# Patient Record
Sex: Male | Born: 1940 | Race: White | Hispanic: No | Marital: Married | Smoking: Former smoker
Health system: Southern US, Community
[De-identification: ages and names within clinical notes are randomized; demographics above are authoritative.]

## PROBLEM LIST (undated history)

## (undated) DIAGNOSIS — M25519 Pain in unspecified shoulder: Secondary | ICD-10-CM

## (undated) DIAGNOSIS — M549 Dorsalgia, unspecified: Secondary | ICD-10-CM

## (undated) DIAGNOSIS — M1990 Unspecified osteoarthritis, unspecified site: Secondary | ICD-10-CM

## (undated) DIAGNOSIS — K222 Esophageal obstruction: Secondary | ICD-10-CM

## (undated) DIAGNOSIS — C61 Malignant neoplasm of prostate: Secondary | ICD-10-CM

## (undated) DIAGNOSIS — M519 Unspecified thoracic, thoracolumbar and lumbosacral intervertebral disc disorder: Secondary | ICD-10-CM

## (undated) DIAGNOSIS — I10 Essential (primary) hypertension: Secondary | ICD-10-CM

## (undated) DIAGNOSIS — J42 Unspecified chronic bronchitis: Secondary | ICD-10-CM

## (undated) DIAGNOSIS — J189 Pneumonia, unspecified organism: Secondary | ICD-10-CM

## (undated) DIAGNOSIS — I739 Peripheral vascular disease, unspecified: Secondary | ICD-10-CM

## (undated) DIAGNOSIS — K922 Gastrointestinal hemorrhage, unspecified: Secondary | ICD-10-CM

## (undated) DIAGNOSIS — I442 Atrioventricular block, complete: Secondary | ICD-10-CM

## (undated) DIAGNOSIS — F32A Depression, unspecified: Secondary | ICD-10-CM

## (undated) DIAGNOSIS — M25551 Pain in right hip: Secondary | ICD-10-CM

## (undated) DIAGNOSIS — E785 Hyperlipidemia, unspecified: Secondary | ICD-10-CM

## (undated) DIAGNOSIS — R5383 Other fatigue: Secondary | ICD-10-CM

## (undated) DIAGNOSIS — I2510 Atherosclerotic heart disease of native coronary artery without angina pectoris: Secondary | ICD-10-CM

## (undated) DIAGNOSIS — Z87442 Personal history of urinary calculi: Secondary | ICD-10-CM

## (undated) DIAGNOSIS — F419 Anxiety disorder, unspecified: Secondary | ICD-10-CM

## (undated) DIAGNOSIS — F319 Bipolar disorder, unspecified: Secondary | ICD-10-CM

## (undated) DIAGNOSIS — K219 Gastro-esophageal reflux disease without esophagitis: Secondary | ICD-10-CM

## (undated) DIAGNOSIS — N529 Male erectile dysfunction, unspecified: Secondary | ICD-10-CM

## (undated) DIAGNOSIS — J8410 Pulmonary fibrosis, unspecified: Secondary | ICD-10-CM

## (undated) DIAGNOSIS — F329 Major depressive disorder, single episode, unspecified: Secondary | ICD-10-CM

## (undated) HISTORY — PX: CYSTOSCOPY: SUR368

## (undated) HISTORY — DX: Essential (primary) hypertension: I10

## (undated) HISTORY — DX: Gastrointestinal hemorrhage, unspecified: K92.2

## (undated) HISTORY — DX: Anxiety disorder, unspecified: F41.9

## (undated) HISTORY — DX: Male erectile dysfunction, unspecified: N52.9

## (undated) HISTORY — DX: Atrioventricular block, complete: I44.2

## (undated) HISTORY — DX: Dorsalgia, unspecified: M54.9

## (undated) HISTORY — DX: Unspecified thoracic, thoracolumbar and lumbosacral intervertebral disc disorder: M51.9

## (undated) HISTORY — PX: CORONARY ANGIOPLASTY: SHX604

## (undated) HISTORY — DX: Other fatigue: R53.83

## (undated) HISTORY — DX: Malignant neoplasm of prostate: C61

## (undated) HISTORY — DX: Gastro-esophageal reflux disease without esophagitis: K21.9

## (undated) HISTORY — DX: Hyperlipidemia, unspecified: E78.5

## (undated) HISTORY — DX: Unspecified osteoarthritis, unspecified site: M19.90

## (undated) HISTORY — DX: Pain in right hip: M25.551

## (undated) HISTORY — DX: Unspecified chronic bronchitis: J42

## (undated) HISTORY — DX: Depression, unspecified: F32.A

## (undated) HISTORY — DX: Atherosclerotic heart disease of native coronary artery without angina pectoris: I25.10

## (undated) HISTORY — PX: CORONARY ARTERY BYPASS GRAFT: SHX141

## (undated) HISTORY — DX: Esophageal obstruction: K22.2

## (undated) HISTORY — DX: Major depressive disorder, single episode, unspecified: F32.9

## (undated) HISTORY — DX: Pain in unspecified shoulder: M25.519

---

## 1991-05-26 DIAGNOSIS — J189 Pneumonia, unspecified organism: Secondary | ICD-10-CM

## 1991-05-26 HISTORY — DX: Pneumonia, unspecified organism: J18.9

## 1999-09-09 ENCOUNTER — Encounter: Payer: Self-pay | Admitting: Emergency Medicine

## 1999-09-09 ENCOUNTER — Inpatient Hospital Stay (HOSPITAL_COMMUNITY): Admission: EM | Admit: 1999-09-09 | Discharge: 1999-09-10 | Payer: Self-pay | Admitting: Emergency Medicine

## 2000-08-13 ENCOUNTER — Emergency Department (HOSPITAL_COMMUNITY): Admission: EM | Admit: 2000-08-13 | Discharge: 2000-08-13 | Payer: Self-pay | Admitting: Internal Medicine

## 2001-10-26 ENCOUNTER — Emergency Department (HOSPITAL_COMMUNITY): Admission: EM | Admit: 2001-10-26 | Discharge: 2001-10-26 | Payer: Self-pay | Admitting: Emergency Medicine

## 2001-10-26 ENCOUNTER — Encounter: Payer: Self-pay | Admitting: Emergency Medicine

## 2001-10-26 ENCOUNTER — Encounter (INDEPENDENT_AMBULATORY_CARE_PROVIDER_SITE_OTHER): Payer: Self-pay | Admitting: *Deleted

## 2004-01-15 ENCOUNTER — Encounter: Admission: RE | Admit: 2004-01-15 | Discharge: 2004-01-15 | Payer: Self-pay | Admitting: Internal Medicine

## 2005-01-05 ENCOUNTER — Ambulatory Visit: Payer: Self-pay | Admitting: Internal Medicine

## 2005-04-14 ENCOUNTER — Ambulatory Visit: Payer: Self-pay | Admitting: Internal Medicine

## 2005-08-18 ENCOUNTER — Ambulatory Visit: Payer: Self-pay | Admitting: Internal Medicine

## 2006-01-04 ENCOUNTER — Ambulatory Visit: Payer: Self-pay | Admitting: Internal Medicine

## 2006-01-04 LAB — CONVERTED CEMR LAB: PSA: 2.45 ng/mL

## 2006-01-12 ENCOUNTER — Ambulatory Visit: Payer: Self-pay | Admitting: Cardiology

## 2006-01-12 ENCOUNTER — Ambulatory Visit: Payer: Self-pay | Admitting: Internal Medicine

## 2006-01-12 ENCOUNTER — Encounter (INDEPENDENT_AMBULATORY_CARE_PROVIDER_SITE_OTHER): Payer: Self-pay | Admitting: *Deleted

## 2006-05-05 ENCOUNTER — Ambulatory Visit: Payer: Self-pay | Admitting: Internal Medicine

## 2006-05-11 ENCOUNTER — Ambulatory Visit: Payer: Self-pay | Admitting: Internal Medicine

## 2006-05-19 ENCOUNTER — Ambulatory Visit: Payer: Self-pay | Admitting: Internal Medicine

## 2007-02-28 ENCOUNTER — Encounter: Payer: Self-pay | Admitting: Internal Medicine

## 2007-02-28 DIAGNOSIS — Z87442 Personal history of urinary calculi: Secondary | ICD-10-CM

## 2007-02-28 DIAGNOSIS — F411 Generalized anxiety disorder: Secondary | ICD-10-CM | POA: Insufficient documentation

## 2007-02-28 DIAGNOSIS — I10 Essential (primary) hypertension: Secondary | ICD-10-CM

## 2007-02-28 DIAGNOSIS — J309 Allergic rhinitis, unspecified: Secondary | ICD-10-CM | POA: Insufficient documentation

## 2007-02-28 DIAGNOSIS — K219 Gastro-esophageal reflux disease without esophagitis: Secondary | ICD-10-CM | POA: Insufficient documentation

## 2007-02-28 DIAGNOSIS — M1990 Unspecified osteoarthritis, unspecified site: Secondary | ICD-10-CM | POA: Insufficient documentation

## 2007-02-28 DIAGNOSIS — F528 Other sexual dysfunction not due to a substance or known physiological condition: Secondary | ICD-10-CM

## 2007-02-28 DIAGNOSIS — E785 Hyperlipidemia, unspecified: Secondary | ICD-10-CM

## 2007-02-28 DIAGNOSIS — F329 Major depressive disorder, single episode, unspecified: Secondary | ICD-10-CM

## 2007-05-04 ENCOUNTER — Ambulatory Visit: Payer: Self-pay | Admitting: Internal Medicine

## 2007-05-04 ENCOUNTER — Encounter (INDEPENDENT_AMBULATORY_CARE_PROVIDER_SITE_OTHER): Payer: Self-pay | Admitting: *Deleted

## 2007-05-04 DIAGNOSIS — J4489 Other specified chronic obstructive pulmonary disease: Secondary | ICD-10-CM | POA: Insufficient documentation

## 2007-05-04 DIAGNOSIS — J449 Chronic obstructive pulmonary disease, unspecified: Secondary | ICD-10-CM

## 2007-05-04 DIAGNOSIS — M5137 Other intervertebral disc degeneration, lumbosacral region: Secondary | ICD-10-CM

## 2007-05-04 DIAGNOSIS — R5383 Other fatigue: Secondary | ICD-10-CM

## 2007-05-04 DIAGNOSIS — R5381 Other malaise: Secondary | ICD-10-CM

## 2007-05-05 ENCOUNTER — Encounter: Payer: Self-pay | Admitting: Internal Medicine

## 2007-05-05 DIAGNOSIS — R972 Elevated prostate specific antigen [PSA]: Secondary | ICD-10-CM | POA: Insufficient documentation

## 2007-05-09 ENCOUNTER — Encounter (INDEPENDENT_AMBULATORY_CARE_PROVIDER_SITE_OTHER): Payer: Self-pay | Admitting: *Deleted

## 2007-05-30 ENCOUNTER — Encounter: Payer: Self-pay | Admitting: Internal Medicine

## 2007-06-03 ENCOUNTER — Ambulatory Visit: Payer: Self-pay | Admitting: Internal Medicine

## 2007-06-08 ENCOUNTER — Encounter: Payer: Self-pay | Admitting: Internal Medicine

## 2007-06-08 ENCOUNTER — Ambulatory Visit: Payer: Self-pay

## 2007-06-20 ENCOUNTER — Ambulatory Visit: Payer: Self-pay | Admitting: Internal Medicine

## 2007-06-20 LAB — CONVERTED CEMR LAB
BUN: 8 mg/dL (ref 6–23)
Basophils Absolute: 0.1 10*3/uL (ref 0.0–0.1)
Basophils Relative: 1 % (ref 0.0–1.0)
CO2: 27 meq/L (ref 19–32)
Creatinine, Ser: 1.1 mg/dL (ref 0.4–1.5)
GFR calc Af Amer: 86 mL/min
HCT: 46.4 % (ref 39.0–52.0)
Hemoglobin: 15.6 g/dL (ref 13.0–17.0)
Lymphocytes Relative: 27 % (ref 12.0–46.0)
MCHC: 33.6 g/dL (ref 30.0–36.0)
Monocytes Absolute: 0.7 10*3/uL (ref 0.2–0.7)
Monocytes Relative: 12.2 % — ABNORMAL HIGH (ref 3.0–11.0)
Neutro Abs: 3.2 10*3/uL (ref 1.4–7.7)
Neutrophils Relative %: 56.1 % (ref 43.0–77.0)
Potassium: 4.2 meq/L (ref 3.5–5.1)
RDW: 12.4 % (ref 11.5–14.6)
Sodium: 141 meq/L (ref 135–145)

## 2007-06-22 ENCOUNTER — Inpatient Hospital Stay (HOSPITAL_BASED_OUTPATIENT_CLINIC_OR_DEPARTMENT_OTHER): Admission: RE | Admit: 2007-06-22 | Discharge: 2007-06-22 | Payer: Self-pay | Admitting: Cardiology

## 2007-06-22 ENCOUNTER — Ambulatory Visit: Payer: Self-pay | Admitting: Cardiology

## 2007-06-27 ENCOUNTER — Ambulatory Visit: Payer: Self-pay | Admitting: Internal Medicine

## 2007-08-15 ENCOUNTER — Ambulatory Visit: Payer: Self-pay | Admitting: Internal Medicine

## 2007-08-15 DIAGNOSIS — R079 Chest pain, unspecified: Secondary | ICD-10-CM | POA: Insufficient documentation

## 2007-08-18 ENCOUNTER — Encounter: Payer: Self-pay | Admitting: Internal Medicine

## 2007-08-26 ENCOUNTER — Ambulatory Visit: Payer: Self-pay | Admitting: Internal Medicine

## 2007-09-08 ENCOUNTER — Ambulatory Visit: Payer: Self-pay | Admitting: Internal Medicine

## 2007-09-08 LAB — CONVERTED CEMR LAB
ALT: 23 units/L (ref 0–53)
AST: 23 units/L (ref 0–37)
Cholesterol: 97 mg/dL (ref 0–200)
LDL Cholesterol: 50 mg/dL (ref 0–99)
VLDL: 18 mg/dL (ref 0–40)

## 2007-11-03 ENCOUNTER — Ambulatory Visit: Payer: Self-pay | Admitting: Internal Medicine

## 2007-11-03 LAB — CONVERTED CEMR LAB: PSA: 2.35 ng/mL (ref 0.10–4.00)

## 2008-01-31 ENCOUNTER — Ambulatory Visit: Payer: Self-pay | Admitting: Internal Medicine

## 2008-01-31 DIAGNOSIS — M25559 Pain in unspecified hip: Secondary | ICD-10-CM | POA: Insufficient documentation

## 2008-01-31 LAB — CONVERTED CEMR LAB
ALT: 22 units/L (ref 0–53)
AST: 22 units/L (ref 0–37)
Total CHOL/HDL Ratio: 3.4

## 2008-06-14 ENCOUNTER — Telehealth: Payer: Self-pay | Admitting: Internal Medicine

## 2008-06-18 ENCOUNTER — Ambulatory Visit: Payer: Self-pay | Admitting: Internal Medicine

## 2008-06-18 LAB — CONVERTED CEMR LAB
Alkaline Phosphatase: 70 units/L (ref 39–117)
Bilirubin, Direct: 0.1 mg/dL (ref 0.0–0.3)
Total Bilirubin: 1 mg/dL (ref 0.3–1.2)

## 2008-08-01 ENCOUNTER — Ambulatory Visit: Payer: Self-pay | Admitting: Internal Medicine

## 2008-08-01 DIAGNOSIS — M25519 Pain in unspecified shoulder: Secondary | ICD-10-CM | POA: Insufficient documentation

## 2008-08-01 DIAGNOSIS — IMO0002 Reserved for concepts with insufficient information to code with codable children: Secondary | ICD-10-CM | POA: Insufficient documentation

## 2008-08-01 DIAGNOSIS — M549 Dorsalgia, unspecified: Secondary | ICD-10-CM | POA: Insufficient documentation

## 2008-08-06 ENCOUNTER — Telehealth (INDEPENDENT_AMBULATORY_CARE_PROVIDER_SITE_OTHER): Payer: Self-pay | Admitting: *Deleted

## 2008-08-07 ENCOUNTER — Telehealth: Payer: Self-pay | Admitting: Internal Medicine

## 2008-08-08 ENCOUNTER — Encounter: Payer: Self-pay | Admitting: Internal Medicine

## 2008-10-05 ENCOUNTER — Ambulatory Visit: Payer: Self-pay | Admitting: Internal Medicine

## 2008-10-17 ENCOUNTER — Emergency Department (HOSPITAL_BASED_OUTPATIENT_CLINIC_OR_DEPARTMENT_OTHER): Admission: EM | Admit: 2008-10-17 | Discharge: 2008-10-17 | Payer: Self-pay | Admitting: Emergency Medicine

## 2008-10-17 ENCOUNTER — Telehealth: Payer: Self-pay | Admitting: Internal Medicine

## 2009-01-25 ENCOUNTER — Inpatient Hospital Stay (HOSPITAL_COMMUNITY): Admission: EM | Admit: 2009-01-25 | Discharge: 2009-01-27 | Payer: Self-pay | Admitting: Emergency Medicine

## 2009-01-25 ENCOUNTER — Ambulatory Visit: Payer: Self-pay | Admitting: Cardiovascular Disease

## 2009-01-26 ENCOUNTER — Encounter: Payer: Self-pay | Admitting: Cardiovascular Disease

## 2009-01-29 ENCOUNTER — Telehealth (INDEPENDENT_AMBULATORY_CARE_PROVIDER_SITE_OTHER): Payer: Self-pay | Admitting: *Deleted

## 2009-01-29 ENCOUNTER — Telehealth: Payer: Self-pay | Admitting: Internal Medicine

## 2009-01-30 ENCOUNTER — Ambulatory Visit: Payer: Self-pay | Admitting: Physician Assistant

## 2009-01-30 ENCOUNTER — Ambulatory Visit: Payer: Self-pay | Admitting: Internal Medicine

## 2009-01-30 DIAGNOSIS — K920 Hematemesis: Secondary | ICD-10-CM

## 2009-01-30 DIAGNOSIS — I2510 Atherosclerotic heart disease of native coronary artery without angina pectoris: Secondary | ICD-10-CM | POA: Insufficient documentation

## 2009-01-30 DIAGNOSIS — K922 Gastrointestinal hemorrhage, unspecified: Secondary | ICD-10-CM

## 2009-01-30 DIAGNOSIS — R112 Nausea with vomiting, unspecified: Secondary | ICD-10-CM

## 2009-01-30 LAB — CONVERTED CEMR LAB
Basophils Relative: 1 % (ref 0.0–3.0)
Eosinophils Relative: 3.2 % (ref 0.0–5.0)
HCT: 45.3 % (ref 39.0–52.0)
Hemoglobin: 15.5 g/dL (ref 13.0–17.0)
MCHC: 34.3 g/dL (ref 30.0–36.0)
MCV: 97 fL (ref 78.0–100.0)
Monocytes Absolute: 0.6 10*3/uL (ref 0.1–1.0)
Neutro Abs: 4.1 10*3/uL (ref 1.4–7.7)
Neutrophils Relative %: 68 % (ref 43.0–77.0)
RBC: 4.67 M/uL (ref 4.22–5.81)
WBC: 6.1 10*3/uL (ref 4.5–10.5)

## 2009-01-31 ENCOUNTER — Ambulatory Visit: Payer: Self-pay | Admitting: Internal Medicine

## 2009-02-07 DIAGNOSIS — Z9861 Coronary angioplasty status: Secondary | ICD-10-CM | POA: Insufficient documentation

## 2009-02-07 DIAGNOSIS — Z951 Presence of aortocoronary bypass graft: Secondary | ICD-10-CM

## 2009-02-08 ENCOUNTER — Ambulatory Visit: Payer: Self-pay | Admitting: Internal Medicine

## 2009-02-11 ENCOUNTER — Encounter: Payer: Self-pay | Admitting: Internal Medicine

## 2009-02-26 ENCOUNTER — Telehealth: Payer: Self-pay | Admitting: Internal Medicine

## 2009-03-04 ENCOUNTER — Ambulatory Visit: Payer: Self-pay | Admitting: Cardiovascular Disease

## 2009-03-04 ENCOUNTER — Encounter (INDEPENDENT_AMBULATORY_CARE_PROVIDER_SITE_OTHER): Payer: Self-pay

## 2009-03-04 DIAGNOSIS — I70219 Atherosclerosis of native arteries of extremities with intermittent claudication, unspecified extremity: Secondary | ICD-10-CM | POA: Insufficient documentation

## 2009-03-06 ENCOUNTER — Ambulatory Visit: Payer: Self-pay | Admitting: Cardiovascular Disease

## 2009-03-06 ENCOUNTER — Ambulatory Visit (HOSPITAL_COMMUNITY): Admission: RE | Admit: 2009-03-06 | Discharge: 2009-03-06 | Payer: Self-pay | Admitting: Cardiovascular Disease

## 2009-04-02 ENCOUNTER — Ambulatory Visit: Payer: Self-pay | Admitting: Internal Medicine

## 2009-04-02 DIAGNOSIS — K260 Acute duodenal ulcer with hemorrhage: Secondary | ICD-10-CM | POA: Insufficient documentation

## 2009-04-02 DIAGNOSIS — K210 Gastro-esophageal reflux disease with esophagitis: Secondary | ICD-10-CM

## 2009-04-15 ENCOUNTER — Ambulatory Visit: Payer: Self-pay | Admitting: Internal Medicine

## 2009-04-16 ENCOUNTER — Telehealth: Payer: Self-pay | Admitting: Internal Medicine

## 2009-05-03 ENCOUNTER — Ambulatory Visit: Payer: Self-pay | Admitting: Internal Medicine

## 2009-05-06 ENCOUNTER — Encounter: Payer: Self-pay | Admitting: Internal Medicine

## 2009-05-06 ENCOUNTER — Telehealth: Payer: Self-pay | Admitting: Internal Medicine

## 2009-05-06 LAB — CONVERTED CEMR LAB
LDL Cholesterol: 44 mg/dL (ref 0–99)
Total CHOL/HDL Ratio: 4

## 2009-06-14 ENCOUNTER — Encounter: Payer: Self-pay | Admitting: Internal Medicine

## 2009-06-18 ENCOUNTER — Encounter (INDEPENDENT_AMBULATORY_CARE_PROVIDER_SITE_OTHER): Payer: Self-pay | Admitting: *Deleted

## 2009-06-28 ENCOUNTER — Telehealth: Payer: Self-pay | Admitting: Internal Medicine

## 2009-07-08 ENCOUNTER — Telehealth (INDEPENDENT_AMBULATORY_CARE_PROVIDER_SITE_OTHER): Payer: Self-pay | Admitting: *Deleted

## 2009-09-20 ENCOUNTER — Ambulatory Visit: Payer: Self-pay | Admitting: Internal Medicine

## 2009-09-30 ENCOUNTER — Encounter: Payer: Self-pay | Admitting: Internal Medicine

## 2009-12-27 ENCOUNTER — Ambulatory Visit: Payer: Self-pay | Admitting: Internal Medicine

## 2009-12-30 LAB — CONVERTED CEMR LAB
Cholesterol: 94 mg/dL (ref 0–200)
HDL: 26 mg/dL — ABNORMAL LOW (ref >39.00)
VLDL: 18.6 mg/dL (ref 0.0–40.0)

## 2010-03-10 ENCOUNTER — Encounter: Payer: Self-pay | Admitting: Internal Medicine

## 2010-03-11 ENCOUNTER — Ambulatory Visit: Payer: Self-pay | Admitting: Internal Medicine

## 2010-03-11 LAB — CONVERTED CEMR LAB
Albumin: 3.8 g/dL (ref 3.5–5.2)
Alkaline Phosphatase: 79 units/L (ref 39–117)
Basophils Absolute: 0.1 10*3/uL (ref 0.0–0.1)
Bilirubin, Direct: 0.1 mg/dL (ref 0.0–0.3)
CO2: 25 meq/L (ref 19–32)
Calcium: 9.1 mg/dL (ref 8.4–10.5)
Creatinine, Ser: 1.4 mg/dL (ref 0.4–1.5)
Eosinophils Absolute: 0.2 10*3/uL (ref 0.0–0.7)
Glucose, Bld: 82 mg/dL (ref 70–99)
HDL: 22.9 mg/dL — ABNORMAL LOW (ref >39.00)
Lymphocytes Relative: 22.4 % (ref 12.0–46.0)
MCHC: 34.1 g/dL (ref 30.0–36.0)
Neutro Abs: 5 10*3/uL (ref 1.4–7.7)
Neutrophils Relative %: 61.1 % (ref 43.0–77.0)
PSA: 1.47 ng/mL (ref 0.10–4.00)
RDW: 13 % (ref 11.5–14.6)
Specific Gravity, Urine: >=1.03 (ref 1.000–1.030)
Triglycerides: 183 mg/dL — ABNORMAL HIGH (ref 0.0–149.0)
Urine Glucose: NEGATIVE mg/dL
Urobilinogen, UA: 1 (ref 0.0–1.0)
VLDL: 36.6 mg/dL (ref 0.0–40.0)

## 2010-03-14 ENCOUNTER — Encounter: Payer: Self-pay | Admitting: Internal Medicine

## 2010-03-14 ENCOUNTER — Telehealth: Payer: Self-pay | Admitting: Internal Medicine

## 2010-03-17 ENCOUNTER — Telehealth: Payer: Self-pay | Admitting: Internal Medicine

## 2010-04-01 ENCOUNTER — Encounter (INDEPENDENT_AMBULATORY_CARE_PROVIDER_SITE_OTHER): Payer: Self-pay | Admitting: *Deleted

## 2010-06-22 LAB — CONVERTED CEMR LAB
ALT: 20 units/L (ref 0–53)
AST: 22 units/L (ref 0–37)
Albumin: 3.9 g/dL (ref 3.5–5.2)
BUN: 10 mg/dL (ref 6–23)
BUN: 14 mg/dL (ref 6–23)
Basophils Absolute: 0 10*3/uL (ref 0.0–0.1)
Bilirubin, Direct: 0.2 mg/dL (ref 0.0–0.3)
CO2: 25 meq/L (ref 19–32)
CO2: 25 meq/L (ref 19–32)
Calcium: 9.8 mg/dL (ref 8.4–10.5)
Calcium: 9.9 mg/dL (ref 8.4–10.5)
Chloride: 103 meq/L (ref 96–112)
Chloride: 108 meq/L (ref 96–112)
Cholesterol: 103 mg/dL (ref 0–200)
Creatinine, Ser: 1.1 mg/dL (ref 0.4–1.5)
Eosinophils Absolute: 0.2 10*3/uL (ref 0.0–0.6)
Eosinophils Absolute: 0.2 10*3/uL (ref 0.0–0.7)
Eosinophils Relative: 2.5 % (ref 0.0–5.0)
GFR calc non Af Amer: 71 mL/min
Glucose, Bld: 102 mg/dL — ABNORMAL HIGH (ref 70–99)
Glucose, Bld: 91 mg/dL (ref 70–99)
Glucose, Bld: 94 mg/dL (ref 70–99)
HCT: 44.8 % (ref 39.0–52.0)
HCT: 45.6 % (ref 39.0–52.0)
Hemoglobin: 15.3 g/dL (ref 13.0–17.0)
LDL Cholesterol: 101 mg/dL — ABNORMAL HIGH (ref 0–99)
Lymphocytes Relative: 23.9 % (ref 12.0–46.0)
Lymphs Abs: 1.6 10*3/uL (ref 0.7–4.0)
MCHC: 34.1 g/dL (ref 30.0–36.0)
MCV: 95 fL (ref 78.0–100.0)
Neutro Abs: 4.6 10*3/uL (ref 1.4–7.7)
Neutrophils Relative %: 65.8 % (ref 43.0–77.0)
Platelets: 220 10*3/uL (ref 150.0–400.0)
Platelets: 271 10*3/uL (ref 150–400)
Potassium: 4.1 meq/L (ref 3.5–5.1)
Prothrombin Time: 10.9 s (ref 9.1–11.7)
RBC: 4.81 M/uL (ref 4.22–5.81)
RDW: 11.9 % (ref 11.5–14.6)
RDW: 12.7 % (ref 11.5–14.6)
Sodium: 138 meq/L (ref 135–145)
Total Bilirubin: 0.8 mg/dL (ref 0.3–1.2)
Total CHOL/HDL Ratio: 5.8
Triglycerides: 114 mg/dL (ref 0.0–149.0)
Triglycerides: 120 mg/dL (ref 0–149)
VLDL: 24 mg/dL (ref 0–40)
WBC: 7.5 10*3/uL (ref 4.5–10.5)

## 2010-06-26 NOTE — Progress Notes (Signed)
Summary: REFILL  Phone Note Refill Request Message from:  Patient on June 28, 2009 1:16 PM  Refills Requested: Medication #1:  AMLODIPINE BESYLATE 5 MG TABS take one by mouth once daily  Medication #2:  LISINOPRIL 10 MG  TABS 1 by mouth qd  Medication #3:  SIMVASTATIN 40 MG TABS Take one tablet by mouth daily at bedtime SEND TO [facility] [phone] FAX [phone] NEED 90 DAY SUPPLY  Initial call taken by: Delsa Sale,  June 28, 2009 1:19 PM  Follow-up for Phone Call        Rx faxed to pharmacy Follow-up by: Lynden Ang,  June 28, 2009 1:43 PM    Prescriptions: SIMVASTATIN 40 MG TABS (SIMVASTATIN) Take one tablet by mouth daily at bedtime  #90 x 3   Entered by:   Lynden Ang   Authorized by:   Annye Rusk, MD, [facility]   Signed by:   Lynden Ang on 06/28/2009   Method used:   Faxed to ...       [facility] (mail-order)       [place], [place]  [id number]       Ph: [phone]       Fax: [phone]   RxID:   [id number] AMLODIPINE BESYLATE 5 MG TABS (AMLODIPINE BESYLATE) take one by mouth once daily  #90 x 3   Entered by:   Lynden Ang   Authorized by:   Annye Rusk, MD, [facility]   Signed by:   Lynden Ang on 06/28/2009   Method used:   Faxed to ...       [facility] (mail-order)       [place], [place]  [id number]       Ph: [phone]       Fax: [phone]   RxID:   [id number] LISINOPRIL 10 MG  TABS (LISINOPRIL) 1 by mouth qd  #90 x 3   Entered by:   Lynden Ang   Authorized by:   Annye Rusk, MD, [facility]   Signed by:   Lynden Ang on 06/28/2009   Method used:   Faxed to ...       [facility] (mail-order)       [address] [place], [place]  [id number]       Ph: [phone]       Fax: [phone]   RxID:   [id number]

## 2010-06-26 NOTE — Letter (Signed)
Summary: Pre Visit Letter Revised  [facility]  [place], [place] [address]   Phone: [phone]  Fax: [phone]        04/01/2010 MRN: [id number] HERCULES HASLER [address] [place],   [address]             Procedure Date:  05-23-10   Welcome to the Gastroenterology Division at [facility].    You are scheduled to see a nurse for your pre-procedure visit on 05-09-10 at 10:30a.m. on the 3rd floor at [facility], [place] [employer].  We ask that you try to arrive at our office 15 minutes prior to your appointment time to allow for check-in.  Please take a minute to review the attached form.  If you answer "Yes" to one or more of the questions on the first page, we ask that you call the person listed at your earliest opportunity.  If you answer "No" to all of the questions, please complete the rest of the form and bring it to your appointment.    Your nurse visit will consist of discussing your medical and surgical history, your immediate family medical history, and your medications.   If you are unable to list all of your medications on the form, please bring the medication bottles to your appointment and we will list them.  We will need to be aware of both prescribed and over the counter drugs.  We will need to know exact dosage information as well.    Please be prepared to read and sign documents such as consent forms, a financial agreement, and acknowledgement forms.  If necessary, and with your consent, a friend or relative is welcome to sit-in on the nurse visit with you.  Please bring your insurance card so that we may make a copy of it.  If your insurance requires a referral to see a specialist, please bring your referral form from your primary care physician.  No co-pay is required for this nurse visit.     If you cannot keep your appointment, please call [phone] to cancel or reschedule prior to your appointment date.  This allows  [place] the opportunity to schedule an appointment for another patient in need of care.    Thank you for choosing [facility] for your medical needs.  We appreciate the opportunity to care for you.  Please visit [place] at our website  to learn more about our practice.  Sincerely, The Gastroenterology Division

## 2010-06-26 NOTE — Progress Notes (Signed)
Summary: Records request from [place] for records received from [place] [facility] [place]. Request forwarded to [employer]. Valda Favia  July 08, 2009 2:48 PM

## 2010-06-26 NOTE — Progress Notes (Signed)
Summary: AUTHORIZATION  Phone Note From Other Clinic   Summary of Call: Pt was approved for Post vac med supply comp request. Auth # for CPT [id number] is [id number] Initial call taken by: Charlsie Quest, [place],  March 17, 2010 5:31 PM

## 2010-06-26 NOTE — Progress Notes (Signed)
Summary: Omeprazole  Phone Note Call from Patient Call back at Home Phone [phone]   Caller: wife, [name] Call For: Dr. Henrene Pastor Reason for Call: Refill Medication Summary of Call: Omeprazole... 90 day supply... [facility], fax- [phone] Initial call taken by: Lucien Mons,  June 28, 2009 1:27 PM  Follow-up for Phone Call        Left message on patients machine to call back.  Follow-up by: Bernita Buffy CMA Deborra Medina),  July 05, 2009 9:09 AM  Additional Follow-up for Phone Call Additional follow up Details #1::        Pt. called back and he now has new insurance plan which is [employer] and needs rx. faxed to the new place that was put on the message for a 90 day supply with 3 refills. Additional Follow-up by: Abel Presto RN,  July 05, 2009 9:35 AM    Additional Follow-up for Phone Call Additional follow up Details #2::    rx sent Follow-up by: Bernita Buffy CMA ([place]),  July 05, 2009 2:28 PM  New/Updated Medications: OMEPRAZOLE 40 MG CPDR (OMEPRAZOLE) take one by mouth once daily Prescriptions: OMEPRAZOLE 40 MG CPDR (OMEPRAZOLE) take one by mouth once daily  #90 x 3   Entered by:   Bernita Buffy CMA ([place])   Authorized by:   Irene Shipper MD   Signed by:   Bernita Buffy CMA ([place]) on 07/05/2009   Method used:   Faxed to ...       [facility] (mail-order)       [address] [place], [place]  [id number]       Ph: [phone]       Fax: [phone]   RxID:   [id number]

## 2010-06-26 NOTE — Progress Notes (Signed)
Summary: RX request  Phone Note Call from Patient Call back at Home Phone [phone]   Caller: Spouse Summary of Call: pt's spouse called requesting multiple refills fromMD. I advised spouse that pt was overdue to CPX, and after reviewwing med list there were medications that were removed or changed. pt agreed to schedule CPX with possible med fill prior. spouse transferred Initial call taken by: Crissie Sickles, [place],  June 28, 2009 2:49 PM

## 2010-06-26 NOTE — Letter (Signed)
Summary: Appointment - Reminder [place], [place]  [address] [place]   [place], [place] [address]   Phone: [phone]  Fax: [phone]     June 18, 2009 MRN: [id number]   [address] [place], [place]  [address]   Dear Mr. Barcus,  Our records indicate that it is time to schedule a follow-up appointment with Dr. Harrington Challenger. It is very important that we reach you to schedule this appointment. We look forward to participating in your health care needs. Please contact us at the number listed above at your earliest convenience to schedule your appointment.  If you are unable to make an appointment at this time, give [place] a call so we can update our records.   Sincerely,    Darnell Level [facility] Scheduling Team

## 2010-06-26 NOTE — Letter (Signed)
Summary: Generic Letter  [facility]   [place], [place] [address]   Phone: [phone]  Fax: [phone]    03/14/2010  [place],   [address]  Dear Mr. Fauver,       This letter for purposes of medical necessity, in that  you should have the Post-T-Vac device approved, due to the   fact of ongoing permanent organic erectile dysfunction, and  inability to correct with oral medications.      Sincerely,   Cathlean Cower MD  Appended Document: Generic Letter Faxed - see phone note

## 2010-06-26 NOTE — Assessment & Plan Note (Signed)
Summary: 6 month rov/sl   Referring Provider:  Dorris Carnes, MD Primary Provider:  Cathlean Cower, MD  CC:  check up.  History of Present Illness:  [name] is a 70 year old gentleman. He has a history of coronary artery disease.  I last saw the patient back in September of last year.  Last cardiac catheterization (11/2008) showed 100% left main RCA was diffusely diseased 50-60% proximal 50% mid. Vessel is occluded after the PDA, SVG RCA was occluded ostially, SVG to ramus was ectatic with 30% blockage in the midsection. SVG to diagonal was ectatic 40% lesion. Diagonal was patent. Upper branch had a 99% stenosis, but was a small vessel. There were left to right collaterals. Left subclavian tortuous. No flow-limiting plaque, LIMA to LAD was patent. LAD was small. Abdominal aortogram showed patent renal arteries. 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 Since seen, he denies chest pains, no palpitations.  No shortness of breath.  Current Medications (verified): 1)  Aspirin 81 Mg  Tabs (Aspirin) .Marland Kitchen.. 1 Tab Once Daily 2)  Lisinopril 10 Mg  Tabs (Lisinopril) .Marland Kitchen.. 1 By Mouth Qd 3)  Alprazolam 0.25 Mg  Tabs (Alprazolam) .Marland Kitchen.. 1 By Mouth Once Daily As Needed 4)  Fluticasone Propionate 50 Mcg/act  Susp (Fluticasone Propionate) .... 2 Spray/side Qd 5)  Simvastatin 40 Mg Tabs (Simvastatin) .... Take One Tablet By Mouth Daily At Bedtime 6)  Amlodipine Besylate 5 Mg Tabs (Amlodipine Besylate) .... Take One By Mouth Once Daily 7)  Fish Oil 1000 Mg Caps (Omega-3 Fatty Acids) .... Take One By Mouth Once Daily 8)  Vicodin 5-500 Mg Tabs (Hydrocodone-Acetaminophen) .... As Needed 9)  Celexa 20 Mg Tabs (Citalopram Hydrobromide) .Marland Kitchen.. 1 Tab Once Daily 10)  Omeprazole 40 Mg Cpdr (Omeprazole) .... Take One By Mouth Once Daily 11)  Niaspan 1000 Mg Cr-Tabs (Niacin (Antihyperlipidemic)) .... Take One Half A Tablet  Every Day For 2 Weeks Then 1 Tablet Every Day ( Take 1 47maspirin 30 Minutes Prior To Niaspan )  Allergies: No Known Drug Allergies  Past History:  Past medical, surgical, family and social histories (including risk factors) reviewed, and no changes noted (except as noted below).  Past Medical History: Reviewed history from 04/15/2009 and no changes required. 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 father died with MI No FH of Colon Cancer:  Social History: Reviewed history from 01/30/2009 and no changes required. Former Smoker Alcohol use-no Married Illicit Drug Use - no Patient does not get regular exercise.  Daily Caffeine Use 3 cups a day Occupation: retired married  Review of Systems  All systems reviewed.  Negative to the above  problem.  Vital Signs:  Patient profile:   70 year old male Height:      73 inches Weight:      231 pounds BMI:     30.59 Pulse rate:   60 / minute Resp:     14 per minute BP sitting:   142 / 87  (left arm)  Vitals Entered By: Burnett Kanaris (September 20, 2009 11:36 AM)  Physical Exam  Additional Exam:  Patient is in NAD HEENT:  Normocephalic, atraumatic. EOMI, PERRLA.  Neck: JVP is normal. No thyromegaly. No bruits.  Lungs: clear to auscultation. No rales no wheezes.  Heart: Regular rate and rhythm. Normal S1, S2. No S3.   No significant murmurs. PMI not displaced.  Abdomen:  Supple, nontender. Normal bowel sounds. No masses. No hepatomegaly.  Extremities:   Good distal pulses throughout. No lower extremity edema.  Musculoskeletal :moving all extremities.  Neuro:   alert and oriented x3.    Impression & Recommendations:  Problem # 1:  CORONARY ARTERY DISEASE (ICD-414.00) Doing well.  No chest pains.  Keep on same regimen.  Problem # 2:  HYPERLIPIDEMIA (CLT-198.4) Keep on current regimen.  F/u with labs. Orders: TLB-Lipid Panel (80061-LIPID) TLB-AST (SGOT) (84450-SGOT)  Problem # 3:  HYPERTENSION (ICD-401.9) Keep on same regimen.  Other Orders: EKG w/ Interpretation (93000) TLB-BMP (Basic Metabolic Panel-BMET) (24299-QSYHNPM)  Patient Instructions: 1)  Your physician recommends that you return for lab work in:lab work today...we will call you with results 2)  Your physician wants you to follow-up in: Dec 2011  You will receive a reminder letter in the mail two months in advance. If you don't receive a letter, please call our office to schedule the follow-up appointment. Prescriptions: ALPRAZOLAM 0.25 MG  TABS (ALPRAZOLAM) 1 by mouth once daily as needed  #20 x 0   Entered by:   Francetta Found, RN, BSN   Authorized by:   Annye Rusk, MD, [facility]   Signed by:   Francetta Found, RN, BSN on 09/20/2009   Method used:   Print then Give to Patient   RxID:    [id number]   Appended Document: 6 month rov/sl EKG:  NSR.  60 bpm.   First degree AV block  PR. 318 msec.  Occasional PAC, PVC.

## 2010-06-26 NOTE — Medication Information (Signed)
Summary: Vacuum Erection Device/Pos-T-Vac   Vacuum Erection Device/Pos-T-Vac   Imported By: Phillis Knack 03/15/2010 10:58:11  _____________________________________________________________________  External Attachment:    Type:   Image     Comment:   External Document

## 2010-06-26 NOTE — Assessment & Plan Note (Signed)
Summary: follow up to refill rx-lb   Vital Signs:  Patient profile:   70 year old male Height:      72 inches Weight:      239.13 pounds BMI:     32.55 O2 Sat:      95 % on Room air Temp:     97.8 degrees F oral Pulse rate:   62 / minute BP sitting:   112 / 68  (left arm) Cuff size:   large  Vitals Entered By: Shirlean Mylar Ewing CMA Deborra Medina) (March 11, 2010 2:48 PM)  O2 Flow:  Room air  CC: followup, refill/RE   Primary Care Provider:  Cathlean Cower, MD  CC:  followup and refill/RE.  History of Present Illness: overall doing well, Pt denies CP, worsening sob, doe, wheezing, orthopnea, pnd, worsening LE edema, palps, dizziness or syncope  Pt denies new neuro symptoms such as headache, facial or extremity weakness   Pt denies polydipsia, polyuria, or low sugar symptoms.  Overall good compliance with meds, trying to follow low chol, DM diet, wt stable, little excercise however .  Needs med refills. Anxeity stable , without worsening depressive symptoms, suidical ideation, panic.  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) 10)  Percutaneous Transluminal Coronary Angioplasty, Hx of  (ICD-V45.82) 11)  Severe Bilateral Iliac Disease  () 12)  Presyncopal  () 13)  Saphenous Vein Graft Angiography X3  () 14)  Gi Bleed  (ICD-578.9) 15)  Nausea and Vomiting  (ICD-787.01) 16)  Hematemesis  (ICD-578.0) 17)  Abrasion, Knee, Left  (ICD-916.0) 18)  Pain in Joint, Shoulder Region  (ICD-719.41) 19)  Chest Pain  (ICD-786.50) 20)  Back Pain  (ICD-724.5) 21)  Hip Pain, Right   (ICD-719.45) 22)  Chest Pain, Left  (ICD-786.50) 23)  Psa, Increased  (ICD-790.93) 24)  Disc Disease, Lumbar  (ICD-722.52) 25)  Special Screening Malignant Neoplasm of Prostate  (ICD-V76.44) 26)  Fatigue  (ICD-780.79) 27)  Preventive Health Care  (ICD-V70.0) 28)  COPD  (ICD-496) 29)  Renal Calculus, Hx of  (ICD-V13.01) 30)  Osteoarthritis  (ICD-715.90) 31)  Depression  (ICD-311) 32)  Anxiety  (ICD-300.00) 33)  Erectile Dysfunction  (ICD-302.72) 34)  Gerd  (ICD-530.81) 35)  Allergic Rhinitis  (ICD-477.9)  Medications Prior to Update: 1)  Aspirin 81 Mg  Tabs (Aspirin) .Marland Kitchen.. 1 Tab Once Daily 2)  Lisinopril 10 Mg  Tabs (Lisinopril) .Marland Kitchen.. 1 By Mouth Qd 3)  Alprazolam 0.25 Mg  Tabs (Alprazolam) .Marland Kitchen.. 1 By Mouth Once Daily As Needed 4)  Fluticasone Propionate 50 Mcg/act  Susp (Fluticasone Propionate) .... 2 Spray/side Qd 5)  Simvastatin 40 Mg Tabs (Simvastatin) .... Take One Tablet By Mouth Daily At Bedtime 6)  Amlodipine Besylate 5 Mg Tabs (Amlodipine Besylate) .... Take One By Mouth Once Daily 7)  Fish Oil 1000 Mg Caps (Omega-3 Fatty Acids) .... Take One By Mouth Once Daily 8)  Vicodin 5-500 Mg Tabs (Hydrocodone-Acetaminophen) .... As Needed 9)  Celexa 20 Mg Tabs (Citalopram Hydrobromide) .Marland Kitchen.. 1 Tab Once Daily 10)  Omeprazole 40 Mg Cpdr (Omeprazole) .... Take One By Mouth Once Daily 11)  Niaspan 1000 Mg Cr-Tabs (Niacin (Antihyperlipidemic)) .... Take One and One Half Tabs Every Day ( Take 1 40m Aspirin 30 Minutes Prior To Niaspan )  Current Medications (verified): 1)  Aspirin 81 Mg  Tabs (Aspirin) ..Marland Kitchen. 1 Tab Once Daily 2)  Lisinopril 10 Mg  Tabs (Lisinopril) ..Marland Kitchen. 1 By Mouth Qd 3)  Alprazolam 0.25 Mg  Tabs (Alprazolam) ..Marland Kitchen. 1 By Mouth Once Daily As Needed 4)  Fluticasone Propionate 50 Mcg/act  Susp (Fluticasone Propionate) .... 2 Spray/side Qd 5)  Simvastatin 40 Mg Tabs (Simvastatin) .... Take One Tablet By Mouth Daily At Bedtime 6)  Amlodipine Besylate 5 Mg Tabs (Amlodipine Besylate) ....  Take One By Mouth Once Daily 7)  Fish Oil 1000 Mg Caps (Omega-3 Fatty Acids) .... Take One By Mouth Once Daily 8)  Celexa 20 Mg Tabs (Citalopram Hydrobromide) ..Marland Kitchen. 1 Tab Once Daily 9)  Omeprazole 40 Mg Cpdr (Omeprazole) .... Take One By Mouth Once Daily  Allergies (verified): No Known Drug Allergies  Past History:  Past Surgical History: Last updated: 02/07/2009 Current Problems:  PERCUTANEOUS TRANSLUMINAL CORONARY ANGIOPLASTY, HX OF (ICD-V45.82) 06/03/2007 & 01/25/2009 CORONARY ARTERY BYPASS GRAFT, HX OF (ICD-V45.81) 1994  Family History: Last updated: 0October 05, 2010father died with MI No FH of Colon Cancer:  Social History: Last updated: 010/05/10Former Smoker Alcohol use-no Married Illicit Drug Use - no Patient does not get regular exercise.  Daily Caffeine Use 3 cups a day Occupation: retired married  Risk Factors: Exercise: no (010-09-2008  Risk Factors: Smoking Status: quit (05/04/2007)  Past Medical History: Current Problems:  PVOT * PRESYNCOPAL GI BLEED (ICD-578.9) CORONARY ARTERY DISEASE (ICD-414.00) NAUSEA AND VOMITING (ICD-787.01) HEMATEMESIS (ICD-578.0) ABRASION, KNEE, LEFT (ICD-916.0) PAIN IN JOINT, SHOULDER REGION (ICD-719.41) CHEST PAIN (ICD-786.50) BACK PAIN (ICD-724.5) HIP PAIN, RIGHT (ICD-719.45) CHEST PAIN, LEFT (ICD-786.50) PSA, INCREASED (ICD-790.93) DISC DISEASE, LUMBAR (ICD-722.52) SPECIAL SCREENING MALIGNANT NEOPLASM OF PROSTATE (ICD-V76.44) FATIGUE (ICD-780.79) PREVENTIVE HEALTH CARE (ICD-V70.0) COPD (ICD-496) RENAL CALCULUS, HX OF (ICD-V13.01) OSTEOARTHRITIS (ICD-715.90) HYPERTENSION (ICD-401.9) DEPRESSION (ICD-311) ANXIETY (ICD-300.00) ERECTILE DYSFUNCTION (ICD-302.72) HYPERLIPIDEMIA (ICD-272.4) GERD CORONARY ARTERY DISEASE (ICD-414.00) ALLERGIC RHINITIS (ICD-477.9) ULCERATIVE ESOPHAGITIS PEPTIC STRICTURE Peptic Ulcer Disease  Review of Systems  The patient denies anorexia, fever, vision loss, decreased hearing,  hoarseness, chest pain, syncope, dyspnea on exertion, peripheral edema, prolonged cough, headaches, hemoptysis, abdominal pain, melena, hematochezia, severe indigestion/heartburn, hematuria, muscle weakness, suspicious skin lesions, transient blindness, difficulty walking, depression, unusual weight change, abnormal bleeding, enlarged lymph nodes, and angioedema.         all otherwise negative per pt -    Physical Exam  General:  Well developed, well nourished, in no acute distress. Head:  normocephalic and atraumatic Eyes:  PERRLA, no icterus. Ears:  R ear normal and L ear normal.   Nose:  No deformity, discharge,  or lesions. Mouth:  No deformity or lesions,  Neck:  JVP is normal  No bruits. Lungs:  normal respiratory effort and normal breath sounds.   Heart:  RRR.  S1, S2.  No S3.  No murmurs. Abdomen:  soft, non-tender, and normal bowel sounds.   Msk:  no joint tenderness and no joint swelling.   Extremities:  no edema, no erythema  Neurologic:  cranial nerves II-XII intact and strength normal in all extremities.   Skin:  color normal and no rashes.   Psych:  not depressed appearing and slightly anxious.     Impression & Recommendations:  Problem # 1:  Preventive Health Care (ICD-V70.0) Overall doing well, age appropriate education and counseling updated,  referral for preventive services and immunizations addressed, dietary counseling and smoking status adressed , most recent labs reviewed with pt, ecg declined, to order current labs Orders: Gastroenterology Referral (GI) TLB-BMP (Basic Metabolic Panel-BMET) (33354-TGYBWLS) TLB-CBC Platelet - w/Differential (85025-CBCD) TLB-Hepatic/Liver Function Pnl (80076-HEPATIC) TLB-Lipid Panel (80061-LIPID) TLB-PSA (Prostate Specific Antigen) (84153-PSA) TLB-TSH (Thyroid Stimulating Hormone) (84443-TSH) TLB-Udip ONLY (81003-UDIP)  Problem # 2:  ANXIETY (ICD-300.00)  His updated medication list for this problem includes:    Alprazolam  0.25 Mg Tabs (Alprazolam) .Marland Kitchen... 1 by mouth once daily as needed    Celexa 20 Mg Tabs (Citalopram hydrobromide) .Marland Kitchen... 1 tab once daily stable overall by hx and exam, ok to continue meds/tx as is   Complete Medication List: 1)  Aspirin 81 Mg Tabs (Aspirin) .Marland Kitchen.. 1 tab once daily 2)  Lisinopril 10 Mg Tabs (Lisinopril) .Marland Kitchen.. 1 by mouth qd 3)  Alprazolam 0.25 Mg Tabs (Alprazolam) .Marland Kitchen.. 1 by mouth once daily as needed 4)  Fluticasone Propionate 50 Mcg/act Susp (Fluticasone propionate) .... 2 spray/side qd 5)  Simvastatin 40 Mg Tabs (Simvastatin) .... Take one tablet by mouth daily at bedtime 6)  Amlodipine Besylate 5 Mg Tabs (Amlodipine besylate) .... Take one by mouth once daily 7)  Fish Oil 1000 Mg Caps (Omega-3 fatty acids) .... Take one by mouth once daily 8)  Celexa 20 Mg Tabs (Citalopram hydrobromide) .Marland Kitchen.. 1 tab once daily 9)  Omeprazole 40 Mg Cpdr (Omeprazole) .... Take one by mouth once daily  Other Orders: Admin 1st Vaccine [phone]) Flu Vaccine 24yr + ([phone] Tdap => 771yrIM (9(11572Admin of Any Addtl Vaccine (9(62035 Patient Instructions: 1)  you had the flu shot today and the tetanus shot today 2)  Please go to the Lab in the basement for your blood and/or urine tests today 3)  Please call the number on the [place] results of your testing  4)  Continue all previous medications as before this visit  5)  You will be contacted about the referral(s) to: colonoscopy after jan 1 with Dr PeHenrene Pastor)  Please schedule a follow-up appointment in 1 year or sooner if needed Prescriptions: ALPRAZOLAM 0.25 MG  TABS (ALPRAZOLAM) 1 by mouth once daily as needed  #90 x 0   Entered and Authorized by:   JaBiagio BorgD   Signed by:   JaBiagio BorgD on 03/11/2010   Method used:   Print then Give to Patient   RxID:   [id number]  Orders Added: 1)  Admin 1st Vaccine [90471] 2)  Flu Vaccine 3y71yr [90[32122]  Gastroenterology Referral [GI] 4)  Tdap => 74yr69yr [907[48250] Admin of Any  Addtl Vaccine [90472] 6)  TLB-BMP (Basic Metabolic Panel-BMET) [800[[id number]] TLB-CBC Platelet - w/Differential [85025-CBCD] 8)  TLB-Hepatic/Liver Function Pnl [80076-HEPATIC] 9)  TLB-Lipid Panel [80061-LIPID] 10)  TLB-PSA (Prostate Specific Antigen) [84153-PSA] 11)  TLB-TSH (Thyroid Stimulating Hormone) [84443-TSH] 12)  TLB-Udip ONLY [81003-UDIP] 13)  Est. Patient 65& > [993[45038]mmunizations Administered:  Tetanus Vaccine:    Vaccine Type: Tdap    Site: right deltoid    Mfr: GlaxoSmithKline    Dose: 0.5 ml    Route: IM    Given by: RobiShirlean Mylarng CMA (AAMAAspinwall Exp. Date: 03/13/2012    Lot #: [id number] given: 04/11/08 version given March 11, 2010.   Immunizations Administered:  Tetanus Vaccine:    Vaccine Type: Tdap    Site: right  deltoid    Mfr: GlaxoSmithKline    Dose: 0.5 ml    Route: IM    Given by: Shirlean Mylar Ewing CMA ([place])    Exp. Date: 03/13/2012    Lot #: [id number]    VIS given: 04/11/08 version given March 11, 2010. Flu Vaccine Consent Questions     Do you have a history of severe allergic reactions to this vaccine? no    Any prior history of allergic reactions to egg and/or gelatin? no    Do you have a sensitivity to the preservative Thimersol? no    Do you have a past history of Guillan-Barre Syndrome? no    Do you currently have an acute febrile illness? no    Have you ever had a severe reaction to latex? no    Vaccine information given and explained to patient? yes    Are you currently pregnant? no    Lot Number:[id number]   Exp Date:11/22/2010   Site Given  Left Deltoid IMbflu1

## 2010-06-26 NOTE — Miscellaneous (Signed)
  Clinical Lists Changes  Medications: Changed medication from NIASPAN 1000 MG CR-TABS (NIACIN (ANTIHYPERLIPIDEMIC)) Take one half a tablet every day for 2 weeks then 1 tablet every day ( Take 1 75mAspirin 30 minutes prior to Niaspan ) to NIASPAN 1000 MG CR-TABS (NIACIN (ANTIHYPERLIPIDEMIC)) Take one and one half tabs every day ( Take 1 863mAspirin 30 minutes prior to Niaspan ) - Signed Rx of NIASPAN 1000 MG CR-TABS (NIACIN (ANTIHYPERLIPIDEMIC)) Take one and one half tabs every day ( Take 1 8132mspirin 30 minutes prior to Niaspan );  #135 x 3;  Signed;  Entered by: JacFrancetta FoundN, BSN;  Authorized by: PauAnnye RuskD, FACGood Shepherd Rehabilitation HospitalMethod used: Faxed to [facility] , [place]  , Ph: [phone]: [phone] Prescriptions: NIASPAN 1000 MG CR-TABS (NIACIN (ANTIHYPERLIPIDEMIC)) Take one and one half tabs every day ( Take 1 22m77mpirin 30 minutes prior to Niaspan )  #135 x 3   Entered by:   JacqFrancetta Found, BSN   Authorized by:   PaulAnnye Rusk, FACCDominican Hospital-Santa Cruz/Soqueligned by:   JacqFrancetta Found, BSN on 09/30/2009   Method used:   Faxed to ...       [facility] (mail-order)             , [place]  [place]     Ph: [phone]   Fax: [phone]:   [phone]

## 2010-06-26 NOTE — Progress Notes (Signed)
Summary: DME AUTHORIZATION  Phone Note From Other Clinic   Caller: [employer] referral dept - [phone] Summary of Call: Dr Jenny Reichmann completed POS-T-Vac forms for male vacuum erection device. Per DME company, pt has an HMO and it requires authorization for this DME device. I called for authorization and it went to clinical review, a nurse will call office back for further info. OR we can fax info supporting the need for DME device to [phone] REF# [phone].  Can you give info supporting use of this device so I can submit to insurance?  Initial call taken by: Charlsie Quest, [place],  March 14, 2010 2:35 PM  Follow-up for Phone Call        OK to send list of problems I guess if that is enough documentation Follow-up by: Biagio Borg MD,  March 14, 2010 3:08 PM  Additional Follow-up for Phone Call Additional follow up Details #1::        No, I have submitted the diagnoses already, we would need a statement from you as to why he needs this device. OR why the other therapies like medication are not an option.  Additional Follow-up by: Charlsie Quest, [place],  March 14, 2010 4:39 PM    Additional Follow-up for Phone Call Additional follow up Details #2::    I did a letter - ok to print and send Follow-up by: Biagio Borg MD,  March 14, 2010 5:03 PM  Additional Follow-up for Phone Call Additional follow up Details #3:: Details for Additional Follow-up Action Taken: Faxed  Additional Follow-up by: Charlsie Quest, CMA,  March 14, 2010 5:21 PM

## 2010-06-26 NOTE — Medication Information (Signed)
Summary: Pantoprazole/[facility]  P[facility]oprazole/[facility]   Imported By: Phillis Knack 06/19/2009 14:52:11  _____________________________________________________________________  External Attachment:    Type:   Image     Comment:   External Document

## 2010-06-30 ENCOUNTER — Telehealth: Payer: Self-pay | Admitting: Internal Medicine

## 2010-07-08 ENCOUNTER — Telehealth: Payer: Self-pay | Admitting: Internal Medicine

## 2010-07-09 ENCOUNTER — Telehealth: Payer: Self-pay | Admitting: Internal Medicine

## 2010-07-10 NOTE — Progress Notes (Signed)
Summary: RFs - MAIL ORDER  Phone Note Call from Patient   Summary of Call: Patient is [facility] Simvastatin, Citalopram, omeprazole, amlodipine & lisinopril.  Initial call taken by: Charlsie Quest, [place],  June 30, 2010 2:25 PM  Follow-up for Phone Call        routine refills to robin Follow-up by: Biagio Borg MD,  June 30, 2010 4:42 PM    Prescriptions: OMEPRAZOLE 40 MG CPDR (OMEPRAZOLE) take one by mouth once daily  #90 x 2   Entered by:   Shirlean Mylar Ewing CMA ([place])   Authorized by:   Biagio Borg MD   Signed by:   Sharon Seller CMA ([place]) on 07/01/2010   Method used:   Faxed to ...       [facility] (mail-order)       [place]  [address]       Ph: [phone]       Fax: [phone]   RxID:   [id number] CELEXA 20 MG TABS (CITALOPRAM HYDROBROMIDE) 1 tab once daily  #90 x 2   Entered by:   Shirlean Mylar Ewing CMA ([place])   Authorized by:   Biagio Borg MD   Signed by:   Sharon Seller CMA ([place]) on 07/01/2010   Method used:   Faxed to ...       [facility] (mail-order)       [place]  [address]       Ph: [phone]       Fax: [phone]   RxID:   [id number] AMLODIPINE BESYLATE 5 MG TABS (AMLODIPINE BESYLATE) take one by mouth once daily  #90 x 2   Entered by:   Sharon Seller CMA ([place])   Authorized by:   Biagio Borg MD   Signed by:   Sharon Seller CMA ([place]) on 07/01/2010   Method used:   Faxed to ...       [facility] (mail-[place]  [address]       Ph: [phone]       Fax: [phone]   RxID:   [id number] SIMVASTATIN 40 MG TABS (SIMVASTATIN) Take one tablet by mouth daily at bedtime  #90 x 2   Entered by:   Sharon Seller CMA ([place])   Authorized by:   Biagio Borg MD   Signed by:   Sharon Seller CMA ([place]) on 07/01/2010   Method used:   Faxed to ...       [facility] (mail-order)       [place]  [address]       Ph: [phone]       Fax: [phone]   RxID:   [id number] LISINOPRIL 10 MG  TABS (LISINOPRIL) 1 by mouth qd  #90 x 2   Entered by:   Sharon Seller CMA ([place])   Authorized by:   Biagio Borg MD   Signed by:   Sharon Seller CMA ([place]) on 07/01/2010   Method used:   Faxed to ...       [facility] (mail-order)       [place]  [address]       Ph: [phone]       Fax: [phone]   RxID:   [id number]

## 2010-07-16 NOTE — Progress Notes (Signed)
Summary: RFS  Phone Note Call from Patient Call back at Home Phone [phone]   Summary of Call: Patient is requesting a call regarding prescription refills.  Initial call taken by: Charlsie Quest, [place],  July 09, 2010 12:32 PM  Follow-up for Phone Call        called pt left msg. to call back Follow-up by: Robin Ewing CMA Deborra Medina),  July 09, 2010 12:56 PM  Additional Follow-up for Phone Call Additional follow up Details #1::        pt left a message on triage to return his call please Additional Follow-up by: Denice [place],  July 09, 2010 1:19 PM    Additional Follow-up for Phone Call Additional follow up Details #2::    called pt back he states call couple days ago for refills on meds. Does not use [facility]. Need [facility]s to go to [facility]. Let pt know will resend meds to [facility] Follow-up by: Tomma Lightning RMA,  July 09, 2010 2:22 PM  Prescriptions: SIMVASTATIN 20 MG TABS (SIMVASTATIN) 1 by mouth once daily  #90 x 2   Entered by:   Tomma Lightning RMA   Authorized by:   Biagio Borg MD   Signed by:   Tomma Lightning RMA on 07/09/2010   Method used:   Faxed to ...       [facility] (mail-order)             , [place]         Ph: [phone]       Fax: [phone]   RxID:   [id number] OMEPRAZOLE 40 MG CPDR (OMEPRAZOLE) take one by mouth once daily  #90 x 2   Entered by:   Tomma Lightning RMA   Authorized by:   Biagio Borg MD   Signed by:   Tomma Lightning RMA on 07/09/2010   Method used:   Faxed to ...       [facility] (mail-order)             , [place]         Ph: [phone]       Fax: [phone]   RxID:   [id number] CELEXA 20 MG TABS (CITALOPRAM HYDROBROMIDE) 1 tab once daily  #90 x 2   Entered by:   Tomma Lightning RMA   Authorized by:   Biagio Borg MD   Signed by:   Tomma Lightning RMA on 07/09/2010   Method used:   Faxed to ...       [facility] (mail-order)             , [place]         Ph: [phone]       Fax: [phone]   RxID:   [id number] AMLODIPINE BESYLATE 5 MG TABS  (AMLODIPINE BESYLATE) take one by mouth once daily  #90 x 2   Entered by:   Tomma Lightning RMA   Authorized by:   Biagio Borg MD   Signed by:   Tomma Lightning RMA on 07/09/2010   Method used:   Faxed to ...       [facility] (mail-order)             , [place]         Ph: [phone]       Fax: [phone]   RxID:   [id number] SIMVASTATIN 40 MG TABS (SIMVASTATIN) Take one tablet by mouth daily at bedtime  #90 x 2   Entered by:  Tomma Lightning RMA   Authorized by:   Biagio Borg MD   Signed by:   Tomma Lightning RMA on 07/09/2010   Method used:   Faxed to ...       [facility] (mail-order)             , [place]         Ph: [phone]       Fax: [phone]   RxID:   [id number] LISINOPRIL 10 MG  TABS (LISINOPRIL) 1 by mouth qd  #90 x 2   Entered by:   Tomma Lightning RMA   Authorized by:   Biagio Borg MD   Signed by:   Tomma Lightning RMA on 07/09/2010   Method used:   Faxed to ...       [facility] (mail-order)             , [place]         Ph: [phone]       Fax: [phone]   RxID:   [id number]

## 2010-07-16 NOTE — Progress Notes (Signed)
Summary: Medication clarification  Phone Note From Pharmacy   Caller: [facility] Summary of Call: Pharmacy requesting clarification. Patient is on Celexa and Omeprazole, which interact, do you want pt. to continue on both medications?Also pt. is on simvastatin 40 and Amlodipine. Due to FDA warnings for dosage not to exceed simvastatin 41m when taking amlodipiine, advise please. Initial call taken by: Robin Ewing CMA (Deborra Medina,  July 08, 2010 3:52 PM  Follow-up for Phone Call        ok for all except decrease the simvastatin to 20 mg - to robin to handle Follow-up by: JBiagio BorgMD,  July 08, 2010 3:54 PM  Additional Follow-up for Phone Call Additional follow up Details #1::        [facility] in simvastatin [facility] informed of above information Additional Follow-up by: Robin Ewing CMA (AAMA),  July 08, 2010 4:19 PM    New/Updated Medications: SIMVASTATIN 20 MG TABS (SIMVASTATIN) 1 by mouth once daily Prescriptions: SIMVASTATIN 20 MG TABS (SIMVASTATIN) 1 by mouth once daily  #90 x 2   Entered by:   RoSharon SellerMA (AAMiddle [place]  Authorized by:   JaBiagio BorgD   Signed by:   RoSharon SellerMA (AAVersailleson 07/08/2010   Method used:   Faxed to ...       [facility] ([facility])       [address] [place]  [address]     Ph: [phone]     Fax: [phone] RxID:   [id number]

## 2010-08-01 ENCOUNTER — Encounter: Payer: Self-pay | Admitting: Internal Medicine

## 2010-08-21 NOTE — Letter (Signed)
Summary: [facility]   Imported By: Rise Patience 08/11/2010 15:32:05  _____________________________________________________________________  External Attachment:    Type:   Image     Comment:   External Document

## 2010-08-24 HISTORY — PX: NASAL SEPTUM SURGERY: SHX37

## 2010-08-29 LAB — PROTIME-INR: Prothrombin Time: 13.5 seconds (ref 11.6–15.2)

## 2010-08-29 LAB — LIPID PANEL
HDL: 27 mg/dL — ABNORMAL LOW (ref >39)
LDL Cholesterol: 95 mg/dL (ref 0–99)
Triglycerides: 134 mg/dL (ref <150)

## 2010-08-29 LAB — BASIC METABOLIC PANEL
BUN: 13 mg/dL (ref 6–23)
Calcium: 9.2 mg/dL (ref 8.4–10.5)
Chloride: 103 mEq/L (ref 96–112)
GFR calc Af Amer: >60 mL/min (ref >60)
GFR calc non Af Amer: >60 mL/min (ref >60)
GFR calc non Af Amer: >60 mL/min (ref >60)
Glucose, Bld: 143 mg/dL — ABNORMAL HIGH (ref 70–99)
Potassium: 3.5 mEq/L (ref 3.5–5.1)
Potassium: 3.9 mEq/L (ref 3.5–5.1)
Sodium: 135 mEq/L (ref 135–145)
Sodium: 138 mEq/L (ref 135–145)

## 2010-08-29 LAB — DIFFERENTIAL
Basophils Absolute: 0.1 10*3/uL (ref 0.0–0.1)
Basophils Relative: 1 % (ref 0–1)
Lymphocytes Relative: 25 % (ref 12–46)
Neutro Abs: 4 10*3/uL (ref 1.7–7.7)
Neutrophils Relative %: 61 % (ref 43–77)

## 2010-08-29 LAB — TSH: TSH: 1.34 u[IU]/mL (ref 0.350–4.500)

## 2010-08-29 LAB — CBC
HCT: 44.4 % (ref 39.0–52.0)
HCT: 45.4 % (ref 39.0–52.0)
HCT: 46.8 % (ref 39.0–52.0)
Hemoglobin: 15.3 g/dL (ref 13.0–17.0)
Hemoglobin: 15.6 g/dL (ref 13.0–17.0)
Platelets: 207 10*3/uL (ref 150–400)
RBC: 4.6 MIL/uL (ref 4.22–5.81)
RBC: 4.66 MIL/uL (ref 4.22–5.81)
RDW: 14.5 % (ref 11.5–15.5)
RDW: 14.5 % (ref 11.5–15.5)
WBC: 10.6 10*3/uL — ABNORMAL HIGH (ref 4.0–10.5)
WBC: 11.7 10*3/uL — ABNORMAL HIGH (ref 4.0–10.5)
WBC: 6.6 10*3/uL (ref 4.0–10.5)

## 2010-08-29 LAB — COMPREHENSIVE METABOLIC PANEL
ALT: 14 U/L (ref 0–53)
Alkaline Phosphatase: 65 U/L (ref 39–117)
BUN: 14 mg/dL (ref 6–23)
Chloride: 105 mEq/L (ref 96–112)
Glucose, Bld: 103 mg/dL — ABNORMAL HIGH (ref 70–99)
Potassium: 5.1 mEq/L (ref 3.5–5.1)
Total Bilirubin: 1.5 mg/dL — ABNORMAL HIGH (ref 0.3–1.2)

## 2010-08-29 LAB — GASTRIC OCCULT BLOOD (1-CARD TO LAB): Occult Blood, Gastric: POSITIVE — AB

## 2010-08-29 LAB — APTT: aPTT: 27 seconds (ref 24–37)

## 2010-08-29 LAB — CK TOTAL AND CKMB (NOT AT ARMC): Relative Index: 1.5 (ref 0.0–2.5)

## 2010-08-29 LAB — CARDIAC PANEL(CRET KIN+CKTOT+MB+TROPI)
CK, MB: 2.9 ng/mL (ref 0.3–4.0)
CK, MB: 3.6 ng/mL (ref 0.3–4.0)
Total CK: 70 U/L (ref 7–232)
Troponin I: 0.03 ng/mL (ref 0.00–0.06)

## 2010-09-04 ENCOUNTER — Telehealth: Payer: Self-pay | Admitting: Internal Medicine

## 2010-09-04 NOTE — Telephone Encounter (Signed)
12 lead faxed to Fran/Dr.Britt's Office @ [phone] 09/04/10/KM

## 2010-09-24 ENCOUNTER — Encounter: Payer: Self-pay | Admitting: Internal Medicine

## 2010-09-26 ENCOUNTER — Encounter: Payer: Self-pay | Admitting: Internal Medicine

## 2010-09-26 ENCOUNTER — Ambulatory Visit (INDEPENDENT_AMBULATORY_CARE_PROVIDER_SITE_OTHER): Payer: Medicare Other | Admitting: Internal Medicine

## 2010-09-26 VITALS — BP 127/81 | HR 61 | Ht 72.0 in | Wt 234.0 lb

## 2010-09-26 DIAGNOSIS — E785 Hyperlipidemia, unspecified: Secondary | ICD-10-CM

## 2010-09-26 DIAGNOSIS — I2510 Atherosclerotic heart disease of native coronary artery without angina pectoris: Secondary | ICD-10-CM

## 2010-09-26 DIAGNOSIS — I10 Essential (primary) hypertension: Secondary | ICD-10-CM

## 2010-09-26 NOTE — Progress Notes (Signed)
[name] is a 70 year old gentleman. He has a history of coronary artery disease.  I last saw the patient back in September of last year.  Last cardiac catheterization (11/2008) showed 100% left main RCA was diffusely diseased 50-60% proximal 50% mid. Vessel is occluded after the PDA, SVG RCA was occluded ostially, SVG to ramus was ectatic with 30% blockage in the midsection. SVG to diagonal was ectatic 40% lesion. Diagonal was patent. Upper branch had a 99% stenosis, but was a small vessel. There were left to right collaterals. Left subclavian tortuous. No flow-limiting plaque, LIMA to LAD was patent. LAD was small. Abdominal aortogram showed patent renal arteries. 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 I saw him last in clinic in Aprill 2011 Since seen he has done well.  No chest pain.  Had nose surgery.  Breathing better. 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 LAst lipids LDL was 49, HDL was 26. (8/11)  Allergies no known allergies  Current Outpatient Prescriptions  Medication Sig Dispense Refill  . ALPRAZolam (XANAX) 0.25 MG tablet Take 0.25 mg by mouth daily as needed.        Marland Kitchen amLODipine (NORVASC) 5 MG tablet Take 5 mg by mouth daily.        Marland Kitchen aspirin 81 MG tablet Take 81 mg by mouth daily.        . citalopram (CELEXA) 20 MG tablet Take 20 mg by mouth daily.        . fish oil-omega-3 fatty acids 1000 MG capsule Take 1 capsule by mouth daily.        Marland Kitchen lisinopril (PRINIVIL,ZESTRIL) 10 MG tablet Take 10 mg by mouth daily.        Marland Kitchen omeprazole (PRILOSEC) 40 MG capsule Take 40 mg by mouth daily.        . simvastatin (ZOCOR) 40 MG tablet Take 40 mg by mouth at bedtime.        Marland Kitchen DISCONTD: fluticasone (FLONASE) 50 MCG/ACT nasal spray 2 sprays by Nasal route daily.          Past Medical History  Diagnosis Date  . GI bleed   . CAD (coronary artery disease)   . Nausea and vomiting   .  Hematemesis   . Abrasion of knee, left   . Shoulder joint pain   . Chest pain     left  . Back pain   . Hip pain, right   . PSA (psoriatic arthritis)     increased  . Lumbar disc disease   . Malignant neoplasm of prostate   . Fatigue   . COPD (chronic obstructive pulmonary disease)   . Renal calculus   . Osteoarthritis   . HTN (hypertension)   . Depression   . Anxiety   . ED (erectile dysfunction)   . Hyperlipidemia   . GERD (gastroesophageal reflux disease)   . Allergic rhinitis   . Peptic stricture of esophagus     Past Surgical History  Procedure Date  . Coronary angioplasty 06/03/2007, 01/25/2009  . Coronary artery bypass graft     Family History  Problem Relation Age of Onset  . Heart attack Father   . Colon cancer Neg Hx     History   Social History  . Marital Status: Married    Spouse Name: N/A    Number of Children: N/A  . Years of Education: N/A  Occupational History  . Retired    Social History Main Topics  . Smoking status: Former Research scientist (life sciences)  . Smokeless tobacco: Not on file  . Alcohol Use: No  . Drug Use: No  . Sexually Active: Not on file   Other Topics Concern  . Not on file   Social History Narrative  . No narrative on file    Review of Systems:  All systems reviewed.  They are negative to the above problem except as previously stated.  Vital Signs: BP 127/81  Pulse 61  Ht 6' (1.829 m)  Wt 234 lb (106.142 kg)  BMI 31.74 kg/m2  Physical Exam  HEENT:  Normocephalic, atraumatic. EOMI, PERRLA.  Neck: JVP is normal. No thyromegaly. No bruits.  Lungs: clear to auscultation. No rales no wheezes.  Heart: Regular rate and rhythm. Normal S1, S2. No S3.   No significant murmurs. PMI not displaced.  Abdomen:  Supple, nontender. Normal bowel sounds. No masses. No hepatomegaly.  Extremities:   Good distal pulses throughout. No lower extremity edema.  Musculoskeletal :moving all extremities.  Neuro:   alert and oriented x3.  CN II-XII grossly  intact.  Sinus bradycardia.  60 bpm.  First degree AV block PR interval 326 msec.  T wave inversion V1-V6.  Assessment and Plan:

## 2010-09-26 NOTE — Patient Instructions (Signed)
Your physician wants you to follow-up in: January 2013 with Dr.Ross You will receive a reminder letter in the mail two months in advance. If you don't receive a letter, please call our office to schedule the follow-up appointment.

## 2010-09-27 DIAGNOSIS — E785 Hyperlipidemia, unspecified: Secondary | ICD-10-CM | POA: Insufficient documentation

## 2010-09-27 NOTE — Assessment & Plan Note (Signed)
Continue meds.

## 2010-09-27 NOTE — Assessment & Plan Note (Signed)
No symptoms of angina.  Follow.

## 2010-10-07 NOTE — Assessment & Plan Note (Signed)
[place]                            CARDIOLOGY OFFICE NOTE   Wayne Torres, Wayne Torres                       MRN:          [id number]  DATE:06/03/2007                            DOB:          11-06-1940    IDENTIFICATION:  Wayne Torres is a 70 year old gentleman with a history of  coronary artery disease.  He is followed by Cathlean Cower in [facility]. He is re-referred for continued cardiac care. Note, I actually  saw the patient in the past it looks like in 2001.  He is status post  bypass grafting in 1995.  Myoview scan in 2001 showed some basal  inferior ischemia EF of 54%.   Since being seen, he says his breathing is okay.  He denies chest pain.  He takes things slow because of right hip problems.  This is his  limiting factor.   CURRENT MEDICATIONS:  1. Include aspirin 325 daily.  2. Lisinopril 10.  3. Lexapro 20.  4. Simvastatin 40.  5. Fish oil.   ALLERGIES:  None.   PAST MEDICAL HISTORY:  1. Coronary artery disease, status post coronary artery bypass graft      in 1995. Last  Myoview scan as stated above in 2001.  2. Dyslipidemia.  3. Degenerative joint disease.   SOCIAL HISTORY:  The patient does not smoke.  He is married.   FAMILY HISTORY:  Father had MI at age 71, died. Positive for CVA in  grandfather.   REVIEW OF SYSTEMS:  All other systems reviewed; negative to the above  problem, except as noted.   PHYSICAL EXAM:  On exam the patient is in no acute distress.  Blood  pressure is 110/80, pulse is 52 and regular, weight 219.  HEENT:  Normocephalic, atraumatic.  EOMI.  PERRL.  Mucous membranes are moist.  NECK:  JVP is normal.  No thyromegaly.  No bruits.  No masses.  LUNGS:  Clear. Without rales or wheezes.  CARDIAC: Regular rate and rhythm, S1-S2.  No S3-S4 or significant  murmurs.  ABDOMEN:  Supple.  No masses.  Normal bowel sounds.  No hepatomegaly.  EXTREMITIES:  2+ distal pulses.  No lower extremity edema.  NEUROMUSCULAR EXAM:  moving all extremities.  Strength was not tested.   A 12-lead EKG:  Sinus bradycardia, 52 beats per minute, first-degree AV  block with a PR interval of 240 milliseconds.  T-wave inversion noted in  V2-V6, 2, 3 and F.  Left anterior fascicular block.  Note, he had the T-  wave changes in previous EKGs.   IMPRESSION:  1. Coronary artery disease. He is now 13 years post bypass. He should      have a Myoview scan given that he is fairly inactive with his      degenerative joint disease.  I will follow up on this and schedule      at his convenience.  2. Dyslipidemia.  Will need to follow up with lipid panel from Dr.      Jenny Reichmann and be back in touch with the patient.  Continue on current  regimen for right now. Note, he had been on Niaspan in the past and      said that did not help much.   I will set to see the patient back in 9 months. Be in touch with him  regarding his Myoview scan with his disease process.  Will also set up  for carotid ultrasound as well.  There were no audible bruits, but does  have significant sure significant disease elsewhere.     [name], MD, [facility]  Electronically Signed    PVR/MedQ  DD: 06/04/2007  DT: 06/04/2007  Job #: [id number]

## 2010-10-07 NOTE — Cardiovascular Report (Signed)
NAME:  Wayne Torres, Wayne Torres NO.:  [id number]   MEDICAL RECORD NO.:  [id number]          PATIENT TYPE:  OIB   LOCATION:  1962                         FACILITY:  [facility]   PHYSICIAN:  Loretha Brasil. Lia Foyer, MD, FACCDATE OF BIRTH:  09-Nov-1940   DATE OF PROCEDURE:  DATE OF DISCHARGE:                            CARDIAC CATHETERIZATION   INDICATIONS:  Wayne Torres is a 70 year old gentleman who previously  underwent revascularization surgery by Dr. Gwendolyn Lima, approximately  13 years ago.  The patient has had a recent Myoview which demonstrated  some inferobasal ischemia.  Dr. Harrington Challenger recommended medical therapy, but  the patient really wanted to proceed with a coronary arteriography.  He  was set up for such.  Risks, benefits, alternatives were discussed with  the patient in detail, and he consented to proceed.   PROCEDURE:  1. Left heart catheterization.  2. Selective coronary arteriography.  3. Selective left ventriculography.  4. Saphenous vein graft angiography.  5. Selective coronary arteriography.  6. Selective left internal mammary angiography.   DESCRIPTION OF PROCEDURE:  The patient was brought to the  catheterization laboratory, prepped and draped in the usual fashion.  Through an anterior puncture, the right femoral artery was entered, and  a 4-French sheath was placed.  Following this, views of the left and  right coronaries were obtained.  Minimal views were required.  Following  this, we used a left bypass catheter to engage the intermediate graft.  The  no-tow was used for the diagonal graft.  The right bypass was used  for the right bypass, and an internal mammary for the internal mammary  vessel.  Central aortic and left ventricular pressures were measured  with a pigtail.  Ventriculography was then performed in the RAO  projection.  There were no complications, and he was taken to the  holding area in satisfactory clinical condition.  I reviewed the  pictures with the patient on the table, and then subsequently with his  wife in the viewing room.   HEMODYNAMIC DATA:  1. Central aortic pressure 132/78, mean 100.  2. Left atrial pressure 148/4.  3. There was no gradient or pullback across aortic valve.   ANGIOGRAPHIC DATA:  1. Ventriculography was done in the RAO projection.  Overall systolic      function was preserved with an ejection fraction, in excess of      about 55%.  There was some mild inferobasal hypokinesis.  2. The left main has 99% narrowing, and then the LAD is totally      occluded.  Proximal circumflex is also occluded.  3. The right coronary artery demonstrates 70% proximal narrowing.  The      vessel was ectatic, and there was diffuse 70% narrowing leading      into the bend.  It provides a first posterior descending branch.  I      suspect there are two co-D PDAs, but the old films are unavailable      for review.  The posterolateral branch is totally occluded, after      the takeoff of the first PDA.  4. The saphenous vein graft to the PDA and PLA is a large patulous      graft, with an extensive ectasia.  It is diffusely irregular.  The      graft itself is greater than about 6 to 8 mm in size.  There is not      a specific area of focal narrowing.  Runoff as might be expected is      relatively slow.  5. The saphenous vein graft to the intermediate vessel has about 50%      to 60% area of proximal stenosis.  It does not appear to be      critical.  The vessel past this has a fair amount of ectasia.  It      leads into an intermediate vessel that is without critical      narrowing.  6. The saphenous vein graft to the diagonal is intact, also.  There is      probably 30% segmental plaquing proximally, then a very ectatic      graft.  7. The internal mammary to the LAD is widely patent.  The distal LAD      itself is somewhat small in caliber and appears to be probably      diffusely diseased.  There is about  40% narrowing, just after the      insertion.   CONCLUSION:  1. Preserved overall LV function with a small inferior basal wall      motion abnormality.  2. Continued patency of the left internal mammary, to a smaller      caliber LAD.  3. Continued patency of the saphenous vein graft to the diagonal.  4. Continued patency of saphenous vein graft to the intermediate, with      about 50% to 60% area of proximal narrowing.  5. Continued patency of the native right coronary artery to a first      PDA with diffuse disease is noted, and continued patency of a large      ectatic saphenous vein graft to the distal PDA, PLA system as noted      above.   RECOMMENDATIONS:  The patient remains asymptomatic, and therefore  continued medical therapy would be recommended.  All efforts and  prevention would be appropriate.  I have reviewed these films with the  patient and his family.      Loretha Brasil. Lia Foyer, MD, [facility]  Electronically Signed     TDS/MEDQ  D:  06/22/2007  T:  06/22/2007  Job:  [id number]   cc:   Loretha Brasil. Lia Foyer, MD, [facility]  [name], MD, [facility]  CV Laboratory  Patient Medical Record

## 2010-10-07 NOTE — Assessment & Plan Note (Signed)
[place]                            CARDIOLOGY OFFICE NOTE   Wayne, Torres                       MRN:          [id number]  DATE:06/27/2007                            DOB:          Jul 05, 1940    IDENTIFICATION:  [name] is a 70 year old gentleman with coronary  artery disease.  I just saw him in January, went ahead and scheduled a  Myoview scan, and this was done on January 14.  It showed very mild  distal inferior ischemia.  My recommendation was for medical therapy.  The patient had reflected and decided he wanted to have a heart  catheterization.  This was done then on January 28 by Dr. Addison Lank.   Overall LV function was preserved with small inferior basilar motion  abnormality.  LIMA was patent.  SVG was patent to diagonal.  SVG to  intermedius had about a 50-60% narrowing in its proximal portion.  SVG  to RCA to PDA was large with extensive ectasia.  In the distal vessel,  the PDA had diffuse disease.  Plan was for medical therapy.   Since discharge the patient has done okay from a cardiac standpoint.  Again he still has no chest pain.   CURRENT MEDICATIONS:  1. Aspirin 325 mg.  2. Lisinopril 10 mg.  3. Fish oil.  4. Simcor 20/1000 mg.  5. Lexapro 10 mg.   PHYSICAL EXAM:  The patient is in no distress.  Blood pressure is  124/65, pulse is 65 and regular, weight 219.  LUNGS:  Clear.  CARDIAC:  Regular rate and rhythm, S1 and S2, no S3, no murmurs.  ABDOMEN:  Benign.  EXTREMITIES:  No edema.  The right groin without bruit.   IMPRESSION:  1. Coronary artery disease appears to be stable.  Would continue on      current medications.  He can back down to 81 mg of aspirin.  2. Dyslipidemia.  Will need to have follow-up lipids in a couple of      months on Simcor.   Otherwise, I will set follow-up for the fall.  From a cardiac  standpoint, I think she would be okay to proceed with surgery if he  decides to have his hip worked  on, low-risk.   ADDENDUM:  Twelve-lead EKG:  Sinus rhythm, first-degree AV block, with  PR interval 258 msec.  T-wave inversion in the anterior lateral leads  unchanged.     [name], MD, [facility]  Electronically Signed    PVR/MedQ  DD: 06/27/2007  DT: 06/28/2007  Job #: [id number]

## 2010-10-10 NOTE — Discharge Summary (Signed)
[place]  Patient:    Wayne Torres, Wayne Torres                       MRN: [id number] Adm. Date:  [id number] Disc. Date: [id number] Attending:  Biagio Borg Dictator:   Helayne Seminole, P.A. CC:         Biagio Borg, M.D. [facility]                           Discharge Summary  DISCHARGE DIAGNOSES: 1. Hypertension. 2. Anxiety.  BRIEF HISTORY OF PRESENT ILLNESS:  Mr. Stalvey is a 70 year old white male who awoke around 2 a.m. complaining "hot and nervous". He stated his head was profusely sweating. He had no dyspnea. No chest pain. No heart palpitations. This was relieved with pacing and "calming himself down". Duration was approximately two hours. He has similar episodes that occur every two to three months. These occur at night. Duration ranges from 15 minutes to several hours. No precipitating factors. Typically relieved with pacing. He denies any external stressors.  ADMISSION LABORATORY DATA:  CK was 86, MB was 0.6. Troponin I was less than 0.03.  Twelve-lead EKG revealed a more prominent T wave inversion. Otherwise normal sinus rhythm.  HOSPITAL COURSE: #1 - HYPERTENSION:  The patient had significantly elevated blood pressures with systolic over 435. The patient has no previous history of hypertension. He is not currently on any antihypertensives. The patients blood pressure did correct with Xanax. Antihypertensives at discharge will be determined by Rejeana Brock. Vanessa Collingsworth, M.D.  #2 - HISTORY OF CORONARY ARTERY DISEASE, STATUS POST CABG IN 1995:  We did have cardiology see the patient. As noted, the patient ruled out for MI. Cardiology recommended a Cardiolite as an outpatient. This has been scheduled.  #3 - ANXIETY:  The patient describes an episode of anxiety, possibly panic disorder. The patient has had some response with Xanax. We are also checking a 24-urine for metanephrines and catecholamines and a thyroid profile is also pending. At this time,  no further workup is indicated. The patient will be discharged home to follow up with Biagio Borg, M.D. in the next week.  DISCHARGE MEDICATIONS: 1. Antihypertensive to be determined at discharge. 2. Xanax 0.25 mg every eight hours. We will give him 20 until he can see Biagio Borg, M.D. DD:  09/10/99 TD:  09/10/99 Job: [id number]

## 2010-11-18 ENCOUNTER — Encounter: Payer: Self-pay | Admitting: Internal Medicine

## 2010-11-18 ENCOUNTER — Ambulatory Visit (INDEPENDENT_AMBULATORY_CARE_PROVIDER_SITE_OTHER)
Admission: RE | Admit: 2010-11-18 | Discharge: 2010-11-18 | Disposition: A | Discharge status: Home / Self Care | Payer: Medicare Other | Source: Ambulatory Visit | Attending: Internal Medicine | Admitting: Internal Medicine

## 2010-11-18 ENCOUNTER — Other Ambulatory Visit: Payer: Self-pay

## 2010-11-18 ENCOUNTER — Ambulatory Visit (INDEPENDENT_AMBULATORY_CARE_PROVIDER_SITE_OTHER): Payer: Medicare Other | Admitting: Internal Medicine

## 2010-11-18 VITALS — BP 100/68 | HR 63 | Temp 97.5°F | Ht 72.0 in | Wt 234.5 lb

## 2010-11-18 DIAGNOSIS — M67439 Ganglion, unspecified wrist: Secondary | ICD-10-CM

## 2010-11-18 DIAGNOSIS — M25532 Pain in left wrist: Secondary | ICD-10-CM | POA: Insufficient documentation

## 2010-11-18 DIAGNOSIS — J329 Chronic sinusitis, unspecified: Secondary | ICD-10-CM | POA: Insufficient documentation

## 2010-11-18 DIAGNOSIS — M6740 Ganglion, unspecified site: Secondary | ICD-10-CM

## 2010-11-18 DIAGNOSIS — I10 Essential (primary) hypertension: Secondary | ICD-10-CM

## 2010-11-18 DIAGNOSIS — Z0000 Encounter for general adult medical examination without abnormal findings: Secondary | ICD-10-CM | POA: Insufficient documentation

## 2010-11-18 DIAGNOSIS — M25539 Pain in unspecified wrist: Secondary | ICD-10-CM

## 2010-11-18 DIAGNOSIS — J309 Allergic rhinitis, unspecified: Secondary | ICD-10-CM

## 2010-11-18 DIAGNOSIS — J019 Acute sinusitis, unspecified: Secondary | ICD-10-CM

## 2010-11-18 MED ORDER — AZITHROMYCIN 250 MG PO TABS: ORAL_TABLET | ORAL | Status: AC

## 2010-11-18 MED ORDER — ALPRAZOLAM 0.25 MG PO TABS: 0.2500 mg | ORAL_TABLET | Freq: Every day | ORAL | Status: DC | PRN

## 2010-11-18 MED ORDER — FEXOFENADINE HCL 180 MG PO TABS: 180.0000 mg | ORAL_TABLET | Freq: Every day | ORAL | Status: DC

## 2010-11-18 NOTE — Patient Instructions (Signed)
Take all new medications as prescribed Continue all other medications as before Please return in 4 mo with Lab testing done 3-5 days before

## 2010-11-19 NOTE — Telephone Encounter (Signed)
Rx faxed to pharmacy

## 2010-11-26 ENCOUNTER — Encounter: Payer: Self-pay | Admitting: Internal Medicine

## 2010-11-26 NOTE — Assessment & Plan Note (Signed)
Mild to mod, for antibx course,  to f/u any worsening symptoms or concerns 

## 2010-11-26 NOTE — Assessment & Plan Note (Signed)
Mild to mod, for allegra trial prn,  to f/u any worsening symptoms or concerns

## 2010-11-26 NOTE — Assessment & Plan Note (Signed)
?  DJD - for left wrist film,  to f/u any worsening symptoms or concerns

## 2010-11-26 NOTE — Assessment & Plan Note (Signed)
stable overall by hx and exam, most recent data reviewed with pt, and pt to continue medical treatment as before  BP Readings from Last 3 Encounters:  11/18/10 100/68  09/26/10 127/81  03/11/10 112/68

## 2010-11-26 NOTE — Progress Notes (Signed)
Subjective:    Patient ID: Wayne Torres, male    DOB: September 05, 1940, 70 y.o.   MRN: [id number]  HPI  Here with 3 days acute onset fever, facial pain, pressure, general weakness and malaise, and greenish d/c, with slight ST, but little to no cough and Pt denies chest pain, increased sob or doe, wheezing, orthopnea, PND, increased LE swelling, palpitations, dizziness or syncope.  Does have several wks ongoing nasal allergy symptoms with clear congestion, itch and sneeze, without fever, pain, ST, cough or wheezing.  Pt denies new neurological symptoms such as new headache, or facial or extremity weakness or numbness   Pt denies polydipsia, polyuria, Does also have left wrist with intermittent mild diffuse swelling and pain for several wks, but more recently with 2 wks onset local swelling to the wrist as well, tends to use the hands quite a bit for work. Past Medical History  Diagnosis Date  . GI bleed   . CAD (coronary artery disease)   . Nausea and vomiting   . Hematemesis   . Abrasion of knee, left   . Shoulder joint pain   . Chest pain     left  . Back pain   . Hip pain, right   . PSA (psoriatic arthritis)     increased  . Lumbar disc disease   . Malignant neoplasm of prostate   . Fatigue   . COPD (chronic obstructive pulmonary disease)   . Renal calculus   . Osteoarthritis   . HTN (hypertension)   . Depression   . Anxiety   . ED (erectile dysfunction)   . Hyperlipidemia   . GERD (gastroesophageal reflux disease)   . Allergic rhinitis   . Peptic stricture of esophagus    Past Surgical History  Procedure Date  . Coronary angioplasty 06/03/2007, 01/25/2009  . Coronary artery bypass graft     reports that he has quit smoking. He does not have any smokeless tobacco history on file. He reports that he does not drink alcohol or use illicit drugs. family history includes Heart attack in his father and Heart disease in his father.  There is no history of Colon cancer and Cancer. No  Known Allergies Current Outpatient Prescriptions on File Prior to Visit  Medication Sig Dispense Refill  . amLODipine (NORVASC) 5 MG tablet Take 5 mg by mouth daily.        Marland Kitchen aspirin 81 MG tablet Take 81 mg by mouth daily.        . fish oil-omega-3 fatty acids 1000 MG capsule Take 1 capsule by mouth daily.        Marland Kitchen lisinopril (PRINIVIL,ZESTRIL) 10 MG tablet Take 10 mg by mouth daily.        Marland Kitchen omeprazole (PRILOSEC) 40 MG capsule Take 40 mg by mouth daily.        . simvastatin (ZOCOR) 40 MG tablet Take 40 mg by mouth at bedtime.        . citalopram (CELEXA) 20 MG tablet Take 20 mg by mouth daily.         Review of Systems Review of Systems  Constitutional: Negative for diaphoresis and unexpected weight change.  HENT: Negative for drooling and tinnitus.   Eyes: Negative for photophobia and visual disturbance.  Respiratory: Negative for choking and stridor.   Gastrointestinal: Negative for vomiting and blood in stool.  Genitourinary: Negative for hematuria and decreased urine volume.  Musculoskeletal: Negative for gait problem.     Objective:   Physical  Exam BP 100/68  Pulse 63  Temp(Src) 97.5 F (36.4 C) (Oral)  Ht 6' (1.829 m)  Wt 234 lb 8 oz (106.369 kg)  BMI 31.80 kg/m2  SpO2 95% Physical Exam  VS noted Constitutional: Pt appears well-developed and well-nourished.  HENT: Head: Normocephalic.  Right Ear: External ear normal.  Left Ear: External ear normal.  Bilat tm's mild erythema.  Sinus tender bilat.  Pharynx mild erythema Eyes: Conjunctivae and EOM are normal. Pupils are equal, round, and reactive to light.  Neck: Normal range of motion. Neck supple.  Cardiovascular: Normal rate and regular rhythm.   Pulmonary/Chest: Effort normal and breath sounds normal.  Abd:  Soft, NT, non-distended, + BS Neurological: Pt is alert. No cranial nerve deficit.  Skin: Skin is warm. No erythema.  Psychiatric: Pt behavior is normal. Thought content normal.  Left wrist with mild decreased  ROM, NT, no effusion, but does have approx 1 cm ant ganglion cyst, tender, mobile       Assessment & Plan:

## 2010-11-26 NOTE — Assessment & Plan Note (Addendum)
Small with pain - declines hand surgury referral, to f/u any worsening symptoms or concerns

## 2011-01-21 ENCOUNTER — Other Ambulatory Visit: Payer: Self-pay

## 2011-01-21 MED ORDER — AMLODIPINE BESYLATE 5 MG PO TABS: 5.0000 mg | ORAL_TABLET | Freq: Every day | ORAL | Status: DC

## 2011-01-21 MED ORDER — OMEPRAZOLE 40 MG PO CPDR: 40.0000 mg | DELAYED_RELEASE_CAPSULE | Freq: Every day | ORAL | Status: DC

## 2011-01-21 MED ORDER — CITALOPRAM HYDROBROMIDE 20 MG PO TABS: 20.0000 mg | ORAL_TABLET | Freq: Every day | ORAL | Status: DC

## 2011-02-24 ENCOUNTER — Telehealth: Payer: Self-pay | Admitting: Internal Medicine

## 2011-02-24 NOTE — Telephone Encounter (Signed)
Pt calling wanting to schedule stress test. Please return pt call to advise/discuss.

## 2011-02-24 NOTE — Telephone Encounter (Signed)
i called pt to discuss stress test--he states dr Harrington Challenger wanted him to have one but, i do not see anything mentioned in last o.v. Except to f/u in one year--please advise--thanks nt

## 2011-03-03 NOTE — Telephone Encounter (Signed)
Make sure patient has f/u (he may have been put in for 6 to 8 mon) Would review how he is doing before just setting up for stress test.

## 2011-03-03 NOTE — Telephone Encounter (Signed)
Patient aware that Dr. Harrington Challenger wants to see how he is doing then MD will decide whether of not he needs to have a stress test. The F/U visit is in January 2013, patient aware.

## 2011-03-20 ENCOUNTER — Encounter: Payer: Self-pay | Admitting: Internal Medicine

## 2011-03-20 ENCOUNTER — Other Ambulatory Visit (INDEPENDENT_AMBULATORY_CARE_PROVIDER_SITE_OTHER): Payer: Medicare Other

## 2011-03-20 ENCOUNTER — Ambulatory Visit (INDEPENDENT_AMBULATORY_CARE_PROVIDER_SITE_OTHER): Payer: Medicare Other | Admitting: Internal Medicine

## 2011-03-20 VITALS — BP 100/70 | HR 68 | Temp 98.4°F | Ht 72.0 in | Wt 238.5 lb

## 2011-03-20 DIAGNOSIS — Z0000 Encounter for general adult medical examination without abnormal findings: Secondary | ICD-10-CM

## 2011-03-20 DIAGNOSIS — R972 Elevated prostate specific antigen [PSA]: Secondary | ICD-10-CM

## 2011-03-20 DIAGNOSIS — E785 Hyperlipidemia, unspecified: Secondary | ICD-10-CM

## 2011-03-20 DIAGNOSIS — Z23 Encounter for immunization: Secondary | ICD-10-CM

## 2011-03-20 DIAGNOSIS — R5383 Other fatigue: Secondary | ICD-10-CM

## 2011-03-20 DIAGNOSIS — R5381 Other malaise: Secondary | ICD-10-CM

## 2011-03-20 DIAGNOSIS — I10 Essential (primary) hypertension: Secondary | ICD-10-CM

## 2011-03-20 LAB — CBC WITH DIFFERENTIAL/PLATELET
Basophils Absolute: 0 10*3/uL (ref 0.0–0.1)
Eosinophils Absolute: 0.3 10*3/uL (ref 0.0–0.7)
HCT: 46.5 % (ref 39.0–52.0)
Hemoglobin: 15.8 g/dL (ref 13.0–17.0)
Lymphs Abs: 2 10*3/uL (ref 0.7–4.0)
MCHC: 33.9 g/dL (ref 30.0–36.0)
Monocytes Absolute: 1 10*3/uL (ref 0.1–1.0)
Monocytes Relative: 11.8 % (ref 3.0–12.0)
Neutro Abs: 5.6 10*3/uL (ref 1.4–7.7)
Platelets: 265 10*3/uL (ref 150.0–400.0)
RDW: 13.4 % (ref 11.5–14.6)

## 2011-03-20 LAB — URINALYSIS, ROUTINE W REFLEX MICROSCOPIC
Nitrite: NEGATIVE
Specific Gravity, Urine: 1.025 (ref 1.000–1.030)
Total Protein, Urine: NEGATIVE
Urine Glucose: NEGATIVE

## 2011-03-20 LAB — HEPATIC FUNCTION PANEL
AST: 30 U/L (ref 0–37)
Albumin: 3.7 g/dL (ref 3.5–5.2)
Alkaline Phosphatase: 79 U/L (ref 39–117)
Bilirubin, Direct: 0.2 mg/dL (ref 0.0–0.3)

## 2011-03-20 LAB — LIPID PANEL
Total CHOL/HDL Ratio: 5
VLDL: 25 mg/dL (ref 0.0–40.0)

## 2011-03-20 LAB — TSH: TSH: 1.83 u[IU]/mL (ref 0.35–5.50)

## 2011-03-20 LAB — BASIC METABOLIC PANEL
CO2: 24 mEq/L (ref 19–32)
GFR: 83.55 mL/min (ref >60.00)
Glucose, Bld: 95 mg/dL (ref 70–99)
Potassium: 4.3 mEq/L (ref 3.5–5.1)
Sodium: 139 mEq/L (ref 135–145)

## 2011-03-20 MED ORDER — ALPRAZOLAM 0.25 MG PO TABS: 0.2500 mg | ORAL_TABLET | Freq: Every day | ORAL | Status: DC | PRN

## 2011-03-20 MED ORDER — CITALOPRAM HYDROBROMIDE 20 MG PO TABS: 20.0000 mg | ORAL_TABLET | Freq: Every day | ORAL | Status: DC

## 2011-03-20 MED ORDER — SIMVASTATIN 40 MG PO TABS: 40.0000 mg | ORAL_TABLET | Freq: Every day | ORAL | Status: DC

## 2011-03-20 MED ORDER — LISINOPRIL 10 MG PO TABS: 10.0000 mg | ORAL_TABLET | Freq: Every day | ORAL | Status: DC

## 2011-03-20 MED ORDER — OMEPRAZOLE 40 MG PO CPDR: 40.0000 mg | DELAYED_RELEASE_CAPSULE | Freq: Every day | ORAL | Status: DC

## 2011-03-20 MED ORDER — AMLODIPINE BESYLATE 5 MG PO TABS: 5.0000 mg | ORAL_TABLET | Freq: Every day | ORAL | Status: DC

## 2011-03-20 NOTE — Patient Instructions (Signed)
Continue all other medications as before You are given the xanax at the 90 days You will be contacted regarding the referral for: colonoscopy for late January 2013 Please go to LAB in the Basement for the blood and/or urine tests to be done today Please call the phone number [phone] (the [facility]) for results of testing in 2-3 days;  When calling, simply dial the number, and when prompted enter the MRN number above (the Medical Record Number) and the # key, then the message should start.

## 2011-03-21 ENCOUNTER — Encounter: Payer: Self-pay | Admitting: Internal Medicine

## 2011-03-21 NOTE — Progress Notes (Signed)
Subjective:    Patient ID: Wayne Torres, male    DOB: 1940/11/09, 70 y.o.   MRN: [id number]  HPI   Here to f/u; overall doing ok,  Pt denies chest pain, increased sob or doe, wheezing, orthopnea, PND, increased LE swelling, palpitations, dizziness or syncope.  Pt denies new neurological symptoms such as new headache, or facial or extremity weakness or numbness   Pt denies polydipsia, polyuria, or low sugar symptoms such as weakness or confusion improved with po intake.  Pt states overall good compliance with meds, trying to follow lower cholesterol diet, wt overall stable but little exercise however.  Does have sense of ongoing fatigue, but denies signficant hypersomnolence.   Pt denies fever, wt loss, night sweats, loss of appetite, or other constitutional symptoms Past Medical History  Diagnosis Date  . GI bleed   . CAD (coronary artery disease)   . Nausea and vomiting   . Hematemesis   . Abrasion of knee, left   . Shoulder joint pain   . Chest pain     left  . Back pain   . Hip pain, right   . PSA (psoriatic arthritis)     increased  . Lumbar disc disease   . Malignant neoplasm of prostate   . Fatigue   . COPD (chronic obstructive pulmonary disease)   . Renal calculus   . Osteoarthritis   . HTN (hypertension)   . Depression   . Anxiety   . ED (erectile dysfunction)   . Hyperlipidemia   . GERD (gastroesophageal reflux disease)   . Allergic rhinitis   . Peptic stricture of esophagus    Past Surgical History  Procedure Date  . Coronary angioplasty 06/03/2007, 01/25/2009  . Coronary artery bypass graft   . Nasal septum surgery april 2012    dr britt, [facility] [place]    reports that he has quit smoking. He does not have any smokeless tobacco history on file. He reports that he does not drink alcohol or use illicit drugs. family history includes Heart attack in his father and Heart disease in his father.  There is no history of Colon cancer and Cancer. No Known  Allergies Current Outpatient Prescriptions on File Prior to Visit  Medication Sig Dispense Refill  . aspirin 81 MG tablet Take 81 mg by mouth daily.        . fish oil-omega-3 fatty acids 1000 MG capsule Take 1 capsule by mouth daily.         Review of Systems Review of Systems  Constitutional: Negative for diaphoresis and unexpected weight change.  HENT: Negative for drooling and tinnitus.   Eyes: Negative for photophobia and visual disturbance.  Respiratory: Negative for choking and stridor.   Gastrointestinal: Negative for vomiting and blood in stool.  Genitourinary: Negative for hematuria and decreased urine volume.  Musculoskeletal: Negative for gait problem.  Skin: Negative for color change and wound.  Neurological: Negative for tremors and numbness.  Psychiatric/Behavioral: Negative for decreased concentration. The patient is not hyperactive.       Objective:   Physical Exam BP 100/70  Pulse 68  Temp(Src) 98.4 F (36.9 C) (Oral)  Ht 6' (1.829 m)  Wt 238 lb 8 oz (108.183 kg)  BMI 32.35 kg/m2  SpO2 94% Physical Exam  VS noted Constitutional: Pt appears well-developed and well-nourished.  HENT: Head: Normocephalic.  Right Ear: External ear normal.  Left Ear: External ear normal.  Eyes: Conjunctivae and EOM are normal. Pupils are equal,  round, and reactive to light.  Neck: Normal range of motion. Neck supple.  Cardiovascular: Normal rate and regular rhythm.   Pulmonary/Chest: Effort normal and breath sounds normal.  Abd:  Soft, NT, non-distended, + BS Neurological: Pt is alert. No cranial nerve deficit.  Skin: Skin is warm. No erythema.  Psychiatric: Pt behavior is normal. Thought content normal.     Assessment & Plan:

## 2011-03-21 NOTE — Assessment & Plan Note (Signed)
stable overall by hx and exam, most recent data reviewed with pt, and pt to continue medical treatment as before  Lab Results  Component Value Date   PSA 1.82 03/20/2011   PSA 1.47 03/11/2010   PSA 2.35 11/03/2007

## 2011-03-21 NOTE — Assessment & Plan Note (Signed)
stable overall by hx and exam, most recent data reviewed with pt, and pt to continue medical treatment as before  BP Readings from Last 3 Encounters:  03/20/11 100/70  11/18/10 100/68  09/26/10 127/81

## 2011-03-21 NOTE — Assessment & Plan Note (Signed)
Etiology unclear, Exam otherwise benign, to check labs as documented, follow with expectant management  

## 2011-03-21 NOTE — Assessment & Plan Note (Signed)
stable overall by hx and exam, most recent data reviewed with pt, and pt to continue medical treatment as before  Lab Results  Component Value Date   LDLCALC 67 03/20/2011

## 2011-04-07 ENCOUNTER — Other Ambulatory Visit: Payer: Self-pay

## 2011-04-07 MED ORDER — FLUTICASONE PROPIONATE 50 MCG/ACT NA SUSP: 2.0000 | Freq: Every day | NASAL | Status: DC

## 2011-06-15 ENCOUNTER — Ambulatory Visit (INDEPENDENT_AMBULATORY_CARE_PROVIDER_SITE_OTHER): Payer: Medicare Other | Admitting: Internal Medicine

## 2011-06-15 ENCOUNTER — Encounter: Payer: Self-pay | Admitting: Internal Medicine

## 2011-06-15 VITALS — BP 115/67 | HR 44 | Wt 236.0 lb

## 2011-06-15 DIAGNOSIS — R001 Bradycardia, unspecified: Secondary | ICD-10-CM

## 2011-06-15 DIAGNOSIS — I2510 Atherosclerotic heart disease of native coronary artery without angina pectoris: Secondary | ICD-10-CM

## 2011-06-15 DIAGNOSIS — I498 Other specified cardiac arrhythmias: Secondary | ICD-10-CM

## 2011-06-15 DIAGNOSIS — E785 Hyperlipidemia, unspecified: Secondary | ICD-10-CM

## 2011-06-15 DIAGNOSIS — I10 Essential (primary) hypertension: Secondary | ICD-10-CM

## 2011-06-15 NOTE — Progress Notes (Signed)
HPI IMr. Wayne Torres is a 71 year old gentleman. He has a history of coronary artery disease. I last saw the patient back in May of last year. Last cardiac catheterization (11/2008) showed 100% left main RCA was diffusely diseased 50-60% proximal 50% mid. Vessel is occluded after the PDA, SVG RCA was occluded ostially, SVG to ramus was ectatic with 30% blockage in the midsection. SVG to diagonal was ectatic 40% lesion. Diagonal was patent. Upper branch had a 99% stenosis, but was a small vessel. There were left to right collaterals. Left subclavian tortuous. No flow-limiting plaque, LIMA to LAD was patent. LAD was small. Abdominal aortogram showed patent renal arteries. 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 Since seen he denies signif CP.  No dizziness.  Breathing is OK  Note LDL in October was very well controlled.   No Known Allergies  Current Outpatient Prescriptions  Medication Sig Dispense Refill  . ALPRAZolam (XANAX) 0.25 MG tablet Take 1 tablet (0.25 mg total) by mouth daily as needed.  90 tablet  1  . amLODipine (NORVASC) 5 MG tablet Take 1 tablet (5 mg total) by mouth daily.  90 tablet  3  . aspirin 81 MG tablet Take 81 mg by mouth daily.        . citalopram (CELEXA) 20 MG tablet Take 1 tablet (20 mg total) by mouth daily.  90 tablet  3  . fish oil-omega-3 fatty acids 1000 MG capsule Take 1 capsule by mouth daily.        . fluticasone (FLONASE) 50 MCG/ACT nasal spray Place 2 sprays into the nose daily.  48 g  1  . lisinopril (PRINIVIL,ZESTRIL) 10 MG tablet Take 1 tablet (10 mg total) by mouth daily.  90 tablet  3  . omeprazole (PRILOSEC) 40 MG capsule Take 1 capsule (40 mg total) by mouth daily.  90 capsule  3  . simvastatin (ZOCOR) 40 MG tablet Take 1 tablet (40 mg total) by mouth at bedtime.  90 tablet  3    Past Medical History  Diagnosis Date  . GI bleed   . CAD (coronary  artery disease)   . Nausea and vomiting   . Hematemesis   . Abrasion of knee, left   . Shoulder joint pain   . Chest pain     left  . Back pain   . Hip pain, right   . PSA (psoriatic arthritis)     increased  . Lumbar disc disease   . Recurrent prostate adenocarcinoma   . Fatigue   . COPD (chronic obstructive pulmonary disease)   . Renal calculus   . Osteoarthritis   . HTN (hypertension)   . Depression   . Anxiety   . ED (erectile dysfunction)   . Hyperlipidemia   . GERD (gastroesophageal reflux disease)   . Allergic rhinitis   . Peptic stricture of esophagus     Past Surgical History  Procedure Date  . Coronary angioplasty 06/03/2007, 01/25/2009  . Coronary artery bypass graft   . Nasal septum surgery april 2012    dr britt, [facility] [place]    Family History  Problem Relation Age of Onset  . Heart attack Father   . Heart disease Father   . Colon cancer Neg Hx   . Cancer Neg Hx     Colon    History   Social History  . Marital Status: Married  Spouse Name: N/A    Number of Children: N/A  . Years of Education: N/A   Occupational History  . Retired    Social History Main Topics  . Smoking status: Former Research scientist (life sciences)  . Smokeless tobacco: Not on file   Comment: Pt does not get regular exercise  . Alcohol Use: No  . Drug Use: No  . Sexually Active: Not on file   Other Topics Concern  . Not on file   Social History Narrative  . No narrative on file    Review of Systems:  All systems reviewed.  They are negative to the above problem except as previously stated.  Vital Signs: BP 115/67  Pulse 44  Wt 236 lb (107.049 kg)  Physical Exam Patient is in NAD  HEENT:  Normocephalic, atraumatic. EOMI, PERRLA.  Neck: JVP is normal. No thyromegaly. No bruits.  Lungs: clear to auscultation. No rales no wheezes.  Heart: Regular rate and rhythm. Normal S1, S2. No S3.   No significant murmurs. PMI not displaced.  Abdomen:  Supple, nontender. Normal bowel sounds.  No masses. No hepatomegaly.  Extremities:  No lower extremity edema.  Musculoskeletal :moving all extremities.  Neuro:   alert and oriented x3.  CN II-XII grossly intact.  EKG:  Sinus bradycardia.  44 bpm.  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  T wave inversion in V1 to V6.  Inverior MI.     Assessment and Plan:

## 2011-06-15 NOTE — Patient Instructions (Signed)
Your physician has recommended that you wear a holter monitor. Holter monitors are medical devices that record the heart's electrical activity. Doctors most often use these monitors to diagnose arrhythmias. Arrhythmias are problems with the speed or rhythm of the heartbeat. The monitor is a small, portable device. You can wear one while you do your normal daily activities. This is usually used to diagnose what is causing palpitations/syncope (passing out).  Your physician wants you to follow-up in:10 months You will receive a reminder letter in the mail two months in advance. If you don't receive a letter, please call our office to schedule the follow-up appointment.

## 2011-06-16 DIAGNOSIS — R001 Bradycardia, unspecified: Secondary | ICD-10-CM | POA: Insufficient documentation

## 2011-06-16 NOTE — Assessment & Plan Note (Signed)
Will set up for holter monitor to eval 24 hour avg.

## 2011-06-16 NOTE — Assessment & Plan Note (Signed)
LDL on last check was good.  HDL was low.  Keep on same regimen.

## 2011-06-16 NOTE — Assessment & Plan Note (Signed)
No symptoms to sugg angina.

## 2011-06-16 NOTE — Assessment & Plan Note (Signed)
Well controlled 

## 2011-06-24 NOTE — Progress Notes (Signed)
Addended by: Doug Sou D on: 06/24/2011 11:27 AM   Modules accepted: Orders

## 2011-06-30 ENCOUNTER — Encounter (INDEPENDENT_AMBULATORY_CARE_PROVIDER_SITE_OTHER): Payer: Medicare Other

## 2011-06-30 DIAGNOSIS — I498 Other specified cardiac arrhythmias: Secondary | ICD-10-CM

## 2011-07-09 ENCOUNTER — Encounter: Payer: Self-pay | Admitting: Gastroenterology

## 2011-07-13 ENCOUNTER — Telehealth: Payer: Self-pay | Admitting: *Deleted

## 2011-07-13 NOTE — Telephone Encounter (Signed)
Called patient's wife with holter monitor results. ( He was resting after dental surgery). Monitor showed sinus rhythm rate from 31 to 74 with an average heart rate of 43 with the longest pause of 2.9 seconds. Patient had denied any symptoms. Advised her to have him call us with any symptoms of dizziness or fatigue. She will pass the message to him.

## 2011-08-03 ENCOUNTER — Other Ambulatory Visit: Payer: Self-pay | Admitting: *Deleted

## 2011-08-03 MED ORDER — OMEPRAZOLE 40 MG PO CPDR: 40.0000 mg | DELAYED_RELEASE_CAPSULE | Freq: Every day | ORAL | Status: DC

## 2011-08-03 MED ORDER — LISINOPRIL 10 MG PO TABS: 10.0000 mg | ORAL_TABLET | Freq: Every day | ORAL | Status: DC

## 2011-08-03 MED ORDER — CITALOPRAM HYDROBROMIDE 20 MG PO TABS: 20.0000 mg | ORAL_TABLET | Freq: Every day | ORAL | Status: DC

## 2011-08-03 MED ORDER — SIMVASTATIN 40 MG PO TABS: 40.0000 mg | ORAL_TABLET | Freq: Every day | ORAL | Status: DC

## 2011-08-03 MED ORDER — ALPRAZOLAM 0.25 MG PO TABS: 0.2500 mg | ORAL_TABLET | Freq: Every day | ORAL | Status: DC | PRN

## 2011-08-03 MED ORDER — AMLODIPINE BESYLATE 5 MG PO TABS: 5.0000 mg | ORAL_TABLET | Freq: Every day | ORAL | Status: DC

## 2011-08-03 NOTE — Telephone Encounter (Signed)
Done hardcopy to robin

## 2011-08-03 NOTE — Telephone Encounter (Signed)
Patient's insurance changed and needs new Rx's sent to [facility] in [place].    He says his Citalopram 20 mg is no longer working for him.  It is not controlling his anxiety attacks.  He is asking if the dosage could be increased or med changed?

## 2011-08-04 NOTE — Telephone Encounter (Signed)
Faxed hardcopy to pharmacy. 

## 2011-08-10 ENCOUNTER — Telehealth: Payer: Self-pay | Admitting: Internal Medicine

## 2011-08-10 NOTE — Telephone Encounter (Signed)
Please return call to patient at [phone]   Patient c/o of being winded over the last couple weeks.  Please return call to patient  At [phone]

## 2011-08-10 NOTE — Telephone Encounter (Signed)
Called patient again and advised that he needs a GXT test per Dr.Ross. Patient states that he can't walk on a treadmill because of a " bad back and hip problems". Advised that we probably need to change his medications. Advised him to come to office to be seen on 3/27 at 845 am by Dr.Ross for an exam. He agreed with this plan.

## 2011-08-10 NOTE — Telephone Encounter (Signed)
LM for C/B.

## 2011-08-10 NOTE — Telephone Encounter (Signed)
Set up for regular GXT to see HR response to exercise.

## 2011-08-10 NOTE — Telephone Encounter (Signed)
Called patient back. He states that he becomes SOB when using heavy equipment for the past 2 months. No complaints of chest pain. Advised will discuss with Dr.Ross and cal him back.

## 2011-08-10 NOTE — Telephone Encounter (Signed)
Pt rtn call

## 2011-08-14 ENCOUNTER — Ambulatory Visit (INDEPENDENT_AMBULATORY_CARE_PROVIDER_SITE_OTHER): Payer: Medicare Other | Admitting: Internal Medicine

## 2011-08-14 ENCOUNTER — Encounter: Payer: Self-pay | Admitting: Internal Medicine

## 2011-08-14 VITALS — BP 122/68 | HR 50 | Ht 72.0 in | Wt 229.0 lb

## 2011-08-14 DIAGNOSIS — R001 Bradycardia, unspecified: Secondary | ICD-10-CM

## 2011-08-14 DIAGNOSIS — I10 Essential (primary) hypertension: Secondary | ICD-10-CM

## 2011-08-14 DIAGNOSIS — I498 Other specified cardiac arrhythmias: Secondary | ICD-10-CM

## 2011-08-14 DIAGNOSIS — E785 Hyperlipidemia, unspecified: Secondary | ICD-10-CM

## 2011-08-14 DIAGNOSIS — I2510 Atherosclerotic heart disease of native coronary artery without angina pectoris: Secondary | ICD-10-CM

## 2011-08-14 NOTE — Progress Notes (Signed)
HPIIMr. Wayne Torres is a 71 year old gentleman. He has a history of coronary artery disease. I last saw the patient back in May of last year. Last cardiac catheterization (11/2008) showed 100% left main RCA was diffusely diseased 50-60% proximal 50% mid. Vessel is occluded after the PDA, SVG RCA was occluded ostially, SVG to ramus was ectatic with 30% blockage in the midsection. SVG to diagonal was ectatic 40% lesion. Diagonal was patent. Upper branch had a 99% stenosis, but was a small vessel. There were left to right collaterals. Left subclavian tortuous. No flow-limiting plaque, LIMA to LAD was patent. LAD was small. Abdominal aortogram showed patent renal arteries. 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  I saw the patient in January.  At that time he was doing well. EKG showed bradycardia.  I went ahead and had him wear a 24 hour holter monitor.  This showed HR from 31 to 74 bpm.  Average HR was 43.  Longest pause was 2.9 seconds.Rare PVC.  37 couplets.No Known Allergies He called in and said he was having some SOB with activity.  On talking to him today he only gets SOB and dizzy when he is bending then getting up.  When he is walking or doing things on a level surface he does not have symptoms. Remains active.  No change in his ability to do things. Current Outpatient Prescriptions  Medication Sig Dispense Refill  . ALPRAZolam (XANAX) 0.25 MG tablet Take 1 tablet (0.25 mg total) by mouth daily as needed.  90 tablet  1  . amLODipine (NORVASC) 5 MG tablet Take 1 tablet (5 mg total) by mouth daily.  90 tablet  3  . aspirin 81 MG tablet Take 81 mg by mouth daily.        . citalopram (CELEXA) 20 MG tablet Take 1 tablet (20 mg total) by mouth daily.  90 tablet  3  . fish oil-omega-3 fatty acids 1000 MG capsule Take 1 capsule by mouth daily.        . fluticasone (FLONASE) 50 MCG/ACT nasal spray Place  2 sprays into the nose daily.  48 g  1  . lisinopril (PRINIVIL,ZESTRIL) 10 MG tablet Take 1 tablet (10 mg total) by mouth daily.  90 tablet  3  . omeprazole (PRILOSEC) 40 MG capsule Take 1 capsule (40 mg total) by mouth daily.  90 capsule  3  . simvastatin (ZOCOR) 40 MG tablet Take 1 tablet (40 mg total) by mouth at bedtime.  90 tablet  3    Past Medical History  Diagnosis Date  . GI bleed   . CAD (coronary artery disease)   . Nausea and vomiting   . Hematemesis   . Abrasion of knee, left   . Shoulder joint pain   . Chest pain     left  . Back pain   . Hip pain, right   . PSA (psoriatic arthritis)     increased  . Lumbar disc disease   . Malignant neoplasm of prostate   . Fatigue   . COPD (chronic obstructive pulmonary disease)   . Renal calculus   . Osteoarthritis   . HTN (hypertension)   . Depression   . Anxiety   . ED (erectile dysfunction)   . Hyperlipidemia   . GERD (gastroesophageal reflux disease)   . Allergic rhinitis   . Peptic stricture of esophagus  Past Surgical History  Procedure Date  . Coronary angioplasty 06/03/2007, 01/25/2009  . Coronary artery bypass graft   . Nasal septum surgery april 2012    dr britt, [facility] [place]    Family History  Problem Relation Age of Onset  . Heart attack Father   . Heart disease Father   . Colon cancer Neg Hx   . Cancer Neg Hx     Colon    History   Social History  . Marital Status: Married    Spouse Name: N/A    Number of Children: N/A  . Years of Education: N/A   Occupational History  . Retired    Social History Main Topics  . Smoking status: Former Research scientist (life sciences)  . Smokeless tobacco: Not on file   Comment: Pt does not get regular exercise  . Alcohol Use: No  . Drug Use: No  . Sexually Active: Not on file   Other Topics Concern  . Not on file   Social History Narrative  . No narrative on file    Review of Systems:  All systems reviewed.  They are negative to the above problem except as previously  stated.  Vital Signs: BP 122/68  Pulse 50  Ht 6' (1.829 m)  Wt 229 lb (103.874 kg)  BMI 31.06 kg/m2  Physical Exam Patient is in NAD  HEENT:  Normocephalic, atraumatic. EOMI, PERRLA.  Neck: JVP is normal. No thyromegaly. No bruits.  Lungs: clear to auscultation. No rales no wheezes.  Heart: Regular rate and rhythm. Normal S1, S2. No S3.   No significant murmurs. PMI not displaced.  Abdomen:  Supple, nontender. Normal bowel sounds. No masses. No hepatomegaly.  Extremities:   Good distal pulses throughout. No lower extremity edema.  Musculoskeletal :moving all extremities.  Neuro:   alert and oriented x3.  CN II-XII grossly intact.   Assessment and Plan:

## 2011-08-16 NOTE — Assessment & Plan Note (Signed)
Keep on current regimen.

## 2011-08-16 NOTE — Assessment & Plan Note (Signed)
Follow.

## 2011-08-16 NOTE — Assessment & Plan Note (Signed)
I am not convinced the patient is having any signif symptoms.  ONly feels SOB or dizzy with bending/stooping. I would not change anything  WIll continue to follow.

## 2011-08-16 NOTE — Assessment & Plan Note (Signed)
REmains asymptomatic.

## 2011-08-19 ENCOUNTER — Encounter: Payer: Medicare Other | Admitting: Physician Assistant

## 2011-08-24 HISTORY — PX: PACEMAKER PLACEMENT: SHX43

## 2011-09-11 DIAGNOSIS — I10 Essential (primary) hypertension: Secondary | ICD-10-CM | POA: Insufficient documentation

## 2011-09-11 DIAGNOSIS — I739 Peripheral vascular disease, unspecified: Secondary | ICD-10-CM | POA: Insufficient documentation

## 2011-09-11 DIAGNOSIS — Z951 Presence of aortocoronary bypass graft: Secondary | ICD-10-CM | POA: Insufficient documentation

## 2011-09-12 ENCOUNTER — Inpatient Hospital Stay (HOSPITAL_COMMUNITY)
Admission: EM | Admit: 2011-09-12 | Discharge: 2011-09-15 | DRG: 244 | Disposition: A | Discharge status: Home / Self Care | Payer: Medicare Other | Source: Other Acute Inpatient Hospital | Attending: Cardiology | Admitting: Cardiology

## 2011-09-12 ENCOUNTER — Inpatient Hospital Stay (HOSPITAL_COMMUNITY): Payer: Medicare Other

## 2011-09-12 ENCOUNTER — Encounter (HOSPITAL_COMMUNITY): Payer: Self-pay

## 2011-09-12 DIAGNOSIS — J449 Chronic obstructive pulmonary disease, unspecified: Secondary | ICD-10-CM | POA: Diagnosis present

## 2011-09-12 DIAGNOSIS — F341 Dysthymic disorder: Secondary | ICD-10-CM | POA: Diagnosis present

## 2011-09-12 DIAGNOSIS — I442 Atrioventricular block, complete: Secondary | ICD-10-CM

## 2011-09-12 DIAGNOSIS — E669 Obesity, unspecified: Secondary | ICD-10-CM | POA: Diagnosis present

## 2011-09-12 DIAGNOSIS — M1990 Unspecified osteoarthritis, unspecified site: Secondary | ICD-10-CM | POA: Diagnosis present

## 2011-09-12 DIAGNOSIS — I2510 Atherosclerotic heart disease of native coronary artery without angina pectoris: Secondary | ICD-10-CM | POA: Diagnosis present

## 2011-09-12 DIAGNOSIS — E785 Hyperlipidemia, unspecified: Secondary | ICD-10-CM | POA: Diagnosis present

## 2011-09-12 DIAGNOSIS — Z951 Presence of aortocoronary bypass graft: Secondary | ICD-10-CM

## 2011-09-12 DIAGNOSIS — R001 Bradycardia, unspecified: Secondary | ICD-10-CM

## 2011-09-12 DIAGNOSIS — J4489 Other specified chronic obstructive pulmonary disease: Secondary | ICD-10-CM | POA: Diagnosis present

## 2011-09-12 DIAGNOSIS — K219 Gastro-esophageal reflux disease without esophagitis: Secondary | ICD-10-CM | POA: Diagnosis present

## 2011-09-12 DIAGNOSIS — F411 Generalized anxiety disorder: Secondary | ICD-10-CM | POA: Diagnosis present

## 2011-09-12 DIAGNOSIS — C61 Malignant neoplasm of prostate: Secondary | ICD-10-CM | POA: Diagnosis present

## 2011-09-12 DIAGNOSIS — Z9861 Coronary angioplasty status: Secondary | ICD-10-CM

## 2011-09-12 LAB — CBC
MCV: 89.1 fL (ref 78.0–100.0)
Platelets: 242 10*3/uL (ref 150–400)
RBC: 4.68 MIL/uL (ref 4.22–5.81)
RDW: 13.6 % (ref 11.5–15.5)
WBC: 8.2 10*3/uL (ref 4.0–10.5)

## 2011-09-12 LAB — COMPREHENSIVE METABOLIC PANEL
ALT: 8 U/L (ref 0–53)
AST: 12 U/L (ref 0–37)
Albumin: 3.7 g/dL (ref 3.5–5.2)
Alkaline Phosphatase: 77 U/L (ref 39–117)
CO2: 26 mEq/L (ref 19–32)
Chloride: 105 mEq/L (ref 96–112)
Creatinine, Ser: 0.97 mg/dL (ref 0.50–1.35)
GFR calc non Af Amer: 82 mL/min — ABNORMAL LOW (ref >90)
Potassium: 4.3 mEq/L (ref 3.5–5.1)
Total Bilirubin: 0.6 mg/dL (ref 0.3–1.2)

## 2011-09-12 LAB — PROTIME-INR: INR: 1.11 (ref 0.00–1.49)

## 2011-09-12 LAB — MRSA PCR SCREENING: MRSA by PCR: NEGATIVE

## 2011-09-12 LAB — TSH: TSH: 1.464 u[IU]/mL (ref 0.350–4.500)

## 2011-09-12 MED ORDER — ALPRAZOLAM 0.25 MG PO TABS
0.2500 mg | ORAL_TABLET | Freq: Three times a day (TID) | ORAL | Status: DC | PRN
  Administered 2011-09-12 – 2011-09-14 (×4): 0.25 mg via ORAL
  Filled 2011-09-12 (×3): qty 1

## 2011-09-12 MED ORDER — ALPRAZOLAM 0.25 MG PO TABS
0.2500 mg | ORAL_TABLET | Freq: Every day | ORAL | Status: DC | PRN
  Administered 2011-09-12: 0.25 mg via ORAL
  Filled 2011-09-12 (×2): qty 1

## 2011-09-12 MED ORDER — SIMVASTATIN 40 MG PO TABS
40.0000 mg | ORAL_TABLET | Freq: Every day | ORAL | Status: DC
  Administered 2011-09-12 – 2011-09-14 (×3): 40 mg via ORAL
  Filled 2011-09-12 (×5): qty 1

## 2011-09-12 MED ORDER — BIOTENE DRY MOUTH MT LIQD
15.0000 mL | Freq: Two times a day (BID) | OROMUCOSAL | Status: DC
  Administered 2011-09-12 – 2011-09-15 (×7): 15 mL via OROMUCOSAL

## 2011-09-12 MED ORDER — ASPIRIN EC 81 MG PO TBEC: 81.0000 mg | DELAYED_RELEASE_TABLET | Freq: Every day | ORAL | Status: DC

## 2011-09-12 MED ORDER — ACETAMINOPHEN 650 MG RE SUPP: 650.0000 mg | Freq: Four times a day (QID) | RECTAL | Status: DC | PRN

## 2011-09-12 MED ORDER — AMOXICILLIN-POT CLAVULANATE 875-125 MG PO TABS
1.0000 | ORAL_TABLET | Freq: Two times a day (BID) | ORAL | Status: DC
  Administered 2011-09-12 – 2011-09-15 (×7): 1 via ORAL
  Filled 2011-09-12 (×10): qty 1

## 2011-09-12 MED ORDER — SODIUM CHLORIDE 0.9 % IJ SOLN
3.0000 mL | Freq: Two times a day (BID) | INTRAMUSCULAR | Status: DC
  Administered 2011-09-12 – 2011-09-13 (×4): 3 mL via INTRAVENOUS

## 2011-09-12 MED ORDER — SODIUM CHLORIDE 0.9 % IJ SOLN
3.0000 mL | INTRAMUSCULAR | Status: DC | PRN
  Administered 2011-09-15: 3 mL via INTRAVENOUS

## 2011-09-12 MED ORDER — HEPARIN SODIUM (PORCINE) 5000 UNIT/ML IJ SOLN
5000.0000 [IU] | Freq: Three times a day (TID) | INTRAMUSCULAR | Status: DC
  Administered 2011-09-12 – 2011-09-13 (×5): 5000 [IU] via SUBCUTANEOUS
  Filled 2011-09-12 (×9): qty 1

## 2011-09-12 MED ORDER — ACETAMINOPHEN 325 MG PO TABS
650.0000 mg | ORAL_TABLET | Freq: Four times a day (QID) | ORAL | Status: DC | PRN
  Administered 2011-09-12: 650 mg via ORAL
  Filled 2011-09-12: qty 2

## 2011-09-12 MED ORDER — SODIUM CHLORIDE 0.9 % IV SOLN: 250.0000 mL | INTRAVENOUS | Status: DC | PRN

## 2011-09-12 MED ORDER — SODIUM CHLORIDE 0.9 % IJ SOLN
3.0000 mL | Freq: Two times a day (BID) | INTRAMUSCULAR | Status: DC
  Administered 2011-09-12 – 2011-09-14 (×2): 3 mL via INTRAVENOUS

## 2011-09-12 MED ORDER — PANTOPRAZOLE SODIUM 40 MG PO TBEC
80.0000 mg | DELAYED_RELEASE_TABLET | Freq: Every day | ORAL | Status: DC
  Administered 2011-09-12 – 2011-09-15 (×4): 80 mg via ORAL
  Filled 2011-09-12: qty 2
  Filled 2011-09-12: qty 1
  Filled 2011-09-12: qty 2
  Filled 2011-09-12: qty 1
  Filled 2011-09-12: qty 2

## 2011-09-12 MED ORDER — FLUTICASONE PROPIONATE HFA 44 MCG/ACT IN AERO
2.0000 | INHALATION_SPRAY | Freq: Two times a day (BID) | RESPIRATORY_TRACT | Status: DC
  Administered 2011-09-12 – 2011-09-15 (×7): 2 via RESPIRATORY_TRACT
  Filled 2011-09-12: qty 10.6

## 2011-09-12 MED ORDER — ASPIRIN EC 325 MG PO TBEC
325.0000 mg | DELAYED_RELEASE_TABLET | Freq: Every day | ORAL | Status: DC
  Administered 2011-09-12 – 2011-09-15 (×4): 325 mg via ORAL
  Filled 2011-09-12 (×5): qty 1

## 2011-09-12 MED ORDER — OMEGA-3-ACID ETHYL ESTERS 1 G PO CAPS
1.0000 g | ORAL_CAPSULE | Freq: Two times a day (BID) | ORAL | Status: DC
  Administered 2011-09-12 – 2011-09-15 (×7): 1 g via ORAL
  Filled 2011-09-12 (×8): qty 1

## 2011-09-12 NOTE — Progress Notes (Signed)
Patient seen and examined. Admitted with symptomatic bradycardia in setting of CHB. Now with rates in 40-50s. BP stable. Cath 2010 with patent CABG grafts. No recent angina.   Will get echo. Keep in CCU. Avoid AV nodal blockers. Pacer Monday.

## 2011-09-12 NOTE — H&P (Signed)
Wayne Torres is an 71 y.o. male with a history of CAD s/p remote CABG and hx of bradycardia.  Chief Complaint: Shortness of breath and fatigue HPI: Patient was at rest and not engaging in any particularly strenuous activity yesterday when he experienced a sudden onset of SOB which was relieved by NTG. He had another bout soon afterwards which was the reason he went to see his PCP. There he was found in complete heart block and subsequently transferred here. His EKG there shows high grade heart block with either inferior Q waves or LAFB. He had an old IVCD of 168m which is the same as his escape rhythm. Telemonitoring here revealed a sinus rate in the 80s with complete heart block, an escape rhythm in the high 30s and evidence of intermittent sinus beat capture. Notably his BP has been normal to high and he has not had any significant pauses. The feeling he reported today has been going on for about 3 days. Of note, he is a patient of Dr. PDorris Carnesand has been followed for sinus bradycardia and marked first degree AV block of about 280 to 360 ms. 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 At the time, he denied any symptoms so a pacemaker was not recommended. At this time, he denies syncope, admits to rare lightheadedness but no presyncope. He claims he used to walk on his treadmill for 128ms at 2.56m82mbut stopped that about 2 months ago due to Hip/joint pain. He denies orthopnea and leg edema but admits to PND. It appears that his he is having progressive AV nodal disease with some symptoms. I believe he is underreporting his symptoms. I informed him that he needs a pacemaker and it is possible that with implantation, he will realize how limited he has been with regards to exercise tolerance. Besides the above, his cardiac hx is significant for CABG X 4 about 20 yrs ago. His last cath in October  2010 revealed 3 patent grafts (mostly ectatic with 20-30% stenosis) and one occluded graft to PDA. There were left to right collaterals. No intervention was done. This was complicated by right iliac artery dissection.  Past Medical History  Diagnosis Date  . GI bleed   . CAD (coronary artery disease)   . Nausea and vomiting   . Hematemesis   . Abrasion of knee, left   . Shoulder joint pain   . Chest pain     left  . Back pain   . Hip pain, right   . PSA (psoriatic arthritis)     increased  . Lumbar disc disease   . Malignant neoplasm of prostate   . Fatigue   . COPD (chronic obstructive pulmonary disease)   . Renal calculus   . Osteoarthritis   . HTN (hypertension)   . Depression   . Anxiety   . ED (erectile dysfunction)   . Hyperlipidemia   . GERD (gastroesophageal reflux disease)   . Allergic rhinitis   . Peptic stricture of esophagus     Past Surgical History  Procedure Date  . Coronary angioplasty 06/03/2007, 01/25/2009  . Coronary artery bypass graft   . Nasal septum surgery april 2012    dr britt, [facility] [place]    Family History  Problem Relation Age of Onset  . Heart attack Father   . Heart disease Father   .  Colon cancer Neg Hx   . Cancer Neg Hx     Colon   Social History:  reports that he has quit smoking. He does not have any smokeless tobacco history on file. He reports that he does not drink alcohol or use illicit drugs.  Allergies: No Known Allergies  No current facility-administered medications on file as of 09/12/2011.   Medications Prior to Admission  Medication Sig Dispense Refill  . ALPRAZolam (XANAX) 0.25 MG tablet Take 1 tablet (0.25 mg total) by mouth daily as needed.  90 tablet  1  . amLODipine (NORVASC) 5 MG tablet Take 1 tablet (5 mg total) by mouth daily.  90 tablet  3  . aspirin 81 MG tablet Take 81 mg by mouth daily.        . citalopram (CELEXA) 20 MG tablet Take 1 tablet (20 mg total) by mouth daily.  90 tablet  3  . fish  oil-omega-3 fatty acids 1000 MG capsule Take 1 capsule by mouth daily.        . fluticasone (FLONASE) 50 MCG/ACT nasal spray Place 2 sprays into the nose daily.  48 g  1  . lisinopril (PRINIVIL,ZESTRIL) 10 MG tablet Take 1 tablet (10 mg total) by mouth daily.  90 tablet  3  . omeprazole (PRILOSEC) 40 MG capsule Take 1 capsule (40 mg total) by mouth daily.  90 capsule  3  . simvastatin (ZOCOR) 40 MG tablet Take 1 tablet (40 mg total) by mouth at bedtime.  90 tablet  3    No results found for this or any previous visit (from the past 48 hour(s)). No results found.  Review of Systems  HENT: Negative.   Eyes: Negative.   Respiratory: Positive for shortness of breath.   Cardiovascular: Positive for PND.  Gastrointestinal: Negative.   Genitourinary: Negative.   Musculoskeletal: Positive for joint pain.  Skin: Negative.   Neurological: Positive for dizziness and weakness.       Rare  Endo/Heme/Allergies: Negative.   Psychiatric/Behavioral: Negative.     Blood pressure 145/58, pulse 49, temperature 97.8 F (36.6 C), temperature source Oral, resp. rate 18, height 6' (1.829 m), weight 230 lb 13.2 oz (104.7 kg), SpO2 94.00%. Physical Exam  Constitutional: He is oriented to person, place, and time. He appears well-developed. No distress.  HENT:  Head: Normocephalic.  Mouth/Throat: Oropharynx is clear and moist.  Eyes: Conjunctivae are normal. No scleral icterus.  Neck: No JVD present. No tracheal deviation present. No thyromegaly present.  Cardiovascular: S2 normal.  An irregular rhythm present. Bradycardia present.  PMI is not displaced.  Exam reveals no gallop, no S3, no S4, no distant heart sounds and no friction rub.   Murmur heard.  Systolic murmur is present with a grade of 1/6  Pulses:      Posterior tibial pulses are 1+ on the right side, and 1+ on the left side.       Varying pulse volume.  Respiratory: No stridor. Not tachypneic and not bradypneic. No respiratory distress. He  has decreased breath sounds in the right lower field. He has rhonchi in the left upper field. He has rales in the right lower field.    GI: Soft. Normal appearance. He exhibits no mass. There is no tenderness. There is no rigidity and no guarding.  Genitourinary:       Deferred  Musculoskeletal: He exhibits no edema and no tenderness.  Neurological: He is alert and oriented to person, place, and time.  Skin:  Skin is warm and dry. He is not diaphoretic. No erythema.  Psychiatric: He has a normal mood and affect.       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  Patient needs a permanent pacemaker. He is agreeable to same. Earliest he can receive this is Monday. In the meantime he is hemodynamically stable not requiring a transvenous pacemaker. We have placed pacer pads on him for emergency pacing. We will avoid nodal blocking meds. We will tolerate slightly elevated BP so as to limit medications that affect hemodynamics in general. He will remain in the CCU until Monday.     Alric Ran 09/12/2011, 4:53 AM

## 2011-09-13 DIAGNOSIS — I2510 Atherosclerotic heart disease of native coronary artery without angina pectoris: Secondary | ICD-10-CM

## 2011-09-13 DIAGNOSIS — I498 Other specified cardiac arrhythmias: Secondary | ICD-10-CM

## 2011-09-13 NOTE — Progress Notes (Signed)
  Echocardiogram 2D Echocardiogram has been performed.  Mellony Danziger, Orlena Sheldon 09/13/2011, 12:23 PM

## 2011-09-13 NOTE — Progress Notes (Signed)
Subjective:   Feels fine. A bit anxious at times. Remains with CHB with rates 30s-50s. BP stable. No syncope/CP/SOB.  Intake/Output Summary (Last 24 hours) at 09/13/11 0802 Last data filed at 09/13/11 0600  Gross per 24 hour  Intake   1203 ml  Output   2725 ml  Net  -1522 ml    Current meds:    . amoxicillin-clavulanate  1 tablet Oral BID  . antiseptic oral rinse  15 mL Mouth Rinse BID  . aspirin EC  325 mg Oral Daily  . fluticasone  2 puff Inhalation BID  . heparin  5,000 Units Subcutaneous Q8H  . omega-3 acid ethyl esters  1 g Oral BID  . pantoprazole  80 mg Oral Q1200  . simvastatin  40 mg Oral q1800  . sodium chloride  3 mL Intravenous Q12H  . sodium chloride  3 mL Intravenous Q12H   Infusions:     Objective:  Blood pressure 124/65, pulse 35, temperature 97.9 F (36.6 C), temperature source Oral, resp. rate 16, height 6' (1.829 m), weight 99.9 kg (220 lb 3.8 oz), SpO2 94.00%. Weight change: -4.8 kg (-10 lb 9.3 oz)  Physical Exam: General:  Well appearing. No resp difficulty HEENT: normal Neck: supple. JVP flat. Carotids 2+ bilat; no bruits. No lymphadenopathy or thryomegaly appreciated. Cor: PMI nondisplaced. Brady Regular No rubs, gallops or murmurs. Lungs: clear Abdomen: obese soft, nontender, nondistended. Extremities: no cyanosis, clubbing, rash, edema Neuro: alert & orientedx3, cranial nerves grossly intact. moves all 4 extremities w/o difficulty. Affect pleasant  Telemetry: SR with CHB rates 30s-50s (lowest 33)  Lab Results: Basic Metabolic Panel:  Lab 26/41/58 0920  NA 139  K 4.3  CL 105  CO2 26  GLUCOSE 104*  BUN 9  CREATININE 0.97  CALCIUM 9.2  MG 1.9  PHOS --   Liver Function Tests:  Lab 09/12/11 0920  AST 12  ALT 8  ALKPHOS 77  BILITOT 0.6  PROT 7.0  ALBUMIN 3.7   No results found for this basename: LIPASE:5,AMYLASE:5 in the last 168 hours No results found for this basename: AMMONIA:5 in the last 168 hours CBC:  Lab  09/12/11 0920  WBC 8.2  NEUTROABS --  HGB 14.4  HCT 41.7  MCV 89.1  PLT 242   Cardiac Enzymes: No results found for this basename: CKTOTAL:5,CKMB:5,CKMBINDEX:5,TROPONINI:5 in the last 168 hours BNP: No components found with this basename: POCBNP:5 CBG: No results found for this basename: GLUCAP:5 in the last 168 hours Microbiology: No results found for this basename: cult   No results found for this basename: CULT:2,SDES:2 in the last 168 hours  Imaging: Portable Chest 1 View  09/12/2011  *RADIOLOGY REPORT*  Clinical Data: 71 year old male with heart block.  PORTABLE CHEST - 1 VIEW  Comparison: 01/25/2009 and earlier.  Findings: AP portable upright view 0805 hours.  Chronic cardiomegaly.  Stable mediastinal contours.  Sequelae of CABG. Stable increased interstitial markings.  No pneumothorax, pleural effusion, consolidation or definite edema.  IMPRESSION: Chronic cardiomegaly and interstitial lung changes. No acute cardiopulmonary abnormality.  Original Report Authenticated By: Randall An, M.D.     ASSESSMENT:  1. Symptomatic bradycardia 2. CHB 3. CAD s/p remote CABG 20y ago     --cath 2010 - ok   PLAN/DISCUSSION:  He is stable. We have made NPO for pacer tomorrow and placed on the board. I described the procedure to him to the best of my ability. Echo pending. Labs ok.    LOS: 1 day  Glori Bickers, MD 09/13/2011, 8:02 AM

## 2011-09-14 ENCOUNTER — Encounter (HOSPITAL_COMMUNITY)
Admission: EM | Disposition: A | Discharge status: Home / Self Care | Payer: Self-pay | Source: Other Acute Inpatient Hospital | Attending: Cardiology

## 2011-09-14 DIAGNOSIS — I442 Atrioventricular block, complete: Secondary | ICD-10-CM

## 2011-09-14 HISTORY — PX: PERMANENT PACEMAKER INSERTION: SHX5480

## 2011-09-14 LAB — CARDIAC PANEL(CRET KIN+CKTOT+MB+TROPI)
CK, MB: 2.2 ng/mL (ref 0.3–4.0)
Relative Index: 2 (ref 0.0–2.5)
Total CK: 111 U/L (ref 7–232)
Troponin I: <0.3 ng/mL

## 2011-09-14 SURGERY — PERMANENT PACEMAKER INSERTION
Anesthesia: LOCAL

## 2011-09-14 MED ORDER — LIDOCAINE HCL (PF) 1 % IJ SOLN
INTRAMUSCULAR | Status: AC
  Filled 2011-09-14: qty 60

## 2011-09-14 MED ORDER — SODIUM CHLORIDE 0.9 % IR SOLN: 80.0000 mg | Status: DC

## 2011-09-14 MED ORDER — AMLODIPINE BESYLATE 5 MG PO TABS
5.0000 mg | ORAL_TABLET | Freq: Every day | ORAL | Status: DC
  Filled 2011-09-14: qty 1

## 2011-09-14 MED ORDER — CHLORHEXIDINE GLUCONATE 4 % EX LIQD: 60.0000 mL | Freq: Once | CUTANEOUS | Status: DC

## 2011-09-14 MED ORDER — SODIUM CHLORIDE 0.9 % IV SOLN: INTRAVENOUS | Status: DC

## 2011-09-14 MED ORDER — CITALOPRAM HYDROBROMIDE 20 MG PO TABS
20.0000 mg | ORAL_TABLET | Freq: Every day | ORAL | Status: DC
  Administered 2011-09-14 – 2011-09-15 (×2): 20 mg via ORAL
  Filled 2011-09-14 (×2): qty 1

## 2011-09-14 MED ORDER — SODIUM CHLORIDE 0.9 % IV SOLN
INTRAVENOUS | Status: DC
  Administered 2011-09-14: 11:00:00 via INTRAVENOUS

## 2011-09-14 MED ORDER — PANTOPRAZOLE SODIUM 40 MG PO TBEC: 40.0000 mg | DELAYED_RELEASE_TABLET | Freq: Every day | ORAL | Status: DC

## 2011-09-14 MED ORDER — FENTANYL CITRATE 0.05 MG/ML IJ SOLN
INTRAMUSCULAR | Status: AC
  Filled 2011-09-14: qty 2

## 2011-09-14 MED ORDER — CHLORHEXIDINE GLUCONATE 4 % EX LIQD: 60.0000 mL | Freq: Once | CUTANEOUS | Status: AC

## 2011-09-14 MED ORDER — LISINOPRIL 10 MG PO TABS
10.0000 mg | ORAL_TABLET | Freq: Every day | ORAL | Status: DC
  Administered 2011-09-14 – 2011-09-15 (×2): 10 mg via ORAL
  Filled 2011-09-14 (×2): qty 1

## 2011-09-14 MED ORDER — AMOXICILLIN-POT CLAVULANATE 875-125 MG PO TABS: 1.0000 | ORAL_TABLET | Freq: Two times a day (BID) | ORAL | Status: DC

## 2011-09-14 MED ORDER — SIMVASTATIN 40 MG PO TABS: 40.0000 mg | ORAL_TABLET | Freq: Every day | ORAL | Status: DC

## 2011-09-14 MED ORDER — CEFAZOLIN SODIUM-DEXTROSE 2-3 GM-% IV SOLR
2.0000 g | INTRAVENOUS | Status: DC
  Filled 2011-09-14: qty 50

## 2011-09-14 MED ORDER — MIDAZOLAM HCL 5 MG/5ML IJ SOLN
INTRAMUSCULAR | Status: AC
  Filled 2011-09-14: qty 5

## 2011-09-14 MED ORDER — DEXTROSE 5 % IV SOLN: 3.0000 g | INTRAVENOUS | Status: DC

## 2011-09-14 MED ORDER — ASPIRIN 81 MG PO CHEW
CHEWABLE_TABLET | ORAL | Status: AC
  Filled 2011-09-14: qty 1

## 2011-09-14 MED ORDER — ONDANSETRON HCL 4 MG/2ML IJ SOLN: 4.0000 mg | Freq: Four times a day (QID) | INTRAMUSCULAR | Status: DC | PRN

## 2011-09-14 MED ORDER — SODIUM CHLORIDE 0.9 % IR SOLN
80.0000 mg | Status: DC
  Filled 2011-09-14: qty 2

## 2011-09-14 MED ORDER — ASPIRIN EC 325 MG PO TBEC: 325.0000 mg | DELAYED_RELEASE_TABLET | Freq: Every day | ORAL | Status: DC

## 2011-09-14 MED ORDER — ACETAMINOPHEN 325 MG PO TABS: 325.0000 mg | ORAL_TABLET | ORAL | Status: DC | PRN

## 2011-09-14 MED ORDER — SODIUM CHLORIDE 0.45 % IV SOLN: INTRAVENOUS | Status: DC

## 2011-09-14 MED ORDER — CHLORHEXIDINE GLUCONATE 4 % EX LIQD
60.0000 mL | Freq: Once | CUTANEOUS | Status: AC
  Administered 2011-09-14: 4 via TOPICAL

## 2011-09-14 MED ORDER — CHLORHEXIDINE GLUCONATE 4 % EX LIQD
Freq: Once | CUTANEOUS | Status: AC
  Filled 2011-09-14: qty 60

## 2011-09-14 MED ORDER — SODIUM CHLORIDE 0.45 % IV SOLN
INTRAVENOUS | Status: DC
  Administered 2011-09-14: 11:00:00 via INTRAVENOUS

## 2011-09-14 MED ORDER — ASPIRIN 81 MG PO TABS: 81.0000 mg | ORAL_TABLET | Freq: Every day | ORAL | Status: DC

## 2011-09-14 MED ORDER — CEFAZOLIN SODIUM 1-5 GM-% IV SOLN
1.0000 g | Freq: Four times a day (QID) | INTRAVENOUS | Status: AC
  Administered 2011-09-14 – 2011-09-15 (×3): 1 g via INTRAVENOUS
  Filled 2011-09-14 (×4): qty 50

## 2011-09-14 NOTE — Progress Notes (Signed)
UR Completed. Simmons, Rashaan Wyles F [phone]  

## 2011-09-14 NOTE — CV Procedure (Signed)
Preop DX:: high grade heart block  Post op DX:: same  Procedure  dual pacemaker implantation  After routine prep and drape, lidocaine was infiltrated in the prepectoral subclavicular region on the left side an incision was made and carried down to later the prepectoral fascia using electrocautery and sharp dissection a pocket was formed similarly. Hemostasis was obtained.  After this, we turned our attention to gaining accessm to the extrathoracic,left subclavian vein. This was accomplished without difficulty and without the aspiration of air or puncture of the artery. 2 separate venipunctures were accomplished; guidewires were placed and retained and sequentially 7 French sheath through which were  passed an [facility] 2088 ventricular lead serial number [id number]  and an [facility] 2088 atrial lead serial number [id number] .  The ventricular lead was manipulated to the right ventricular apex with a bipolar R wave was 8.3, the pacing impedance was1337, the threshold was 1 @ 0.5 msec  Current at threshold was   1.2 and the current of injury was brisk.  The right atrial lead was manipulated to the right atrial appendage with a bipolar P-wave  3.5, the pacing impedance was 477, the threshold0.8@ 0.5 msec   Current at threshold was 1.8  and the current of injury was brisk.  The ventricular lead was marked with a tie prior to the insertion of the atrial lead. The leads were affixed to the prepectoral fascia and attached to a  [employer] pulse generator serial number [id number]  P-synchronous/ AV  Pacing was observed.  Hemostasis was obtained. The pocket was copiously irrigated with antibiotic containing saline solution. The leads and the pulse generator were placed in the pocket and affixed to the prepectoral fascia. The wound is then closed in 3 layers in normal fashion. The wound is washed dried and a benzoin Steri-Strip this was applied the account sponge counts and instrument counts were correct at the  end of the procedure .Marland Kitchen The patient tolerated the procedure without apparent complication.  Maryagnes Amos.D.

## 2011-09-14 NOTE — Progress Notes (Addendum)
Electrophysiogy Consult  Note Patient Name: Wayne Torres Date of Encounter: 09/14/2011, 7:38 AM     Subjective  No overnight events. Patient feels well this morning w/o c/o sob or cp.   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 His last cath in October 2009revealed 3 patent grafts (mostly ectatic with 20-30% stenosis) and one occluded graft to PDA. There were left to right collaterals. No intervention was done. EF was normal;  ECG has shown persistent TW inversion laterally with QT prolongation This was complicated by right iliac artery dissection.  Denies significant CP or DOE    Objective   Telemetry: High grade heart block,  30-50s   Medications: . amLODipine  5 mg Oral Daily  . amoxicillin-clavulanate  1 tablet Oral BID  . antiseptic oral rinse  15 mL Mouth Rinse BID  . aspirin EC  325 mg Oral Daily  . chlorhexidine   Topical Once  . citalopram  20 mg Oral Daily  . fluticasone  2 puff Inhalation BID  . heparin  5,000 Units Subcutaneous Q8H  . lisinopril  10 mg Oral Daily  . omega-3 acid ethyl esters  1 g Oral BID  . pantoprazole  80 mg Oral Q1200  . simvastatin  40 mg Oral q1800  . sodium chloride  3 mL Intravenous Q12H  . sodium chloride  3 mL Intravenous Q12H  Review of Systems  HENT: Negative.  Eyes: Negative.  Respiratory: Positive for shortness of breath.  Cardiovascular: Positive for PND.  Gastrointestinal: Negative.  Genitourinary: Negative.  Musculoskeletal: Positive for joint pain.  Skin: Negative.  Neurological: Positive for dizziness and weakness.  Rare  Endo/Heme/Allergies: Negative.  Psychiatric/Behavioral: Negative.   sochx and Fam hix as in hpi     Physical Exam: Temp:  [97.8 F  (36.6 C)-98.1 F (36.7 C)] 97.8 F (36.6 C) (04/22 0500) Pulse Rate:  [35-63] 35  (04/22 0700) Resp:  [16-20] 20  (04/22 0500) BP: (89-150)/(39-85) 89/39 mmHg (04/22 0700) SpO2:  [93 %-97 %] 96 % (04/22 0700) Weight:  [221 lb 9 oz (100.5 kg)] 221 lb 9 oz (100.5 kg) (04/22 0300)  General: Pleasant white male, in no acute distress. Head: Normocephalic, atraumatic, sclera non-icteric, nares are without discharge.  Neck: Supple. Negative for carotid bruits or JVD. Lungs: Clear bilaterally to auscultation without wheezes, rales, or rhonchi. Breathing is unlabored. Heart: Bradycardic, regular rhythm S1 S2 without murmurs, rubs, or gallops.  Abdomen: Soft, non-tender, non-distended with normoactive bowel sounds. No rebound/guarding. No obvious abdominal masses. Msk:  Strength and tone appear normal for age. Extremities: No edema. No clubbing or cyanosis. Distal pedal pulses are intact and equal bilaterally. Neuro: Alert and oriented X 3. Moves all extremities spontaneously. Psych:  Responds to questions appropriately with a normal affect.   Tele high grade heart block with consectuitvely non conducted pwave but also behaviour consistent with MBZ1   Intake/Output Summary (Last 24 hours) at 09/14/11 0738 Last data filed at 09/14/11 0300  Gross per 24 hour  Intake    883 ml  Output    700 ml  Net    183 ml    Labs:  Basename 09/12/11 0920  NA 139  K 4.3  CL 105  CO2 26  GLUCOSE 104*  BUN 9  CREATININE 0.97  CALCIUM 9.2  MG 1.9   Basename 09/12/11 0920  AST 12  ALT 8  ALKPHOS 77  BILITOT 0.6  PROT 7.0  ALBUMIN 3.7   Basename 09/12/11 0920  WBC 8.2  HGB 14.4  HCT 41.7  MCV 89.1  PLT 242     09/12/2011 09:20  Prothrombin Time 14.5  INR 1.11   Basename 09/12/11 0920  TSH 1.464   Radiology/Studies:   09/12/2011 - CXR Findings: AP portable upright view 0805 hours.  Chronic cardiomegaly.  Stable mediastinal contours.  Sequelae of CABG. Stable increased interstitial  markings.  No pneumothorax, pleural effusion, consolidation or definite edema.  IMPRESSION: Chronic cardiomegaly and interstitial lung changes. No acute cardiopulmonary abnormality.     Assessment and Plan  71 y.o. male w/ PMHx significant for CAD s/p CABG, HTN, HLD, and bradycardia who presented to [facility] on 09/12[facility]11 with complaints of sob and fatigue and found to be in high grade heart block.  1. Symptomatic Bradycardia: Presented with c/o sob and fatigue with EKG showing high grade heart block. Rates initially in the 52s. Rates have been in the 30-50s over the last 24hrs. Patient hemodynamically stable without syncope or c/o cp or sob. Plans for PPM today, but will need to know LV systolic function, echo pending. Will stop amlodipine and subq heparin. 2. High grade heart block: As above. 3. CAD: s/p remote 4V CABG 32yr ago. Cath 2009 revealed 3 patent grafts (mostly ectatic with 20-30% stenosis) and one occluded graft to PDA with left to right collaterals, NL LV function, EF >55%. No anginal symptoms or EKG changes from prior.  Cont ASA, statin.   Signed, HOPE, JESSICA PA-C  Have reveiwed above with patient  We await his EF and will then determine as to whether ICD or PM is appropriate  The benefits and risks were reviewed including but not limited to death,  perforation, infection, lead dislodgement and device malfunction.  The patient understands agrees and is willing to proceed.

## 2011-09-15 ENCOUNTER — Encounter (HOSPITAL_COMMUNITY): Payer: Self-pay | Admitting: Dietician

## 2011-09-15 ENCOUNTER — Inpatient Hospital Stay (HOSPITAL_COMMUNITY): Payer: Medicare Other

## 2011-09-15 NOTE — Progress Notes (Signed)
Patient remains on oxygen via 2 liter nasal cannula and has had multiple episodes of oxygen saturations decreasing 88-89% nonsustaining while asleep during the night. Immediately after each episode of desaturation, oxygen saturations increased back into the 90's.

## 2011-09-15 NOTE — Progress Notes (Signed)
Pt quit 10 years ago and has remained tobacco free.

## 2011-09-15 NOTE — Discharge Instructions (Signed)
   Supplemental Discharge Instructions for  Pacemaker/Defibrillator Patients  Activity No heavy lifting or vigorous activity with your left/right arm for 6 to 8 weeks.  Do not raise your left/right arm above your head for one week.  Gradually raise your affected arm as drawn below.                       4/25                   4/26                      4/27                        4/28   NO DRIVING for  1 week    ; you may begin driving on     1/91     . WOUND CARE   Keep the wound area clean and dry.  Do not get this area wet for one week. No showers for one week; you may shower on      4/30        .   The tape/steri-strips on your wound will fall off; do not pull them off.  No bandage is needed on the site.  DO  NOT apply any creams, oils, or ointments to the wound area.   If you notice any drainage or discharge from the wound, any swelling or bruising at the site, or you develop a fever > 101? F after you are discharged home, call the office at once.  Special Instructions   You are still able to use cellular telephones; use the ear opposite the side where you have your pacemaker/defibrillator.  Avoid carrying your cellular phone near your device.   When traveling through airports, show security personnel your identification card to avoid being screened in the metal detectors.  Ask the security personnel to use the hand wand.   Avoid arc welding equipment, MRI testing (magnetic resonance imaging), TENS units (transcutaneous nerve stimulators).  Call the office for questions about other devices.   Avoid electrical appliances that are in poor condition or are not properly grounded.   Microwave ovens are safe to be near or to operate.

## 2011-09-15 NOTE — Progress Notes (Signed)
   ELECTROPHYSIOLOGY ROUNDING NOTE    Patient Name: Wayne Torres Date of Encounter: 09-15-2011    Hurley feels well.  No chest pain or shortness of breath.  Minimal incisional soreness.  Status post dual chamber PPM yesterday.   TELEMETRY: Reviewed telemetry pt in AV pacing Filed Vitals:   09/15/11 0235 09/15/11 0400 09/15/11 0415 09/15/11 0500  BP:  110/73 131/80 134/78  Pulse: 58 61 72 61  Temp:   97.8 F (36.6 C)   TempSrc:   Oral   Resp:   18   Height:      Weight:   222 lb 0.1 oz (100.7 kg) 222 lb 0.1 oz (100.7 kg)  SpO2: 88% 94% 95% 96%    Well developed and nourished in no acute distress HENT normal Neck supple with JVP-flat Clear Regular rate and rhythm, no murmurs or gallops Abd-soft with active BS No Clubbing cyanosis edema Skin-warm and dry A & Oriented  Grossly normal sensory and motor function   Intake/Output Summary (Last 24 hours) at 09/15/11 0801 Last data filed at 09/15/11 4270  Gross per 24 hour  Intake   1070 ml  Output   1675 ml  Net   -605 ml    LABS: Basic Metabolic Panel:  Basename 09/12/11 0920  NA 139  K 4.3  CL 105  CO2 26  GLUCOSE 104*  BUN 9  CREATININE 0.97  CALCIUM 9.2  MG 1.9  PHOS --   Liver Function Tests:  Basename 09/12/11 0920  AST 12  ALT 8  ALKPHOS 77  BILITOT 0.6  PROT 7.0  ALBUMIN 3.7   CBC:  Basename 09/12/11 0920  WBC 8.2  NEUTROABS --  HGB 14.4  HCT 41.7  MCV 89.1  PLT 242   Cardiac Enzymes:  Basename 09/14/11 0949  CKTOTAL 111  CKMB 2.2  CKMBINDEX --  TROPONINI <0.30   Thyroid Function Tests:  Basename 09/12/11 0920  TSH 1.464  T4TOTAL --  T3FREE --  THYROIDAB --   Radiology/Studies:  Final result pending, leads in stable position.  PHYSICAL EXAM Left chest without hematoma or ecchymosis.  DEVICE INTERROGATION: Device interrogated by industry.  Lead values including impedence, sensing, threshold within normal values.  Patient is PACER DEPENDENT  Principal  Problem:  *Complete heart block   Wound care, arm mobility, restrictions reviewed with patient.   Discharge plans home later today begin coreg

## 2011-09-15 NOTE — Discharge Summary (Signed)
ELECTROPHYSIOLOGY PROCEDURE DISCHARGE SUMMARY    Patient ID: Wayne Torres,  MRN: [id number], DOB/AGE: Mar 05, 1941 71 y.o.  Admit date: 09/12/2011 Discharge date: 09/15/2011  Primary Care Physician: Wayne Cower, MD Primary Cardiologist: Wayne Carnes, MD Electrophysiologist: Wayne Axe, MD  Primary Discharge Diagnosis:  Complete heart block status post pacemaker implantation this admission.   Secondary Discharge Diagnosis:  1.  GI bleed 2.  CAD- s/p CABG about 20 years ago.  Last cath in October 2009 revealed 3 patent grafts and one occluded graft to the PDA 3.  Malignant neoplasm of prostate 4.  COPD 5.  Hypertension 6.  Depression and anxiety 7.  Hyperlipidemia  Procedures This Admission: 1.  Insertion of a permanent pacemaker on 09-14-2011 by Dr Caryl Comes.  The patient received a STJ model number 2210 pacemaker with model number 2088 right atrial lead and model number 2088 right ventricular lead.  There were no early apparent complications. 2.  CXR on 09-15-2011 demonstrated no pneumothorax status post device implantation.  3.  Echo on 09-13-2011 demonstrated an EF of 45-50% with a LA of 48 and a moderately dilated RV  Brief HPI: [name] is a 71 year old male patient of Dr Harrington Challenger' who has been followed in the past for sinus bradycardia and marked first degree AV block.  This was felt to not be symptomatic.  On the day of admission, he was at rest and had a sudden onset of shortness of breath which was relieved by NTG.  He had another episode soon afterward and went to see his PCP.  There, he was found to be in complete heart block and was referred to [facility] for further evaluation.   Hospital Course:  The patient was admitted and monitored on telemetry. An echo was obtained with results as above.  He was consulted on by EP and felt to be appropriate for pacemaker placement.  Risks, benefits, and alternatives were discussed with the patient who wished to proceed.  This was carried out by  Dr Caryl Comes with details as outlined above.  He was monitored on telemetry which demonstrated AV pacing.  He was found to be pacemaker dependent after device implantation.  CXR was obtained which demonstrated no pneumothorax.  Of note, patient's O2 sat was 88% when ambulating this admission.  Pt has follow up with Dr Harrington Challenger and Dr Jenny Reichmann scheduled. Dr Caryl Comes examined the patient and considered him stable for discharge to home   Discharge Vitals: Blood pressure 128/72, pulse 70, temperature 97.9 F (36.6 C), temperature source Oral, resp. rate 14, height 6' (1.829 m), weight 222 lb 0.1 oz (100.7 kg), SpO2 96.00%.    Labs:   Lab Results  Component Value Date   WBC 8.2 09/12/2011   HGB 14.4 09/12/2011   HCT 41.7 09/12/2011   MCV 89.1 09/12/2011   PLT 242 09/12/2011    Lab 09/12/11 0920  NA 139  K 4.3  CL 105  CO2 26  BUN 9  CREATININE 0.97  CALCIUM 9.2  PROT 7.0  BILITOT 0.6  ALKPHOS 77  ALT 8  AST 12  GLUCOSE 104*   Lab Results  Component Value Date   CKTOTAL 111 09/14/2011   CKMB 2.2 09/14/2011   TROPONINI <0.30 09/14/2011    Discharge Medications:  Medication List  As of 09/15/2011  2:02 PM   STOP taking these medications         aspirin 81 MG tablet         TAKE these  medications         ALPRAZolam 0.25 MG tablet   Commonly known as: XANAX   Take 0.25 mg by mouth daily as needed. anxiety      amLODipine 5 MG tablet   Commonly known as: NORVASC   Take 1 tablet (5 mg total) by mouth daily.      amoxicillin-clavulanate 875-125 MG per tablet   Commonly known as: AUGMENTIN   Take 1 tablet by mouth 2 (two) times daily. Started 09/11/11 for 10 days      aspirin EC 325 MG tablet   Take 325 mg by mouth daily.      citalopram 20 MG tablet   Commonly known as: CELEXA   Take 1 tablet (20 mg total) by mouth daily.      Fish Oil 1200 MG Caps   Take 1 capsule by mouth daily.      fluticasone 50 MCG/ACT nasal spray   Commonly known as: FLONASE   Place 2 sprays into the nose  daily.      lisinopril 10 MG tablet   Commonly known as: PRINIVIL,ZESTRIL   Take 1 tablet (10 mg total) by mouth daily.      omeprazole 40 MG capsule   Commonly known as: PRILOSEC   Take 1 capsule (40 mg total) by mouth daily.      simvastatin 40 MG tablet   Commonly known as: ZOCOR   Take 1 tablet (40 mg total) by mouth at bedtime.         ASK your doctor about these medications         fish oil-omega-3 fatty acids 1000 MG capsule   Take 1 capsule by mouth daily.            Disposition:  Discharge Orders    Future Appointments: Provider: Department: Dept Phone: Center:   09/24/2011 11:30 AM [facility] [place] [phone] [facility]   10/23/2011 2:45 PM [name], MD [place] [phone] [facility]   01/05/2012 [place], [place] [phone] [facility]     Follow-up Information    Follow up with [place] on 09/24/2011. (At 11:30 AM)    Contact information:   [place] [phone]       Follow up with Wayne Axe, MD on 01/05/2012. (At 9:15 AM)    Contact information:   [address], [place] [phone]       Follow up with Wayne Carnes, MD on 10/23/2011. (At 2:45 PM)    Contact information:   [address] [place] [phone]          Duration of Discharge Encounter: Greater than 30 minutes including physician time.  Signed, Chanetta Marshall, RN, BSN 09/15/2011, 2:02 PM

## 2011-09-16 ENCOUNTER — Telehealth: Payer: Self-pay | Admitting: Internal Medicine

## 2011-09-16 NOTE — Telephone Encounter (Signed)
New msg Pt's wife called and said he got out of hospital yesterday. She wants to talk to someone about meds he was to get for breathing machine? Please call

## 2011-09-16 NOTE — Telephone Encounter (Signed)
Per Dr. Caryl Comes, if the patient was not on inhalers prior to hospitalization he does not need them now.

## 2011-09-16 NOTE — Telephone Encounter (Signed)
I spoke with the patient's wife. She states that the patient was sent home with equipment for inhalers to be used. They were told to pick up the meds at the nurses station yesterday before discharge and they forgot these. They called back to the nurses station and this had been thrown back. They are needing prescriptions for these medications if the patient is to continue. I explained to him that I do not see any inhalers listed on the medication list, so he may not need these outpatient. I will review with Dr. Caryl Comes and call her back. She is agreeable.

## 2011-09-24 ENCOUNTER — Ambulatory Visit (INDEPENDENT_AMBULATORY_CARE_PROVIDER_SITE_OTHER): Payer: Medicare Other | Admitting: *Deleted

## 2011-09-24 ENCOUNTER — Encounter: Payer: Self-pay | Admitting: Internal Medicine

## 2011-09-24 DIAGNOSIS — I442 Atrioventricular block, complete: Secondary | ICD-10-CM

## 2011-09-24 NOTE — Progress Notes (Signed)
Wound check pacer in clinic

## 2011-09-25 LAB — PACEMAKER DEVICE OBSERVATION
AL IMPEDENCE PM: 475 Ohm
AL THRESHOLD: 0.75 V
ATRIAL PACING PM: 35
BATTERY VOLTAGE: 2.9629 V
RV LEAD IMPEDENCE PM: 637.5 Ohm

## 2011-10-21 ENCOUNTER — Encounter: Payer: Self-pay | Admitting: Family Medicine

## 2011-10-21 ENCOUNTER — Ambulatory Visit (INDEPENDENT_AMBULATORY_CARE_PROVIDER_SITE_OTHER): Payer: Medicare Other | Admitting: Family Medicine

## 2011-10-21 VITALS — BP 108/71 | HR 62 | Temp 97.0°F | Ht 72.0 in | Wt 229.0 lb

## 2011-10-21 DIAGNOSIS — J309 Allergic rhinitis, unspecified: Secondary | ICD-10-CM

## 2011-10-21 MED ORDER — CEFDINIR 300 MG PO CAPS: ORAL_CAPSULE | ORAL | Status: DC

## 2011-10-21 MED ORDER — METHYLPREDNISOLONE ACETATE 40 MG/ML IJ SUSP
40.0000 mg | Freq: Once | INTRAMUSCULAR | Status: AC
  Administered 2011-10-21: 40 mg via INTRAMUSCULAR

## 2011-10-21 NOTE — Assessment & Plan Note (Signed)
With bilat SOM, L>R, with possible infectious component.  Depo-medrol 86m IM x 1 in office today.  Omnicef 3025mbid x 14d. He'll bring up the issue of daily allergy med such as nasal streroid or nonsedating antihistamine with his PMD, Dr. JoJenny Reichmannalthough pt says these have not been helpful in the past. Try saline nasal rinse or spray 2-3 times per day (over the counter). Try mucinex DM or something similar (robitussin DM)--over the counter-- as needed for cold symptoms.

## 2011-10-21 NOTE — Patient Instructions (Signed)
Try saline nasal rinse or spray 2-3 times per day (over the counter). Try mucinex DM or something similar (robitussin DM)--over the counter-- as needed for cold symptoms.

## 2011-10-21 NOTE — Progress Notes (Signed)
OFFICE NOTE  10/21/2011  CC:  Chief Complaint  Patient presents with  . Cough    x 2 weeks, productive, feels like head is in a drum     HPI: Patient is a 71 y.o. Caucasian male who is a patient of Dr. Cathlean Cower with [facility] in [place] who is here for URI sx's.  B/c of confusion with scheduling, I am seeing him today once for an acute illness.  He made it clear that he'll continue primary care with Dr. Jenny Reichmann. Pt presents complaining of respiratory symptoms for 2+ wks.  Primary symptoms are: head congestion, nasal congestion/PND, some occasional cough of white phlegm.  No chest tightness or fever or SOB.  "Head feels like I'm talking in a drum".  Lately the symptoms seem to be worsening. Pertinent negatives: No fevers, no wheezing, and no SOB.  No pain in face or teeth.  No significant HA.  ST mild at most.   Symptoms made worse by nothing.  Symptoms improved by nothing.  A course of augmentin about a month ago helped the sx's improve but not completely resolved. Smoker? Not now (quit 10 yrs ago) Recent sick contact? None known Muscle or joint aches? none  Additional ROS: no n/v/d or abdominal pain.  No rash.  No neck stiffness.   No fatigue or appetite loss.  Pertinent PMH:  Past Medical History  Diagnosis Date  . GI bleed   . Nausea and vomiting   . Hematemesis   . Abrasion of knee, left   . Shoulder joint pain   . Chest pain     left  . Back pain   . Hip pain, right   . PSA (psoriatic arthritis)     increased  . Lumbar disc disease   . Malignant neoplasm of prostate   . Fatigue   . COPD (chronic obstructive pulmonary disease)   . Renal calculus   . Osteoarthritis   . HTN (hypertension)   . Depression   . Anxiety   . ED (erectile dysfunction)   . Hyperlipidemia   . GERD (gastroesophageal reflux disease)   . Allergic rhinitis   . Peptic stricture of esophagus   . CAD (coronary artery disease)   . Arrhythmia     1st degree AV block, Wenckebach,nocturnal  pause of 2.9 seconds  Complete heart block: pacer inserted 08/2011  Past Surgical History  Procedure Date  . Coronary angioplasty 06/03/2007, 01/25/2009  . Nasal septum surgery april 2012    dr britt, [facility] [place]  . 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  Married.  MEDS:  Outpatient Prescriptions Prior to Visit  Medication Sig Dispense Refill  . ALPRAZolam (XANAX) 0.25 MG tablet Take 0.25 mg by mouth daily as needed. anxiety      . amLODipine (NORVASC) 5 MG tablet Take 1 tablet (5 mg total) by mouth daily.  90 tablet  3  . aspirin EC 325 MG tablet Take 325 mg by mouth daily.      . citalopram (CELEXA) 20 MG tablet Take 1 tablet (20 mg total) by mouth daily.  90 tablet  3  . lisinopril (PRINIVIL,ZESTRIL) 10 MG tablet Take 1 tablet (10 mg total) by mouth daily.  90 tablet  3  . Omega-3 Fatty Acids (FISH OIL) 1200 MG CAPS Take  1 capsule by mouth daily.      Marland Kitchen omeprazole (PRILOSEC) 40 MG capsule Take 1 capsule (40 mg total) by mouth daily.  90 capsule  3  . simvastatin (ZOCOR) 40 MG tablet Take 1 tablet (40 mg total) by mouth at bedtime.  90 tablet  3  . amoxicillin-clavulanate (AUGMENTIN) 875-125 MG per tablet Take 1 tablet by mouth 2 (two) times daily. Started 09/11/11 for 10 days        PE: Blood pressure 108/71, pulse 62, temperature 97 F (36.1 C), temperature source Temporal, height 6' (1.829 m), weight 229 lb (103.874 kg), SpO2 92.00%. VS: noted--normal. Gen: alert, NAD, NONTOXIC APPEARING. HEENT: eyes without injection, drainage, or swelling.  Ears: EACs clear, TMs with normal light reflex and landmarks.  Nose: Clear rhinorrhea, with some dried, crusty exudate adherent to mildly injected mucosa.  No purulent d/c.  No paranasal sinus TTP.  No facial swelling.  Throat and mouth without focal lesion.  No pharyngial swelling, erythema, or exudate.   Neck:  supple, no LAD.   LUNGS: CTA bilat, nonlabored resps.   CV: RRR, no m/r/g. EXT: no c/c/e SKIN: no rash  IMPRESSION AND PLAN:  Allergic rhinosinusitis With bilat SOM, L>R, with possible infectious component.  Depo-medrol 23m IM x 1 in office today.  Omnicef 3033mbid x 14d. He'll bring up the issue of daily allergy med such as nasal streroid or nonsedating antihistamine with his PMD, Dr. JoJenny Reichmannalthough pt says these have not been helpful in the past. Try saline nasal rinse or spray 2-3 times per day (over the counter). Try mucinex DM or something similar (robitussin DM)--over the counter-- as needed for cold symptoms.       FOLLOW UP: prn

## 2011-10-23 ENCOUNTER — Encounter: Payer: Self-pay | Admitting: Internal Medicine

## 2011-10-23 ENCOUNTER — Ambulatory Visit (INDEPENDENT_AMBULATORY_CARE_PROVIDER_SITE_OTHER): Payer: Medicare Other | Admitting: Internal Medicine

## 2011-10-23 VITALS — BP 101/70 | HR 60 | Ht 72.0 in | Wt 227.0 lb

## 2011-10-23 DIAGNOSIS — I2510 Atherosclerotic heart disease of native coronary artery without angina pectoris: Secondary | ICD-10-CM

## 2011-10-23 NOTE — Progress Notes (Signed)
HPI Wayne Torres is a 71 year old gentleman. He has a history of coronary artery disease. I last saw the patient back in May of last year. Last cardiac catheterization (11/2008) showed 100% left main RCA was diffusely diseased 50-60% proximal 50% mid. Vessel is occluded after the PDA, SVG RCA was occluded ostially, SVG to ramus was ectatic with 30% blockage in the midsection. SVG to diagonal was ectatic 40% lesion. Diagonal was patent. Upper branch had a 99% stenosis, but was a small vessel. There were left to right collaterals. Left subclavian tortuous. No flow-limiting plaque, LIMA to LAD was patent. LAD was small. Abdominal aortogram showed patent renal arteries. 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  I saw the patient in March In April he developed sudden SOB.  He was found to be in CHB and was referred for PPM Since he has had this placed he has felt better.  Energy is better. Denies SOB  No dizziness.  NO CP.  No Known Allergies  Current Outpatient Prescriptions  Medication Sig Dispense Refill  . ALPRAZolam (XANAX) 0.25 MG tablet Take 0.25 mg by mouth daily as needed. anxiety      . amLODipine (NORVASC) 5 MG tablet Take 1 tablet (5 mg total) by mouth daily.  90 tablet  3  . aspirin EC 325 MG tablet Take 325 mg by mouth daily.      . cefdinir (OMNICEF) 300 MG capsule 1 cap po bid x 14d  28 capsule  0  . citalopram (CELEXA) 20 MG tablet Take 1 tablet (20 mg total) by mouth daily.  90 tablet  3  . lisinopril (PRINIVIL,ZESTRIL) 10 MG tablet Take 1 tablet (10 mg total) by mouth daily.  90 tablet  3  . Omega-3 Fatty Acids (FISH OIL) 1200 MG CAPS Take 1 capsule by mouth daily.      Marland Kitchen omeprazole (PRILOSEC) 40 MG capsule Take 1 capsule (40 mg total) by mouth daily.  90 capsule  3  . simvastatin (ZOCOR) 40 MG tablet Take 1 tablet (40 mg total) by mouth at bedtime.  90 tablet  3    Past  Medical History  Diagnosis Date  . GI bleed   . Nausea and vomiting   . Hematemesis   . Abrasion of knee, left   . Shoulder joint pain   . Chest pain     left  . Back pain   . Hip pain, right   . PSA (psoriatic arthritis)     increased  . Lumbar disc disease   . Malignant neoplasm of prostate   . Fatigue   . COPD (chronic obstructive pulmonary disease)   . Renal calculus   . Osteoarthritis   . HTN (hypertension)   . Depression   . Anxiety   . ED (erectile dysfunction)   . Hyperlipidemia   . GERD (gastroesophageal reflux disease)   . Allergic rhinitis   . Peptic stricture of esophagus   . CAD (coronary artery disease)   . Arrhythmia     1st degree AV block, Wenckebach,nocturnal pause of 2.9 seconds  . Complete heart block 09/12/11    Pacer inserted by Dr. Caryl Comes    Past Surgical History  Procedure Date  . Coronary angioplasty 06/03/2007, 01/25/2009  . Nasal septum surgery april 2012    dr britt, [facility] - [place]  . Coronary artery bypass graft 20 yrs ago  LIMA to LAD, SVG to PDA, SVG to Ramus, SVG to D    Family History  Problem Relation Age of Onset  . Heart attack Father   . Heart disease Father   . Colon cancer Neg Hx   . Cancer Neg Hx     Colon    History   Social History  . Marital Status: Married    Spouse Name: N/A    Number of Children: N/A  . Years of Education: N/A   Occupational History  . Retired    Social History Main Topics  . Smoking status: Former Smoker    Quit date: 09/11/2001  . Smokeless tobacco: Never Used   Comment: Pt does not get regular exercise  . Alcohol Use: No  . Drug Use: No  . Sexually Active: Not on file   Other Topics Concern  . 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    Review of Systems:  All systems reviewed.  They are negative to the above problem except as previously stated.  Vital  Signs: BP 101/70  Pulse 60  Ht 6' (1.829 m)  Wt 227 lb (102.967 kg)  BMI 30.79 kg/m2  Physical Exam [place] is in NAD HEENT:  Normocephalic, atraumatic. EOMI, PERRLA.  Neck: JVP is normal. No thyromegaly. No bruits.  Lungs: clear to auscultation. No rales no wheezes.  Heart: Regular rate and rhythm. Normal S1, S2. No S3.   No significant murmurs. PMI not displaced. Chest:  Pacer site is clean and dry.  Abdomen:  Supple, nontender. Normal bowel sounds. No masses. No hepatomegaly.  Extremities:   Good distal pulses throughout. No lower extremity edema.  Musculoskeletal :moving all extremities.  Neuro:   alert and oriented x3.  CN II-XII grossly intact.   Assessment and Plan:  1.  Complete HB.  S/P PPM  Pacing majority of time.   Sympotms improved 2.  CAD.  Asymptomatic 3.  Dyslpidemia:  Continue statin. 4.  CHF>  Mild LV dysfunction.  I am not eager to  Switch him to a b blocker.  I think this could lead to 100% pacing and his hemodynamics may not be that good.  I would keep on same regimen.  Follow.

## 2011-12-28 ENCOUNTER — Encounter: Payer: Self-pay | Admitting: *Deleted

## 2011-12-28 DIAGNOSIS — Z950 Presence of cardiac pacemaker: Secondary | ICD-10-CM | POA: Insufficient documentation

## 2012-01-05 ENCOUNTER — Encounter: Payer: Self-pay | Admitting: Internal Medicine

## 2012-01-05 ENCOUNTER — Ambulatory Visit (INDEPENDENT_AMBULATORY_CARE_PROVIDER_SITE_OTHER): Payer: Medicare Other | Admitting: Internal Medicine

## 2012-01-05 VITALS — BP 105/64 | HR 62 | Ht 72.0 in | Wt 230.0 lb

## 2012-01-05 DIAGNOSIS — R001 Bradycardia, unspecified: Secondary | ICD-10-CM

## 2012-01-05 DIAGNOSIS — I10 Essential (primary) hypertension: Secondary | ICD-10-CM

## 2012-01-05 DIAGNOSIS — I498 Other specified cardiac arrhythmias: Secondary | ICD-10-CM

## 2012-01-05 DIAGNOSIS — Z950 Presence of cardiac pacemaker: Secondary | ICD-10-CM

## 2012-01-05 DIAGNOSIS — I2510 Atherosclerotic heart disease of native coronary artery without angina pectoris: Secondary | ICD-10-CM

## 2012-01-05 DIAGNOSIS — I442 Atrioventricular block, complete: Secondary | ICD-10-CM

## 2012-01-05 LAB — PACEMAKER DEVICE OBSERVATION
AL IMPEDENCE PM: 475 Ohm
AL THRESHOLD: 0.75 V
ATRIAL PACING PM: 38
BAMS-0001: 150 {beats}/min
BAMS-0003: 70 {beats}/min
RV LEAD AMPLITUDE: 12 mv
RV LEAD THRESHOLD: 0.5 V

## 2012-01-05 NOTE — Assessment & Plan Note (Signed)
stable

## 2012-01-05 NOTE — Progress Notes (Signed)
HPI  Wayne Torres is a 71 y.o. male  Seen in followup for PM inmplanted April 2013 for high grade heart block  He also has CAD with prior CABG-20 yrs ago and normal LV function and no major new disease T SVG>>RCA with L>R collaterals cath  2009  The patient denies chest pain, shortness of breath, nocturnal dyspnea, orthopnea or peripheral edema.  There have been no palpitations, lightheadedness or syncope.  Pocket well healed Past Medical History  Diagnosis Date  . GI bleed   . Nausea and vomiting   . Abrasion of knee, left   . Shoulder joint pain   . Coronary artery disease s/p CABG 1990s     cath 2009 Nl LV fn, T SVG>>RCA  . Back pain   . Hip pain, right   . PSA (psoriatic arthritis)     increased  . Lumbar disc disease   . Malignant neoplasm of prostate   . Fatigue   . COPD (chronic obstructive pulmonary disease)   . Renal calculus   . Osteoarthritis   . HTN (hypertension)   . Depression   . Anxiety   . ED (erectile dysfunction)   . Hyperlipidemia   . GERD (gastroesophageal reflux disease)   . Allergic rhinitis   . Peptic stricture of esophagus   . Pacemaker  [employer]     DOI 08/2011  . Complete heart block 09/12/11    Pacer inserted by Dr. Caryl Comes    Past Surgical History  Procedure Date  . Coronary angioplasty 06/03/2007, 01/25/2009  . Nasal septum surgery april 2012    dr britt, [facility] - [place]  . Coronary artery bypass graft 20 yrs ago    LIMA to LAD, SVG to PDA, SVG to Ramus, SVG to D    Current Outpatient Prescriptions  Medication Sig Dispense Refill  . ALPRAZolam (XANAX) 0.25 MG tablet Take 0.25 mg by mouth daily as needed. anxiety      . amLODipine (NORVASC) 5 MG tablet Take 1 tablet (5 mg total) by mouth daily.  90 tablet  3  . aspirin EC 325 MG tablet Take 325 mg by mouth daily.      . cefdinir (OMNICEF) 300 MG capsule 1 cap po bid x 14d  28 capsule  0  . citalopram (CELEXA) 20 MG tablet Take 1 tablet (20 mg total) by mouth daily.  90 tablet  3  .  lisinopril (PRINIVIL,ZESTRIL) 10 MG tablet Take 1 tablet (10 mg total) by mouth daily.  90 tablet  3  . Omega-3 Fatty Acids (FISH OIL) 1200 MG CAPS Take 1 capsule by mouth daily.      Marland Kitchen omeprazole (PRILOSEC) 40 MG capsule Take 1 capsule (40 mg total) by mouth daily.  90 capsule  3  . simvastatin (ZOCOR) 40 MG tablet Take 1 tablet (40 mg total) by mouth at bedtime.  90 tablet  3    No Known Allergies  Review of Systems negative except from HPI and PMH  Physical Exam BP 105/64  Pulse 62  Ht 6' (1.829 m)  Wt 230 lb (104.327 kg)  BMI 31.19 kg/m2 Well developed and well nourished in no acute distress HENT normal E scleral and icterus clear Neck Supple JVP flat; carotids brisk and full Clear to ausculation  Device pocket well healed; without hematoma or erythema Regular rate and rhythm, no murmurs gallops or rub Soft with active bowel sounds No clubbing cyanosis none Edema Alert and oriented, grossly normal motor and  sensory function Skin Warm and Dry    Assessment and  Plan

## 2012-01-05 NOTE — Patient Instructions (Signed)
Your physician wants you to follow-up in: April 2014 with Dr. Caryl Comes. You will receive a reminder letter in the mail two months in advance. If you don't receive a letter, please call our office to schedule the follow-up appointment.  Your physician recommends that you continue on your current medications as directed. Please refer to the Current Medication list given to you today.

## 2012-01-05 NOTE — Assessment & Plan Note (Signed)
The patient's device was interrogated and the information was fully reviewed.  The device was reprogrammed to  Maximize longevity

## 2012-01-05 NOTE — Assessment & Plan Note (Signed)
Stable  Continue current meds  rec decreasing asa >>81

## 2012-01-05 NOTE — Assessment & Plan Note (Signed)
S/p pacer   stable

## 2012-02-05 ENCOUNTER — Telehealth: Payer: Self-pay | Admitting: Family Medicine

## 2012-02-05 ENCOUNTER — Ambulatory Visit (INDEPENDENT_AMBULATORY_CARE_PROVIDER_SITE_OTHER): Payer: Medicare Other | Admitting: Family Medicine

## 2012-02-05 ENCOUNTER — Encounter: Payer: Self-pay | Admitting: Family Medicine

## 2012-02-05 VITALS — BP 103/70 | HR 69 | Temp 97.8°F | Ht 72.0 in | Wt 233.0 lb

## 2012-02-05 DIAGNOSIS — J209 Acute bronchitis, unspecified: Secondary | ICD-10-CM

## 2012-02-05 DIAGNOSIS — I10 Essential (primary) hypertension: Secondary | ICD-10-CM

## 2012-02-05 MED ORDER — GUAIFENESIN ER 600 MG PO TB12: 600.0000 mg | ORAL_TABLET | Freq: Two times a day (BID) | ORAL | Status: DC

## 2012-02-05 MED ORDER — ALIGN PO CAPS: 1.0000 | ORAL_CAPSULE | Freq: Every day | ORAL | Status: DC

## 2012-02-05 MED ORDER — AZITHROMYCIN 250 MG PO TABS: ORAL_TABLET | ORAL | Status: AC

## 2012-02-05 MED ORDER — HYDROCOD POLST-CHLORPHEN POLST 10-8 MG/5ML PO LQCR: 5.0000 mL | Freq: Two times a day (BID) | ORAL | Status: DC | PRN

## 2012-02-05 NOTE — Patient Instructions (Addendum)
Bronchitis Bronchitis is the body's way of reacting to injury and/or infection (inflammation) of the bronchi. Bronchi are the air tubes that extend from the windpipe into the lungs. If the inflammation becomes severe, it may cause shortness of breath. CAUSES  Inflammation may be caused by:  A virus.   Germs (bacteria).   Dust.   Allergens.   Pollutants and many other irritants.  The cells lining the bronchial tree are covered with tiny hairs (cilia). These constantly beat upward, away from the lungs, toward the mouth. This keeps the lungs free of pollutants. When these cells become too irritated and are unable to do their job, mucus begins to develop. This causes the characteristic cough of bronchitis. The cough clears the lungs when the cilia are unable to do their job. Without either of these protective mechanisms, the mucus would settle in the lungs. Then you would develop pneumonia. Smoking is a common cause of bronchitis and can contribute to pneumonia. Stopping this habit is the single most important thing you can do to help yourself. TREATMENT   Your caregiver may prescribe an antibiotic if the cough is caused by bacteria. Also, medicines that open up your airways make it easier to breathe. Your caregiver may also recommend or prescribe an expectorant. It will loosen the mucus to be coughed up. Only take over-the-counter or prescription medicines for pain, discomfort, or fever as directed by your caregiver.   Removing whatever causes the problem (smoking, for example) is critical to preventing the problem from getting worse.   Cough suppressants may be prescribed for relief of cough symptoms.   Inhaled medicines may be prescribed to help with symptoms now and to help prevent problems from returning.   For those with recurrent (chronic) bronchitis, there may be a need for steroid medicines.  SEEK IMMEDIATE MEDICAL CARE IF:   During treatment, you develop more pus-like mucus  (purulent sputum).   You have a fever.   Your baby is older than 3 months with a rectal temperature of 102 F (38.9 C) or higher.   Your baby is 78 months old or younger with a rectal temperature of 100.4 F (38 C) or higher.   You become progressively more ill.   You have increased difficulty breathing, wheezing, or shortness of breath.  It is necessary to seek immediate medical care if you are elderly or sick from any other disease. MAKE SURE YOU:   Understand these instructions.   Will watch your condition.   Will get help right away if you are not doing well or get worse.  Document Released: 05/11/2005 Document Revised: 04/30/2011 Document Reviewed: 03/20/2008 [facility] Patient Information 2012 [place].

## 2012-02-05 NOTE — Telephone Encounter (Signed)
I spoke with AJ at [facility] and he states the Zpak interferes with the Celexa. Per Dr Charlett Blake hold the Celexa for 7 days, then restart.   AJ states he will notify the patient

## 2012-02-07 ENCOUNTER — Encounter: Payer: Self-pay | Admitting: Family Medicine

## 2012-02-07 DIAGNOSIS — J42 Unspecified chronic bronchitis: Secondary | ICD-10-CM

## 2012-02-07 DIAGNOSIS — J189 Pneumonia, unspecified organism: Secondary | ICD-10-CM | POA: Insufficient documentation

## 2012-02-07 HISTORY — DX: Unspecified chronic bronchitis: J42

## 2012-02-07 NOTE — Progress Notes (Signed)
Patient ID: Wayne Torres, male   DOB: [birth year], 71 y.o.   MRN: [id number] Wayne Torres [id number] Aug 25, 1940 02/07/2012      Progress Note-Follow Up  Subjective  Chief Complaint  Chief Complaint  Patient presents with  . Cough    w/ phlegm (milky) AND CONGESTION X 3 days    HPI  Patient is a 24 Caucasian male in today for evaluation of worsening cough. Is productive of some whitish phlegm at times. Has had some low-grade fevers, chills, malaise and myalgias. Denies any sore throat, ear pain, headache, facial pressure, chest pain, shortness of breath or GI complaints at today's visit. Has not tried any medications over-the-counter to manage his symptoms assistant ligament was at home with similar symptoms and they appear to be responding to azithromycin  Past Medical History  Diagnosis Date  . GI bleed   . Nausea and vomiting   . Abrasion of knee, left   . Shoulder joint pain   . Coronary artery disease s/p CABG 1990s     cath 2009 Nl LV fn, T SVG>>RCA  . Back pain   . Hip pain, right   . PSA (psoriatic arthritis)     increased  . Lumbar disc disease   . Malignant neoplasm of prostate   . Fatigue   . COPD (chronic obstructive pulmonary disease)   . Renal calculus   . Osteoarthritis   . HTN (hypertension)   . Depression   . Anxiety   . ED (erectile dysfunction)   . Hyperlipidemia   . GERD (gastroesophageal reflux disease)   . Allergic rhinitis   . Peptic stricture of esophagus   . Pacemaker  [employer]     DOI 08/2011  . Complete heart block 09/12/11    Pacer inserted by Dr. Caryl Comes  . Acute bronchitis 02/07/2012    Past Surgical History  Procedure Date  . Coronary angioplasty 06/03/2007, 01/25/2009  . Nasal septum surgery april 2012    dr britt, [facility] - [place]  . Coronary artery bypass graft 20 yrs ago    LIMA to LAD, SVG to PDA, SVG to Ramus, SVG to D    Family History  Problem Relation Age of Onset  . Heart attack Father   . Heart disease Father   . Colon  cancer Neg Hx   . Cancer Neg Hx     Colon    History   Social History  . Marital Status: Married    Spouse Name: N/A    Number of Children: N/A  . Years of Education: N/A   Occupational History  . Retired    Social History Main Topics  . Smoking status: Former Smoker    Quit date: 09/11/2001  . Smokeless tobacco: Never Used   Comment: Pt does not get regular exercise  . Alcohol Use: No  . Drug Use: No  . Sexually Active: Not on file   Other Topics Concern  . 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    Current Outpatient Prescriptions on File Prior to Visit  Medication Sig Dispense Refill  . ALPRAZolam (XANAX) 0.25 MG tablet Take 0.25 mg by mouth daily as needed. anxiety      . amLODipine (NORVASC) 5 MG tablet Take 1 tablet (5 mg total) by mouth daily.  90 tablet  3  . aspirin EC 325 MG tablet Take  325 mg by mouth daily.      . citalopram (CELEXA) 20 MG tablet Take 1 tablet (20 mg total) by mouth daily.  90 tablet  3  . lisinopril (PRINIVIL,ZESTRIL) 10 MG tablet Take 1 tablet (10 mg total) by mouth daily.  90 tablet  3  . Omega-3 Fatty Acids (FISH OIL) 1200 MG CAPS Take 1 capsule by mouth daily.      Marland Kitchen omeprazole (PRILOSEC) 40 MG capsule Take 1 capsule (40 mg total) by mouth daily.  90 capsule  3  . simvastatin (ZOCOR) 40 MG tablet Take 1 tablet (40 mg total) by mouth at bedtime.  90 tablet  3    No Known Allergies  Review of Systems  Review of Systems  Constitutional: Negative for fever, chills and malaise/fatigue.  HENT: Positive for congestion and sore throat.   Eyes: Negative for pain and discharge.  Respiratory: Positive for cough, sputum production and shortness of breath.   Cardiovascular: Negative for chest pain, palpitations and leg swelling.  Gastrointestinal: Negative for nausea, abdominal pain and diarrhea.  Genitourinary: Negative for  dysuria.  Musculoskeletal: Negative for falls.  Skin: Negative for rash.  Neurological: Negative for loss of consciousness, weakness and headaches.  Endo/Heme/Allergies: Negative for polydipsia.  Psychiatric/Behavioral: Negative for depression and suicidal ideas. The patient is not nervous/anxious and does not have insomnia.     Objective  BP 103/70  Pulse 69  Temp 97.8 F (36.6 C) (Temporal)  Ht 6' (1.829 m)  Wt 233 lb (105.688 kg)  BMI 31.60 kg/m2  SpO2 90%  Physical Exam  Physical Exam  Constitutional: He is oriented to person, place, and time and well-developed, well-nourished, and in no distress. No distress.  HENT:  Head: Normocephalic and atraumatic.  Eyes: Conjunctivae normal are normal.  Neck: Neck supple. No thyromegaly present.  Cardiovascular: Normal rate, regular rhythm and normal heart sounds.   No murmur heard. Pulmonary/Chest: Effort normal. No respiratory distress.       Decreased breath sounds b/l bases  Abdominal: He exhibits no distension and no mass. There is no tenderness.  Musculoskeletal: He exhibits no edema.  Neurological: He is alert and oriented to person, place, and time.  Skin: Skin is warm.  Psychiatric: Memory, affect and judgment normal.    Lab Results  Component Value Date   TSH 1.464 09/12/2011   Lab Results  Component Value Date   WBC 8.2 09/12/2011   HGB 14.4 09/12/2011   HCT 41.7 09/12/2011   MCV 89.1 09/12/2011   PLT 242 09/12/2011   Lab Results  Component Value Date   CREATININE 0.97 09/12/2011   BUN 9 09/12/2011   NA 139 09/12/2011   K 4.3 09/12/2011   CL 105 09/12/2011   CO2 26 09/12/2011   Lab Results  Component Value Date   ALT 8 09/12/2011   AST 12 09/12/2011   ALKPHOS 77 09/12/2011   BILITOT 0.6 09/12/2011   Lab Results  Component Value Date   CHOL 116 03/20/2011   Lab Results  Component Value Date   HDL 24.30* 03/20/2011   Lab Results  Component Value Date   LDLCALC 67 03/20/2011   Lab Results  Component  Value Date   TRIG 125.0 03/20/2011   Lab Results  Component Value Date   CHOLHDL 5 03/20/2011     Assessment & Plan  Acute bronchitis Started on Azithromycin, Mucinex, increase hydration and rest. Tussionex prn. Hold Citalopram for 1 week while on Azithromycin  HYPERTENSION Adequately controlled, no  changes.

## 2012-02-07 NOTE — Assessment & Plan Note (Signed)
Adequately controlled, no changes.

## 2012-02-07 NOTE — Assessment & Plan Note (Addendum)
Started on Azithromycin, Mucinex, increase hydration and rest. Tussionex prn. Hold Citalopram for 1 week while on Azithromycin

## 2012-02-08 ENCOUNTER — Ambulatory Visit
Admission: RE | Admit: 2012-02-08 | Discharge: 2012-02-08 | Disposition: A | Discharge status: Home / Self Care | Payer: Medicare Other | Source: Ambulatory Visit | Attending: Family Medicine | Admitting: Family Medicine

## 2012-02-08 ENCOUNTER — Ambulatory Visit (INDEPENDENT_AMBULATORY_CARE_PROVIDER_SITE_OTHER): Payer: Medicare Other | Admitting: Family Medicine

## 2012-02-08 ENCOUNTER — Encounter: Payer: Self-pay | Admitting: Family Medicine

## 2012-02-08 ENCOUNTER — Other Ambulatory Visit: Payer: Self-pay | Admitting: Family Medicine

## 2012-02-08 ENCOUNTER — Ambulatory Visit (INDEPENDENT_AMBULATORY_CARE_PROVIDER_SITE_OTHER)
Admission: RE | Admit: 2012-02-08 | Discharge: 2012-02-08 | Disposition: A | Discharge status: Home / Self Care | Payer: Medicare Other | Source: Ambulatory Visit | Attending: Family Medicine | Admitting: Family Medicine

## 2012-02-08 VITALS — BP 101/64 | HR 87 | Temp 98.7°F | Ht 72.0 in | Wt 229.0 lb

## 2012-02-08 DIAGNOSIS — I10 Essential (primary) hypertension: Secondary | ICD-10-CM

## 2012-02-08 DIAGNOSIS — J40 Bronchitis, not specified as acute or chronic: Secondary | ICD-10-CM

## 2012-02-08 DIAGNOSIS — J189 Pneumonia, unspecified organism: Secondary | ICD-10-CM

## 2012-02-08 MED ORDER — BUDESONIDE-FORMOTEROL FUMARATE 80-4.5 MCG/ACT IN AERO: 2.0000 | INHALATION_SPRAY | Freq: Two times a day (BID) | RESPIRATORY_TRACT | Status: DC

## 2012-02-08 MED ORDER — HYDROCODONE-HOMATROPINE 5-1.5 MG/5ML PO SYRP: 5.0000 mL | ORAL_SOLUTION | Freq: Three times a day (TID) | ORAL | Status: DC | PRN

## 2012-02-08 NOTE — Patient Instructions (Addendum)

## 2012-02-09 ENCOUNTER — Telehealth: Payer: Self-pay | Admitting: Family Medicine

## 2012-02-09 ENCOUNTER — Encounter: Payer: Self-pay | Admitting: Family Medicine

## 2012-02-09 ENCOUNTER — Ambulatory Visit (INDEPENDENT_AMBULATORY_CARE_PROVIDER_SITE_OTHER): Payer: Medicare Other | Admitting: Family Medicine

## 2012-02-09 VITALS — BP 107/63 | HR 73 | Temp 97.8°F | Ht 72.0 in | Wt 230.1 lb

## 2012-02-09 DIAGNOSIS — J449 Chronic obstructive pulmonary disease, unspecified: Secondary | ICD-10-CM

## 2012-02-09 DIAGNOSIS — I10 Essential (primary) hypertension: Secondary | ICD-10-CM

## 2012-02-09 DIAGNOSIS — J189 Pneumonia, unspecified organism: Secondary | ICD-10-CM

## 2012-02-09 LAB — RENAL FUNCTION PANEL
Albumin: 3.3 g/dL — ABNORMAL LOW (ref 3.5–5.2)
BUN: 18 mg/dL (ref 6–23)
CO2: 21 mEq/L (ref 19–32)
Calcium: 8.3 mg/dL — ABNORMAL LOW (ref 8.4–10.5)
Creatinine, Ser: 1.2 mg/dL (ref 0.4–1.5)

## 2012-02-09 LAB — CBC
HCT: 42.5 % (ref 39.0–52.0)
Hemoglobin: 13.7 g/dL (ref 13.0–17.0)
RBC: 4.46 Mil/uL (ref 4.22–5.81)
WBC: 9.4 10*3/uL (ref 4.5–10.5)

## 2012-02-09 MED ORDER — METHYLPREDNISOLONE ACETATE 40 MG/ML IJ SUSP
40.0000 mg | Freq: Once | INTRAMUSCULAR | Status: AC
  Administered 2012-02-09: 40 mg via INTRAMUSCULAR

## 2012-02-09 MED ORDER — METHYLPREDNISOLONE 4 MG PO KIT: PACK | ORAL | Status: DC

## 2012-02-09 MED ORDER — ALBUTEROL SULFATE HFA 108 (90 BASE) MCG/ACT IN AERS: 2.0000 | INHALATION_SPRAY | Freq: Four times a day (QID) | RESPIRATORY_TRACT | Status: DC | PRN

## 2012-02-09 MED ORDER — BUDESONIDE-FORMOTEROL FUMARATE 160-4.5 MCG/ACT IN AERO: 2.0000 | INHALATION_SPRAY | Freq: Two times a day (BID) | RESPIRATORY_TRACT | Status: DC

## 2012-02-09 MED ORDER — AMOXICILLIN-POT CLAVULANATE 875-125 MG PO TABS: 1.0000 | ORAL_TABLET | Freq: Two times a day (BID) | ORAL | Status: DC

## 2012-02-09 NOTE — Assessment & Plan Note (Signed)
CXR today shows b/l lower lobe opacities, given Hydromet to help qhs for cough, finish Azithromycin, Mucinex bid report worsening symptoms

## 2012-02-09 NOTE — Assessment & Plan Note (Signed)
Well controlled no changes today

## 2012-02-09 NOTE — Patient Instructions (Addendum)

## 2012-02-09 NOTE — Telephone Encounter (Signed)
Caller: [name]/Patient; Patient Name: Wayne Torres; PCP: [facility]); Best Callback Phone Number: [phone]. States that he had a CXR done in office yesterday and wants to know if results are back. States that he has been in the office almost everyday since 02/05/12. Was in office 02/08/12.  Taking Hydromet given by Dr. Randel Pigg. helping some. Is still running fever and having sweats. Coughing up greenish mucous. Was placed on Z-Pack 02/05/12. Oxygen Saturation is 91 percent on room air. Was diagnosed with Bronchitis on Friday, 02/05/12. Still taking Mucinex and Symbicort inhaler. Temp is 99. All emergent symptoms per Cough protocol ruled out. See MD within 4 hours disposition. Appointment scheduled for today at 1300 with Dr. Randel Pigg.

## 2012-02-09 NOTE — Telephone Encounter (Signed)
Patient's wife called in & said that if patient needs a referral to a pulmonary specialist she is requesting Dr Gwenette Greet.

## 2012-02-09 NOTE — Progress Notes (Signed)
Patient ID: Wayne Torres, male   DOB: 01/17/1941, 71 y.o.   MRN: [id number] Wayne Torres [id number] [birth year] 02/09/2012      Progress Note-Follow Up  Subjective  Chief Complaint  Chief Complaint  Patient presents with  . Cough    HPI  Patient is a 71 year old Caucasian male who is in today with persistent cough. He was seen yesterday and chest x-ray did show bilateral lower lobe opacities consistent with either fibrosis versus infectious feet. He has multiple family members sick at home including his wife and his grandson. He was a smoker but quit 10 years ago after one half pack per day history. Review of his chart reveals a CAT scan of his chest and 2005 did show already some fibrosis and lymphadenopathy. We will proceed today with further imaging. We gave him Hydromet yesterday and he said he did not touch the cough. Overnight he had fevers cough but denies chest pain or shortness of breath. Has persistent head congestion but no GI upset is noted  Past Medical History  Diagnosis Date  . GI bleed   . Nausea and vomiting   . Abrasion of knee, left   . Shoulder joint pain   . Coronary artery disease s/p CABG 1990s     cath 2009 Nl LV fn, T SVG>>RCA  . Back pain   . Hip pain, right   . PSA (psoriatic arthritis)     increased  . Lumbar disc disease   . Malignant neoplasm of prostate   . Fatigue   . COPD (chronic obstructive pulmonary disease)   . Renal calculus   . Osteoarthritis   . HTN (hypertension)   . Depression   . Anxiety   . ED (erectile dysfunction)   . Hyperlipidemia   . GERD (gastroesophageal reflux disease)   . Allergic rhinitis   . Peptic stricture of esophagus   . Pacemaker  [employer]     DOI 08/2011  . Complete heart block 09/12/11    Pacer inserted by Dr. Caryl Comes  . Acute bronchitis 02/07/2012    Past Surgical History  Procedure Date  . Coronary angioplasty 06/03/2007, 01/25/2009  . Nasal septum surgery april 2012    dr britt, [facility] - [place]  .  Coronary artery bypass graft 20 yrs ago    LIMA to LAD, SVG to PDA, SVG to Ramus, SVG to D    Family History  Problem Relation Age of Onset  . Heart attack Father   . Heart disease Father   . Colon cancer Neg Hx   . Cancer Neg Hx     Colon    History   Social History  . Marital Status: Married    Spouse Name: N/A    Number of Children: N/A  . Years of Education: N/A   Occupational History  . Retired    Social History Main Topics  . Smoking status: Former Smoker    Quit date: 09/11/2001  . Smokeless tobacco: Never Used   Comment: Pt does not get regular exercise  . Alcohol Use: No  . Drug Use: No  . Sexually Active: Not on file   Other Topics Concern  . 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    Current Outpatient Prescriptions on File Prior to Visit  Medication Sig Dispense Refill  . ALPRAZolam (XANAX) 0.25 MG tablet Take 0.25  mg by mouth daily as needed. anxiety      . amLODipine (NORVASC) 5 MG tablet Take 1 tablet (5 mg total) by mouth daily.  90 tablet  3  . aspirin EC 325 MG tablet Take 325 mg by mouth daily.      Marland Kitchen azithromycin (ZITHROMAX) 250 MG tablet 2 tabs po once and 1 tab po daily x 4 days  6 tablet  0  . bifidobacterium infantis (ALIGN) capsule Take 1 capsule by mouth daily.      . budesonide-formoterol (SYMBICORT) 80-4.5 MCG/ACT inhaler Inhale 2 puffs into the lungs 2 (two) times daily.  1 Inhaler  0  . chlorpheniramine-HYDROcodone (TUSSIONEX PENNKINETIC ER) 10-8 MG/5ML LQCR Take 5 mLs by mouth every 12 (twelve) hours as needed.  140 mL  1  . citalopram (CELEXA) 20 MG tablet Take 1 tablet (20 mg total) by mouth daily.  90 tablet  3  . guaiFENesin (MUCINEX) 600 MG 12 hr tablet Take 1 tablet (600 mg total) by mouth 2 (two) times daily. X 10 days  20 tablet  0  . HYDROcodone-homatropine (HYCODAN) 5-1.5 MG/5ML syrup Take 5 mLs by mouth every 8  (eight) hours as needed for cough (causes sedation ).  240 mL  1  . lisinopril (PRINIVIL,ZESTRIL) 10 MG tablet Take 1 tablet (10 mg total) by mouth daily.  90 tablet  3  . Omega-3 Fatty Acids (FISH OIL) 1200 MG CAPS Take 1 capsule by mouth daily.      Marland Kitchen omeprazole (PRILOSEC) 40 MG capsule Take 1 capsule (40 mg total) by mouth daily.  90 capsule  3  . simvastatin (ZOCOR) 40 MG tablet Take 1 tablet (40 mg total) by mouth at bedtime.  90 tablet  3  . albuterol (PROVENTIL HFA;VENTOLIN HFA) 108 (90 BASE) MCG/ACT inhaler Inhale 2 puffs into the lungs every 6 (six) hours as needed for wheezing.  1 Inhaler  1   No current facility-administered medications on file prior to visit.    No Known Allergies  Review of Systems  Review of Systems  Constitutional: Positive for malaise/fatigue. Negative for fever.  HENT: Positive for congestion.   Eyes: Negative for discharge.  Respiratory: Positive for cough and sputum production. Negative for shortness of breath and wheezing.   Cardiovascular: Negative for chest pain, palpitations and leg swelling.  Gastrointestinal: Negative for nausea, abdominal pain and diarrhea.  Genitourinary: Negative for dysuria.  Musculoskeletal: Negative for falls.  Skin: Negative for rash.  Neurological: Negative for loss of consciousness and headaches.  Endo/Heme/Allergies: Negative for polydipsia.  Psychiatric/Behavioral: Negative for depression and suicidal ideas. The patient is not nervous/anxious and does not have insomnia.     Objective  BP 107/63  Pulse 73  Temp 97.8 F (36.6 C) (Temporal)  Ht 6' (1.829 m)  Wt 230 lb 1.9 oz (104.382 kg)  BMI 31.21 kg/m2  SpO2 93%  Physical Exam  Physical Exam  Constitutional: He is oriented to person, place, and time and well-developed, well-nourished, and in no distress. No distress.  HENT:  Head: Normocephalic and atraumatic.  Right Ear: External ear normal.  Left Ear: External ear normal.  Nose: Nose normal.    Mouth/Throat: Oropharynx is clear and moist.  Eyes: Conjunctivae normal are normal.  Neck: Neck supple. No thyromegaly present.  Cardiovascular: Normal rate, regular rhythm and normal heart sounds.  Exam reveals no gallop.   No murmur heard. Pulmonary/Chest: Effort normal and breath sounds normal. No respiratory distress.  Abdominal: He exhibits no distension and  no mass. There is no tenderness.  Musculoskeletal: He exhibits no edema.  Neurological: He is alert and oriented to person, place, and time.  Skin: Skin is warm and dry. No rash noted. No erythema.  Psychiatric: Memory, affect and judgment normal.    Lab Results  Component Value Date   TSH 1.464 09/12/2011   Lab Results  Component Value Date   WBC 8.2 09/12/2011   HGB 14.4 09/12/2011   HCT 41.7 09/12/2011   MCV 89.1 09/12/2011   PLT 242 09/12/2011   Lab Results  Component Value Date   CREATININE 0.97 09/12/2011   BUN 9 09/12/2011   NA 139 09/12/2011   K 4.3 09/12/2011   CL 105 09/12/2011   CO2 26 09/12/2011   Lab Results  Component Value Date   ALT 8 09/12/2011   AST 12 09/12/2011   ALKPHOS 77 09/12/2011   BILITOT 0.6 09/12/2011   Lab Results  Component Value Date   CHOL 116 03/20/2011   Lab Results  Component Value Date   HDL 24.30* 03/20/2011   Lab Results  Component Value Date   LDLCALC 67 03/20/2011   Lab Results  Component Value Date   TRIG 125.0 03/20/2011   Lab Results  Component Value Date   CHOLHDL 5 03/20/2011     Assessment & Plan  HYPERTENSION Well controlled no changes today  Pneumonia Patient coughed all night. Chest x-ray showed bilateral opacities consistent with fibrosis versus infectious disease. Reviewed CT of chest from 2005. Did show some fibrosis, enlarged lymph nodes. Will repeat CT scan, given Depo Medrol 40 mg IM in office, started on Medrol dosepak and Augmentin XR 875 bid and given Symbicort 160/4.5 1 puff po bid sample, and given Albuterol prn to use for cough, sob, wheeze.  Renal and CBC done now

## 2012-02-09 NOTE — Telephone Encounter (Signed)
Noted.  Dr. Charlett Blake aware also.

## 2012-02-09 NOTE — Assessment & Plan Note (Addendum)
Patient coughed all night. Chest x-ray showed bilateral opacities consistent with fibrosis versus infectious disease. Reviewed CT of chest from 2005. Did show some fibrosis, enlarged lymph nodes. Will repeat CT scan, given Depo Medrol 40 mg IM in office, started on Medrol dosepak and Augmentin XR 875 bid and given Symbicort 160/4.5 1 puff po bid sample, and given Albuterol prn to use for cough, sob, wheeze. Renal and CBC done now

## 2012-02-09 NOTE — Assessment & Plan Note (Signed)
Well controlled

## 2012-02-09 NOTE — Progress Notes (Signed)
Patient ID: Wayne Torres, male   DOB: 1940/09/09, 71 y.o.   MRN: [id number] BRANNDON TUITE [id number] 05/28/1940 02/09/2012      Progress Note-Follow Up  Subjective  Chief Complaint  Chief Complaint  Patient presents with  . Pneumonia    HPI  This is a 71 year old male who is in today complaining of persistent cough. He says he slept very poorly left ventricular cough. Cough is productive of green phlegm. He took a dose of Tylenol about 3 AM and was able to get a small amount of sleep after that. Was struggling with fevers and chills before that. Started with diarrhea yesterday had 3 loose stool vomiting blood yesterday and today. Mild decrease in appetite is also noted. Denies chest pain or palpitations but does have significant episodes of coughing as well as some head congestion. Sore throat is better and he is not complaining of any significant headache at this time  Past Medical History  Diagnosis Date  . GI bleed   . Nausea and vomiting   . Abrasion of knee, left   . Shoulder joint pain   . Coronary artery disease s/p CABG 1990s     cath 2009 Nl LV fn, T SVG>>RCA  . Back pain   . Hip pain, right   . PSA (psoriatic arthritis)     increased  . Lumbar disc disease   . Malignant neoplasm of prostate   . Fatigue   . COPD (chronic obstructive pulmonary disease)   . Renal calculus   . Osteoarthritis   . HTN (hypertension)   . Depression   . Anxiety   . ED (erectile dysfunction)   . Hyperlipidemia   . GERD (gastroesophageal reflux disease)   . Allergic rhinitis   . Peptic stricture of esophagus   . Pacemaker  [employer]     DOI 08/2011  . Complete heart block 09/12/11    Pacer inserted by Dr. Caryl Comes  . Acute bronchitis 02/07/2012    Past Surgical History  Procedure Date  . Coronary angioplasty 06/03/2007, 01/25/2009  . Nasal septum surgery april 2012    dr britt, [facility] - [place]  . Coronary artery bypass graft 20 yrs ago    LIMA to LAD, SVG to PDA, SVG to Ramus, SVG to D     Family History  Problem Relation Age of Onset  . Heart attack Father   . Heart disease Father   . Colon cancer Neg Hx   . Cancer Neg Hx     Colon    History   Social History  . Marital Status: Married    Spouse Name: N/A    Number of Children: N/A  . Years of Education: N/A   Occupational History  . Retired    Social History Main Topics  . Smoking status: Former Smoker    Quit date: 09/11/2001  . Smokeless tobacco: Never Used   Comment: Pt does not get regular exercise  . Alcohol Use: No  . Drug Use: No  . Sexually Active: Not on file   Other Topics Concern  . 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    Current Outpatient Prescriptions on File Prior to Visit  Medication Sig Dispense Refill  . ALPRAZolam (XANAX) 0.25 MG tablet Take 0.25 mg by mouth daily as needed. anxiety      . amLODipine (NORVASC) 5  MG tablet Take 1 tablet (5 mg total) by mouth daily.  90 tablet  3  . aspirin EC 325 MG tablet Take 325 mg by mouth daily.      Marland Kitchen azithromycin (ZITHROMAX) 250 MG tablet 2 tabs po once and 1 tab po daily x 4 days  6 tablet  0  . bifidobacterium infantis (ALIGN) capsule Take 1 capsule by mouth daily.      Marland Kitchen guaiFENesin (MUCINEX) 600 MG 12 hr tablet Take 1 tablet (600 mg total) by mouth 2 (two) times daily. X 10 days  20 tablet  0  . lisinopril (PRINIVIL,ZESTRIL) 10 MG tablet Take 1 tablet (10 mg total) by mouth daily.  90 tablet  3  . Omega-3 Fatty Acids (FISH OIL) 1200 MG CAPS Take 1 capsule by mouth daily.      Marland Kitchen omeprazole (PRILOSEC) 40 MG capsule Take 1 capsule (40 mg total) by mouth daily.  90 capsule  3  . simvastatin (ZOCOR) 40 MG tablet Take 1 tablet (40 mg total) by mouth at bedtime.  90 tablet  3  . albuterol (PROVENTIL HFA;VENTOLIN HFA) 108 (90 BASE) MCG/ACT inhaler Inhale 2 puffs into the lungs every 6 (six) hours as needed for wheezing.  1  Inhaler  1  . budesonide-formoterol (SYMBICORT) 160-4.5 MCG/ACT inhaler Inhale 2 puffs into the lungs 2 (two) times daily.  1 Inhaler  0  . chlorpheniramine-HYDROcodone (TUSSIONEX PENNKINETIC ER) 10-8 MG/5ML LQCR Take 5 mLs by mouth every 12 (twelve) hours as needed.  140 mL  1  . citalopram (CELEXA) 20 MG tablet Take 1 tablet (20 mg total) by mouth daily.  90 tablet  3   No current facility-administered medications on file prior to visit.    No Known Allergies  Review of Systems  Review of Systems  Constitutional: Positive for fever, chills and malaise/fatigue.  HENT: Positive for congestion and sore throat.   Eyes: Negative for discharge.  Respiratory: Positive for cough, sputum production and shortness of breath.   Cardiovascular: Negative for chest pain, palpitations and leg swelling.  Gastrointestinal: Negative for nausea, abdominal pain and diarrhea.  Genitourinary: Negative for dysuria.  Musculoskeletal: Negative for falls.  Skin: Negative for rash.  Neurological: Positive for headaches. Negative for loss of consciousness.  Endo/Heme/Allergies: Negative for polydipsia.  Psychiatric/Behavioral: Negative for depression and suicidal ideas. The patient is not nervous/anxious and does not have insomnia.     Objective  BP 101/64  Pulse 87  Temp 98.7 F (37.1 C) (Temporal)  Ht 6' (1.829 m)  Wt 229 lb (103.874 kg)  BMI 31.06 kg/m2  SpO2 91%  Physical Exam  Physical Exam  Constitutional: He is oriented to person, place, and time and well-developed, well-nourished, and in no distress. No distress.  HENT:  Head: Normocephalic and atraumatic.  Eyes: Conjunctivae normal are normal.  Neck: Neck supple. No thyromegaly present.  Cardiovascular: Normal rate, regular rhythm and normal heart sounds.   No murmur heard. Pulmonary/Chest: Effort normal. No respiratory distress.       Decreased bs b/l bases, slight rhonchi RLL  Abdominal: He exhibits no distension and no mass. There  is no tenderness.  Musculoskeletal: He exhibits no edema.  Neurological: He is alert and oriented to person, place, and time.  Skin: Skin is warm.  Psychiatric: Memory, affect and judgment normal.    Lab Results  Component Value Date   TSH 1.464 09/12/2011   Lab Results  Component Value Date   WBC 8.2 09/12/2011   HGB  14.4 09/12/2011   HCT 41.7 09/12/2011   MCV 89.1 09/12/2011   PLT 242 09/12/2011   Lab Results  Component Value Date   CREATININE 0.97 09/12/2011   BUN 9 09/12/2011   NA 139 09/12/2011   K 4.3 09/12/2011   CL 105 09/12/2011   CO2 26 09/12/2011   Lab Results  Component Value Date   ALT 8 09/12/2011   AST 12 09/12/2011   ALKPHOS 77 09/12/2011   BILITOT 0.6 09/12/2011   Lab Results  Component Value Date   CHOL 116 03/20/2011   Lab Results  Component Value Date   HDL 24.30* 03/20/2011   Lab Results  Component Value Date   LDLCALC 67 03/20/2011   Lab Results  Component Value Date   TRIG 125.0 03/20/2011   Lab Results  Component Value Date   CHOLHDL 5 03/20/2011     Assessment & Plan  Pneumonia CXR today shows b/l lower lobe opacities, given Hydromet to help qhs for cough, finish Azithromycin, Mucinex bid report worsening symptoms  HYPERTENSION Well controlled

## 2012-02-10 ENCOUNTER — Ambulatory Visit (HOSPITAL_BASED_OUTPATIENT_CLINIC_OR_DEPARTMENT_OTHER)
Admission: RE | Admit: 2012-02-10 | Discharge: 2012-02-10 | Disposition: A | Discharge status: Home / Self Care | Payer: Medicare Other | Source: Ambulatory Visit | Attending: Family Medicine | Admitting: Family Medicine

## 2012-02-10 DIAGNOSIS — R05 Cough: Secondary | ICD-10-CM | POA: Insufficient documentation

## 2012-02-10 DIAGNOSIS — J189 Pneumonia, unspecified organism: Secondary | ICD-10-CM

## 2012-02-10 DIAGNOSIS — R059 Cough, unspecified: Secondary | ICD-10-CM | POA: Insufficient documentation

## 2012-02-10 DIAGNOSIS — J8410 Pulmonary fibrosis, unspecified: Secondary | ICD-10-CM | POA: Insufficient documentation

## 2012-02-10 MED ORDER — IOHEXOL 300 MG/ML  SOLN
80.0000 mL | Freq: Once | INTRAMUSCULAR | Status: AC | PRN
  Administered 2012-02-10: 80 mL via INTRAVENOUS

## 2012-02-11 ENCOUNTER — Telehealth: Payer: Self-pay

## 2012-02-11 NOTE — Progress Notes (Signed)
Quick Note:  Patient Informed and voiced understanding ______ 

## 2012-02-11 NOTE — Progress Notes (Signed)
Quick Note:  Patient Informed and voiced understanding. Pt states he is going to think about going to a pulmonologist and will call back if he decides to. ______

## 2012-02-11 NOTE — Telephone Encounter (Signed)
Message copied by Varney Daily on Thu Feb 11, 2012 12:59 PM ------      Message from: [place], [place]: Thu Feb 11, 2012 12:12 PM       Please check with him, does he want to see Dr Gwenette Greet or is he feeling better, have them sign the ABN. I would like to have a vitamin d level checked but I do agree that he should start Vitamin D 1000IU daily OTC no matter what      ----- Message -----         From: Clayton Bibles McIvor         Sent: 02/11/2012  11:25 AM           To: Mosie Lukes, MD            I called the patient and spoke with his wife. They do not want to sign the ABN form at this time with all the medical situations they have going on and would like to try OTC supplements like vit d and calcium.      ----- Message -----         From: Mosie Lukes, MD         Sent: 02/09/2012   4:58 PM           To: Clayton Bibles McIvor            He had a low calcium is it possible to check a vitamin d level for hypocalcemia or do we need a different tube

## 2012-02-12 NOTE — Telephone Encounter (Signed)
Pt is going to think about it and call us back if he would like to proceed.

## 2012-02-22 ENCOUNTER — Telehealth: Payer: Self-pay | Admitting: Family Medicine

## 2012-02-22 NOTE — Telephone Encounter (Signed)
Notified pt.  He is agreeable.

## 2012-02-22 NOTE — Telephone Encounter (Signed)
Pt had pneumonia vaccine 08/26/07 when he was 62.  Does he need booster?

## 2012-02-22 NOTE — Telephone Encounter (Signed)
No, he does not need a booster.-thx

## 2012-02-23 ENCOUNTER — Ambulatory Visit: Payer: Medicare Other

## 2012-02-23 ENCOUNTER — Telehealth: Payer: Self-pay | Admitting: Family Medicine

## 2012-02-23 NOTE — Telephone Encounter (Signed)
Ok with me 

## 2012-02-23 NOTE — Telephone Encounter (Signed)
Patient has switched from Dr Jenny Reichmann to Dr Anitra Lauth in [facility] but he would not like to switch back and re-establish with Dr Jenny Reichmann, please advise  Thanks

## 2012-02-24 NOTE — Telephone Encounter (Signed)
PT AWARE / APPT WITH DR Jenny Reichmann NOV 15

## 2012-02-25 ENCOUNTER — Ambulatory Visit: Payer: Medicare Other

## 2012-03-09 ENCOUNTER — Ambulatory Visit: Payer: Medicare Other | Admitting: Family Medicine

## 2012-03-25 ENCOUNTER — Encounter: Payer: Self-pay | Admitting: Internal Medicine

## 2012-03-25 ENCOUNTER — Ambulatory Visit (INDEPENDENT_AMBULATORY_CARE_PROVIDER_SITE_OTHER): Payer: Medicare Other | Admitting: Internal Medicine

## 2012-03-25 VITALS — BP 118/64 | HR 68 | Ht 72.0 in | Wt 230.0 lb

## 2012-03-25 DIAGNOSIS — R0602 Shortness of breath: Secondary | ICD-10-CM

## 2012-03-25 DIAGNOSIS — I25810 Atherosclerosis of coronary artery bypass graft(s) without angina pectoris: Secondary | ICD-10-CM

## 2012-03-25 NOTE — Progress Notes (Signed)
HPI [name] is a 71 year old gentleman. He has a history of coronary artery disease. I last saw the patient back in May of last year. Last cardiac catheterization (11/2008) showed 100% left main RCA was diffusely diseased 50-60% proximal 50% mid. Vessel is occluded after the PDA, SVG RCA was occluded ostially, SVG to ramus was ectatic with 30% blockage in the midsection. SVG to diagonal was ectatic 40% lesion. Diagonal was patent. Upper branch had a 99% stenosis, but was a small vessel. There were left to right collaterals. Left subclavian tortuous. No flow-limiting plaque, LIMA to LAD was patent. LAD was small. Abdominal aortogram showed patent renal arteries. 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  He is s/p PPM for [facility].   I saw the patient in May Since he has had this placed he has felt better. Energy is better. Denies SOB No dizziness. NO CP.   Was sick a few wks ago  Bronchitis.  Treated with Abx  Prior to this he had been feeling good.  No Cp.  Breathing had been OK No Known Allergies  Current Outpatient Prescriptions  Medication Sig Dispense Refill  . ALPRAZolam (XANAX) 0.25 MG tablet Take 0.25 mg by mouth daily as needed. anxiety      . amLODipine (NORVASC) 5 MG tablet Take 1 tablet (5 mg total) by mouth daily.  90 tablet  3  . aspirin EC 325 MG tablet Take 325 mg by mouth daily.      . Calcium Carbonate-Vit D-Min (CALCIUM 1200 PO) Take by mouth. 2 daily      . Cholecalciferol (VITAMIN D-3 PO) Take by mouth. 1 tab ndaily      . citalopram (CELEXA) 20 MG tablet Take 1 tablet (20 mg total) by mouth daily.  90 tablet  3  . Glucosamine-Chondroitin (OSTEO BI-FLEX REGULAR STRENGTH PO) Take by mouth. daily      . lisinopril (PRINIVIL,ZESTRIL) 10 MG tablet Take 1 tablet (10 mg total) by mouth daily.  90 tablet  3  . Multiple Vitamin (MULTIVITAMIN) tablet Take 1 tablet by mouth daily.       . Multiple Vitamins-Minerals (ICAPS) CAPS Take by mouth. daily      . NON FORMULARY Apple cider vinegar      . Omega-3 Fatty Acids (FISH OIL) 1200 MG CAPS Take 1 capsule by mouth daily.      Marland Kitchen omeprazole (PRILOSEC) 40 MG capsule Take 1 capsule (40 mg total) by mouth daily.  90 capsule  3  . simvastatin (ZOCOR) 40 MG tablet Take 1 tablet (40 mg total) by mouth at bedtime.  90 tablet  3    Past Medical History  Diagnosis Date  . GI bleed   . Nausea and vomiting   . Abrasion of knee, left   . Shoulder joint pain   . Coronary artery disease s/p CABG 1990s     cath 2009 Nl LV fn, T SVG>>RCA  . Back pain   . Hip pain, right   . PSA (psoriatic arthritis)     increased  . Lumbar disc disease   . Malignant neoplasm of prostate   . Fatigue   . COPD (chronic obstructive pulmonary disease)   . Renal calculus   . Osteoarthritis   . HTN (hypertension)   . Depression   . Anxiety   . ED (erectile dysfunction)   . Hyperlipidemia   . GERD (gastroesophageal  reflux disease)   . Allergic rhinitis   . Peptic stricture of esophagus   . Pacemaker  [employer] [facility]     DOI 08/2011  . Complete heart block 09/12/11    Pacer inserted by Dr. Caryl Comes  . Acute bronchitis 02/07/2012    Past Surgical History  Procedure Date  . Coronary angioplasty 06/03/2007, 01/25/2009  . Nasal septum surgery april 2012    dr britt, [facility] - [place]  . Coronary artery bypass graft 20 yrs ago    LIMA to LAD, SVG to PDA, SVG to Ramus, SVG to D    Family History  Problem Relation Age of Onset  . Heart attack Father   . Heart disease Father   . Colon cancer Neg Hx   . Cancer Neg Hx     Colon    History   Social History  . Marital Status: Married    Spouse Name: N/A    Number of Children: N/A  . Years of Education: N/A   Occupational History  . Retired    Social History Main Topics  . Smoking status: Former Smoker    Quit date: 09/11/2001  . Smokeless tobacco: Never Used   Comment: Pt does not get regular  exercise  . Alcohol Use: No  . Drug Use: No  . Sexually Active: Not on file   Other Topics Concern  . 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), now living in [place].+Hx of smoking, quit about 2003.    Review of Systems:  All systems reviewed.  They are negative to the above problem except as previously stated.  Vital Signs: BP 118/64  Pulse 68  Ht 6' (1.829 m)  Wt 230 lb (104.327 kg)  BMI 31.19 kg/m2  Physical Exam Patient is in NAD. HEENT:  Normocephalic, atraumatic. EOMI, PERRLA.  Neck: JVP is normal.  No bruits.  Lungs: clear to auscultation. No rales no wheezes.  Heart: Regular rate and rhythm. Normal S1, S2. No S3.   No significant murmurs. PMI not displaced.  Abdomen:  Supple, nontender. Normal bowel sounds. No masses. No hepatomegaly.  Extremities:   Good distal pulses throughout. No lower extremity edema.  Musculoskeletal :moving all extremities.  Neuro:   alert and oriented x3.  CN II-XII grossly intact.  EKG  SR  68 bpm.  Paced   Assessment and Plan:  1.  CAD s/p CABG  No symptoms to sugg active angina  2.  HTN  Controlled  3.  HL  Keep on same regimen.  Tolerating  4.  PVOD  Asymptomatic.

## 2012-03-25 NOTE — Patient Instructions (Signed)
You have been referred to Pulmonary Dept.

## 2012-03-31 ENCOUNTER — Encounter: Payer: Self-pay | Admitting: Critical Care Medicine

## 2012-03-31 ENCOUNTER — Ambulatory Visit (INDEPENDENT_AMBULATORY_CARE_PROVIDER_SITE_OTHER): Payer: Medicare Other | Admitting: Critical Care Medicine

## 2012-03-31 VITALS — BP 110/76 | HR 86 | Temp 97.5°F | Ht 72.0 in | Wt 229.0 lb

## 2012-03-31 DIAGNOSIS — J84112 Idiopathic pulmonary fibrosis: Secondary | ICD-10-CM | POA: Insufficient documentation

## 2012-03-31 DIAGNOSIS — J8410 Pulmonary fibrosis, unspecified: Secondary | ICD-10-CM

## 2012-03-31 DIAGNOSIS — J449 Chronic obstructive pulmonary disease, unspecified: Secondary | ICD-10-CM

## 2012-03-31 LAB — SEDIMENTATION RATE: Sed Rate: 1 mm/hr (ref 0–16)

## 2012-03-31 MED ORDER — ALBUTEROL SULFATE HFA 108 (90 BASE) MCG/ACT IN AERS: 2.0000 | INHALATION_SPRAY | Freq: Four times a day (QID) | RESPIRATORY_TRACT | Status: DC | PRN

## 2012-03-31 MED ORDER — TIOTROPIUM BROMIDE MONOHYDRATE 18 MCG IN CAPS: 18.0000 ug | ORAL_CAPSULE | Freq: Every day | RESPIRATORY_TRACT | Status: DC

## 2012-03-31 NOTE — Assessment & Plan Note (Signed)
Copd ?stage. Exertional dyspnea noted. 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 Return 1 month

## 2012-03-31 NOTE — Patient Instructions (Addendum)
Start Spiriva daily Use proair as needed Obtain pulmonary function studies and 6 minute walk Labs today Return 1 month

## 2012-03-31 NOTE — Progress Notes (Signed)
Subjective:    Patient ID: Wayne Torres, male    DOB: 1940-11-01, 71 y.o.   MRN: [id number]  HPI Comments: Pt notes progressive dyspnea for past few months and now with progressive hypoxemia with exertion.  Shortness of Breath This is a chronic problem. The current episode started more than 1 year ago. The problem occurs daily (dyspnea now only with exertion: walking). The problem has been gradually worsening. The average episode lasts 5 seconds (episodes of dyspnea recover quickly with rest). Associated symptoms include rhinorrhea. Pertinent negatives include no abdominal pain, chest pain, claudication, coryza, ear pain, fever, headaches, hemoptysis, leg pain, leg swelling, neck pain, orthopnea, PND, rash, sore throat, sputum production, swollen glands, syncope, vomiting or wheezing. The symptoms are aggravated by any activity and exercise. Risk factors include smoking. He has tried beta agonist inhalers for the symptoms. The treatment provided moderate relief. His past medical history is significant for CAD and pneumonia. There is no history of allergies, asthma, bronchiolitis, COPD, DVT, a heart failure or a recent surgery.    71 y.o.WM  Pt ill first of October. Dx bronchitis. Rx ABX, inhalers.  Inhaler helped. Pt had cough at that time Mucus was present, green in color.  No blood.  No chest pain.   Past Medical History  Diagnosis Date  . GI bleed   . Nausea and vomiting   . Abrasion of knee, left   . Shoulder joint pain   . Coronary artery disease s/p CABG 1990s     cath 2009 Nl LV fn, T SVG>>RCA  . Back pain   . Hip pain, right   . PSA (psoriatic arthritis)     increased  . Lumbar disc disease   . Malignant neoplasm of prostate   . Fatigue   . COPD (chronic obstructive pulmonary disease)   . Renal calculus   . Osteoarthritis   . HTN (hypertension)   . Depression   . Anxiety   . ED (erectile dysfunction)   . Hyperlipidemia   . GERD (gastroesophageal reflux disease)   .  Allergic rhinitis   . Peptic stricture of esophagus   . Pacemaker  [facility]     DOI 08/2011  . Complete heart block 09/12/11    Pacer inserted by Dr. Caryl Comes  . Acute bronchitis 02/07/2012     Family History  Problem Relation Age of Onset  . Heart attack Father   . Heart disease Father   . Colon cancer Neg Hx   . Cancer Neg Hx     Colon     History   Social History  . Marital Status: Married    Spouse Name: N/A    Number of Children: 2  . Years of Education: N/A   Occupational History  . Retired    Social History Main Topics  . Smoking status: Former Smoker -- 1.5 packs/day for 42 years    Types: Cigarettes    Quit date: 09/11/2001  . Smokeless tobacco: Never Used     Comment: Pt does not get regular exercise  . Alcohol Use: No  . Drug Use: No  . Sexually Active: Not on file   Other Topics Concern  . 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     No Known Allergies   Outpatient Prescriptions Prior to Visit  Medication Sig Dispense Refill  . ALPRAZolam (XANAX) 0.25 MG  tablet Take 0.25 mg by mouth daily as needed. anxiety      . amLODipine (NORVASC) 5 MG tablet Take 1 tablet (5 mg total) by mouth daily.  90 tablet  3  . aspirin EC 325 MG tablet Take 325 mg by mouth daily.      . Calcium Carbonate-Vit D-Min (CALCIUM 1200 PO) Take by mouth. 2 daily      . Cholecalciferol (VITAMIN D-3 PO) Take by mouth. 1 tab ndaily      . citalopram (CELEXA) 20 MG tablet Take 1 tablet (20 mg total) by mouth daily.  90 tablet  3  . Glucosamine-Chondroitin (OSTEO BI-FLEX REGULAR STRENGTH PO) Take by mouth. daily      . lisinopril (PRINIVIL,ZESTRIL) 10 MG tablet Take 1 tablet (10 mg total) by mouth daily.  90 tablet  3  . Multiple Vitamin (MULTIVITAMIN) tablet Take 1 tablet by mouth daily.      . Multiple Vitamins-Minerals (ICAPS) CAPS Take by mouth. daily      . NON  FORMULARY Apple cider vinegar      . Omega-3 Fatty Acids (FISH OIL) 1200 MG CAPS Take 1 capsule by mouth daily.      Marland Kitchen omeprazole (PRILOSEC) 40 MG capsule Take 1 capsule (40 mg total) by mouth daily.  90 capsule  3  . simvastatin (ZOCOR) 40 MG tablet Take 1 tablet (40 mg total) by mouth at bedtime.  90 tablet  3   Last reviewed on 03/31/2012 10:52 AM by Elsie Stain, MD   Review of Systems  Constitutional: Negative for fever, chills, diaphoresis, activity change, appetite change, fatigue and unexpected weight change.  HENT: Positive for congestion, rhinorrhea, postnasal drip and sinus pressure. Negative for hearing loss, ear pain, nosebleeds, sore throat, facial swelling, sneezing, mouth sores, trouble swallowing, neck pain, neck stiffness, dental problem, voice change, tinnitus and ear discharge.   Eyes: Negative for photophobia, discharge, itching and visual disturbance.  Respiratory: Negative for apnea, cough, hemoptysis, sputum production, choking, chest tightness, shortness of breath, wheezing and stridor.   Cardiovascular: Negative for chest pain, palpitations, orthopnea, claudication, leg swelling, syncope and PND.  Gastrointestinal: Negative for nausea, vomiting, abdominal pain, constipation, blood in stool and abdominal distention.  Genitourinary: Negative for dysuria, urgency, frequency, hematuria, flank pain, decreased urine volume and difficulty urinating.  Musculoskeletal: Positive for back pain. Negative for myalgias, joint swelling, arthralgias and gait problem.  Skin: Negative for color change, pallor and rash.  Neurological: Negative for dizziness, tremors, seizures, syncope, speech difficulty, weakness, light-headedness, numbness and headaches.  Hematological: Negative for adenopathy. Does not bruise/bleed easily.  Psychiatric/Behavioral: Negative for confusion, sleep disturbance and agitation. The patient is not nervous/anxious.        Objective:   Physical  Exam  Filed Vitals:   03/31/12 1031  BP: 110/76  Pulse: 86  Temp: 97.5 F (36.4 C)  TempSrc: Oral  Height: 6' (1.829 m)  Weight: 229 lb (103.874 kg)  SpO2: 94%    Gen: Pleasant, well-nourished, in no distress,  normal affect  ENT: No lesions,  mouth clear,  oropharynx clear, no postnasal drip  Neck: No JVD, no TMG, no carotid bruits  Lungs: No use of accessory muscles, no dullness to percussion, distant BS, dry rales bibasilar  Cardiovascular: RRR, heart sounds normal, no murmur or gallops, no peripheral edema  Abdomen: soft and NT, no HSM,  BS normal  Musculoskeletal: No deformities, no cyanosis or clubbing  Neuro: alert, non focal  Skin: Warm, no lesions or rashes  CT Chest 01/2012: IMPRESSION:  1. Chronic pulmonary interstitial fibrosis, which shows some  interval progression compared to previous study in 2005.  2. No acute or superimposed lung disease.       Assessment & Plan:   COPD Copd ?stage. Exertional dyspnea noted. 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 Return 1 month    Updated Medication List Outpatient Encounter Prescriptions as of 03/31/2012  Medication Sig Dispense Refill  . ALPRAZolam (XANAX) 0.25 MG tablet Take 0.25 mg by mouth daily as needed. anxiety      . amLODipine (NORVASC) 5 MG tablet Take 1 tablet (5 mg total) by mouth daily.  90 tablet  3  . aspirin EC 325 MG tablet Take 325 mg by mouth daily.      . Calcium Carbonate-Vit D-Min (CALCIUM 1200 PO) Take by mouth. 2 daily      . Cholecalciferol (VITAMIN D-3 PO) Take by mouth. 1 tab ndaily      . citalopram (CELEXA) 20 MG tablet Take 1 tablet (20 mg total) by mouth daily.  90 tablet  3  . Glucosamine-Chondroitin (OSTEO BI-FLEX REGULAR STRENGTH PO) Take by mouth. daily      . lisinopril (PRINIVIL,ZESTRIL) 10 MG tablet  Take 1 tablet (10 mg total) by mouth daily.  90 tablet  3  . Multiple Vitamin (MULTIVITAMIN) tablet Take 1 tablet by mouth daily.      . Multiple Vitamins-Minerals (ICAPS) CAPS Take by mouth. daily      . NON FORMULARY Apple cider vinegar      . Omega-3 Fatty Acids (FISH OIL) 1200 MG CAPS Take 1 capsule by mouth daily.      Marland Kitchen omeprazole (PRILOSEC) 40 MG capsule Take 1 capsule (40 mg total) by mouth daily.  90 capsule  3  . simvastatin (ZOCOR) 40 MG tablet Take 1 tablet (40 mg total) by mouth at bedtime.  90 tablet  3  . albuterol (PROVENTIL HFA;VENTOLIN HFA) 108 (90 BASE) MCG/ACT inhaler Inhale 2 puffs into the lungs every 6 (six) hours as needed for wheezing or shortness of breath.  1 Inhaler  0  . tiotropium (SPIRIVA HANDIHALER) 18 MCG inhalation capsule Place 1 capsule (18 mcg total) into inhaler and inhale daily.  30 capsule  0  . [DISCONTINUED] albuterol (PROVENTIL HFA;VENTOLIN HFA) 108 (90 BASE) MCG/ACT inhaler Inhale 2 puffs into the lungs every 6 (six) hours as needed for wheezing or shortness of breath.  1 Inhaler  6  . [DISCONTINUED] tiotropium (SPIRIVA HANDIHALER) 18 MCG inhalation capsule Place 1 capsule (18 mcg total) into inhaler and inhale daily.  30 capsule  6

## 2012-04-01 LAB — ALLERGY FULL PROFILE
Allergen,Goose feathers, e70: <0.1 kU/L
Alternaria Alternata: <0.1 kU/L
Aspergillus fumigatus, IgG: 0.59 kU/L — ABNORMAL HIGH
Bermuda Grass: <0.1 kU/L
Candida Albicans: <0.1 kU/L
Cat Dander: <0.1 kU/L
Common Ragweed: <0.1 kU/L
D. farinae: <0.1 kU/L
Dog Dander: <0.1 kU/L
Elm IgE: <0.1 kU/L
House Dust Hollister: <0.1 kU/L
IgE (Immunoglobulin E), Serum: 122.1 IU/mL (ref 0.0–180.0)
Lamb's Quarters: <0.1 kU/L
Plantain: <0.1 kU/L
Stemphylium Botryosum: <0.1 kU/L

## 2012-04-02 LAB — ALPHA-1-ANTITRYPSIN: A-1 Antitrypsin, Ser: 172 mg/dL (ref 90–200)

## 2012-04-04 NOTE — Progress Notes (Signed)
Quick Note:  [email or web address] ______ 

## 2012-04-08 ENCOUNTER — Ambulatory Visit (INDEPENDENT_AMBULATORY_CARE_PROVIDER_SITE_OTHER): Payer: Medicare Other | Admitting: Internal Medicine

## 2012-04-08 ENCOUNTER — Other Ambulatory Visit (INDEPENDENT_AMBULATORY_CARE_PROVIDER_SITE_OTHER): Payer: Medicare Other

## 2012-04-08 ENCOUNTER — Encounter: Payer: Self-pay | Admitting: Internal Medicine

## 2012-04-08 VITALS — BP 102/62 | HR 66 | Temp 96.7°F | Ht 72.0 in | Wt 234.0 lb

## 2012-04-08 DIAGNOSIS — I10 Essential (primary) hypertension: Secondary | ICD-10-CM

## 2012-04-08 DIAGNOSIS — E785 Hyperlipidemia, unspecified: Secondary | ICD-10-CM

## 2012-04-08 DIAGNOSIS — R972 Elevated prostate specific antigen [PSA]: Secondary | ICD-10-CM

## 2012-04-08 DIAGNOSIS — F411 Generalized anxiety disorder: Secondary | ICD-10-CM

## 2012-04-08 LAB — HEPATIC FUNCTION PANEL
ALT: 26 U/L (ref 0–53)
Bilirubin, Direct: 0.2 mg/dL (ref 0.0–0.3)
Total Bilirubin: 0.8 mg/dL (ref 0.3–1.2)

## 2012-04-08 LAB — URINALYSIS, ROUTINE W REFLEX MICROSCOPIC
Bilirubin Urine: NEGATIVE
Hgb urine dipstick: NEGATIVE
Nitrite: NEGATIVE
Total Protein, Urine: NEGATIVE
Urine Glucose: NEGATIVE
pH: 5.5 (ref 5.0–8.0)

## 2012-04-08 LAB — BASIC METABOLIC PANEL
BUN: 16 mg/dL (ref 6–23)
Calcium: 9.4 mg/dL (ref 8.4–10.5)
Chloride: 106 mEq/L (ref 96–112)
Creatinine, Ser: 1.2 mg/dL (ref 0.4–1.5)
GFR: 66.15 mL/min (ref >60.00)

## 2012-04-08 LAB — CBC WITH DIFFERENTIAL/PLATELET
Basophils Relative: 0.6 % (ref 0.0–3.0)
Eosinophils Absolute: 0.3 10*3/uL (ref 0.0–0.7)
Eosinophils Relative: 3.4 % (ref 0.0–5.0)
HCT: 45.8 % (ref 39.0–52.0)
Hemoglobin: 15.3 g/dL (ref 13.0–17.0)
Lymphs Abs: 1.9 10*3/uL (ref 0.7–4.0)
MCHC: 33.4 g/dL (ref 30.0–36.0)
MCV: 94.4 fl (ref 78.0–100.0)
Monocytes Absolute: 0.9 10*3/uL (ref 0.1–1.0)
Neutro Abs: 5.7 10*3/uL (ref 1.4–7.7)
Neutrophils Relative %: 63.9 % (ref 43.0–77.0)
RBC: 4.85 Mil/uL (ref 4.22–5.81)
WBC: 8.9 10*3/uL (ref 4.5–10.5)

## 2012-04-08 LAB — LIPID PANEL
Cholesterol: 110 mg/dL (ref 0–200)
LDL Cholesterol: 55 mg/dL (ref 0–99)
Total CHOL/HDL Ratio: 4
Triglycerides: 134 mg/dL (ref 0.0–149.0)
VLDL: 26.8 mg/dL (ref 0.0–40.0)

## 2012-04-08 LAB — PSA: PSA: 1.88 ng/mL (ref 0.10–4.00)

## 2012-04-08 MED ORDER — ALPRAZOLAM 0.25 MG PO TABS: 0.2500 mg | ORAL_TABLET | Freq: Every day | ORAL | Status: DC | PRN

## 2012-04-08 NOTE — Assessment & Plan Note (Signed)
stable overall by hx and exam, most recent data reviewed with pt, and pt to continue medical treatment as before BP Readings from Last 3 Encounters:  04/08/12 102/62  03/31/12 110/76  03/25/12 118/64

## 2012-04-08 NOTE — Assessment & Plan Note (Signed)
stable overall by hx and exam, most recent data reviewed with pt, and pt to continue medical treatment as before  Lab Results  Component Value Date   LDLCALC 67 03/20/2011    

## 2012-04-08 NOTE — Assessment & Plan Note (Signed)
Also due for psa - will order

## 2012-04-08 NOTE — Progress Notes (Signed)
Subjective:    Patient ID: Wayne Torres, male    DOB: 07-19-1940, 71 y.o.   MRN: [id number]  HPI  Here to f/u; overall doing ok,  Pt denies chest pain, increased sob or doe, wheezing, orthopnea, PND, increased LE swelling, palpitations, dizziness or syncope.  Pt denies new neurological symptoms such as new headache, or facial or extremity weakness or numbness   Pt denies polydipsia, polyuria, or low sugar symptoms such as weakness or confusion improved with po intake.  Pt states overall good compliance with meds, trying to follow lower cholesterol diet, wt overall stable but little exercise however.  Has chronic left eye blurred vision, sees opthomologist with some improvement after shots for  what sounds like possible macular degeneration. Sees cardiology twice per yr, also regular pacemaker checks.  Seeing pulm recently and improved after episode severe bronchitis recent per pt.  OK for labs today, but declines colonoscopy due to other more pressing co-morbid issues per pt.  Denies worsening depressive symptoms, suicidal ideation, or panic, though has ongoing anxiety, needs refill Past Medical History  Diagnosis Date  . GI bleed   . Nausea and vomiting   . Abrasion of knee, left   . Shoulder joint pain   . Coronary artery disease s/p CABG 1990s     cath 2009 Nl LV fn, T SVG>>RCA  . Back pain   . Hip pain, right   . PSA (psoriatic arthritis)     increased  . Lumbar disc disease   . Malignant neoplasm of prostate   . Fatigue   . COPD (chronic obstructive pulmonary disease)   . Renal calculus   . Osteoarthritis   . HTN (hypertension)   . Depression   . Anxiety   . ED (erectile dysfunction)   . Hyperlipidemia   . GERD (gastroesophageal reflux disease)   . Allergic rhinitis   . Peptic stricture of esophagus   . Pacemaker  [facility]     DOI 08/2011  . Complete heart block 09/12/11    Pacer inserted by Dr. Caryl Comes  . Acute bronchitis 02/07/2012   Past Surgical History  Procedure Date    . Coronary angioplasty 06/03/2007, 01/25/2009  . Nasal septum surgery april 2012    dr britt, [facility] [place]  . Coronary artery bypass graft 20 yrs ago    LIMA to LAD, SVG to PDA, SVG to Ramus, SVG to D  . Pacemaker placement 08/2011    reports that he quit smoking about 10 years ago. His smoking use included Cigarettes. He has a 63 pack-year smoking history. He has never used smokeless tobacco. He reports that he does not drink alcohol or use illicit drugs. family history includes Heart attack in his father and Heart disease in his father.  There is no history of Colon cancer and Cancer. No Known Allergies Current Outpatient Prescriptions on File Prior to Visit  Medication Sig Dispense Refill  . albuterol (PROVENTIL HFA;VENTOLIN HFA) 108 (90 BASE) MCG/ACT inhaler Inhale 2 puffs into the lungs every 6 (six) hours as needed for wheezing or shortness of breath.  1 Inhaler  0  . ALPRAZolam (XANAX) 0.25 MG tablet Take 0.25 mg by mouth daily as needed. anxiety      . amLODipine (NORVASC) 5 MG tablet Take 1 tablet (5 mg total) by mouth daily.  90 tablet  3  . aspirin EC 325 MG tablet Take 325 mg by mouth daily.      . Calcium Carbonate-Vit D-Min (CALCIUM  1200 PO) Take by mouth. 2 daily      . Cholecalciferol (VITAMIN D-3 PO) Take by mouth. 1 tab ndaily      . citalopram (CELEXA) 20 MG tablet Take 1 tablet (20 mg total) by mouth daily.  90 tablet  3  . Glucosamine-Chondroitin (OSTEO BI-FLEX REGULAR STRENGTH PO) Take by mouth. daily      . lisinopril (PRINIVIL,ZESTRIL) 10 MG tablet Take 1 tablet (10 mg total) by mouth daily.  90 tablet  3  . Multiple Vitamin (MULTIVITAMIN) tablet Take 1 tablet by mouth daily.      . Multiple Vitamins-Minerals (ICAPS) CAPS Take by mouth. daily      . NON FORMULARY Apple cider vinegar      . Omega-3 Fatty Acids (FISH OIL) 1200 MG CAPS Take 1 capsule by mouth daily.      Marland Kitchen omeprazole (PRILOSEC) 40 MG capsule Take 1 capsule (40 mg total) by mouth daily.  90 capsule  3  .  simvastatin (ZOCOR) 40 MG tablet Take 1 tablet (40 mg total) by mouth at bedtime.  90 tablet  3  . tiotropium (SPIRIVA HANDIHALER) 18 MCG inhalation capsule Place 1 capsule (18 mcg total) into inhaler and inhale daily.  30 capsule  0   Review of Systems  Constitutional: Negative for diaphoresis and unexpected weight change.  HENT: Negative for tinnitus.   Eyes: Negative for photophobia and visual disturbance.  Respiratory: Negative for choking and stridor.   Gastrointestinal: Negative for vomiting and blood in stool.  Genitourinary: Negative for hematuria and decreased urine volume.  Musculoskeletal: Negative for gait problem.  Skin: Negative for color change and wound.  Neurological: Negative for tremors and numbness.  Psychiatric/Behavioral: Negative for decreased concentration. The patient is not hyperactive.       Objective:   Physical Exam BP 102/62  Pulse 66  Temp 96.7 F (35.9 C) (Oral)  Ht 6' (1.829 m)  Wt 234 lb (106.142 kg)  BMI 31.74 kg/m2  SpO2 93% Physical Exam  VS noted Constitutional: Pt appears well-developed and well-nourished.  HENT: Head: Normocephalic.  Right Ear: External ear normal.  Left Ear: External ear normal.  Eyes: Conjunctivae and EOM are normal. Pupils are equal, round, and reactive to light.  Neck: Normal range of motion. Neck supple.  Cardiovascular: Normal rate and regular rhythm.   Pulmonary/Chest: Effort normal and breath sounds normal.  Abd:  Soft, NT, non-distended, + BS Neurological: Pt is alert. Not confused  Skin: Skin is warm. No erythema.  Psychiatric: Pt behavior is normal. Thought content normal. 1+ nervous    Assessment & Plan:

## 2012-04-08 NOTE — Assessment & Plan Note (Signed)
stable overall by hx and exam, most recent data reviewed with pt, and pt to continue medical treatment as before Lab Results  Component Value Date   WBC 9.4 02/09/2012   HGB 13.7 02/09/2012   HCT 42.5 02/09/2012   PLT 270.0 02/09/2012   GLUCOSE 98 02/09/2012   CHOL 116 03/20/2011   TRIG 125.0 03/20/2011   HDL 24.30* 03/20/2011   LDLCALC 67 03/20/2011   ALT 8 09/12/2011   AST 12 09/12/2011   NA 135 02/09/2012   K 3.7 02/09/2012   CL 106 02/09/2012   CREATININE 1.2 02/09/2012   BUN 18 02/09/2012   CO2 21 02/09/2012   TSH 1.464 09/12/2011   PSA 1.82 03/20/2011   INR 1.11 09/12/2011

## 2012-04-08 NOTE — Patient Instructions (Addendum)
Continue all other medications as before Your xanax was refilled today You are given the spiriva samples today Please have the pharmacy call with any other refills you may need. Please go to LAB in the [facility] for the blood and/or urine tests to be done today You will be contacted by phone if any changes need to be made immediately.  Otherwise, you will receive a letter about your results with an explanation, but please check with MyChart first. Please call if you change your mind about having the colonoscopy Please let us know if you want the shingles shot; here it is about $250;  At [facility] it would be about $180 (and needs a prescription order) Please continue your efforts at being more active, low cholesterol diet, and weight control. Please keep your appointments with your specialists as you have planned Please return in 1 year for your yearly visit, or sooner if needed

## 2012-04-11 LAB — HYPERSENSITIVITY PNUEMONITIS PROFILE

## 2012-04-12 NOTE — Progress Notes (Signed)
Quick Note:  Call the pt and tell him his mold study is positive for mold exposure. We will discuss further at his next OV ______

## 2012-04-13 NOTE — Progress Notes (Signed)
Quick Note:  [email or web address] ______

## 2012-04-14 NOTE — Progress Notes (Signed)
Quick Note:  [email or web address] ______

## 2012-04-15 ENCOUNTER — Telehealth: Payer: Self-pay | Admitting: Critical Care Medicine

## 2012-04-15 NOTE — Progress Notes (Signed)
Quick Note:  [email or web address] ______ 

## 2012-04-18 ENCOUNTER — Ambulatory Visit (INDEPENDENT_AMBULATORY_CARE_PROVIDER_SITE_OTHER): Payer: Medicare Other | Admitting: Critical Care Medicine

## 2012-04-18 ENCOUNTER — Encounter (INDEPENDENT_AMBULATORY_CARE_PROVIDER_SITE_OTHER): Payer: Medicare Other

## 2012-04-18 DIAGNOSIS — R0609 Other forms of dyspnea: Secondary | ICD-10-CM

## 2012-04-18 DIAGNOSIS — R0600 Dyspnea, unspecified: Secondary | ICD-10-CM

## 2012-04-18 DIAGNOSIS — J8410 Pulmonary fibrosis, unspecified: Secondary | ICD-10-CM

## 2012-04-18 DIAGNOSIS — R0989 Other specified symptoms and signs involving the circulatory and respiratory systems: Secondary | ICD-10-CM

## 2012-04-18 DIAGNOSIS — J449 Chronic obstructive pulmonary disease, unspecified: Secondary | ICD-10-CM

## 2012-04-18 LAB — PULMONARY FUNCTION TEST

## 2012-04-19 NOTE — Progress Notes (Signed)
Here for 6MW

## 2012-04-22 NOTE — Telephone Encounter (Signed)
Pt had SMW

## 2012-04-28 ENCOUNTER — Encounter: Payer: Self-pay | Admitting: Critical Care Medicine

## 2012-04-28 ENCOUNTER — Ambulatory Visit (INDEPENDENT_AMBULATORY_CARE_PROVIDER_SITE_OTHER): Payer: Medicare Other | Admitting: Critical Care Medicine

## 2012-04-28 VITALS — BP 122/82 | HR 71 | Temp 98.1°F | Ht 72.0 in | Wt 233.0 lb

## 2012-04-28 DIAGNOSIS — J8410 Pulmonary fibrosis, unspecified: Secondary | ICD-10-CM

## 2012-04-28 MED ORDER — MYCOPHENOLATE MOFETIL 500 MG PO TABS: 500.0000 mg | ORAL_TABLET | Freq: Two times a day (BID) | ORAL | Status: DC

## 2012-04-28 MED ORDER — SULFAMETHOXAZOLE-TMP DS 800-160 MG PO TABS: ORAL_TABLET | ORAL | Status: DC

## 2012-04-28 MED ORDER — PREDNISONE 10 MG PO TABS: ORAL_TABLET | ORAL | Status: DC

## 2012-04-28 NOTE — Assessment & Plan Note (Addendum)
Idiopathic pulmonary fibrosis with negative autoimmune serologic studies. Significant mold exposure on allergic profile noted. Patient denies significant mold exposure and his environment. Pulmonary function studies show moderate restriction and significant reduction in diffusion capacity. No evidence of obstructive lung disease seen on spirometry and no evidence of emphysema seen on CT scan despite prior smoking history Plan Start prednisone taper Start Cellcept (mycophenalate) one twice daily Take Bactrim one tablet three times weekly Stop spiriva Return 1 month with lab work rechecked

## 2012-04-28 NOTE — Patient Instructions (Addendum)
Start prednisone taper Start Cellcept (mycophenalate) one twice daily Take Bactrim one tablet three times weekly Stop spiriva Return 1 month with lab work rechecked

## 2012-04-28 NOTE — Progress Notes (Signed)
Subjective:    Patient ID: Wayne Torres, male    DOB: 12-Jun-1940, 71 y.o.   MRN: [id number]  HPI 04/28/2012 The patient notes no change in dyspnea with Spiriva treatment. The patient has undergone 6 minute walk and pulmonary function testing. The study showed moderate restriction on the total lung capacity and severe diffusion defect on DLCO Serologic studies are negative however allergy profile shows mold hypersensitivity Currently there is no chest pain, cough, or pedal edema. Pt denies any significant sore throat, nasal congestion or excess secretions, fever, chills, sweats, unintended weight loss, pleurtic or exertional chest pain, orthopnea PND, or leg swelling Pt denies any increase in rescue therapy over baseline, denies waking up needing it or having any early am or nocturnal exacerbations of coughing/wheezing/or dyspnea. Pt also denies any obvious fluctuation in symptoms with  weather or environmental change or other alleviating or aggravating factors    Past Medical History  Diagnosis Date  . GI bleed   . Nausea and vomiting   . Abrasion of knee, left   . Shoulder joint pain   . Coronary artery disease s/p CABG 1990s     cath 2009 Nl LV fn, T SVG>>RCA  . Back pain   . Hip pain, right   . PSA (psoriatic arthritis)     increased  . Lumbar disc disease   . Malignant neoplasm of prostate   . Fatigue   . COPD (chronic obstructive pulmonary disease)   . Renal calculus   . Osteoarthritis   . HTN (hypertension)   . Depression   . Anxiety   . ED (erectile dysfunction)   . Hyperlipidemia   . GERD (gastroesophageal reflux disease)   . Allergic rhinitis   . Peptic stricture of esophagus   . Pacemaker  [facility]     DOI 08/2011  . Complete heart block 09/12/11    Pacer inserted by Dr. Caryl Comes  . Acute bronchitis 02/07/2012     Family History  Problem Relation Age of Onset  . Heart attack Father   . Heart disease Father   . Colon cancer Neg Hx   . Cancer Neg Hx     Colon      History   Social History  . Marital Status: Married    Spouse Name: N/A    Number of Children: 2  . Years of Education: N/A   Occupational History  . Retired    Social History Main Topics  . Smoking status: Former Smoker -- 1.5 packs/day for 42 years    Types: Cigarettes    Quit date: 09/11/2001  . Smokeless tobacco: Never Used     Comment: Pt does not get regular exercise  . Alcohol Use: No  . Drug Use: No  . Sexually Active: Not on file   Other Topics Concern  . 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     No Known Allergies   Outpatient Prescriptions Prior to Visit  Medication Sig Dispense Refill  . albuterol (PROVENTIL HFA;VENTOLIN HFA) 108 (90 BASE) MCG/ACT inhaler Inhale 2 puffs into the lungs every 6 (six) hours as needed for wheezing or shortness of breath.  1 Inhaler  0  . ALPRAZolam (XANAX) 0.25 MG tablet Take 1 tablet (0.25 mg total) by mouth daily as needed. anxiety  30 tablet  5  . amLODipine (NORVASC) 5 MG tablet Take 1 tablet (  5 mg total) by mouth daily.  90 tablet  3  . aspirin EC 325 MG tablet Take 325 mg by mouth daily.      . Calcium Carbonate-Vit D-Min (CALCIUM 1200 PO) Take by mouth. 2 daily      . Cholecalciferol (VITAMIN D-3 PO) Take by mouth. 1 tab ndaily      . citalopram (CELEXA) 20 MG tablet Take 1 tablet (20 mg total) by mouth daily.  90 tablet  3  . Glucosamine-Chondroitin (OSTEO BI-FLEX REGULAR STRENGTH PO) Take by mouth. daily      . lisinopril (PRINIVIL,ZESTRIL) 10 MG tablet Take 1 tablet (10 mg total) by mouth daily.  90 tablet  3  . Multiple Vitamin (MULTIVITAMIN) tablet Take 1 tablet by mouth daily.      . NON FORMULARY Apple cider vinegar      . Omega-3 Fatty Acids (FISH OIL) 1200 MG CAPS Take 1 capsule by mouth daily.      Marland Kitchen omeprazole (PRILOSEC) 40 MG capsule Take 1 capsule (40 mg total) by mouth daily.  90 capsule   3  . simvastatin (ZOCOR) 40 MG tablet Take 1 tablet (40 mg total) by mouth at bedtime.  90 tablet  3  . Multiple Vitamins-Minerals (ICAPS) CAPS Take by mouth. daily      . tiotropium (SPIRIVA HANDIHALER) 18 MCG inhalation capsule Place 1 capsule (18 mcg total) into inhaler and inhale daily.  30 capsule  0      Review of Systems 11 point review of systems taken in detail and is negative except as noted in history present illness    Objective:   Physical Exam Filed Vitals:   04/28/12 0956  BP: 122/82  Pulse: 71  Temp: 98.1 F (36.7 C)  Height: 6' (1.829 m)  Weight: 233 lb (105.688 kg)  SpO2: 93%    Gen: Pleasant, well-nourished, in no distress,  normal affect  ENT: No lesions,  mouth clear,  oropharynx clear, no postnasal drip  Neck: No JVD, no TMG, no carotid bruits  Lungs: No use of accessory muscles, no dullness to percussion, dry rales at the bases Cardiovascular: RRR, heart sounds normal, no murmur or gallops, no peripheral edema  Abdomen: soft and NT, no HSM,  BS normal  Musculoskeletal: No deformities, no cyanosis or clubbing  Neuro: alert, non focal  Skin: Warm, no lesions or rashes  No results found.    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 Significant mold exposure on allergic profile noted. Patient denies significant mold exposure and his environment. Pulmonary function studies show moderate restriction and significant reduction in diffusion capacity. No evidence of obstructive lung disease seen on spirometry and no evidence of emphysema seen on CT scan despite prior smoking history Plan Start prednisone taper Start Cellcept (mycophenalate) one twice daily Take Bactrim one tablet three times weekly Stop spiriva Return 1 month with lab work  rechecked     Updated Medication List Outpatient Encounter Prescriptions as of 04/28/2012  Medication Sig Dispense Refill  . albuterol (PROVENTIL HFA;VENTOLIN HFA) 108 (90 BASE) MCG/ACT inhaler Inhale 2 puffs into the lungs every 6 (six) hours as needed for wheezing or shortness of breath.  1 Inhaler  0  . ALPRAZolam (XANAX) 0.25 MG tablet Take 1 tablet (  0.25 mg total) by mouth daily as needed. anxiety  30 tablet  5  . amLODipine (NORVASC) 5 MG tablet Take 1 tablet (5 mg total) by mouth daily.  90 tablet  3  . aspirin EC 325 MG tablet Take 325 mg by mouth daily.      . Calcium Carbonate-Vit D-Min (CALCIUM 1200 PO) Take by mouth. 2 daily      . Cholecalciferol (VITAMIN D-3 PO) Take by mouth. 1 tab ndaily      . citalopram (CELEXA) 20 MG tablet Take 1 tablet (20 mg total) by mouth daily.  90 tablet  3  . Glucosamine-Chondroitin (OSTEO BI-FLEX REGULAR STRENGTH PO) Take by mouth. daily      . lisinopril (PRINIVIL,ZESTRIL) 10 MG tablet Take 1 tablet (10 mg total) by mouth daily.  90 tablet  3  . Multiple Vitamin (MULTIVITAMIN) tablet Take 1 tablet by mouth daily.      . NON FORMULARY Apple cider vinegar      . Omega-3 Fatty Acids (FISH OIL) 1200 MG CAPS Take 1 capsule by mouth daily.      Marland Kitchen omeprazole (PRILOSEC) 40 MG capsule Take 1 capsule (40 mg total) by mouth daily.  90 capsule  3  . simvastatin (ZOCOR) 40 MG tablet Take 1 tablet (40 mg total) by mouth at bedtime.  90 tablet  3  . [DISCONTINUED] Multiple Vitamins-Minerals (ICAPS) CAPS Take by mouth. daily      . [DISCONTINUED] tiotropium (SPIRIVA HANDIHALER) 18 MCG inhalation capsule Place 1 capsule (18 mcg total) into inhaler and inhale daily.  30 capsule  0  . mycophenolate (CELLCEPT) 500 MG tablet Take 1 tablet (500 mg total) by mouth 2 (two) times daily.  60 tablet  6  . predniSONE (DELTASONE) 10 MG tablet Take 4 for 5  days 3 for 5  days 2 for 5  days  Then 1 a day  60 tablet  6  . sulfamethoxazole-trimethoprim (BACTRIM DS) 800-160 MG  per tablet One tablet three times weekly  30 tablet  6

## 2012-04-29 ENCOUNTER — Telehealth: Payer: Self-pay | Admitting: Critical Care Medicine

## 2012-04-29 ENCOUNTER — Telehealth: Payer: Self-pay | Admitting: *Deleted

## 2012-04-29 ENCOUNTER — Encounter: Payer: Self-pay | Admitting: Critical Care Medicine

## 2012-04-29 NOTE — Telephone Encounter (Signed)
Spoke with pharmacist and clarified dosage instructions for Bactrim are three times weekly.  Pharmacist clarified that they have sent form to our office for prior auth for Cellcept.  Pt notified that Bactrim is now ready for pick up and he will be contacted when Cellcept auth is received.

## 2012-04-29 NOTE — Telephone Encounter (Signed)
Received message from [facility] that mycophenolate (cellcept) is requiring prior authorization. Wants to know status of prior auth or is there an alternative?  # for Medicare Part D [phone].

## 2012-05-02 NOTE — Telephone Encounter (Signed)
I do not see a PA for cellcept.  I called [facility], spoke with Debbie.  She will fax this to triage.  Will await fax.

## 2012-05-02 NOTE — Telephone Encounter (Signed)
Crystal, have you received PA form for this pt's cellcept?  Last phone note mentioned we will contact him once authorization for this was received, but I do not see where we initiated it.  Please advise, thanks

## 2012-05-03 ENCOUNTER — Telehealth: Payer: Self-pay | Admitting: Critical Care Medicine

## 2012-05-03 NOTE — Telephone Encounter (Signed)
[place], [phone],  for PA on Cellcept. DX:  [phone]. Cellcept APPROVED through 05/24/2013. The pharmacy and the pt have both been notified.

## 2012-05-03 NOTE — Telephone Encounter (Signed)
Pharmacy notified of the APPROVAL for Cellcept through 05/24/2013.

## 2012-05-09 ENCOUNTER — Telehealth: Payer: Self-pay | Admitting: Critical Care Medicine

## 2012-05-09 NOTE — Telephone Encounter (Signed)
Per pt his wife is not a pt of ours. i advised him she would need to be a pt of ours. If he wanted her tested then he would need to call her pcp. He voiced her understanding and needed nothing further

## 2012-05-29 ENCOUNTER — Observation Stay (HOSPITAL_COMMUNITY)
Admission: EM | Admit: 2012-05-29 | Discharge: 2012-05-30 | Disposition: A | Discharge status: Home / Self Care | Payer: Medicare Other | Attending: Cardiology | Admitting: Cardiology

## 2012-05-29 ENCOUNTER — Emergency Department (HOSPITAL_COMMUNITY): Payer: Medicare Other

## 2012-05-29 ENCOUNTER — Encounter (HOSPITAL_COMMUNITY): Payer: Self-pay | Admitting: *Deleted

## 2012-05-29 DIAGNOSIS — F419 Anxiety disorder, unspecified: Secondary | ICD-10-CM | POA: Diagnosis present

## 2012-05-29 DIAGNOSIS — R0902 Hypoxemia: Secondary | ICD-10-CM

## 2012-05-29 DIAGNOSIS — F411 Generalized anxiety disorder: Secondary | ICD-10-CM

## 2012-05-29 DIAGNOSIS — F3289 Other specified depressive episodes: Secondary | ICD-10-CM | POA: Insufficient documentation

## 2012-05-29 DIAGNOSIS — E785 Hyperlipidemia, unspecified: Secondary | ICD-10-CM | POA: Insufficient documentation

## 2012-05-29 DIAGNOSIS — I5042 Chronic combined systolic (congestive) and diastolic (congestive) heart failure: Secondary | ICD-10-CM | POA: Diagnosis present

## 2012-05-29 DIAGNOSIS — R000 Tachycardia, unspecified: Secondary | ICD-10-CM

## 2012-05-29 DIAGNOSIS — J4489 Other specified chronic obstructive pulmonary disease: Secondary | ICD-10-CM | POA: Insufficient documentation

## 2012-05-29 DIAGNOSIS — Z950 Presence of cardiac pacemaker: Secondary | ICD-10-CM

## 2012-05-29 DIAGNOSIS — J449 Chronic obstructive pulmonary disease, unspecified: Secondary | ICD-10-CM | POA: Insufficient documentation

## 2012-05-29 DIAGNOSIS — M1990 Unspecified osteoarthritis, unspecified site: Secondary | ICD-10-CM | POA: Insufficient documentation

## 2012-05-29 DIAGNOSIS — I2510 Atherosclerotic heart disease of native coronary artery without angina pectoris: Secondary | ICD-10-CM | POA: Insufficient documentation

## 2012-05-29 DIAGNOSIS — I509 Heart failure, unspecified: Secondary | ICD-10-CM

## 2012-05-29 DIAGNOSIS — I70219 Atherosclerosis of native arteries of extremities with intermittent claudication, unspecified extremity: Secondary | ICD-10-CM

## 2012-05-29 DIAGNOSIS — K219 Gastro-esophageal reflux disease without esophagitis: Secondary | ICD-10-CM | POA: Insufficient documentation

## 2012-05-29 DIAGNOSIS — R5381 Other malaise: Secondary | ICD-10-CM | POA: Insufficient documentation

## 2012-05-29 DIAGNOSIS — F329 Major depressive disorder, single episode, unspecified: Secondary | ICD-10-CM | POA: Insufficient documentation

## 2012-05-29 DIAGNOSIS — I5021 Acute systolic (congestive) heart failure: Principal | ICD-10-CM | POA: Insufficient documentation

## 2012-05-29 HISTORY — DX: Peripheral vascular disease, unspecified: I73.9

## 2012-05-29 HISTORY — DX: Pneumonia, unspecified organism: J18.9

## 2012-05-29 HISTORY — DX: Pulmonary fibrosis, unspecified: J84.10

## 2012-05-29 LAB — CREATININE, SERUM
Creatinine, Ser: 1.14 mg/dL (ref 0.50–1.35)
GFR calc Af Amer: 73 mL/min — ABNORMAL LOW (ref >90)
GFR calc non Af Amer: 63 mL/min — ABNORMAL LOW (ref >90)

## 2012-05-29 LAB — COMPREHENSIVE METABOLIC PANEL
CO2: 24 mEq/L (ref 19–32)
Calcium: 9 mg/dL (ref 8.4–10.5)
Chloride: 101 mEq/L (ref 96–112)
Creatinine, Ser: 1.1 mg/dL (ref 0.50–1.35)
GFR calc Af Amer: 77 mL/min — ABNORMAL LOW (ref >90)
GFR calc non Af Amer: 66 mL/min — ABNORMAL LOW (ref >90)
Glucose, Bld: 118 mg/dL — ABNORMAL HIGH (ref 70–99)
Total Bilirubin: 0.7 mg/dL (ref 0.3–1.2)

## 2012-05-29 LAB — CBC WITH DIFFERENTIAL/PLATELET
Eosinophils Relative: 3 % (ref 0–5)
HCT: 44.5 % (ref 39.0–52.0)
Hemoglobin: 14.9 g/dL (ref 13.0–17.0)
Lymphocytes Relative: 8 % — ABNORMAL LOW (ref 12–46)
Lymphs Abs: 0.8 10*3/uL (ref 0.7–4.0)
MCV: 92.9 fL (ref 78.0–100.0)
Monocytes Absolute: 0.8 10*3/uL (ref 0.1–1.0)
Monocytes Relative: 9 % (ref 3–12)
Neutro Abs: 7.3 10*3/uL (ref 1.7–7.7)
RBC: 4.79 MIL/uL (ref 4.22–5.81)
WBC: 9.1 10*3/uL (ref 4.0–10.5)

## 2012-05-29 LAB — TROPONIN I: Troponin I: <0.3 ng/mL (ref <0.30)

## 2012-05-29 LAB — CBC
Hemoglobin: 15.3 g/dL (ref 13.0–17.0)
RBC: 4.9 MIL/uL (ref 4.22–5.81)

## 2012-05-29 LAB — PRO B NATRIURETIC PEPTIDE: Pro B Natriuretic peptide (BNP): 1424 pg/mL — ABNORMAL HIGH (ref 0–125)

## 2012-05-29 MED ORDER — SODIUM CHLORIDE 0.9 % IJ SOLN: 3.0000 mL | INTRAMUSCULAR | Status: DC | PRN

## 2012-05-29 MED ORDER — FUROSEMIDE 10 MG/ML IJ SOLN
40.0000 mg | Freq: Once | INTRAMUSCULAR | Status: AC
  Administered 2012-05-29: 40 mg via INTRAVENOUS
  Filled 2012-05-29: qty 4

## 2012-05-29 MED ORDER — ACETAMINOPHEN 325 MG PO TABS: 650.0000 mg | ORAL_TABLET | ORAL | Status: DC | PRN

## 2012-05-29 MED ORDER — HEPARIN SODIUM (PORCINE) 5000 UNIT/ML IJ SOLN
5000.0000 [IU] | Freq: Three times a day (TID) | INTRAMUSCULAR | Status: DC
  Administered 2012-05-29 – 2012-05-30 (×2): 5000 [IU] via SUBCUTANEOUS
  Filled 2012-05-29 (×5): qty 1

## 2012-05-29 MED ORDER — PREDNISONE 10 MG PO TABS
10.0000 mg | ORAL_TABLET | Freq: Every day | ORAL | Status: DC
  Administered 2012-05-30: 10 mg via ORAL
  Filled 2012-05-29 (×2): qty 1

## 2012-05-29 MED ORDER — POTASSIUM CHLORIDE CRYS ER 20 MEQ PO TBCR
40.0000 meq | EXTENDED_RELEASE_TABLET | Freq: Once | ORAL | Status: AC
  Administered 2012-05-29: 40 meq via ORAL
  Filled 2012-05-29: qty 2

## 2012-05-29 MED ORDER — ONE-DAILY MULTI VITAMINS PO TABS
1.0000 | ORAL_TABLET | Freq: Every day | ORAL | Status: DC
Start: 2012-05-29 — End: 2012-05-29

## 2012-05-29 MED ORDER — ZOLPIDEM TARTRATE 5 MG PO TABS
5.0000 mg | ORAL_TABLET | Freq: Every evening | ORAL | Status: DC | PRN
  Administered 2012-05-29: 5 mg via ORAL
  Filled 2012-05-29: qty 1

## 2012-05-29 MED ORDER — MYCOPHENOLATE MOFETIL 250 MG PO CAPS
500.0000 mg | ORAL_CAPSULE | Freq: Two times a day (BID) | ORAL | Status: DC
  Administered 2012-05-29 – 2012-05-30 (×2): 500 mg via ORAL
  Filled 2012-05-29 (×3): qty 2

## 2012-05-29 MED ORDER — ALPRAZOLAM 0.25 MG PO TABS: 0.2500 mg | ORAL_TABLET | Freq: Every day | ORAL | Status: DC | PRN

## 2012-05-29 MED ORDER — CITALOPRAM HYDROBROMIDE 20 MG PO TABS
20.0000 mg | ORAL_TABLET | Freq: Every day | ORAL | Status: DC
  Administered 2012-05-29 – 2012-05-30 (×2): 20 mg via ORAL
  Filled 2012-05-29 (×2): qty 1

## 2012-05-29 MED ORDER — LISINOPRIL 10 MG PO TABS
10.0000 mg | ORAL_TABLET | Freq: Every day | ORAL | Status: DC
  Administered 2012-05-29: 10 mg via ORAL
  Filled 2012-05-29 (×2): qty 1

## 2012-05-29 MED ORDER — SODIUM CHLORIDE 0.9 % IJ SOLN
3.0000 mL | Freq: Two times a day (BID) | INTRAMUSCULAR | Status: DC
  Administered 2012-05-29 – 2012-05-30 (×2): 3 mL via INTRAVENOUS

## 2012-05-29 MED ORDER — SIMVASTATIN 40 MG PO TABS
40.0000 mg | ORAL_TABLET | Freq: Every day | ORAL | Status: DC
Start: 2012-05-29 — End: 2012-05-30
  Administered 2012-05-29: 40 mg via ORAL
  Filled 2012-05-29 (×2): qty 1

## 2012-05-29 MED ORDER — ONDANSETRON HCL 4 MG/2ML IJ SOLN: 4.0000 mg | Freq: Four times a day (QID) | INTRAMUSCULAR | Status: DC | PRN

## 2012-05-29 MED ORDER — ASPIRIN EC 81 MG PO TBEC
81.0000 mg | DELAYED_RELEASE_TABLET | Freq: Every day | ORAL | Status: DC
  Administered 2012-05-29 – 2012-05-30 (×2): 81 mg via ORAL
  Filled 2012-05-29 (×2): qty 1

## 2012-05-29 MED ORDER — PANTOPRAZOLE SODIUM 40 MG PO TBEC
40.0000 mg | DELAYED_RELEASE_TABLET | Freq: Every day | ORAL | Status: DC
  Administered 2012-05-29 – 2012-05-30 (×2): 40 mg via ORAL
  Filled 2012-05-29 (×2): qty 1

## 2012-05-29 MED ORDER — NITROGLYCERIN 0.4 MG SL SUBL: 0.4000 mg | SUBLINGUAL_TABLET | SUBLINGUAL | Status: DC | PRN

## 2012-05-29 MED ORDER — MYCOPHENOLATE MOFETIL 500 MG PO TABS
500.0000 mg | ORAL_TABLET | Freq: Two times a day (BID) | ORAL | Status: DC
  Filled 2012-05-29: qty 1

## 2012-05-29 MED ORDER — ADULT MULTIVITAMIN W/MINERALS CH
1.0000 | ORAL_TABLET | Freq: Every day | ORAL | Status: DC
  Administered 2012-05-30: 1 via ORAL
  Filled 2012-05-29: qty 1

## 2012-05-29 MED ORDER — AMLODIPINE BESYLATE 5 MG PO TABS
5.0000 mg | ORAL_TABLET | Freq: Every day | ORAL | Status: DC
  Administered 2012-05-29: 5 mg via ORAL
  Filled 2012-05-29 (×2): qty 1

## 2012-05-29 MED ORDER — SODIUM CHLORIDE 0.9 % IV SOLN: 250.0000 mL | INTRAVENOUS | Status: DC | PRN

## 2012-05-29 NOTE — ED Notes (Signed)
Patient wife is at bedside, reports patient is being treated for high levels of mold in his respiratory tract.  Patient is on sulfa prednisone and one other med to treat this.  Patient was recently treating his basement for mold

## 2012-05-29 NOTE — ED Notes (Signed)
Patient interrogation failed,  Will repeat.  Spoke with Berna Spare,  ERMD aware of same.  No s/sx of distress

## 2012-05-29 NOTE — ED Notes (Signed)
Repeat interrogation of pacemaker has been completed.  Awaiting transmission and report.

## 2012-05-29 NOTE — ED Notes (Signed)
Patient has been provided with drink and meal tray has been ordered.

## 2012-05-29 NOTE — ED Notes (Signed)
[facility] has returned call,  A rep to come in and assess the pacemaker due to abnormalities noted on the reading.  Family aware and at bedside.  ERMD also aware

## 2012-05-29 NOTE — ED Notes (Signed)
Wayne Torres cardiology at bedside.  [facility] report provided to pa to review.  Family remains at bedside.  Patient denies any complaints.  He fatigues easily with activity/standing to urinate.

## 2012-05-29 NOTE — ED Notes (Signed)
Patient reported to wake up feeling hot/not right.  He thought it was panic/anxiety.  Patient took xanax and went outside to cool off.  Patient states he still didn't feel good.  Checked bp and noted low bp and elevated heart rate of 100.  Patient called ems.   Patient is paced on the monitor.  Patient rate is set at 70.  Patient denies chest pain.  Patient denies sob.  Patient pulse ox 89 percent.  Patient placed on oxygen by ems.  Patient pulse ox improved to 94percent with 4liters oxygen.  Patient with shallow/rapid resp during transport but denied feeling sob.  Patient with no prior hx of lung disease.  Patient is seen by paula ross.

## 2012-05-29 NOTE — ED Provider Notes (Signed)
History     CSN: [id number]  Arrival date & time 05/29/12  1004   First MD Initiated Contact with Patient 05/29/12 1006      Chief Complaint  Patient presents with  . Palpitations    (Consider location/radiation/quality/duration/timing/severity/associated sxs/prior treatment) HPI Comments: Patient with history of CAD, s/p CABG in 1993, also pacer placed in April of 2013.  Walked the dog this morning.  When he returned he wasn't feeling well, then noticed his heart rate was elevated.  He denies any chest pain but does feel somewhat short of breath.  There are no fevers, chills, or cough.  He was found by ems to have oxygen saturations in the upper 80's and was placed on oxygen, then transported here uneventfully.  Patient is a 72 y.o. male presenting with palpitations. The history is provided by the patient.  Palpitations  This is a new problem. Episode onset: this morning. The problem occurs constantly. The problem has been resolved. Associated symptoms include shortness of breath. Pertinent negatives include no diaphoresis, no fever and no cough. He has tried nothing for the symptoms.    Past Medical History  Diagnosis Date  . GI bleed   . Nausea and vomiting   . Abrasion of knee, left   . Shoulder joint pain   . Coronary artery disease s/p CABG 1990s     cath 2009 Nl LV fn, T SVG>>RCA  . Back pain   . Hip pain, right   . PSA (psoriatic arthritis)     increased  . Lumbar disc disease   . Malignant neoplasm of prostate   . Fatigue   . COPD (chronic obstructive pulmonary disease)   . Renal calculus   . Osteoarthritis   . HTN (hypertension)   . Depression   . Anxiety   . ED (erectile dysfunction)   . Hyperlipidemia   . GERD (gastroesophageal reflux disease)   . Allergic rhinitis   . Peptic stricture of esophagus   . Pacemaker  [facility]     DOI 08/2011  . Complete heart block 09/12/11    Pacer inserted by Dr. Caryl Comes  . Acute bronchitis 02/07/2012    Past Surgical  History  Procedure Date  . Coronary angioplasty 06/03/2007, 01/25/2009  . Nasal septum surgery april 2012    dr britt, [facility] - [place]  . Coronary artery bypass graft 20 yrs ago    LIMA to LAD, SVG to PDA, SVG to Ramus, SVG to D  . Pacemaker placement 08/2011    Family History  Problem Relation Age of Onset  . Heart attack Father   . Heart disease Father   . Colon cancer Neg Hx   . Cancer Neg Hx     Colon    History  Substance Use Topics  . Smoking status: Former Smoker -- 1.5 packs/day for 42 years    Types: Cigarettes    Quit date: 09/11/2001  . Smokeless tobacco: Never Used     Comment: Pt does not get regular exercise  . Alcohol Use: No      Review of Systems  Constitutional: Negative for fever and diaphoresis.  Respiratory: Positive for shortness of breath. Negative for cough.   Cardiovascular: Positive for palpitations.  All other systems reviewed and are negative.    Allergies  Review of patient's allergies indicates no known allergies.  Home Medications   Current Outpatient Rx  Name  Route  Sig  Dispense  Refill  . ALBUTEROL SULFATE HFA 108 (  90 BASE) MCG/ACT IN AERS   Inhalation   Inhale 2 puffs into the lungs every 6 (six) hours as needed for wheezing or shortness of breath.   1 Inhaler   0   . ALPRAZOLAM 0.25 MG PO TABS   Oral   Take 1 tablet (0.25 mg total) by mouth daily as needed. anxiety   30 tablet   5   . AMLODIPINE BESYLATE 5 MG PO TABS   Oral   Take 1 tablet (5 mg total) by mouth daily.   90 tablet   3   . ASPIRIN EC 325 MG PO TBEC   Oral   Take 325 mg by mouth daily.         Marland Kitchen CALCIUM 1200 PO   Oral   Take by mouth. 2 daily         . VITAMIN D-3 PO   Oral   Take by mouth. 1 tab ndaily         . CITALOPRAM HYDROBROMIDE 20 MG PO TABS   Oral   Take 1 tablet (20 mg total) by mouth daily.   90 tablet   3   . OSTEO BI-FLEX REGULAR STRENGTH PO   Oral   Take by mouth. daily         . LISINOPRIL 10 MG PO TABS   Oral    Take 1 tablet (10 mg total) by mouth daily.   90 tablet   3   . ONE-DAILY MULTI VITAMINS PO TABS   Oral   Take 1 tablet by mouth daily.         Marland Kitchen MYCOPHENOLATE MOFETIL 500 MG PO TABS   Oral   Take 1 tablet (500 mg total) by mouth 2 (two) times daily.   60 tablet   6   . NON FORMULARY      Apple cider vinegar         . FISH OIL 1200 MG PO CAPS   Oral   Take 1 capsule by mouth daily.         Marland Kitchen OMEPRAZOLE 40 MG PO CPDR   Oral   Take 1 capsule (40 mg total) by mouth daily.   90 capsule   3   . PREDNISONE 10 MG PO TABS      Take 4 for 5  days 3 for 5  days 2 for 5  days  Then 1 a day   60 tablet   6   . SIMVASTATIN 40 MG PO TABS   Oral   Take 1 tablet (40 mg total) by mouth at bedtime.   90 tablet   3   . SULFAMETHOXAZOLE-TMP DS 800-160 MG PO TABS      One tablet three times weekly   30 tablet   6     BP 113/72  Temp 97.9 F (36.6 C) (Oral)  Resp 22  Ht 6' (1.829 m)  Wt 230 lb (104.327 kg)  BMI 31.19 kg/m2  SpO2 97%  Physical Exam  Nursing note and vitals reviewed. Constitutional: He is oriented to person, place, and time. He appears well-developed and well-nourished. No distress.  HENT:  Head: Normocephalic and atraumatic.  Mouth/Throat: Oropharynx is clear and moist.  Neck: Normal range of motion. Neck supple.  Cardiovascular: Normal rate and regular rhythm.   No murmur heard. Pulmonary/Chest: Effort normal and breath sounds normal. No respiratory distress. He has no wheezes.  Abdominal: Soft. Bowel sounds are normal. He exhibits no distension. There is  no tenderness.  Musculoskeletal: Normal range of motion. He exhibits no edema.  Lymphadenopathy:    He has no cervical adenopathy.  Neurological: He is alert and oriented to person, place, and time. No cranial nerve deficit.  Skin: Skin is warm and dry. He is not diaphoretic.    ED Course  Procedures (including critical care time)  Labs Reviewed - No data to display No results  found.   No diagnosis found.   Date: 05/29/2012  Rate: 96  Rhythm: Ventricular Paced  QRS Axis: indeterminate  Intervals: Indeterminate  ST/T Wave abnormalities: indeterminate  Conduction Disutrbances:none  Narrative Interpretation:   Old EKG Reviewed: unchanged    MDM  The patient presents here with shortness of breath and palpitations.  He has a history of pacemaker due to heart block and also and echo that shows reduced ef.  There is no history of chf.  The workup today reveals elevated bnp with apparent chf on the chest xray.  He also felt a rapid heart rate so we are working to interrogate the pacer.  He was given lasix but remains hypoxic.  I have consulted Cardiology for evaluation.  He will be admitted to their service.          Veryl Speak, MD 05/29/12 1501

## 2012-05-29 NOTE — ED Notes (Signed)
Patient resting.  No complaints.  Report called to [facility] on [facility].  Patient belongings sent home with patient with exception of clothing and glasses.  No jewelry and no valuables

## 2012-05-29 NOTE — H&P (Signed)
Patient ID: Wayne Torres MRN: [id number], DOB/AGE: 1941/05/04   Admit date: 05/29/2012   Primary Physician: Cathlean Cower, MD Primary Cardiologist: Lizbeth Bark, MD/S. Caryl Comes, MD  Pt. Profile:  72 y/o male with h/o CAD who presented to ED this AM after episode of anxiety, malaise, wkns, and found to have mild CHF on cxr.  Problem List  Past Medical History  Diagnosis Date  . GI bleed   . Nausea and vomiting   . Abrasion of knee, left   . Shoulder joint pain   . Coronary artery disease s/p CABG 1990s     a. approx 1994 s/p CABG x 4 (LIMA->LAD, VG->RCA, VG->RI, VG->Diag);  b. 11/2008 Cath: LM 100, LAD small, RCA 50-60p, 100d, VG->RCA 100 ost, VG->RI 71m VG->Diag 40 - upper branch of diag small, 99%, LIMA-LAD patent.;  c. 08/2011 Echo: EF 45-50%, mod dil LA,/RA/RV with mild-mod reduced RV fxn.  . Back pain   . Hip pain, right   . PSA (psoriatic arthritis)     increased  . Lumbar disc disease   . Malignant neoplasm of prostate   . Fatigue   . Pulmonary fibrosis, postinflammatory     a. started on prednisone and cellcept 04/28/2012  . Renal calculus   . Osteoarthritis   . HTN (hypertension)   . Depression   . Anxiety   . ED (erectile dysfunction)   . Hyperlipidemia   . GERD (gastroesophageal reflux disease)   . Allergic rhinitis   . Peptic stricture of esophagus   . Complete heart block     a. s/p SJM [facility] dc ppm, ser # [id number] . Acute bronchitis 02/07/2012  . PVD (peripheral vascular disease)     a. 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  bilat ext iliac dzs.    Past Surgical History  Procedure Date  . Coronary angioplasty 06/03/2007, 01/25/2009  . Nasal septum surgery april 2012    dr britt, [facility] [place]  . Coronary artery bypass graft 20 yrs ago    LIMA to LAD, SVG to PDA, SVG to Ramus, SVG to D  . Pacemaker placement 08/2011    Allergies  No Known Allergies  HPI  72y/o male with the above complex problem list.  He has been  followed closely by Dr. 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  For this, he has been on cellcept, prednisone, and 3x weekly bactrim.  He has had no recent chest pain and dyspnea has been improving.  This AM, pt was sitting @ home and he developed what he thought was another panic attack.  He has had these in the past, though not recently.  During this episode, he felt very anxious along with "pins and needles" along his upper scalp/crown.  This was also associated with malaise, wkns, lightheadedness, and mild diaphoresis.  He checked his BP, which was nl, and his HR read out as 117, which is much higher than he is used to seeing.  He did not have any chest pain, sob, or tachy-palpitations during this episode.  He took a xanax and symptoms started to ease off.  His wife called EMS and he was taken to the ED.  Here, he is a sensed, v paced on tele.  Pro bnp is mildly elevated and cxr suggests mild edema.  He has not had any dyspnea.  His PPM was interrogated and showed elevated atrial rates this AM, presumably during this  event, climbing into the 160's, with ventricular rates only in the low 100's.  Review of device strips shows a regular atrial rhythm during tachycardia.  We have been asked to eval.  He is currently w/o complaints.  Home Medications  Prior to Admission medications   Medication Sig Start Date End Date Taking? Authorizing Provider  ALPRAZolam (XANAX) 0.25 MG tablet Take 1 tablet (0.25 mg total) by mouth daily as needed. anxiety 04/08/12  Yes Biagio Borg, MD  amLODipine (NORVASC) 5 MG tablet Take 1 tablet (5 mg total) by mouth daily. 08/03/11  Yes Biagio Borg, MD  aspirin EC 81 MG tablet Take 81 mg by mouth daily.   Yes Historical Provider, MD  Calcium Carbonate-Vit D-Min (CALCIUM 1200 PO) Take by mouth. 2 daily   Yes Historical Provider, MD  Cholecalciferol (VITAMIN D-3 PO) Take by mouth. 1 tab ndaily   Yes Historical Provider, MD  citalopram (CELEXA) 20  MG tablet Take 1 tablet (20 mg total) by mouth daily. 08/03/11  Yes Biagio Borg, MD  Glucosamine-Chondroitin (OSTEO BI-FLEX REGULAR STRENGTH PO) Take by mouth. daily   Yes Historical Provider, MD  lisinopril (PRINIVIL,ZESTRIL) 10 MG tablet Take 1 tablet (10 mg total) by mouth daily. 08/03/11  Yes Biagio Borg, MD  Multiple Vitamin (MULTIVITAMIN) tablet Take 1 tablet by mouth daily.   Yes Historical Provider, MD  mycophenolate (CELLCEPT) 500 MG tablet Take 1 tablet (500 mg total) by mouth 2 (two) times daily. 04/28/12  Yes Elsie Stain, MD  nitroGLYCERIN (NITROSTAT) 0.4 MG SL tablet Place 0.4 mg under the tongue every 5 (five) minutes as needed. For chest pain   Yes Historical Provider, MD  Omega-3 Fatty Acids (FISH OIL) 1200 MG CAPS Take 1 capsule by mouth daily.   Yes Historical Provider, MD  omeprazole (PRILOSEC) 40 MG capsule Take 1 capsule (40 mg total) by mouth daily. 08/03/11  Yes Biagio Borg, MD  predniSONE (DELTASONE) 10 MG tablet Take 4 for 5  days 3 for 5  days 2 for 5  days  Then 1 a day 04/28/12  Yes Elsie Stain, MD  simvastatin (ZOCOR) 40 MG tablet Take 1 tablet (40 mg total) by mouth at bedtime. 08/03/11  Yes Biagio Borg, MD  sulfamethoxazole-trimethoprim (BACTRIM DS) 800-160 MG per tablet One tablet three times weekly 04/28/12  Yes Elsie Stain, MD   Family History  Family History  Problem Relation Age of Onset  . Heart attack Father   . Heart disease Father   . Colon cancer Neg Hx   . Cancer Neg Hx     Colon   Social History  History   Social History  . Marital Status: Married    Spouse Name: N/A    Number of Children: 2  . Years of Education: N/A   Occupational History  . Retired    Social History Main Topics  . Smoking status: Former Smoker -- 1.5 packs/day for 42 years    Types: Cigarettes    Quit date: 09/11/2001  . Smokeless tobacco: Never Used     Comment: Pt does not get regular exercise  . Alcohol Use: No  . Drug Use: No  . Sexually Active:  Not on file   Other Topics Concern  . 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    Review of Systems General:  Anxiety  this AM with associated malaise and diaphoresis.  No chills, fever, night sweats or weight changes.  Cardiovascular:  No chest pain, dyspnea on exertion, edema, orthopnea, palpitations, paroxysmal nocturnal dyspnea. Dermatological: No rash, lesions/masses Respiratory: No cough, dyspnea Urologic: No hematuria, dysuria Abdominal:   No nausea, vomiting, diarrhea, bright red blood per rectum, melena, or hematemesis Neurologic:  No visual changes, +++wkns and lightheadedness, changes in mental status. All other systems reviewed and are otherwise negative except as noted above.  Physical Exam  Blood pressure 112/75, pulse 92, temperature 97.9 F (36.6 C), temperature source Oral, resp. rate 20, height 6' (1.829 m), weight 230 lb (104.327 kg), SpO2 93.00%.  General: Pleasant, NAD Psych: Normal affect. Neuro: Alert and oriented X 3. Moves all extremities spontaneously. HEENT: Normal  Neck: Supple without bruits or JVD. Lungs:  Resp regular and unlabored, diminished breath sounds but CTA. Heart: RRR no s3, s4, or murmurs. Abdomen: Soft, non-tender, non-distended, BS + x 4.  Extremities: No clubbing, cyanosis or edema. DP/PT/Radials 2+ and equal bilaterally.  Labs   Basename 05/29/12 1031  CKTOTAL --  CKMB --  TROPONINI <0.30   Lab Results  Component Value Date   WBC 9.1 05/29/2012   HGB 14.9 05/29/2012   HCT 44.5 05/29/2012   MCV 92.9 05/29/2012   PLT 181 05/29/2012     Lab 05/29/12 1027  NA 137  K 3.5  CL 101  CO2 24  BUN 16  CREATININE 1.10  CALCIUM 9.0  PROT 6.5  BILITOT 0.7  ALKPHOS 71  ALT 26  AST 18  GLUCOSE 118*   Lab Results  Component Value Date   CHOL 110 04/08/2012   HDL 28.20* 04/08/2012   LDLCALC 55 04/08/2012   TRIG 134.0  04/08/2012    Radiology/Studies  Dg Chest Port 1 View  05/29/2012  *RADIOLOGY REPORT*  Clinical Data: Generalized weakness.  Palpitations.  Fever.  Prior CABG.  Indwelling pacemaker.  PORTABLE CHEST - 1 VIEW 05/29/2012 1047 hours:  Comparison: Portable chest x-ray 02/08/2012 [facility] and 09/15/2011 [facility].  Findings: Prior sternotomy for CABG.  Cardiac silhouette mildly enlarged but stable.  Pulmonary venous hypertension and mild diffuse interstitial pulmonary edema.  This is superimposed upon chronic scar/interstitial fibrosis in the lung bases.  No visible pleural effusions.  Left subclavian dual lead transvenous pacemaker unchanged.  IMPRESSION: Mild CHF suspected, superimposed upon chronic interstitial fibrosis in the lung bases.   Original Report Authenticated By: Evangeline Dakin, M.D.    ECG  A sense, v pace, 92.  ASSESSMENT AND PLAN  1.  Acute CHF - possibly mixed syst/diastolic:  Pt with mild edema on cxr and elevated pBNP.  Last EF was 45-50% in 08/2011.  On exam, lungs are clear and his neck veins appear flat.  We will give him one dose of IV lasix here in the ED and observe overnight.  Repeat 2D echo to eval LV fxn.  Cont acei therapy.  2.  Tachycardia:  Device interrogation showed elevated atrial rates during episode this AM with prior elevations in atrial rates in Dec and Nov as well.  Review of strips shows that atrial rhythm was regular thus question sinus tach, atrial tach, or atrial flutter with poor ventricular conduction r/t h/o high grade heart block, thus explaining slower ventricular rates.  Will observe on tele.  Check TSH.  Supp K and check Mg.  3.  Post-inflammatory pulmonary fibrosis:  Cont home meds.  He has f/u with Dr. Joya Gaskins on 1/16.  4.  ? Anxiety attack:  Cont home meds.  5.  HTN:  Stable, cont acei.  6.  CAD:  No chest pain during episode.  Initial troponin is nl.  Will cycle.  Cont home meds.  7.  HL:  Cont statin.  LDL 55  03/2012.  Signed, Murray Hodgkins, NP 05/29/2012, 2:48 PM  History and all data above reviewed.  Patient examined.  I agree with the findings as above. He presents with atypical symptoms consistent with previous anxiety attacks. He did have tachycardia with an atrial rate of 150 on interrogation. He was apparently hypotensive with mild decreased O2 sats compared with previous.  He denies any chest pain. In the ER he was noted to have mild CHF on CXR with mild proBNP without baseline for comparison.   The patient exam reveals COR:RRR  ,  Lungs: Clear  ,  Abd: Positive bowel sounds, no rebound no guarding, Ext No edema  .  All available labs, radiology testing, previous records reviewed. Agree with documented assessment and plan. Observe overnight. Give IV Lasix tonight.  However,I doubt that he will need further in patient treatment.  He is not on Lasix at home.  However, I think that his mild volume overload and LVEF reduction can be treated with salt restriction to begin with.    Storm Evee Liska  4:40 PM  05/29/2012

## 2012-05-29 NOTE — ED Notes (Signed)
Patient is resting.  No s/sx of acute distress.  No s/sx of sob.  Patient remains on monitor.  Interrogation of pacemaker completed and awaiting on report from [facility]

## 2012-05-30 DIAGNOSIS — F419 Anxiety disorder, unspecified: Secondary | ICD-10-CM | POA: Diagnosis present

## 2012-05-30 DIAGNOSIS — I2510 Atherosclerotic heart disease of native coronary artery without angina pectoris: Secondary | ICD-10-CM | POA: Diagnosis present

## 2012-05-30 DIAGNOSIS — I5042 Chronic combined systolic (congestive) and diastolic (congestive) heart failure: Secondary | ICD-10-CM | POA: Diagnosis present

## 2012-05-30 DIAGNOSIS — I379 Nonrheumatic pulmonary valve disorder, unspecified: Secondary | ICD-10-CM

## 2012-05-30 DIAGNOSIS — I70219 Atherosclerosis of native arteries of extremities with intermittent claudication, unspecified extremity: Secondary | ICD-10-CM

## 2012-05-30 DIAGNOSIS — Z950 Presence of cardiac pacemaker: Secondary | ICD-10-CM

## 2012-05-30 LAB — BASIC METABOLIC PANEL
GFR calc Af Amer: 49 mL/min — ABNORMAL LOW (ref >90)
GFR calc non Af Amer: 42 mL/min — ABNORMAL LOW (ref >90)
Potassium: 4.5 mEq/L (ref 3.5–5.1)
Sodium: 134 mEq/L — ABNORMAL LOW (ref 135–145)

## 2012-05-30 LAB — TROPONIN I: Troponin I: <0.3 ng/mL (ref <0.30)

## 2012-05-30 MED ORDER — LIVING BETTER WITH HEART FAILURE BOOK
Freq: Once | Status: AC
  Administered 2012-05-30: 08:00:00
  Filled 2012-05-30: qty 1

## 2012-05-30 MED ORDER — LISINOPRIL 10 MG PO TABS: 10.0000 mg | ORAL_TABLET | Freq: Every day | ORAL | Status: DC

## 2012-05-30 NOTE — Progress Notes (Signed)
Discharge instructions and CHF education reviewed and given to patient and wife.  Patient in no active distress. Patient's wife providing transport home.

## 2012-05-30 NOTE — Progress Notes (Signed)
  Echocardiogram 2D Echocardiogram has been performed.  Ardelle Balls A 05/30/2012, 11:27 AM

## 2012-05-30 NOTE — Progress Notes (Signed)
Subjective: No CP or SOB  Feels good. Objective: Filed Vitals:   05/29/12 1700 05/29/12 1825 05/29/12 2048 05/30/12 0500  BP: 103/51 121/71 117/72 104/69  Pulse: 90  88 79  Temp:  98.1 F (36.7 C) 98.6 F (37 C) 98.1 F (36.7 C)  TempSrc:  Oral    Resp: _0 Height:  6' (1.829 m)    Weight:  226 lb 3.1 oz (102.6 kg)  224 lb (101.606 kg)  SpO2: 92% 94% 96% 91%   Weight change:   Intake/Output Summary (Last 24 hours) at 05/30/12 0843 Last data filed at 05/29/12 2300  Gross per 24 hour  Intake    480 ml  Output   1050 ml  Net   -570 ml    General: Alert, awake, oriented x3, in no acute distress Neck:  JVP is normal Heart: Regular rate and rhythm, without murmurs, rubs, gallops.  Lungs: Clear to auscultation.  No rales or wheezes. Exemities:  No edema.   Neuro: Grossly intact, nonfocal.  Tele:  SR  SHort burst ST Lab Results: Results for orders placed during the hospital encounter of 05/29/12 (from the past 24 hour(s))  CBC WITH DIFFERENTIAL     Status: Abnormal   Collection Time   05/29/12 10:27 AM      Component Value Range   WBC 9.1  4.0 - 10.5 K/uL   RBC 4.79  4.22 - 5.81 MIL/uL   Hemoglobin 14.9  13.0 - 17.0 g/dL   HCT 44.5  39.0 - 52.0 %   MCV 92.9  78.0 - 100.0 fL   MCH 31.1  26.0 - 34.0 pg   MCHC 33.5  30.0 - 36.0 g/dL   RDW 13.9  11.5 - 15.5 %   Platelets 181  150 - 400 K/uL   Neutrophils Relative 80 (*) 43 - 77 %   Neutro Abs 7.3  1.7 - 7.7 K/uL   Lymphocytes Relative 8 (*) 12 - 46 %   Lymphs Abs 0.8  0.7 - 4.0 K/uL   Monocytes Relative 9  3 - 12 %   Monocytes Absolute 0.8  0.1 - 1.0 K/uL   Eosinophils Relative 3  0 - 5 %   Eosinophils Absolute 0.3  0.0 - 0.7 K/uL   Basophils Relative 0  0 - 1 %   Basophils Absolute 0.0  0.0 - 0.1 K/uL  COMPREHENSIVE METABOLIC PANEL     Status: Abnormal   Collection Time   05/29/12 10:27 AM      Component Value Range   Sodium 137  135 - 145 mEq/L   Potassium 3.5  3.5 - 5.1 mEq/L   Chloride 101  96 - 112 mEq/L     CO2 24  19 - 32 mEq/L   Glucose, Bld 118 (*) 70 - 99 mg/dL   BUN 16  6 - 23 mg/dL   Creatinine, Ser 1.10  0.50 - 1.35 mg/dL   Calcium 9.0  8.4 - 10.5 mg/dL   Total Protein 6.5  6.0 - 8.3 g/dL   Albumin 3.2 (*) 3.5 - 5.2 g/dL   AST 18  0 - 37 U/L   ALT 26  0 - 53 U/L   Alkaline Phosphatase 71  39 - 117 U/L   Total Bilirubin 0.7  0.3 - 1.2 mg/dL   GFR calc non Af Amer 66 (*) >90 mL/min   GFR calc Af Amer 77 (*) >90 mL/min  TROPONIN I     Status:  Normal   Collection Time   05/29/12 10:31 AM      Component Value Range   Troponin I <0.30  <0.30 ng/mL  PRO B NATRIURETIC PEPTIDE     Status: Abnormal   Collection Time   05/29/12 10:31 AM      Component Value Range   Pro B Natriuretic peptide (BNP) 1424.0 (*) 0 - 125 pg/mL  TROPONIN I     Status: Normal   Collection Time   05/29/12  7:01 PM      Component Value Range   Troponin I <0.30  <0.30 ng/mL  CBC     Status: Normal   Collection Time   05/29/12  7:01 PM      Component Value Range   WBC 8.4  4.0 - 10.5 K/uL   RBC 4.90  4.22 - 5.81 MIL/uL   Hemoglobin 15.3  13.0 - 17.0 g/dL   HCT 44.9  39.0 - 52.0 %   MCV 91.6  78.0 - 100.0 fL   MCH 31.2  26.0 - 34.0 pg   MCHC 34.1  30.0 - 36.0 g/dL   RDW 14.1  11.5 - 15.5 %   Platelets 196  150 - 400 K/uL  CREATININE, SERUM     Status: Abnormal   Collection Time   05/29/12  7:01 PM      Component Value Range   Creatinine, Ser 1.14  0.50 - 1.35 mg/dL   GFR calc non Af Amer 63 (*) >90 mL/min   GFR calc Af Amer 73 (*) >90 mL/min  TROPONIN I     Status: Normal   Collection Time   05/30/12 12:32 AM      Component Value Range   Troponin I <0.30  <0.30 ng/mL  TROPONIN I     Status: Normal   Collection Time   05/30/12  7:40 AM      Component Value Range   Troponin I <0.30  <0.30 ng/mL  BASIC METABOLIC PANEL     Status: Abnormal   Collection Time   05/30/12  7:40 AM      Component Value Range   Sodium 134 (*) 135 - 145 mEq/L   Potassium 4.5  3.5 - 5.1 mEq/L   Chloride 96  96 - 112 mEq/L   CO2  27  19 - 32 mEq/L   Glucose, Bld 122 (*) 70 - 99 mg/dL   BUN 26 (*) 6 - 23 mg/dL   Creatinine, Ser 1.60 (*) 0.50 - 1.35 mg/dL   Calcium 9.6  8.4 - 10.5 mg/dL   GFR calc non Af Amer 42 (*) >90 mL/min   GFR calc Af Amer 49 (*) >90 mL/min    Studies/Results: _0 @  Medications: Reviewed   Patient Active Hospital Problem List:  1.  CHF  MIxed systolic/diastolic Very mild exacerbation.  Now improved.  Patient diuresed with IV lasix yesterday.  Cr bumped mildly  Probably a little dry. Feels good.  I agree with J Hochrein.  WOuld keep on same medical regimen.  Watch salt  I would not send home on a diuretic. Etiology of yesterday's event may be mutifactorial (Panic event leading to tachycardia and diastolic dysfunction in setting of lung dz)   Make sure has f/u with me (add on) in 2 wks.  2.  Tachycarda.   Interrogation of pacer yesterday  SInce being on the floor HR as a whole has been good.  Short burst of HR in 140 range, probable sinus tach  Self  limited.  3.  CAD No symptoms of angina.  4.  Pulmonary fibrosis.  5.  HTN  6.  Panic  Patient has had only a few spells  Follow. D/C today.   LOS: 1 day   Wayne Torres 05/30/2012, 8:43 AM

## 2012-05-30 NOTE — Discharge Summary (Addendum)
CARDIOLOGY DISCHARGE SUMMARY   Patient ID: Wayne Torres MRN: [id number] DOB/AGE: 1940/07/30 72 y.o.  Admit date: 05/29/2012 Discharge date: 05/30/2012  Primary Discharge Diagnosis: *Acute systolic CHF (congestive heart failure), NYHA class 3 Secondary Discharge Diagnosis:   s/p Pacemaker-[employer]  Coronary artery disease s/p CABG 1990s  Anxiety  Procedures: None  Hospital Course: Mr. Huster is a 72 year old male with a history of coronary artery disease. He had an episode of anxiety associated with weakness and general malaise. He came to the emergency room where he was found to have mild CHF on the chest x-ray. He was admitted for further evaluation and treatment.  There was concern for an anginal cause to his symptoms but cardiac enzymes remained negative. His other labs had no critical abnormalities. A lipid profile was reviewed and showed an LDL at goal but his HDL is still low. This can be followed as an outpatient. His device was interrogated and showed a heart rate up to 140 at times. The atrial rate was regular and this is possibly sinus tach. A TSH was within normal limits. He was given a dose of IV Lasix for the volume overload. The next day, his BUN and creatinine were higher and this will be followed as an outpatient. He will not be discharged on a diuretic. There was concern that part of his symptoms originated from anxiety/panic attack. He has not had any further spells and should follow up with primary care for this.  On 05/30/2012, Mr. Gilkeson was seen by Dr. Harrington Challenger. His symptoms had resolved and he was ambulating without chest pain or shortness of breath. She considered him stable for discharge in improved condition, to follow up as an outpatient with a BMET in 2 weeks.  Labs:   Lab Results  Component Value Date   WBC 8.4 05/29/2012   HGB 15.3 05/29/2012   HCT 44.9 05/29/2012   MCV 91.6 05/29/2012   PLT 196 05/29/2012     Lab 05/30/12 0740 05/29/12 1027  NA 134* --  K 4.5 --    CL 96 --  CO2 27 --  BUN 26* --  CREATININE 1.60* --  CALCIUM 9.6 --  PROT -- 6.5  BILITOT -- 0.7  ALKPHOS -- 71  ALT -- 26  AST -- 18  GLUCOSE 122* --    Basename 05/30/12 0740 05/30/12 0032 05/29/12 1901  CKTOTAL -- -- --  CKMB -- -- --  CKMBINDEX -- -- --  TROPONINI <0.30 <0.30 <0.30   Lipid Panel     Component Value Date/Time   CHOL 110 04/08/2012 1043   TRIG 134.0 04/08/2012 1043   HDL 28.20* 04/08/2012 1043   CHOLHDL 4 04/08/2012 1043   VLDL 26.8 04/08/2012 1043   LDLCALC 55 04/08/2012 1043    Pro B Natriuretic peptide (BNP)  Date/Time Value Range Status  05/29/2012 10:31 AM 1424.0* 0 - 125 pg/mL Final      Radiology: Dg Chest Port 1 View 05/29/2012  *RADIOLOGY REPORT*  Clinical Data: Generalized weakness.  Palpitations.  Fever.  Prior CABG.  Indwelling pacemaker.  PORTABLE CHEST - 1 VIEW 05/29/2012 1047 hours:  Comparison: Portable chest x-ray 02/08/2012 [facility] and 09/15/2011 [facility].  Findings: Prior sternotomy for CABG.  Cardiac silhouette mildly enlarged but stable.  Pulmonary venous hypertension and mild diffuse interstitial pulmonary edema.  This is superimposed upon chronic scar/interstitial fibrosis in the lung bases.  No visible pleural effusions.  Left subclavian dual lead transvenous pacemaker unchanged.  IMPRESSION: Mild  CHF suspected, superimposed upon chronic interstitial fibrosis in the lung bases.   Original Report Authenticated By: Evangeline Dakin, M.D.    Echo: 05/30/2012 Study Conclusions - Left ventricle: The cavity size was moderately dilated. Wall thickness was increased in a pattern of moderate LVH. Systolic function was mildly to moderately reduced. The estimated ejection fraction was in the range of 40% to 45%. Diffuse hypokinesis. - Ventricular septum: Septal motion showed paradox. - Left atrium: The atrium was mildly dilated.  EKG:    29-May-2012 10:12:27 [facility]/ED ROUTINE RECORD atrial-sensed  ventricular-paced complexes Premature ventricular complexes Abnormal ECG 16m/s 158mmV _0  8.0.1 12SL 235 CID: [id number] by: Confirmed By: MIWilfrid LundD Vent. rate 96 BPM PR interval 175 ms QRS duration 162 ms QT/QTc 400/505 ms P-R-T axes 45 -78 90  FOLLOW UP PLANS AND APPOINTMENTS No Known Allergies   Medication List     As of 05/30/2012 11:18 AM    STOP taking these medications         albuterol 108 (90 BASE) MCG/ACT inhaler   Commonly known as: PROVENTIL HFA;VENTOLIN HFA      TAKE these medications         ALPRAZolam 0.25 MG tablet   Commonly known as: XANAX   Take 1 tablet (0.25 mg total) by mouth daily as needed. anxiety      amLODipine 5 MG tablet   Commonly known as: NORVASC   Take 1 tablet (5 mg total) by mouth daily.      aspirin EC 81 MG tablet   Take 81 mg by mouth daily.      CALCIUM 1200 PO   Take by mouth. 2 daily      citalopram 20 MG tablet   Commonly known as: CELEXA   Take 1 tablet (20 mg total) by mouth daily.      Fish Oil 1200 MG Caps   Take 1 capsule by mouth daily.      lisinopril 10 MG tablet   Commonly known as: PRINIVIL,ZESTRIL   Take 1 tablet (10 mg total) by mouth daily. Hold for 2 days, restart on 06/03/2011.      multivitamin tablet   Take 1 tablet by mouth daily.      mycophenolate 500 MG tablet   Commonly known as: CELLCEPT   Take 1 tablet (500 mg total) by mouth 2 (two) times daily.      nitroGLYCERIN 0.4 MG SL tablet   Commonly known as: NITROSTAT   Place 0.4 mg under the tongue every 5 (five) minutes as needed. For chest pain      omeprazole 40 MG capsule   Commonly known as: PRILOSEC   Take 1 capsule (40 mg total) by mouth daily.      OSTEO BI-FLEX REGULAR STRENGTH PO   Take by mouth. daily      predniSONE 10 MG tablet   Commonly known as: DELTASONE   Take 4 for 5  days 3 for 5  days 2 for 5  days  Then 1 a day      simvastatin 40 MG tablet   Commonly known as: ZOCOR   Take 1 tablet (40 mg total) by  mouth at bedtime.      sulfamethoxazole-trimethoprim 800-160 MG per tablet   Commonly known as: BACTRIM DS   One tablet three times weekly      VITAMIN D-3 PO   Take by mouth. 1 tab ndaily  Discharge Orders    Future Appointments: Provider: Department: Dept Phone: Center:   06/09/2012 11:15 AM Elsie Stain, MD [facility] [place] [phone] None   06/13/2012 9:45 AM [name], MD [facility]) [phone] [id number]     Future Orders Please Complete By Expires   Diet - low sodium heart healthy      Increase activity slowly      (HEART FAILURE PATIENTS) Call MD:  Anytime you have any of the following symptoms: 1) 3 pound weight gain in 24 hours or 5 pounds in 1 week 2) shortness of breath, with or without a dry hacking cough 3) swelling in the hands, feet or stomach 4) if you have to sleep on extra pillows at night in order to breathe.      Discharge instructions      Comments:   Weigh daily first thing in the morning.     Follow-up Information    Follow up with Dorris Carnes, MD. On 06/13/2012. (at 9:45 am)    Contact information:   [address] [place] [address] [phone]       Follow up with Asencion Noble, MD. On 06/09/2012. (at 11:15 am)    Contact information:   [address] [place] [address] [phone]          BRING ALL MEDICATIONS WITH YOU TO FOLLOW UP APPOINTMENTS  Time spent with patient to include physician time: 35 min Signed: Rosaria Ferries 05/30/2012, 11:18 AM Co-Sign MD

## 2012-06-01 ENCOUNTER — Telehealth: Payer: Self-pay | Admitting: Critical Care Medicine

## 2012-06-01 NOTE — Telephone Encounter (Signed)
Spoke with Wayne Torres He states that he feels like getting "cold" Went to ED on 05/29/12 with c/o chills, and heart rate and BP "out of wack" He states that he was not admitted, and that they got his BP and pulse back to normal Since then he reports feeling tired- denies any changes in his breathing, wheezing, increased cough, f/c/s, sore throat, HA, CP/chest tightness I advised increase fluids, rest and may want to call PCP for recs since not having any respiratory co's. He states that he was told by PW to call him whenever he needed anything, and again asked for rx for "feeling tired" PW, please help thanks!

## 2012-06-01 NOTE — Telephone Encounter (Signed)
He needs an office Eval. He may have flu. This cannot be sorted out over the phone

## 2012-06-01 NOTE — Telephone Encounter (Signed)
Pt is aware of PW recommendation. He has been scheduled 06/03/11 @ 11:45am with TP.

## 2012-06-02 ENCOUNTER — Ambulatory Visit (INDEPENDENT_AMBULATORY_CARE_PROVIDER_SITE_OTHER): Payer: Medicare Other | Admitting: Adult Health

## 2012-06-02 ENCOUNTER — Encounter: Payer: Self-pay | Admitting: Adult Health

## 2012-06-02 VITALS — BP 106/68 | HR 79 | Temp 96.7°F | Ht 72.0 in | Wt 232.0 lb

## 2012-06-02 DIAGNOSIS — J40 Bronchitis, not specified as acute or chronic: Secondary | ICD-10-CM

## 2012-06-02 MED ORDER — AMOXICILLIN-POT CLAVULANATE 875-125 MG PO TABS: 1.0000 | ORAL_TABLET | Freq: Two times a day (BID) | ORAL | Status: AC

## 2012-06-02 NOTE — Progress Notes (Signed)
Subjective:    Patient ID: Wayne Torres, male    DOB: 12/06/1940, 72 y.o.   MRN: [id number]  HPI  04/28/2012 The patient notes no change in dyspnea with Spiriva treatment. The patient has undergone 6 minute walk and pulmonary function testing. The study showed moderate restriction on the total lung capacity and severe diffusion defect on DLCO Serologic studies are negative however allergy profile shows mold hypersensitivity Currently there is no chest pain, cough, or pedal edema. >>started on cellcept , bactrim   06/02/2012 Acute OV  Complains of runny nose, productive cough with milky sputum, fever and sweats at night x 4-5 days Recently admitted 3 days ago with CHF and anxiety . Had runny nose and felt feverish initally . Then very dyspneic . Felt to have CHF complicated by anxitey .  Improved with diuresis.  CXR w/ mild diffuse interstitial pulmonary edema.   BNP was elevated ~14000.  Cardiac enzymes neg.  Echo showed nml EF.  No hemoptysis , chest pain or edema.       Past Medical History  Diagnosis Date  . GI bleed   . Nausea and vomiting   . Abrasion of knee, left   . Shoulder joint pain   . Coronary artery disease s/p CABG 1990s     a. approx 1994 s/p CABG x 4 (LIMA->LAD, VG->RCA, VG->RI, VG->Diag);  b. 11/2008 Cath: LM 100, LAD small, RCA 50-60p, 100d, VG->RCA 100 ost, VG->RI 16m VG->Diag 40 - upper branch of diag small, 99%, LIMA-LAD patent.;  c. 08/2011 Echo: EF 45-50%, mod dil LA,/RA/RV with mild-mod reduced RV fxn.  . Back pain   . Hip pain, right   . PSA (psoriatic arthritis)     increased  . Lumbar disc disease   . Malignant neoplasm of prostate   . Fatigue   . Pulmonary fibrosis, postinflammatory     a. started on prednisone and cellcept 04/28/2012  . Renal calculus   . Osteoarthritis   . HTN (hypertension)   . Depression   . Anxiety   . ED (erectile dysfunction)   . Hyperlipidemia   . GERD (gastroesophageal reflux disease)   . Allergic rhinitis   .  Peptic stricture of esophagus   . Complete heart block     a. s/p SJM Accent dc ppm, ser # [id number] . Acute bronchitis 02/07/2012  . PVD (peripheral vascular disease)     a. 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  bilat ext iliac dzs.  . Pneumonia 1993    post-op     Family History  Problem Relation Age of Onset  . Heart attack Father   . Heart disease Father   . Colon cancer Neg Hx   . Cancer Neg Hx     Colon     History   Social History  . Marital Status: Married    Spouse Name: N/A    Number of Children: 2  . Years of Education: N/A   Occupational History  . Retired    Social History Main Topics  . Smoking status: Former Smoker -- 1.5 packs/day for 42 years    Types: Cigarettes    Quit date: 09/11/2001  . Smokeless tobacco: Never Used     Comment: Pt does not get regular exercise  . Alcohol Use: No  . Drug Use: No  . Sexually Active: Yes   Other Topics Concern  . 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     No Known Allergies   Outpatient Prescriptions Prior to Visit  Medication Sig Dispense Refill  . ALPRAZolam (XANAX) 0.25 MG tablet Take 1 tablet (0.25 mg total) by mouth daily as needed. anxiety  30 tablet  5  . amLODipine (NORVASC) 5 MG tablet Take 1 tablet (5 mg total) by mouth daily.  90 tablet  3  . aspirin EC 81 MG tablet Take 81 mg by mouth daily.      . Calcium Carbonate-Vit D-Min (CALCIUM 1200 PO) Take by mouth. 2 daily      . Cholecalciferol (VITAMIN D-3 PO) Take by mouth. 1 tab ndaily      . citalopram (CELEXA) 20 MG tablet Take 1 tablet (20 mg total) by mouth daily.  90 tablet  3  . Glucosamine-Chondroitin (OSTEO BI-FLEX REGULAR STRENGTH PO) Take by mouth. daily      . lisinopril (PRINIVIL,ZESTRIL) 10 MG tablet Take 1 tablet (10 mg total) by mouth daily. Hold for 2 days, restart on 06/03/2011.   90 tablet  3  . Multiple Vitamin (MULTIVITAMIN) tablet Take 1 tablet by mouth daily.      . mycophenolate (CELLCEPT) 500 MG tablet Take 1 tablet (500 mg total) by mouth 2 (two) times daily.  60 tablet  6  . nitroGLYCERIN (NITROSTAT) 0.4 MG SL tablet Place 0.4 mg under the tongue every 5 (five) minutes as needed. For chest pain      . Omega-3 Fatty Acids (FISH OIL) 1200 MG CAPS Take 1 capsule by mouth daily.      Marland Kitchen omeprazole (PRILOSEC) 40 MG capsule Take 1 capsule (40 mg total) by mouth daily.  90 capsule  3  . simvastatin (ZOCOR) 40 MG tablet Take 1 tablet (40 mg total) by mouth at bedtime.  90 tablet  3  . [DISCONTINUED] predniSONE (DELTASONE) 10 MG tablet Take 4 for 5  days 3 for 5  days 2 for 5  days  Then 1 a day  60 tablet  6  . [DISCONTINUED] sulfamethoxazole-trimethoprim (BACTRIM DS) 800-160 MG per tablet One tablet three times weekly  30 tablet  6  Last reviewed on 06/02/2012 11:52 AM by Lulu Riding, MA    Review of Systems  11 point review of systems taken in detail and is negative except as noted in history present illness    Objective:   Physical Exam  Filed Vitals:   06/02/12 1147  BP: 106/68  Pulse: 79  Temp: 96.7 F (35.9 C)  TempSrc: Oral  Height: 6' (1.829 m)  Weight: 232 lb (105.235 kg)  SpO2: 95%    Gen: Pleasant,  in no distress,  normal affect  ENT: No lesions,  mouth clear,  oropharynx clear, no postnasal drip  Neck: No JVD, no TMG, no carotid bruits  Lungs: No use of accessory muscles, no dullness to percussion, dry rales bibasilar   Cardiovascular: RRR, heart sounds normal, no murmur or gallops, no peripheral edema  Abdomen: soft and NT, no HSM,  BS normal  Musculoskeletal: No deformities, no cyanosis or clubbing  Neuro: alert, non focal  Skin: Warm, no lesions or rashes  No results found.    04/18/12: 6MW 375M  No desat on RA PFTs  04/18/12: FeV1 79%  FVC 66%  FeV1/FVC: 68%  TLC 60%  DLCO 40%        Assessment & Plan:

## 2012-06-02 NOTE — Patient Instructions (Addendum)
Begin Augmentin 832m Twice daily  For 7 days  Mucinex DM Twice daily  As needed  Cough/congestion  Fluids and rest  Tylenol As needed   follow up Dr. WJoya Gaskins As planned and As needed   Please contact office for sooner follow up if symptoms do not improve or worsen or seek emergency care

## 2012-06-02 NOTE — Assessment & Plan Note (Addendum)
Acute Bronchitis w/ underlying Fibrosis on immunosuppression regimen.  Recent hospitalization ? Fluid overload resolved with diuresis.  Will tx w/ abx  W/ close follow up   Plan  Begin Augmentin 848m Twice daily  For 7 days  Mucinex DM Twice daily  As needed  Cough/congestion  Fluids and rest  Tylenol As needed   follow up Dr. WJoya Gaskins As planned and As needed   Please contact office for sooner follow up if symptoms do not improve or worsen or seek emergency care

## 2012-06-09 ENCOUNTER — Encounter: Payer: Self-pay | Admitting: Critical Care Medicine

## 2012-06-09 ENCOUNTER — Ambulatory Visit (INDEPENDENT_AMBULATORY_CARE_PROVIDER_SITE_OTHER): Payer: Medicare Other | Admitting: Critical Care Medicine

## 2012-06-09 VITALS — BP 118/82 | HR 62 | Temp 98.4°F | Ht 72.0 in | Wt 225.0 lb

## 2012-06-09 DIAGNOSIS — J8410 Pulmonary fibrosis, unspecified: Secondary | ICD-10-CM

## 2012-06-09 DIAGNOSIS — T50905S Adverse effect of unspecified drugs, medicaments and biological substances, sequela: Secondary | ICD-10-CM

## 2012-06-09 LAB — CBC WITH DIFFERENTIAL/PLATELET
Eosinophils Absolute: 0.1 10*3/uL (ref 0.0–0.7)
Lymphocytes Relative: 17 % (ref 12–46)
Lymphs Abs: 1.9 10*3/uL (ref 0.7–4.0)
Neutrophils Relative %: 73 % (ref 43–77)
Platelets: 364 10*3/uL (ref 150–400)
RBC: 5.23 MIL/uL (ref 4.22–5.81)
WBC: 11.7 10*3/uL — ABNORMAL HIGH (ref 4.0–10.5)

## 2012-06-09 MED ORDER — PREDNISONE 10 MG PO TABS: ORAL_TABLET | ORAL | Status: DC

## 2012-06-09 NOTE — Progress Notes (Signed)
Subjective:    Patient ID: Wayne Torres, male    DOB: 14-Nov-1940, 72 y.o.   MRN: [id number]  HPI  04/28/2012 The patient notes no change in dyspnea with Spiriva treatment. The patient has undergone 6 minute walk and pulmonary function testing. The study showed moderate restriction on the total lung capacity and severe diffusion defect on DLCO Serologic studies are negative however allergy profile shows mold hypersensitivity Currently there is no chest pain, cough, or pedal edema. >>started on cellcept , bactrim   06/02/12 Acute OV  Complains of runny nose, productive cough with milky sputum, fever and sweats at night x 4-5 days Recently admitted 3 days ago with CHF and anxiety . Had runny nose and felt feverish initally . Then very dyspneic . Felt to have CHF complicated by anxitey .  Improved with diuresis.  CXR w/ mild diffuse interstitial pulmonary edema.   BNP was elevated ~14000.  Cardiac enzymes neg.  Echo showed nml EF.  No hemoptysis , chest pain or edema.   06/09/2012 Since last OV 12/13: admitted with CHF and then rx bronchitis with TP with augmentin ? Felt like may have had anxiety and then ILI illness. Pt awoke shaking and teeth chattering. Pt went into pulmonary edema.  This is the first spell of CHF. Prior CABG .    Note cardiac enzymes normal.       Past Medical History  Diagnosis Date  . GI bleed   . Nausea and vomiting   . Abrasion of knee, left   . Shoulder joint pain   . Coronary artery disease s/p CABG 1990s     a. approx 1994 s/p CABG x 4 (LIMA->LAD, VG->RCA, VG->RI, VG->Diag);  b. 11/2008 Cath: LM 100, LAD small, RCA 50-60p, 100d, VG->RCA 100 ost, VG->RI 70m VG->Diag 40 - upper branch of diag small, 99%, LIMA-LAD patent.;  c. 08/2011 Echo: EF 45-50%, mod dil LA,/RA/RV with mild-mod reduced RV fxn.  . Back pain   . Hip pain, right   . PSA (psoriatic arthritis)     increased  . Lumbar disc disease   . Malignant neoplasm of prostate   . Fatigue   .  Pulmonary fibrosis, postinflammatory     a. started on prednisone and cellcept 04/28/2012  . Renal calculus   . Osteoarthritis   . HTN (hypertension)   . Depression   . Anxiety   . ED (erectile dysfunction)   . Hyperlipidemia   . GERD (gastroesophageal reflux disease)   . Allergic rhinitis   . Peptic stricture of esophagus   . Complete heart block     a. s/p SJM Accent dc ppm, ser # [id number] . Acute bronchitis 02/07/2012  . PVD (peripheral vascular disease)     a. 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  bilat ext iliac dzs.  . Pneumonia 1993    post-op     Family History  Problem Relation Age of Onset  . Heart attack Father   . Heart disease Father   . Colon cancer Neg Hx   . Cancer Neg Hx     Colon     History   Social History  . Marital Status: Married    Spouse Name: N/A    Number of Children: 2  . Years of Education: N/A   Occupational History  . Retired    Social History Main Topics  . Smoking status: Former Smoker -- 1.5 packs/day for 42 years  Types: Cigarettes    Quit date: 09/11/2001  . Smokeless tobacco: Never Used     Comment: Pt does not get regular exercise  . Alcohol Use: No  . Drug Use: No  . Sexually Active: Yes   Other Topics Concern  . 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     No Known Allergies   Outpatient Prescriptions Prior to Visit  Medication Sig Dispense Refill  . ALPRAZolam (XANAX) 0.25 MG tablet Take 1 tablet (0.25 mg total) by mouth daily as needed. anxiety  30 tablet  5  . amLODipine (NORVASC) 5 MG tablet Take 1 tablet (5 mg total) by mouth daily.  90 tablet  3  . amoxicillin-clavulanate (AUGMENTIN) 875-125 MG per tablet Take 1 tablet by mouth 2 (two) times daily.  14 tablet  0  . aspirin EC 81 MG tablet Take 81 mg by mouth daily.      . Calcium Carbonate-Vit D-Min  (CALCIUM 1200 PO) Take by mouth. 2 daily      . Cholecalciferol (VITAMIN D-3 PO) Take by mouth. 1 tab ndaily      . citalopram (CELEXA) 20 MG tablet Take 1 tablet (20 mg total) by mouth daily.  90 tablet  3  . Glucosamine-Chondroitin (OSTEO BI-FLEX REGULAR STRENGTH PO) Take by mouth. daily      . Multiple Vitamin (MULTIVITAMIN) tablet Take 1 tablet by mouth daily.      . mycophenolate (CELLCEPT) 500 MG tablet Take 1 tablet (500 mg total) by mouth 2 (two) times daily.  60 tablet  6  . nitroGLYCERIN (NITROSTAT) 0.4 MG SL tablet Place 0.4 mg under the tongue every 5 (five) minutes as needed. For chest pain      . Omega-3 Fatty Acids (FISH OIL) 1200 MG CAPS Take 1 capsule by mouth daily.      Marland Kitchen omeprazole (PRILOSEC) 40 MG capsule Take 1 capsule (40 mg total) by mouth daily.  90 capsule  3  . simvastatin (ZOCOR) 40 MG tablet Take 1 tablet (40 mg total) by mouth at bedtime.  90 tablet  3  . sulfamethoxazole-trimethoprim (BACTRIM DS,SEPTRA DS) 800-160 MG per tablet Take 1 tablet by mouth 3 (three) times a week.      . [DISCONTINUED] lisinopril (PRINIVIL,ZESTRIL) 10 MG tablet Take 1 tablet (10 mg total) by mouth daily. Hold for 2 days, restart on 06/03/2011.  90 tablet  3  Last reviewed on 06/09/2012 11:08 AM by Jonelle Sports, RN    Review of Systems  11 point review of systems taken in detail and is negative except as noted in history present illness    Objective:   Physical Exam  Filed Vitals:   06/09/12 1108  BP: 118/82  Pulse: 62  Temp: 98.4 F (36.9 C)  TempSrc: Oral  Height: 6' (1.829 m)  Weight: 225 lb (102.059 kg)  SpO2: 93%    Gen: Pleasant,  in no distress,  normal affect  ENT: No lesions,  mouth clear,  oropharynx clear, no postnasal drip  Neck: No JVD, no TMG, no carotid bruits  Lungs: No use of accessory muscles, no dullness to percussion, dry rales bibasilar   Cardiovascular: RRR, heart sounds normal, no murmur or gallops, no peripheral edema  Abdomen: soft and  NT, no HSM,  BS normal  Musculoskeletal: No deformities, no cyanosis or clubbing  Neuro: alert, non focal  Skin:  Warm, no lesions or rashes  No results found.    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 ALPRAZolam (XANAX) 0.25 MG tablet Take 1 tablet (0.25 mg total) by mouth daily as needed. anxiety  30 tablet  5  . amLODipine (NORVASC) 5 MG tablet Take 1 tablet (5 mg total) by mouth daily.  90 tablet  3  . [EXPIRED] amoxicillin-clavulanate (AUGMENTIN) 875-125 MG per tablet Take 1 tablet by mouth 2 (two) times daily.  14 tablet  0  . aspirin EC 81 MG tablet Take 81 mg by mouth daily.      . Calcium Carbonate-Vit D-Min (CALCIUM 1200 PO) Take by mouth. 2 daily      . Cholecalciferol (VITAMIN D-3 PO) Take by mouth. 1 tab ndaily      . citalopram (CELEXA) 20 MG tablet Take 1 tablet (20 mg total) by mouth daily.  90 tablet  3  . Glucosamine-Chondroitin (OSTEO BI-FLEX REGULAR STRENGTH PO) Take by mouth. daily      . lisinopril (PRINIVIL,ZESTRIL) 10 MG tablet Take 10  mg by mouth daily.      . Multiple Vitamin (MULTIVITAMIN) tablet Take 1 tablet by mouth daily.      . mycophenolate (CELLCEPT) 500 MG tablet Take 1 tablet (500 mg total) by mouth 2 (two) times daily.  60 tablet  6  . nitroGLYCERIN (NITROSTAT) 0.4 MG SL tablet Place 0.4 mg under the tongue every 5 (five) minutes as needed. For chest pain      . Omega-3 Fatty Acids (FISH OIL) 1200 MG CAPS Take 1 capsule by mouth daily.      Marland Kitchen omeprazole (PRILOSEC) 40 MG capsule Take 1 capsule (40 mg total) by mouth daily.  90 capsule  3  . predniSONE (DELTASONE) 10 MG tablet Take one daily for 2 weeks then 1 every other day for 2 weeks then stop      . simvastatin (ZOCOR) 40 MG tablet Take 1 tablet (40 mg total) by mouth at bedtime.  90 tablet  3  . sulfamethoxazole-trimethoprim (BACTRIM DS,SEPTRA DS) 800-160 MG per tablet Take 1 tablet by mouth 3 (three) times a week.      . [DISCONTINUED] lisinopril (PRINIVIL,ZESTRIL) 10 MG tablet Take 1 tablet (10 mg total) by mouth daily. Hold for 2 days, restart on 06/03/2011.  90 tablet  3  . [DISCONTINUED] predniSONE (DELTASONE) 10 MG tablet Taper as directed

## 2012-06-09 NOTE — Patient Instructions (Addendum)
Prednisone: Take one daily for 2 weeks then 1 every other day for 2 weeks then stop Stay on Cellcept Stay on bactrim Finish augmentin Keep cardiology appointment Labs today Return 4 months

## 2012-06-10 ENCOUNTER — Telehealth: Payer: Self-pay | Admitting: Internal Medicine

## 2012-06-10 LAB — HEPATIC FUNCTION PANEL
Albumin: 4.2 g/dL (ref 3.5–5.2)
Alkaline Phosphatase: 56 U/L (ref 39–117)
Indirect Bilirubin: 0.5 mg/dL (ref 0.0–0.9)
Total Bilirubin: 0.7 mg/dL (ref 0.3–1.2)

## 2012-06-10 NOTE — Telephone Encounter (Signed)
Called patient's wife. She states that he had an appointment with Dr.Ross on 1/20 but it was changed to 1/27 by the scheduling team because of meeting issues. Advised that she is not in the office in between these dates. She thinks that Dr.Wright feels he needs another heart cath. Advised to keep appointment on 1/27.

## 2012-06-10 NOTE — Assessment & Plan Note (Signed)
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

## 2012-06-10 NOTE — Progress Notes (Signed)
Quick Note:  Call pt and tell his labs are ok, No change in medications, no evidence for side effects from cellcept ______

## 2012-06-10 NOTE — Progress Notes (Signed)
Quick Note:  Called, spoke with pt. Informed him of lab results and recs per Dr. Wright. He verbalized understanding and voiced no further questions or concerns at this time. ______ 

## 2012-06-10 NOTE — Telephone Encounter (Signed)
New problem:    Seen Dr. Joya Gaskins on yesterday calling to discuss

## 2012-06-13 ENCOUNTER — Encounter: Payer: Medicare Other | Admitting: Internal Medicine

## 2012-06-20 ENCOUNTER — Ambulatory Visit (INDEPENDENT_AMBULATORY_CARE_PROVIDER_SITE_OTHER): Payer: Medicare Other | Admitting: Internal Medicine

## 2012-06-20 ENCOUNTER — Encounter: Payer: Self-pay | Admitting: Internal Medicine

## 2012-06-20 VITALS — BP 110/70 | HR 77 | Ht 72.0 in | Wt 229.5 lb

## 2012-06-20 DIAGNOSIS — R0602 Shortness of breath: Secondary | ICD-10-CM

## 2012-06-20 NOTE — Patient Instructions (Addendum)
LABS TODAY:  CBC, BMET, BNP, ESR, D-DIMER  Your physician has requested that you have a lexiscan myoview. For further information please visit [email or web address]. Please follow instruction sheet, as given.

## 2012-06-20 NOTE — Progress Notes (Addendum)
HPI Wayne Torres is a 72 year old gentleman. He has a history of coronary artery disease. I last saw the patient back in May of last year. Last cardiac catheterization (11/2008) showed 100% left main RCA was diffusely diseased 50-60% proximal 50% mid. Vessel is occluded after the PDA, SVG RCA was occluded ostially, SVG to ramus was ectatic with 30% blockage in the midsection. SVG to diagonal was ectatic 40% lesion. Diagonal was patent. Upper branch had a 99% stenosis, but was a small vessel. There were left to right collaterals. Left subclavian tortuous. No flow-limiting plaque, LIMA to LAD was patent. LAD was small. Abdominal aortogram showed patent renal arteries. 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  He is s/p PPM for [facility].   No Known Allergies  Current Outpatient Prescriptions  Medication Sig Dispense Refill  . ALPRAZolam (XANAX) 0.25 MG tablet Take 1 tablet (0.25 mg total) by mouth daily as needed. anxiety  30 tablet  5  . amLODipine (NORVASC) 5 MG tablet Take 1 tablet (5 mg total) by mouth daily.  90 tablet  3  . aspirin EC 81 MG tablet Take 81 mg by mouth daily.      . Calcium Carbonate-Vit D-Min (CALCIUM 1200 PO) Take by mouth. 2 daily      . Cholecalciferol (VITAMIN D-3 PO) Take by mouth. 1 tab ndaily      . citalopram (CELEXA) 20 MG tablet Take 1 tablet (20 mg total) by mouth daily.  90 tablet  3  . Glucosamine-Chondroitin (OSTEO BI-FLEX REGULAR STRENGTH PO) Take by mouth. daily      . lisinopril (PRINIVIL,ZESTRIL) 10 MG tablet Take 10 mg by mouth daily.      . Multiple Vitamin (MULTIVITAMIN) tablet Take 1 tablet by mouth daily.      . mycophenolate (CELLCEPT) 500 MG tablet Take 1 tablet (500 mg total) by mouth 2 (two) times daily.  60 tablet  6  . nitroGLYCERIN (NITROSTAT) 0.4 MG SL tablet Place 0.4 mg under the tongue every 5 (five) minutes as needed. For chest pain      .  Omega-3 Fatty Acids (FISH OIL) 1200 MG CAPS Take 1 capsule by mouth daily.      Marland Kitchen omeprazole (PRILOSEC) 40 MG capsule Take 1 capsule (40 mg total) by mouth daily.  90 capsule  3  . simvastatin (ZOCOR) 40 MG tablet Take 1 tablet (40 mg total) by mouth at bedtime.  90 tablet  3  . predniSONE (DELTASONE) 10 MG tablet       . sulfamethoxazole-trimethoprim (BACTRIM DS) 800-160 MG per tablet         Past Medical History  Diagnosis Date  . GI bleed   . Nausea and vomiting   . Abrasion of knee, left   . Shoulder joint pain   . Coronary artery disease s/p CABG 1990s     a. approx 1994 s/p CABG x 4 (LIMA->LAD, VG->RCA, VG->RI, VG->Diag);  b. 11/2008 Cath: LM 100, LAD small, RCA 50-60p, 100d, VG->RCA 100 ost, VG->RI 68m VG->Diag 40 - upper branch of diag small, 99%, LIMA-LAD patent.;  c. 08/2011 Echo: EF 45-50%, mod dil LA,/RA/RV with mild-mod reduced RV fxn.  . Back pain   . Hip pain, right   . PSA (psoriatic arthritis)     increased  . Lumbar disc disease   . Malignant neoplasm of prostate   . Fatigue   . Pulmonary  fibrosis, postinflammatory     a. started on prednisone and cellcept 04/28/2012  . Renal calculus   . Osteoarthritis   . HTN (hypertension)   . Depression   . Anxiety   . ED (erectile dysfunction)   . Hyperlipidemia   . GERD (gastroesophageal reflux disease)   . Allergic rhinitis   . Peptic stricture of esophagus   . Complete heart block     a. s/p SJM Accent dc ppm, ser # [id number]  . Acute bronchitis 02/07/2012  . PVD (peripheral vascular disease)     a. 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  bilat ext iliac dzs.  . Pneumonia 1993    post-op    Past Surgical History  Procedure Date  . Coronary angioplasty 06/03/2007, 01/25/2009  . Nasal septum surgery april 2012    dr britt, [facility] [place]  . Coronary artery bypass graft 20 yrs ago    LIMA to LAD, SVG to PDA, SVG to Ramus, SVG to D  . Pacemaker placement 08/2011    Family History    Problem Relation Age of Onset  . Heart attack Father   . Heart disease Father   . Colon cancer Neg Hx   . Cancer Neg Hx     Colon    History   Social History  . Marital Status: Married    Spouse Name: N/A    Number of Children: 2  . Years of Education: N/A   Occupational History  . Retired    Social History Main Topics  . Smoking status: Former Smoker -- 1.5 packs/day for 42 years    Types: Cigarettes    Quit date: 09/11/2001  . Smokeless tobacco: Never Used     Comment: Pt does not get regular exercise  . Alcohol Use: No  . Drug Use: No  . Sexually Active: Yes   Other Topics Concern  . 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    Review of Systems:  All systems reviewed.  They are negative to the above problem except as previously stated.  Vital Signs: BP 110/70  Pulse 77  Ht 6' (1.829 m)  Wt 229 lb 8 oz (104.101 kg)  BMI 31.13 kg/m2 O2 sat was 95 at rest  Decreased to 86% on RA with walking. Physical Exam Patient is in NAD HEENT:  Normocephalic, atraumatic. EOMI, PERRLA.  Neck: JVP is normal.  No bruits.  Lungs: clear to auscultation. No rales no wheezes.  Heart: Regular rate and rhythm. Normal S1, S2. No S3.   No significant murmurs. PMI not displaced.  Abdomen:  Supple, nontender. Normal bowel sounds. No masses. No hepatomegaly.  Extremities:   Good distal pulses throughout. No lower extremity edema.  Musculoskeletal :moving all extremities.  Neuro:   alert and oriented x3.  CN II-XII grossly intact.   Assessment and Plan:  1.  Dyspnea.  I am not convinced it is cardiac.  Today his oxygen dipped to 86% with walking  Was 95% at rest.  He says it did not do this in pulmonary clinic when he was there His volume status appears OK Will check labs.  Will set up for myoview to exclude ischemia though that should not cause dip in O2.   2.  CAD   As above.  3.  S/p  PPM  Addendum:  Patient underwent lexiscan myoview  This showed inferior infarct with periinfarct ischemia.  Defect was more pronounced than what reported previously.  LVEF in 30s.  I have reviewed recent echo and LVEF appears less than previously reported.  In low 30% range with wall motion abnormalities. I have reviewed with patient  With worsening symptoms that started fairly abruptly I would recomm R and L heart cath to define anatomy/pressures. He agrees.    Dorris Carnes

## 2012-06-21 LAB — BASIC METABOLIC PANEL
CO2: 24 mEq/L (ref 19–32)
Chloride: 105 mEq/L (ref 96–112)
Creatinine, Ser: 1.3 mg/dL (ref 0.4–1.5)

## 2012-06-21 LAB — CBC WITH DIFFERENTIAL/PLATELET
Basophils Relative: 0.2 % (ref 0.0–3.0)
Eosinophils Relative: 0.2 % (ref 0.0–5.0)
HCT: 41.1 % (ref 39.0–52.0)
Hemoglobin: 13.9 g/dL (ref 13.0–17.0)
Lymphs Abs: 1.4 10*3/uL (ref 0.7–4.0)
MCV: 93.2 fl (ref 78.0–100.0)
Monocytes Absolute: 0.6 10*3/uL (ref 0.1–1.0)
Neutro Abs: 5.5 10*3/uL (ref 1.4–7.7)
Platelets: 230 10*3/uL (ref 150.0–400.0)
RBC: 4.41 Mil/uL (ref 4.22–5.81)
WBC: 7.6 10*3/uL (ref 4.5–10.5)

## 2012-06-27 ENCOUNTER — Encounter: Payer: Self-pay | Admitting: Internal Medicine

## 2012-06-28 ENCOUNTER — Ambulatory Visit (HOSPITAL_COMMUNITY): Payer: Medicare Other | Attending: Cardiology | Admitting: Radiology

## 2012-06-28 VITALS — BP 127/80 | HR 67 | Ht 72.0 in | Wt 229.0 lb

## 2012-06-28 DIAGNOSIS — R0609 Other forms of dyspnea: Secondary | ICD-10-CM | POA: Insufficient documentation

## 2012-06-28 DIAGNOSIS — R000 Tachycardia, unspecified: Secondary | ICD-10-CM | POA: Insufficient documentation

## 2012-06-28 DIAGNOSIS — I447 Left bundle-branch block, unspecified: Secondary | ICD-10-CM

## 2012-06-28 DIAGNOSIS — R0989 Other specified symptoms and signs involving the circulatory and respiratory systems: Secondary | ICD-10-CM | POA: Insufficient documentation

## 2012-06-28 DIAGNOSIS — I2510 Atherosclerotic heart disease of native coronary artery without angina pectoris: Secondary | ICD-10-CM

## 2012-06-28 DIAGNOSIS — I739 Peripheral vascular disease, unspecified: Secondary | ICD-10-CM | POA: Insufficient documentation

## 2012-06-28 DIAGNOSIS — R0602 Shortness of breath: Secondary | ICD-10-CM

## 2012-06-28 DIAGNOSIS — I10 Essential (primary) hypertension: Secondary | ICD-10-CM | POA: Insufficient documentation

## 2012-06-28 MED ORDER — TECHNETIUM TC 99M SESTAMIBI GENERIC - CARDIOLITE
33.0000 | Freq: Once | INTRAVENOUS | Status: AC | PRN
  Administered 2012-06-28: 33 via INTRAVENOUS

## 2012-06-28 MED ORDER — TECHNETIUM TC 99M SESTAMIBI GENERIC - CARDIOLITE
11.0000 | Freq: Once | INTRAVENOUS | Status: AC | PRN
  Administered 2012-06-28: 11 via INTRAVENOUS

## 2012-06-28 MED ORDER — REGADENOSON 0.4 MG/5ML IV SOLN
0.4000 mg | Freq: Once | INTRAVENOUS | Status: AC
  Administered 2012-06-28: 0.4 mg via INTRAVENOUS

## 2012-06-28 NOTE — Progress Notes (Signed)
Wayne Torres [address] [place], [place] [address] [phone]    Cardiology Nuclear Med Study  Wayne Torres is a 72 y.o. male     MRN : [id number]     DOB: March 30, 1941  Procedure Date: 06/28/2012  Nuclear Med Background Indication for Stress Test:  Evaluation for Ischemia and Graft Patency History:  '94 CABG; '09 XVE:ZBMZ distal inferior ischemia; '10 Cath:3/4 grafts patent, but with n/o dz; '13 PTVP; 05/30/12 Echo:EF=45% Cardiac Risk Factors: Claudication, Family History - CAD, History of Smoking, Hypertension, Lipids, Obesity and PVD  Symptoms:  DOE, Fatigue and Rapid HR   Nuclear Pre-Procedure Caffeine/Decaff Intake:  None NPO After: 7:00pm   Lungs:  Slight rhonchi, no wheezes. O2 Sat: 97% on room air. IV 0.9% NS with Angio Cath:  22g  IV Site: R Hand  IV Started by:  Matilde Haymaker, RN  Chest Size (in):  50 Cup Size: n/a  Height: 6' (1.829 m)  Weight:  229 lb (103.874 kg)  BMI:  Body mass index is 31.06 kg/(m^2). Tech Comments:  n/a    Nuclear Med Study 1 or 2 day study: 1 day  Stress Test Type:  Lexiscan  Reading MD: Kirk Ruths, MD  Order Authorizing Provider:  Dorris Carnes, MD  Resting Radionuclide: Technetium 57mSestamibi  Resting Radionuclide Dose: 11.0 mCi   Stress Radionuclide:  Technetium 934mestamibi  Stress Radionuclide Dose: 33.0 mCi           Stress Protocol Rest HR: 67 Stress HR: 83  Rest BP: 127/80 Stress BP: 119/78  Exercise Time (min): n/a METS: n/a   Predicted Max HR: 150 bpm % Max HR: 55.33 bpm Rate Pressure Product: 9877    Dose of Adenosine (mg):  n/a Dose of Lexiscan: 0.4 mg  Dose of Atropine (mg): n/a Dose of Dobutamine: n/a mcg/kg/min (at max HR)  Stress Test Technologist: ShLetta MoynahanCMA-N  Nuclear Technologist:  StCharlton AmorCNMT     Rest Procedure:  Myocardial perfusion imaging was performed at rest 45 minutes following the intravenous administration of Technetium 993mstamibi.  Rest  ECG: Ventricular pacing  Stress Procedure:  The patient received IV Lexiscan 0.4 mg over 15-seconds.  Technetium 58m50mtamibi injected at 30-seconds.  Quantitative spect images were obtained after a 45 minute delay.  Stress ECG: Uninteretable due to baseline ventricular pacing  QPS Raw Data Images:  Acquisition technically good; LVE. Stress Images:  There is decreased uptake in the inferior wall. Rest Images:  There is decreased uptake in the inferior wall, less prominent compared to the stress images. Subtraction (SDS):  These findings are consistent with prior inferior infarct and mild peri-infarct ischemia. Transient Ischemic Dilatation (Normal <1.22):  1.14 Lung/Heart Ratio (Normal <0.45):  0.52  Quantitative Gated Spect Images QGS EDV:  176 ml QGS ESV:  122 ml  Impression Exercise Capacity:  Lexiscan with no exercise. BP Response:  Normal blood pressure response. Clinical Symptoms:  No chest pain or dyspnea. ECG Impression: EKG uninterpretable due to ventricular pacing at rest and stress. Comparison with Prior Nuclear Study: No images to compare  Overall Impression:  High risk stress nuclear study related to severity of LV dysfunction; large, severe, partially reversible inferior defect consistent with prior inferior infarct and mild peri-infarct ischemia.  LV Ejection Fraction: 31%.  LV Wall Motion:  Global hypokinesis with inferior akinesis.  BriaKirk Ruths

## 2012-07-01 ENCOUNTER — Encounter (HOSPITAL_COMMUNITY): Payer: Self-pay | Admitting: Pharmacy Technician

## 2012-07-01 ENCOUNTER — Other Ambulatory Visit: Payer: Self-pay | Admitting: *Deleted

## 2012-07-01 ENCOUNTER — Encounter: Payer: Self-pay | Admitting: *Deleted

## 2012-07-01 ENCOUNTER — Telehealth: Payer: Self-pay | Admitting: Internal Medicine

## 2012-07-01 DIAGNOSIS — I25810 Atherosclerosis of coronary artery bypass graft(s) without angina pectoris: Secondary | ICD-10-CM

## 2012-07-01 DIAGNOSIS — I509 Heart failure, unspecified: Secondary | ICD-10-CM

## 2012-07-01 DIAGNOSIS — I10 Essential (primary) hypertension: Secondary | ICD-10-CM

## 2012-07-01 DIAGNOSIS — I442 Atrioventricular block, complete: Secondary | ICD-10-CM

## 2012-07-01 NOTE — Telephone Encounter (Signed)
Wayne Torres has called for the results of his nuclear stress test.

## 2012-07-01 NOTE — Telephone Encounter (Signed)
New problem   Test results

## 2012-07-01 NOTE — Telephone Encounter (Signed)
Spoke to patient.   Would recomm L heart cath to redefine anatomy. Wll need L heart cath to define   Sched for Tuesday.  Needs precath labs.

## 2012-07-01 NOTE — Telephone Encounter (Signed)
Pt scheduled for R & L heart cath by Dr. P. Martinique on 07/05/2012.  Pt given instructions and will have labs drawn on Monday 07/04/2012.

## 2012-07-04 ENCOUNTER — Other Ambulatory Visit (INDEPENDENT_AMBULATORY_CARE_PROVIDER_SITE_OTHER): Payer: Medicare Other

## 2012-07-04 ENCOUNTER — Other Ambulatory Visit: Payer: Self-pay | Admitting: Internal Medicine

## 2012-07-04 DIAGNOSIS — I2510 Atherosclerotic heart disease of native coronary artery without angina pectoris: Secondary | ICD-10-CM

## 2012-07-04 DIAGNOSIS — I442 Atrioventricular block, complete: Secondary | ICD-10-CM

## 2012-07-04 DIAGNOSIS — I509 Heart failure, unspecified: Secondary | ICD-10-CM

## 2012-07-04 DIAGNOSIS — I10 Essential (primary) hypertension: Secondary | ICD-10-CM

## 2012-07-04 DIAGNOSIS — I25810 Atherosclerosis of coronary artery bypass graft(s) without angina pectoris: Secondary | ICD-10-CM

## 2012-07-04 LAB — CBC WITH DIFFERENTIAL/PLATELET
Basophils Absolute: 0 10*3/uL (ref 0.0–0.1)
Eosinophils Relative: 0.9 % (ref 0.0–5.0)
HCT: 42.8 % (ref 39.0–52.0)
Hemoglobin: 14.4 g/dL (ref 13.0–17.0)
Lymphocytes Relative: 25.7 % (ref 12.0–46.0)
Monocytes Relative: 10.4 % (ref 3.0–12.0)
Neutro Abs: 3.4 10*3/uL (ref 1.4–7.7)
Platelets: 278 10*3/uL (ref 150.0–400.0)
RDW: 15.2 % — ABNORMAL HIGH (ref 11.5–14.6)
WBC: 5.5 10*3/uL (ref 4.5–10.5)

## 2012-07-04 LAB — BASIC METABOLIC PANEL
CO2: 23 mEq/L (ref 19–32)
Chloride: 103 mEq/L (ref 96–112)
Glucose, Bld: 127 mg/dL — ABNORMAL HIGH (ref 70–99)
Sodium: 135 mEq/L (ref 135–145)

## 2012-07-04 LAB — PROTIME-INR: INR: 1.2 ratio — ABNORMAL HIGH (ref 0.8–1.0)

## 2012-07-04 LAB — APTT: aPTT: 29.3 s — ABNORMAL HIGH (ref 21.7–28.8)

## 2012-07-05 ENCOUNTER — Encounter (HOSPITAL_COMMUNITY)
Admission: RE | Disposition: A | Discharge status: Home / Self Care | Payer: Self-pay | Source: Ambulatory Visit | Attending: Cardiology

## 2012-07-05 ENCOUNTER — Ambulatory Visit (HOSPITAL_COMMUNITY)
Admission: RE | Admit: 2012-07-05 | Discharge: 2012-07-05 | Disposition: A | Discharge status: Home / Self Care | Payer: Medicare Other | Source: Ambulatory Visit | Attending: Cardiology | Admitting: Cardiology

## 2012-07-05 DIAGNOSIS — I10 Essential (primary) hypertension: Secondary | ICD-10-CM | POA: Insufficient documentation

## 2012-07-05 DIAGNOSIS — K219 Gastro-esophageal reflux disease without esophagitis: Secondary | ICD-10-CM | POA: Insufficient documentation

## 2012-07-05 DIAGNOSIS — I2510 Atherosclerotic heart disease of native coronary artery without angina pectoris: Secondary | ICD-10-CM | POA: Insufficient documentation

## 2012-07-05 DIAGNOSIS — Z950 Presence of cardiac pacemaker: Secondary | ICD-10-CM | POA: Insufficient documentation

## 2012-07-05 DIAGNOSIS — M1990 Unspecified osteoarthritis, unspecified site: Secondary | ICD-10-CM | POA: Insufficient documentation

## 2012-07-05 DIAGNOSIS — R0609 Other forms of dyspnea: Secondary | ICD-10-CM | POA: Insufficient documentation

## 2012-07-05 DIAGNOSIS — I25810 Atherosclerosis of coronary artery bypass graft(s) without angina pectoris: Secondary | ICD-10-CM | POA: Insufficient documentation

## 2012-07-05 DIAGNOSIS — R0989 Other specified symptoms and signs involving the circulatory and respiratory systems: Secondary | ICD-10-CM | POA: Insufficient documentation

## 2012-07-05 HISTORY — PX: LEFT AND RIGHT HEART CATHETERIZATION WITH CORONARY ANGIOGRAM: SHX5449

## 2012-07-05 LAB — POCT I-STAT 3, ART BLOOD GAS (G3+)
Bicarbonate: 22.4 mEq/L (ref 20.0–24.0)
O2 Saturation: 95 %
TCO2: 24 mmol/L (ref 0–100)
pCO2 arterial: 37.6 mmHg (ref 35.0–45.0)
pH, Arterial: 7.383 (ref 7.350–7.450)

## 2012-07-05 LAB — POCT I-STAT 3, VENOUS BLOOD GAS (G3P V)
Bicarbonate: 21.6 mEq/L (ref 20.0–24.0)
O2 Saturation: 68 %
pCO2, Ven: 40.6 mmHg — ABNORMAL LOW (ref 45.0–50.0)
pO2, Ven: 38 mmHg (ref 30.0–45.0)

## 2012-07-05 SURGERY — LEFT AND RIGHT HEART CATHETERIZATION WITH CORONARY ANGIOGRAM
Anesthesia: LOCAL

## 2012-07-05 MED ORDER — MIDAZOLAM HCL 2 MG/2ML IJ SOLN
INTRAMUSCULAR | Status: AC
  Filled 2012-07-05: qty 2

## 2012-07-05 MED ORDER — ASPIRIN 81 MG PO CHEW
324.0000 mg | CHEWABLE_TABLET | ORAL | Status: AC
  Administered 2012-07-05: 324 mg via ORAL

## 2012-07-05 MED ORDER — ACETAMINOPHEN 325 MG PO TABS: 650.0000 mg | ORAL_TABLET | ORAL | Status: DC | PRN

## 2012-07-05 MED ORDER — ONDANSETRON HCL 4 MG/2ML IJ SOLN: 4.0000 mg | Freq: Four times a day (QID) | INTRAMUSCULAR | Status: DC | PRN

## 2012-07-05 MED ORDER — SODIUM CHLORIDE 0.9 % IV SOLN
INTRAVENOUS | Status: DC
  Administered 2012-07-05: 1000 mL via INTRAVENOUS

## 2012-07-05 MED ORDER — ASPIRIN 81 MG PO CHEW
CHEWABLE_TABLET | ORAL | Status: AC
  Filled 2012-07-05: qty 4

## 2012-07-05 MED ORDER — HEPARIN (PORCINE) IN NACL 2-0.9 UNIT/ML-% IJ SOLN
INTRAMUSCULAR | Status: AC
  Filled 2012-07-05: qty 1500

## 2012-07-05 MED ORDER — SODIUM CHLORIDE 0.9 % IV SOLN: 250.0000 mL | INTRAVENOUS | Status: DC | PRN

## 2012-07-05 MED ORDER — SODIUM CHLORIDE 0.9 % IJ SOLN: 3.0000 mL | INTRAMUSCULAR | Status: DC | PRN

## 2012-07-05 MED ORDER — LIDOCAINE HCL (PF) 1 % IJ SOLN
INTRAMUSCULAR | Status: AC
  Filled 2012-07-05: qty 30

## 2012-07-05 MED ORDER — FENTANYL CITRATE 0.05 MG/ML IJ SOLN
INTRAMUSCULAR | Status: AC
  Filled 2012-07-05: qty 2

## 2012-07-05 MED ORDER — SODIUM CHLORIDE 0.9 % IJ SOLN: 3.0000 mL | Freq: Two times a day (BID) | INTRAMUSCULAR | Status: DC

## 2012-07-05 MED ORDER — SODIUM CHLORIDE 0.9 % IV SOLN: 1.0000 mL/kg/h | INTRAVENOUS | Status: DC

## 2012-07-05 NOTE — CV Procedure (Signed)
   Cardiac Catheterization Procedure Note  Name: Wayne Torres MRN: [id number] DOB: 1941/03/25  Procedure: Right Heart Cath, Left Heart Cath, Selective Coronary Angiography, saphenous vein graft angiography, IMA graft angiography, LV angiography  Indication: 72 year-old white male with history of coronary disease and prior coronary bypass surgery. Recent onset of dyspnea. Myoview study is abnormal showing inferior wall scar with some ischemia.   Procedural Details: The right groin was prepped, draped, and anesthetized with 1% lidocaine. Using the modified Seldinger technique a 5 French sheath was placed in the right femoral artery and a 7 French sheath was placed in the right femoral vein. A Swan-Ganz catheter was used for the right heart catheterization. Standard protocol was followed for recording of right heart pressures and sampling of oxygen saturations. Fick cardiac output was calculated. Standard Judkins catheters were used for selective coronary angiography and left ventriculography. There were no immediate procedural complications. The patient was transferred to the post catheterization recovery area for further monitoring.  Procedural Findings: Hemodynamics RA 7/7 with a mean of 5 mmHg RV 35/5 mmHg PA 36/9 with a mean of 22 mmHg PCWP 10/7 with a mean of 8 mmHg LV 103/11 mmHg AO 101/59 with a mean of 77 mmHg  Oxygen saturations: PA 68% AO 95%  Cardiac Output (Fick) 5.68 L per minute  Cardiac Index (Fick) 2.51 m/m per meter square   Coronary angiography: Coronary dominance: right  Left mainstem: 99% mid left main stenosis.  Left anterior descending (LAD): 100% occlusion at the ostium.  Left circumflex (LCx): 100% occlusion proximally.  Right coronary artery (RCA): The right coronary is a dominant vessel. It has 50-60% stenosis in the proximal vessel followed by 50-60% segmental disease in the mid vessel. The PL OM is occluded proximally. The PDA is without significant  disease. There are left to right and right to right collaterals to the PL OM.  The saphenous vein graft to the first diagonal is patent. There is 20-30% disease in the proximal vein graft. After the graft touchdown there is a 90% stenosis in the mid to distal diagonal. The vessel is very small in caliber in this location.  The saphenous vein graft to the obtuse marginal vessel is patent. There is 40-50% stenosis in the proximal vein graft. There is aneurysmal dilatation in the distal vein graft. The obtuse marginal vessel is fairly small.  The saphenous vein graft to the posterior lateral branch of the right coronary is occluded proximally.  The LIMA graft to the LAD is patent  Left ventriculography: Left ventricular systolic function is abnormal. Left ventricle is dilated with inferior wall akinesis and severe global hypokinesis. Ejection fraction is estimated at 30% there is no significant mitral regurgitation   Final Conclusions:   1. Severe three-vessel obstructive coronary disease. 2. Occluded saphenous vein graft to the PL OM. 3. Other grafts are patent including LIMA graft to the LAD, saphenous vein graft to the diagonal, and saphenous vein graft to the obtuse marginal vessel. 4. Severe left ventricular dysfunction. 5. Normal right heart pressures.  Recommendations: Recommend continued medical management. Compared to his prior study in 2010 there is no significant change in his coronary circulation. His LV function is decreased.   Collier Salina [facility] 07/05/2012, 9:24 AM

## 2012-07-05 NOTE — Interval H&P Note (Signed)
History and Physical Interval Note:  07/05/2012 8:27 AM  Wayne Torres  has presented today for surgery, with the diagnosis of critical nuc  The various methods of treatment have been discussed with the patient and family. After consideration of risks, benefits and other options for treatment, the patient has consented to  Procedure(s): LEFT AND RIGHT HEART CATHETERIZATION WITH CORONARY ANGIOGRAM (N/A) as a surgical intervention .  The patient's history has been reviewed, patient examined, no change in status, stable for surgery.  I have reviewed the patient's chart and labs.  Questions were answered to the patient's satisfaction.     Peter Martinique MD,FACC 07/05/2012 8:27 AM

## 2012-07-05 NOTE — H&P (View-Only) (Signed)
HPI Wayne Torres is a 72-year-old gentleman. He has a history of coronary artery disease. I last saw the patient back in May of last year. Last cardiac catheterization (11/2008) showed 100% left main RCA was diffusely diseased 50-60% proximal 50% mid. Vessel is occluded after the PDA, SVG RCA was occluded ostially, SVG to ramus was ectatic with 30% blockage in the midsection. SVG to diagonal was ectatic 40% lesion. Diagonal was patent. Upper branch had a 99% stenosis, but was a small vessel. There were left to right collaterals. Left subclavian tortuous. No flow-limiting plaque, LIMA to LAD was patent. LAD was small. Abdominal aortogram showed patent renal arteries. 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  He is s/p PPM for [facility].   No Known Allergies  Current Outpatient Prescriptions  Medication Sig Dispense Refill  . ALPRAZolam (XANAX) 0.25 MG tablet Take 1 tablet (0.25 mg total) by mouth daily as needed. anxiety  30 tablet  5  . amLODipine (NORVASC) 5 MG tablet Take 1 tablet (5 mg total) by mouth daily.  90 tablet  3  . aspirin EC 81 MG tablet Take 81 mg by mouth daily.      . Calcium Carbonate-Vit D-Min (CALCIUM 1200 PO) Take by mouth. 2 daily      . Cholecalciferol (VITAMIN D-3 PO) Take by mouth. 1 tab ndaily      . citalopram (CELEXA) 20 MG tablet Take 1 tablet (20 mg total) by mouth daily.  90 tablet  3  . Glucosamine-Chondroitin (OSTEO BI-FLEX REGULAR STRENGTH PO) Take by mouth. daily      . lisinopril (PRINIVIL,ZESTRIL) 10 MG tablet Take 10 mg by mouth daily.      . Multiple Vitamin (MULTIVITAMIN) tablet Take 1 tablet by mouth daily.      . mycophenolate (CELLCEPT) 500 MG tablet Take 1 tablet (500 mg total) by mouth 2 (two) times daily.  60 tablet  6  . nitroGLYCERIN (NITROSTAT) 0.4 MG SL tablet Place 0.4 mg under the tongue every 5 (five) minutes as needed. For chest pain      .  Omega-3 Fatty Acids (FISH OIL) 1200 MG CAPS Take 1 capsule by mouth daily.      . omeprazole (PRILOSEC) 40 MG capsule Take 1 capsule (40 mg total) by mouth daily.  90 capsule  3  . simvastatin (ZOCOR) 40 MG tablet Take 1 tablet (40 mg total) by mouth at bedtime.  90 tablet  3  . predniSONE (DELTASONE) 10 MG tablet       . sulfamethoxazole-trimethoprim (BACTRIM DS) 800-160 MG per tablet         Past Medical History  Diagnosis Date  . GI bleed   . Nausea and vomiting   . Abrasion of knee, left   . Shoulder joint pain   . Coronary artery disease s/p CABG 1990s     a. approx 1994 s/p CABG x 4 (LIMA->LAD, VG->RCA, VG->RI, VG->Diag);  b. 11/2008 Cath: LM 100, LAD small, RCA 50-60p, 100d, VG->RCA 100 ost, VG->RI 30m, VG->Diag 40 - upper branch of diag small, 99%, LIMA-LAD patent.;  c. 08/2011 Echo: EF 45-50%, mod dil LA,/RA/RV with mild-mod reduced RV fxn.  . Back pain   . Hip pain, right   . PSA (psoriatic arthritis)     increased  . Lumbar disc disease   . Malignant neoplasm of prostate   . Fatigue   . Pulmonary   fibrosis, postinflammatory     a. started on prednisone and cellcept 04/28/2012  . Renal calculus   . Osteoarthritis   . HTN (hypertension)   . Depression   . Anxiety   . ED (erectile dysfunction)   . Hyperlipidemia   . GERD (gastroesophageal reflux disease)   . Allergic rhinitis   . Peptic stricture of esophagus   . Complete heart block     a. s/p SJM Accent dc ppm, ser # [id number]  . Acute bronchitis 02/07/2012  . PVD (peripheral vascular disease)     a. 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  bilat ext iliac dzs.  . Pneumonia 1993    post-op    Past Surgical History  Procedure Date  . Coronary angioplasty 06/03/2007, 01/25/2009  . Nasal septum surgery april 2012    dr britt, [facility] - [place]  . Coronary artery bypass graft 20 yrs ago    LIMA to LAD, SVG to PDA, SVG to Ramus, SVG to D  . Pacemaker placement 08/2011    Family History    Problem Relation Age of Onset  . Heart attack Father   . Heart disease Father   . Colon cancer Neg Hx   . Cancer Neg Hx     Colon    History   Social History  . Marital Status: Married    Spouse Name: N/A    Number of Children: 2  . Years of Education: N/A   Occupational History  . Retired    Social History Main Topics  . Smoking status: Former Smoker -- 1.5 packs/day for 42 years    Types: Cigarettes    Quit date: 09/11/2001  . Smokeless tobacco: Never Used     Comment: Pt does not get regular exercise  . Alcohol Use: No  . Drug Use: No  . Sexually Active: Yes   Other Topics Concern  . 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    Review of Systems:  All systems reviewed.  They are negative to the above problem except as previously stated.  Vital Signs: BP 110/70  Pulse 77  Ht 6' (1.829 m)  Wt 229 lb 8 oz (104.101 kg)  BMI 31.13 kg/m2 O2 sat was 95 at rest  Decreased to 86% on RA with walking. Physical Exam Patient is in NAD HEENT:  Normocephalic, atraumatic. EOMI, PERRLA.  Neck: JVP is normal.  No bruits.  Lungs: clear to auscultation. No rales no wheezes.  Heart: Regular rate and rhythm. Normal S1, S2. No S3.   No significant murmurs. PMI not displaced.  Abdomen:  Supple, nontender. Normal bowel sounds. No masses. No hepatomegaly.  Extremities:   Good distal pulses throughout. No lower extremity edema.  Musculoskeletal :moving all extremities.  Neuro:   alert and oriented x3.  CN II-XII grossly intact.   Assessment and Plan:  1.  Dyspnea.  I am not convinced it is cardiac.  Today his oxygen dipped to 86% with walking  Was 95% at rest.  He says it did not do this in pulmonary clinic when he was there His volume status appears OK Will check labs.  Will set up for myoview to exclude ischemia though that should not cause dip in O2.   2.  CAD   As above.  3.  S/p  PPM      Patient underwent lexiscan myoview  This showed inferior infarct with periinfarct ischemia.  Defect was more pronounced than what reported previously.  LVEF in 30s.  I have reviewed recent echo and LVEF appears less than previously reported.  In low 30% range with wall motion abnormalities. I have reviewed with patient  With worsening symptoms that started fairly abruptly I would recomm R and L heart cath to define anatomy/pressures. He agrees.    Dorris Carnes

## 2012-07-08 ENCOUNTER — Other Ambulatory Visit: Payer: Self-pay | Admitting: Internal Medicine

## 2012-07-26 ENCOUNTER — Encounter: Payer: Medicare Other | Admitting: Nurse Practitioner

## 2012-07-29 ENCOUNTER — Encounter: Payer: Medicare Other | Admitting: Internal Medicine

## 2012-08-01 ENCOUNTER — Encounter: Payer: Medicare Other | Admitting: Nurse Practitioner

## 2012-08-01 ENCOUNTER — Telehealth: Payer: Self-pay | Admitting: Internal Medicine

## 2012-08-01 NOTE — Telephone Encounter (Signed)
Appt made to see Dr. Harrington Challenger 08/04/2012.

## 2012-08-01 NOTE — Telephone Encounter (Signed)
New Problem:    PAtient called in because he feels like his BP medication is wrong, has the feeling of being "drunk" as he walks around.  Also wanted to know if Dr. Harrington Challenger would want him to wait until 09/16/12 to be seen or if she would want him to come in and be seen sooner.  Patient also wanted to know if Dr. Harrington Challenger wanted to place him on a new medication and if so if we could go ahead and order it.  Please call back.

## 2012-08-04 ENCOUNTER — Encounter: Payer: Self-pay | Admitting: Internal Medicine

## 2012-08-04 ENCOUNTER — Ambulatory Visit (INDEPENDENT_AMBULATORY_CARE_PROVIDER_SITE_OTHER): Payer: Medicare Other | Admitting: Internal Medicine

## 2012-08-04 VITALS — BP 104/70 | HR 73 | Ht 72.0 in | Wt 230.0 lb

## 2012-08-04 DIAGNOSIS — I10 Essential (primary) hypertension: Secondary | ICD-10-CM

## 2012-08-04 MED ORDER — LISINOPRIL 10 MG PO TABS: 5.0000 mg | ORAL_TABLET | Freq: Every day | ORAL | Status: DC

## 2012-08-04 MED ORDER — AMLODIPINE BESYLATE 5 MG PO TABS: 2.5000 mg | ORAL_TABLET | Freq: Every day | ORAL | Status: DC

## 2012-08-04 MED ORDER — METOPROLOL SUCCINATE ER 25 MG PO TB24: 12.5000 mg | ORAL_TABLET | Freq: Two times a day (BID) | ORAL | Status: DC

## 2012-08-04 NOTE — Patient Instructions (Addendum)
Decrease Amlodipine to 2.61m daily (1/2 tablet).  Decrease Lisinopril to 565mdaily (1/2 tablet).  Start Toprol XL 2553m1/2 tablet twice daily.  Call us [place] in 1 week to let us [place] how you are feeling.

## 2012-08-04 NOTE — Progress Notes (Signed)
HPIMr. Wayne Torres is a 72 year old gentleman. He has a history of coronary artery disease. He had been complaineing of more SOB  I recomm a right/leaft heart cath  Hemodynamics  Showed RA 7; PA 36/9; PCWP 8  Cardiac index 2.81mmin/m2 Coronary dominance: right Left main: 99% mid  stenosis.  Left anterior descending (LAD): 100% occlusion at the ostium.  Left circumflex (LCx): 100% occlusion proximally.  Right coronary artery (RCA): The right coronary is a dominant vessel. It has 50-60% stenosis in the proximal vessel followed by 50-60% segmental disease in the mid vessel. The PL OM is occluded proximally. The PDA is without significant disease. There are left to right and right to right collaterals to the PL OM.  The saphenous vein graft to the first diagonal is patent. There is 20-30% disease in the proximal vein graft. After the graft touchdown there is a 90% stenosis in the mid to distal diagonal. The vessel is very small in caliber in this location.  The saphenous vein graft to the obtuse marginal vessel is patent. There is 40-50% stenosis in the proximal vein graft. There is aneurysmal dilatation in the distal vein graft. The obtuse marginal vessel is fairly small.  The saphenous vein graft to the posterior lateral branch of the right coronary is occluded proximally.  The LIMA graft to the LAD is patent  Left ventriculography: Left ventricular systolic function is abnormal. Left ventricle is dilated with inferior wall akinesis and severe global hypokinesis. Ejection fraction is estimated at 30% there is no significant mitral regurgitation  Final Conclusions:  1. Severe three-vessel obstructive coronary disease.  2. Occluded saphenous vein graft to the PL OM.  3. Other grafts are patent including LIMA graft to the LAD, saphenous vein graft to the diagonal, and saphenous vein graft to the obtuse marginal vessel.  4. Severe left ventricular dysfunction.  5. Normal right heart pressures.  Recommendations:  Recommend continued medical management. Compared to his prior study in 2010 there is no significant change in his coronary circulation. His LV function is decreased.   SInce the cath he says his breathing is some better.  No CP No Known Allergies  Current Outpatient Prescriptions  Medication Sig Dispense Refill  . ALPRAZolam (XANAX) 0.25 MG tablet Take 0.25 mg by mouth 3 (three) times daily as needed. For anxiety      . amLODipine (NORVASC) 5 MG tablet TAKE 1 TABLET BY MOUTH EVERY DAY  90 tablet  2  . aspirin EC 81 MG tablet Take 81 mg by mouth daily.      . Calcium Carbonate-Vit D-Min (CALCIUM 1200 PO) Take 1 tablet by mouth 2 (two) times daily.       . Cholecalciferol (VITAMIN D-3 PO) Take 1 tablet by mouth daily.       . citalopram (CELEXA) 20 MG tablet Take 1 tablet (20 mg total) by mouth daily.  90 tablet  3  . Glucosamine-Chondroitin (OSTEO BI-FLEX REGULAR STRENGTH PO) Take 1 tablet by mouth daily. daily      . lisinopril (PRINIVIL,ZESTRIL) 10 MG tablet Take 10 mg by mouth daily.      . Multiple Vitamin (MULTIVITAMIN) tablet Take 1 tablet by mouth daily.      . mycophenolate (CELLCEPT) 500 MG tablet Take 1 tablet (500 mg total) by mouth 2 (two) times daily.  60 tablet  6  . Omega-3 Fatty Acids (FISH OIL) 1200 MG CAPS Take 1 capsule by mouth daily.      .Marland Kitchenomeprazole (PRILOSEC) 40 MG  capsule Take 1 capsule (40 mg total) by mouth daily.  90 capsule  3  . simvastatin (ZOCOR) 40 MG tablet Take 1 tablet (40 mg total) by mouth at bedtime.  90 tablet  3  . sulfamethoxazole-trimethoprim (BACTRIM DS) 800-160 MG per tablet Take 1 tablet by mouth every Monday, Wednesday, and Friday. For about 2  Months, per pt. Start date unknown, duration unknown       No current facility-administered medications for this visit.    Past Medical History  Diagnosis Date  . GI bleed   . Nausea and vomiting   . Abrasion of knee, left   . Shoulder joint pain   . Coronary artery disease s/p CABG 1990s     a.  approx 1994 s/p CABG x 4 (LIMA->LAD, VG->RCA, VG->RI, VG->Diag);  b. 11/2008 Cath: LM 100, LAD small, RCA 50-60p, 100d, VG->RCA 100 ost, VG->RI 44m VG->Diag 40 - upper branch of diag small, 99%, LIMA-LAD patent.;  c. 08/2011 Echo: EF 45-50%, mod dil LA,/RA/RV with mild-mod reduced RV fxn.  . Back pain   . Hip pain, right   . PSA (psoriatic arthritis)     increased  . Lumbar disc disease   . Malignant neoplasm of prostate   . Fatigue   . Pulmonary fibrosis, postinflammatory     a. started on prednisone and cellcept 04/28/2012  . Renal calculus   . Osteoarthritis   . HTN (hypertension)   . Depression   . Anxiety   . ED (erectile dysfunction)   . Hyperlipidemia   . GERD (gastroesophageal reflux disease)   . Allergic rhinitis   . Peptic stricture of esophagus   . Complete heart block     a. s/p SJM Accent dc ppm, ser # [id number] . Acute bronchitis 02/07/2012  . PVD (peripheral vascular disease)     a. 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  bilat ext iliac dzs.  . Pneumonia 1993    post-op    Past Surgical History  Procedure Laterality Date  . Coronary angioplasty  06/03/2007, 01/25/2009  . Nasal septum surgery  april 2012    dr britt, [facility] [place]  . Coronary artery bypass graft  20 yrs ago    LIMA to LAD, SVG to PDA, SVG to Ramus, SVG to D  . Pacemaker placement  08/2011    Family History  Problem Relation Age of Onset  . Heart attack Father   . Heart disease Father   . Colon cancer Neg Hx   . Cancer Neg Hx     Colon    History   Social History  . Marital Status: Married    Spouse Name: N/A    Number of Children: 2  . Years of Education: N/A   Occupational History  . Retired    Social History Main Topics  . Smoking status: Former Smoker -- 1.50 packs/day for 42 years    Types: Cigarettes    Quit date: 09/11/2001  . Smokeless tobacco: Never Used     Comment: Pt does not get regular exercise  . Alcohol Use: No  . Drug Use:  No  . Sexually Active: Yes   Other Topics Concern  . 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    Review of Systems:  All systems reviewed.  They are  negative to the above problem except as previously stated.  Vital Signs: BP 104/70  Pulse 73  Ht 6' (1.829 m)  Wt 230 lb (104.327 kg)  BMI 31.19 kg/m2  SpO2 97%  Physical Exam Patient is in NAD Sats walking go down to 89/90% on RA. HEENT:  Normocephalic, atraumatic. EOMI, PERRLA.  Neck: JVP is normal.  No bruits.  Lungs: clear to auscultation. No rales no wheezes.  Heart: Regular rate and rhythm. Normal S1, S2. No S3.   No significant murmurs. PMI not displaced.  Abdomen:  Supple, nontender. Normal bowel sounds. No masses. No hepatomegaly.  Extremities:   Good distal pulses throughout. No lower extremity edema.  Musculoskeletal :moving all extremities.  Neuro:   alert and oriented x3.  CN II-XII grossly intact.   Assessment and Plan:  1.  CAD  Stable CAD  Would continue medical Rx.  2.  LV dysfunction>  Question if related to PPM  WIll review with Olin Pia  ? If BIV would help  3.  PUlm  Will forward results to pulmonary  Mild desaturation.  4.  HL  Continue statin.

## 2012-08-06 ENCOUNTER — Other Ambulatory Visit: Payer: Self-pay | Admitting: Internal Medicine

## 2012-08-11 ENCOUNTER — Telehealth: Payer: Self-pay | Admitting: Internal Medicine

## 2012-08-11 NOTE — Telephone Encounter (Signed)
Will forward to Dr. Harrington Challenger for review.

## 2012-08-11 NOTE — Telephone Encounter (Signed)
New problem       Pt want to give you his BP reading:Pt stated call him if you need to   08/05/12 10am 106/64 08/05/12 10pm 120/68 08/06/12 10am 133/76 08/06/12  8:30pm 126/73 08/07/12  10:45am  131/74 08/07/12  9:15pm 125/74 08/08/12  9am  126/76 08/08/12  8:30pm  129/74 08/09/12  10am  114/62 08/09/12  8:30pm 124/67 08/10/12  10:15am  130/79 08/10/12  9pm 142/87

## 2012-08-16 NOTE — Telephone Encounter (Signed)
BP is good.  No changes.

## 2012-08-18 NOTE — Telephone Encounter (Signed)
Pt.notified

## 2012-10-02 ENCOUNTER — Other Ambulatory Visit: Payer: Self-pay | Admitting: Internal Medicine

## 2012-10-13 ENCOUNTER — Encounter: Payer: Self-pay | Admitting: Critical Care Medicine

## 2012-10-13 ENCOUNTER — Ambulatory Visit (INDEPENDENT_AMBULATORY_CARE_PROVIDER_SITE_OTHER): Payer: Medicare Other | Admitting: Critical Care Medicine

## 2012-10-13 VITALS — BP 102/70 | HR 59 | Temp 97.7°F | Ht 72.0 in | Wt 232.0 lb

## 2012-10-13 DIAGNOSIS — Z5189 Encounter for other specified aftercare: Secondary | ICD-10-CM

## 2012-10-13 DIAGNOSIS — J8410 Pulmonary fibrosis, unspecified: Secondary | ICD-10-CM

## 2012-10-13 LAB — HEPATIC FUNCTION PANEL
AST: 21 U/L (ref 0–37)
Albumin: 4.2 g/dL (ref 3.5–5.2)
Alkaline Phosphatase: 73 U/L (ref 39–117)
Total Protein: 6.9 g/dL (ref 6.0–8.3)

## 2012-10-13 MED ORDER — MOMETASONE FUROATE 50 MCG/ACT NA SUSP: 2.0000 | Freq: Every day | NASAL | Status: DC

## 2012-10-13 NOTE — Assessment & Plan Note (Signed)
Idiopathic pulmonary fibrosis with no need for oxygen therapy but significant reduction in diffusion capacity documented November 2013. No biopsies have been obtained. The patient is now on empiric mycophenolate. There's been no real significant improvement according to the patient. The patient has springtime nasal allergies and is interested in nasal steroid.  Plan No change in medications Nasonex two puff each nostril daily Continue mycophenolate and we'll obtain lab studies to look for toxicity Will consider this patient for the pirfenidone trial

## 2012-10-13 NOTE — Progress Notes (Signed)
Subjective:    Patient ID: Wayne Torres, male    DOB: 1941/05/06, 72 y.o.   MRN: [id number]  HPI  10/13/2012 No recent spells of bronchitis.  Not much cough.  C/o nasal congestion.  No edema in feet.  No real chest pain. Pt denies any significant sore throat, nasal congestion or excess secretions, fever, chills, sweats, unintended weight loss, pleurtic or exertional chest pain, orthopnea PND, or leg swelling Pt denies any increase in rescue therapy over baseline, denies waking up needing it or having any early am or nocturnal exacerbations of coughing/wheezing/or dyspnea. Pt also denies any obvious fluctuation in symptoms with  weather or environmental change or other alleviating or aggravating factors  Past Medical History  Diagnosis Date  . GI bleed   . Nausea and vomiting   . Abrasion of knee, left   . Shoulder joint pain   . Coronary artery disease s/p CABG 1990s     a. approx 1994 s/p CABG x 4 (LIMA->LAD, VG->RCA, VG->RI, VG->Diag);  b. 11/2008 Cath: LM 100, LAD small, RCA 50-60p, 100d, VG->RCA 100 ost, VG->RI 76m VG->Diag 40 - upper branch of diag small, 99%, LIMA-LAD patent.;  c. 08/2011 Echo: EF 45-50%, mod dil LA,/RA/RV with mild-mod reduced RV fxn.  . Back pain   . Hip pain, right   . PSA (psoriatic arthritis)     increased  . Lumbar disc disease   . Malignant neoplasm of prostate   . Fatigue   . Pulmonary fibrosis, postinflammatory     a. started on prednisone and cellcept 04/28/2012  . Renal calculus   . Osteoarthritis   . HTN (hypertension)   . Depression   . Anxiety   . ED (erectile dysfunction)   . Hyperlipidemia   . GERD (gastroesophageal reflux disease)   . Allergic rhinitis   . Peptic stricture of esophagus   . Complete heart block     a. s/p SJM Accent dc ppm, ser # [id number] . Acute bronchitis 02/07/2012  . PVD (peripheral vascular disease)     a. 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  bilat ext iliac dzs.  .  Pneumonia 1993    post-op     Family History  Problem Relation Age of Onset  . Heart attack Father   . Heart disease Father   . Colon cancer Neg Hx   . Cancer Neg Hx     Colon     History   Social History  . Marital Status: Married    Spouse Name: N/A    Number of Children: 2  . Years of Education: N/A   Occupational History  . Retired    Social History Main Topics  . Smoking status: Former Smoker -- 1.50 packs/day for 42 years    Types: Cigarettes    Quit date: 09/11/2001  . Smokeless tobacco: Never Used     Comment: Pt does not get regular exercise  . Alcohol Use: No  . Drug Use: No  . Sexually Active: Yes   Other Topics Concern  . 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     No Known Allergies   Outpatient Prescriptions Prior to Visit  Medication Sig Dispense Refill  . ALPRAZolam (XANAX) 0.25 MG tablet Take 0.25 mg by mouth 3 (three) times daily as needed. For anxiety      .  amLODipine (NORVASC) 5 MG tablet Take 0.5 tablets (2.5 mg total) by mouth daily.  90 tablet  2  . aspirin EC 81 MG tablet Take 81 mg by mouth daily.      . Calcium Carbonate-Vit D-Min (CALCIUM 1200 PO) Take 1 tablet by mouth 2 (two) times daily.       . Cholecalciferol (VITAMIN D-3 PO) Take 1 tablet by mouth daily.       . citalopram (CELEXA) 20 MG tablet TAKE 1 TABLET BY MOUTH EVERY DAY  90 tablet  1  . Glucosamine-Chondroitin (OSTEO BI-FLEX REGULAR STRENGTH PO) Take 1 tablet by mouth daily. daily      . lisinopril (PRINIVIL,ZESTRIL) 10 MG tablet Take 0.5 tablets (5 mg total) by mouth daily.  90 tablet  3  . metoprolol succinate (TOPROL XL) 25 MG 24 hr tablet Take 0.5 tablets (12.5 mg total) by mouth 2 (two) times daily.  90 tablet  3  . Multiple Vitamin (MULTIVITAMIN) tablet Take 1 tablet by mouth daily.      . mycophenolate (CELLCEPT) 500 MG tablet Take 1 tablet (500  mg total) by mouth 2 (two) times daily.  60 tablet  6  . Omega-3 Fatty Acids (FISH OIL) 1200 MG CAPS Take 1 capsule by mouth daily.      Marland Kitchen omeprazole (PRILOSEC) 40 MG capsule Take 1 capsule (40 mg total) by mouth daily.  90 capsule  3  . simvastatin (ZOCOR) 40 MG tablet TAKE 1 TABLET BY MOUTH AT BEDTIME  90 tablet  2  . sulfamethoxazole-trimethoprim (BACTRIM DS) 800-160 MG per tablet Take 1 tablet by mouth every Monday, Wednesday, and Friday. For about 2  Months, per pt. Start date unknown, duration unknown       No facility-administered medications prior to visit.      Review of Systems  11 point review of systems taken in detail and is negative except as noted in history present illness    Objective:   Physical Exam  Filed Vitals:   10/13/12 1339  BP: 102/70  Pulse: 59  Temp: 97.7 F (36.5 C)  TempSrc: Oral  Height: 6' (1.829 m)  Weight: 232 lb (105.235 kg)  SpO2: 93%    Gen: Pleasant,  in no distress,  normal affect  ENT: No lesions,  mouth clear,  oropharynx clear, no postnasal drip  Neck: No JVD, no TMG, no carotid bruits  Lungs: No use of accessory muscles, no dullness to percussion, dry rales bibasilar   Cardiovascular: RRR, heart sounds normal, no murmur or gallops, no peripheral edema  Abdomen: soft and NT, no HSM,  BS normal  Musculoskeletal: No deformities, no cyanosis or clubbing  Neuro: alert, non focal  Skin: Warm, no lesions or rashes  No results found.    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 No biopsies have been obtained. The patient is now on empiric mycophenolate. There's been no real significant improvement according to the patient. The patient has springtime nasal allergies and  is interested in nasal steroid.  Plan No change in medications Nasonex two puff each nostril daily Continue mycophenolate and we'll obtain lab studies to look for toxicity Will consider this patient for the pirfenidone trial  Updated Medication List Outpatient Encounter Prescriptions as of 10/13/2012  Medication Sig Dispense Refill  . ALPRAZolam (XANAX) 0.25 MG tablet Take 0.25 mg by mouth 3 (three) times daily as needed. For anxiety      . amLODipine (NORVASC) 5 MG tablet Take 0.5 tablets (2.5 mg total) by mouth daily.  90 tablet  2  . aspirin EC 81 MG tablet Take 81 mg by mouth daily.      . Calcium Carbonate-Vit D-Min (CALCIUM 1200 PO) Take 1 tablet by mouth 2 (two) times daily.       . Cholecalciferol (VITAMIN D-3 PO) Take 1 tablet by mouth daily.       . citalopram (CELEXA) 20 MG tablet TAKE 1 TABLET BY MOUTH EVERY DAY  90 tablet  1  . Glucosamine-Chondroitin (OSTEO BI-FLEX REGULAR STRENGTH PO) Take 1 tablet by mouth daily. daily      . lisinopril (PRINIVIL,ZESTRIL) 10 MG tablet Take 0.5 tablets (5 mg total) by mouth daily.  90 tablet  3  . metoprolol succinate (TOPROL XL) 25 MG 24 hr tablet Take 0.5 tablets (12.5 mg total) by mouth 2 (two) times daily.  90 tablet  3  . Multiple Vitamin (MULTIVITAMIN) tablet Take 1 tablet by mouth daily.      . mycophenolate (CELLCEPT) 500 MG tablet Take 1 tablet (500 mg total) by mouth 2 (two) times daily.  60 tablet  6  . Omega-3 Fatty Acids (FISH OIL) 1200 MG CAPS Take 1 capsule by mouth daily.      Marland Kitchen omeprazole (PRILOSEC) 40 MG capsule Take 1 capsule (40 mg total) by mouth daily.  90 capsule  3  . simvastatin (ZOCOR) 40 MG tablet TAKE 1 TABLET BY MOUTH AT BEDTIME  90 tablet  2  . sulfamethoxazole-trimethoprim (BACTRIM DS) 800-160 MG per tablet Take 1 tablet by mouth every Monday, Wednesday, and Friday. For about 2  Months, per pt. Start date unknown, duration unknown      . mometasone (NASONEX) 50 MCG/ACT nasal spray Place 2 sprays into the nose  daily.  17 g  6   No facility-administered encounter medications on file as of 10/13/2012.

## 2012-10-13 NOTE — Patient Instructions (Addendum)
No change in medications Nasonex two puff each nostril daily Labs today We will call you about the pirfenidone trial Return 4 months

## 2012-10-14 LAB — CBC
MCHC: 34.1 g/dL (ref 30.0–36.0)
RDW: 13.4 % (ref 11.5–15.5)
WBC: 7.4 10*3/uL (ref 4.0–10.5)

## 2012-10-14 NOTE — Progress Notes (Signed)
Quick Note:  Called, spoke with family member. Was advised pt isn't available right now. She will ask he call back. ______

## 2012-10-14 NOTE — Progress Notes (Signed)
Quick Note:  Spoke with pt. Informed him of results and recs per Dr. Joya Gaskins. He verbalized understanding. ______

## 2012-10-14 NOTE — Progress Notes (Signed)
Quick Note:  Call pt and tell his labs are Normal. No change in medications ______

## 2012-10-19 ENCOUNTER — Other Ambulatory Visit: Payer: Self-pay | Admitting: Internal Medicine

## 2012-10-19 NOTE — Telephone Encounter (Signed)
Faxed hardcopy to [facility]

## 2012-10-19 NOTE — Telephone Encounter (Signed)
Done hardcopy to [facility]  

## 2012-10-21 ENCOUNTER — Telehealth: Payer: Self-pay | Admitting: Internal Medicine

## 2012-11-03 ENCOUNTER — Telehealth: Payer: Self-pay | Admitting: Critical Care Medicine

## 2012-11-03 NOTE — Telephone Encounter (Signed)
I spoke with Janette.  She has screened pt.  She will call him as soon as we hang up.  Will close msg.

## 2012-11-03 NOTE — Telephone Encounter (Signed)
Pt was seen on 10/13/12 with the following instructions:  Patient Instructions    No change in medications  Nasonex two puff each nostril daily  Labs today  We will call you about the pirfenidone trial  Return 4 months   -----  Spoke with pt.  States he hasn't heard anything about the pirfenidone trial yet.  Advised we would check on this and someone would call him back.  I called [phone], spoke with Arshena.  She will have Jenette call me back.

## 2012-11-07 ENCOUNTER — Other Ambulatory Visit: Payer: Self-pay | Admitting: Critical Care Medicine

## 2012-11-30 ENCOUNTER — Telehealth: Payer: Self-pay | Admitting: Critical Care Medicine

## 2012-11-30 NOTE — Telephone Encounter (Signed)
Per last OV note pt was to be called about the pirfenidone trial.  I called Jeanett Schlein and spoke with her. She stated she made pt aware he did not qualify for this medication and she stated she had made Dr. Joya Gaskins aware of this. I called pt and he stated he was not told this. He was just told she would speak with Dr. Joya Gaskins regarding this and get back in touch with him. Will forward to Dr. Joya Gaskins so he is aware pt called.

## 2012-11-30 NOTE — Telephone Encounter (Signed)
Was unaware of this.  Wayne Torres, can you call this pt since he was expecting something and tell him why he is not a candidate??

## 2012-12-12 NOTE — Telephone Encounter (Signed)
Wayne Torres spoke with the pt. Alamo Bing, CMA

## 2013-02-13 ENCOUNTER — Ambulatory Visit (INDEPENDENT_AMBULATORY_CARE_PROVIDER_SITE_OTHER): Payer: Medicare Other | Admitting: Critical Care Medicine

## 2013-02-13 ENCOUNTER — Encounter: Payer: Self-pay | Admitting: Critical Care Medicine

## 2013-02-13 VITALS — BP 110/74 | HR 62 | Temp 98.0°F | Ht 72.0 in | Wt 234.5 lb

## 2013-02-13 DIAGNOSIS — J8410 Pulmonary fibrosis, unspecified: Secondary | ICD-10-CM

## 2013-02-13 DIAGNOSIS — M25519 Pain in unspecified shoulder: Secondary | ICD-10-CM | POA: Insufficient documentation

## 2013-02-13 DIAGNOSIS — Z23 Encounter for immunization: Secondary | ICD-10-CM

## 2013-02-13 NOTE — Patient Instructions (Addendum)
Stop Cellcept (mycophenolate) and Bactrim A Flu vaccine and pneumovax was given No other medication changes Return 4 months

## 2013-02-13 NOTE — Assessment & Plan Note (Signed)
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

## 2013-02-13 NOTE — Progress Notes (Signed)
Subjective:    Patient ID: Wayne Torres, male    DOB: [birth year], 72 y.o.   MRN: [id number]  HPI  02/13/2013 Chief Complaint  Patient presents with  . 4 month follow up    Breathing is unchanged.  Is coughing more over the past few weeks with clear to milky mucus.  No wheezing, chest tightness, or chest pain.    Dyspnea same, notes last few weeks more cough. No sinus/pn drip.  No real chest pain.  No wheeze or tightness.  QTc too long for pt to get EAP pirfenidone trial.    Past Medical History  Diagnosis Date  . GI bleed   . Shoulder joint pain   . Coronary artery disease s/p CABG 1990s     a. approx 1994 s/p CABG x 4 (LIMA->LAD, VG->RCA, VG->RI, VG->Diag);  b. 11/2008 Cath: LM 100, LAD small, RCA 50-60p, 100d, VG->RCA 100 ost, VG->RI 30m VG->Diag 40 - upper branch of diag small, 99%, LIMA-LAD patent.;  c. 08/2011 Echo: EF 45-50%, mod dil LA,/RA/RV with mild-mod reduced RV fxn.  . Back pain   . Hip pain, right   . PSA (psoriatic arthritis)     increased  . Lumbar disc disease   . Malignant neoplasm of prostate   . Fatigue   . Pulmonary fibrosis, postinflammatory     Stopped Cellcept 02/13/13 d/t cost   . Renal calculus   . Osteoarthritis   . HTN (hypertension)   . Depression   . Anxiety   . ED (erectile dysfunction)   . Hyperlipidemia   . GERD (gastroesophageal reflux disease)   . Allergic rhinitis   . Peptic stricture of esophagus   . Complete heart block     a. s/p SJM Accent dc ppm, ser # [id number] . Chronic bronchitis 02/07/2012  . PVD (peripheral vascular disease)     a. 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  bilat ext iliac dzs.  . Pneumonia 1993    post-op     Family History  Problem Relation Age of Onset  . Heart attack Father   . Heart disease Father   . Colon cancer Neg Hx   . Cancer Neg Hx     Colon     History   Social History  . Marital Status: Married    Spouse Name: N/A    Number of Children: 2  . Years  of Education: N/A   Occupational History  . Retired    Social History Main Topics  . Smoking status: Former Smoker -- 1.50 packs/day for 42 years    Types: Cigarettes    Quit date: 09/11/2001  . Smokeless tobacco: Never Used     Comment: Pt does not get regular exercise  . Alcohol Use: No  . Drug Use: No  . Sexual Activity: Yes   Other Topics Concern  . 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     No Known Allergies   Outpatient Prescriptions Prior to Visit  Medication Sig Dispense Refill  . ALPRAZolam (XANAX) 0.25 MG tablet TAKE 1 TABLET BY MOUTH DAILY AS NEEDED FOR ANXIETY  90 tablet  2  . amLODipine (NORVASC) 5 MG tablet Take 0.5 tablets (2.5 mg total) by mouth daily.  90 tablet  2  . aspirin EC 81 MG tablet Take 81 mg  by mouth daily.      . Calcium Carbonate-Vit D-Min (CALCIUM 1200 PO) Take 1 tablet by mouth 2 (two) times daily.       . Cholecalciferol (VITAMIN D-3 PO) Take 1 tablet by mouth daily.       . citalopram (CELEXA) 20 MG tablet TAKE 1 TABLET BY MOUTH EVERY DAY  90 tablet  1  . Glucosamine-Chondroitin (OSTEO BI-FLEX REGULAR STRENGTH PO) Take 1 tablet by mouth daily. daily      . lisinopril (PRINIVIL,ZESTRIL) 10 MG tablet Take 0.5 tablets (5 mg total) by mouth daily.  90 tablet  3  . metoprolol succinate (TOPROL XL) 25 MG 24 hr tablet Take 0.5 tablets (12.5 mg total) by mouth 2 (two) times daily.  90 tablet  3  . Multiple Vitamin (MULTIVITAMIN) tablet Take 1 tablet by mouth daily.      . Omega-3 Fatty Acids (FISH OIL) 1200 MG CAPS Take 1 capsule by mouth daily.      Marland Kitchen omeprazole (PRILOSEC) 40 MG capsule TAKE ONE CAPSULE BY MOUTH EVERY DAY  90 capsule  1  . simvastatin (ZOCOR) 40 MG tablet TAKE 1 TABLET BY MOUTH AT BEDTIME  90 tablet  2  . mometasone (NASONEX) 50 MCG/ACT nasal spray Place 2 sprays into the nose daily.  17 g  6  . mycophenolate  (CELLCEPT) 500 MG tablet Take 1 tablet (500 mg total) by mouth 2 (two) times daily.  60 tablet  6  . sulfamethoxazole-trimethoprim (BACTRIM DS) 800-160 MG per tablet Take 1 tablet by mouth every Monday, Wednesday, and Friday. For about 2  Months, per pt. Start date unknown, duration unknown       No facility-administered medications prior to visit.      Review of Systems  11 point review of systems taken in detail and is negative except as noted in history present illness    Objective:   Physical Exam  Filed Vitals:   02/13/13 1517  BP: 110/74  Pulse: 62  Temp: 98 F (36.7 C)  TempSrc: Oral  Height: 6' (1.829 m)  Weight: 234 lb 8 oz (106.369 kg)  SpO2: 93%    Gen: Pleasant,  in no distress,  normal affect  ENT: No lesions,  mouth clear,  oropharynx clear, no postnasal drip  Neck: No JVD, no TMG, no carotid bruits  Lungs: No use of accessory muscles, no dullness to percussion, dry rales bibasilar   Cardiovascular: RRR, heart sounds normal, no murmur or gallops, no peripheral edema  Abdomen: soft and NT, no HSM,  BS normal  Musculoskeletal: No deformities, no cyanosis or clubbing  Neuro: alert, non focal  Skin: Warm, no lesions or rashes  No results found.    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 ALPRAZolam (XANAX) 0.25 MG tablet TAKE 1 TABLET BY MOUTH DAILY AS NEEDED FOR ANXIETY  90 tablet  2  . amLODipine (NORVASC) 5 MG tablet Take 0.5 tablets (2.5 mg total) by mouth  daily.  90 tablet  2  . aspirin EC 81 MG tablet Take 81 mg by mouth daily.      . Calcium Carbonate-Vit D-Min (CALCIUM 1200 PO) Take 1 tablet by mouth 2 (two) times daily.       . Cholecalciferol (VITAMIN D-3 PO) Take 1 tablet by mouth daily.       . citalopram (CELEXA) 20 MG tablet TAKE 1 TABLET BY MOUTH EVERY DAY  90 tablet  1  . Glucosamine-Chondroitin (OSTEO BI-FLEX REGULAR STRENGTH PO) Take 1 tablet by mouth daily. daily      . lisinopril (PRINIVIL,ZESTRIL) 10 MG tablet Take 0.5 tablets (5 mg total) by mouth daily.  90 tablet  3  . metoprolol succinate (TOPROL XL) 25 MG 24 hr tablet Take 0.5 tablets (12.5 mg total) by mouth 2 (two) times daily.  90 tablet  3  . Multiple Vitamin (MULTIVITAMIN) tablet Take 1 tablet by mouth daily.      . Omega-3 Fatty Acids (FISH OIL) 1200 MG CAPS Take 1 capsule by mouth daily.      Marland Kitchen omeprazole (PRILOSEC) 40 MG capsule TAKE ONE CAPSULE BY MOUTH EVERY DAY  90 capsule  1  . simvastatin (ZOCOR) 40 MG tablet TAKE 1 TABLET BY MOUTH AT BEDTIME  90 tablet  2  . mometasone (NASONEX) 50 MCG/ACT nasal spray Place 2 sprays into the nose daily.  17 g  6  . [DISCONTINUED] mycophenolate (CELLCEPT) 500 MG tablet Take 1 tablet (500 mg total) by mouth 2 (two) times daily.  60 tablet  6  . [DISCONTINUED] sulfamethoxazole-trimethoprim (BACTRIM DS) 800-160 MG per tablet Take 1 tablet by mouth every Monday, Wednesday, and Friday. For about 2  Months, per pt. Start date unknown, duration unknown       No facility-administered encounter medications on file as of 02/13/2013.

## 2013-02-28 ENCOUNTER — Ambulatory Visit (INDEPENDENT_AMBULATORY_CARE_PROVIDER_SITE_OTHER): Payer: Medicare Other | Admitting: Internal Medicine

## 2013-02-28 ENCOUNTER — Other Ambulatory Visit (INDEPENDENT_AMBULATORY_CARE_PROVIDER_SITE_OTHER): Payer: Medicare Other

## 2013-02-28 ENCOUNTER — Encounter: Payer: Self-pay | Admitting: Internal Medicine

## 2013-02-28 VITALS — BP 108/68 | HR 60 | Temp 97.1°F | Ht 72.0 in | Wt 236.0 lb

## 2013-02-28 DIAGNOSIS — I10 Essential (primary) hypertension: Secondary | ICD-10-CM

## 2013-02-28 DIAGNOSIS — R310 Gross hematuria: Secondary | ICD-10-CM | POA: Insufficient documentation

## 2013-02-28 DIAGNOSIS — M25551 Pain in right hip: Secondary | ICD-10-CM

## 2013-02-28 DIAGNOSIS — E785 Hyperlipidemia, unspecified: Secondary | ICD-10-CM

## 2013-02-28 DIAGNOSIS — R972 Elevated prostate specific antigen [PSA]: Secondary | ICD-10-CM

## 2013-02-28 DIAGNOSIS — M25559 Pain in unspecified hip: Secondary | ICD-10-CM

## 2013-02-28 DIAGNOSIS — J309 Allergic rhinitis, unspecified: Secondary | ICD-10-CM | POA: Insufficient documentation

## 2013-02-28 LAB — URINALYSIS, ROUTINE W REFLEX MICROSCOPIC
Bilirubin Urine: NEGATIVE
Hgb urine dipstick: NEGATIVE
Leukocytes, UA: NEGATIVE
Nitrite: NEGATIVE
Total Protein, Urine: NEGATIVE
Urobilinogen, UA: 0.2 (ref 0.0–1.0)
pH: 6 (ref 5.0–8.0)

## 2013-02-28 LAB — CBC WITH DIFFERENTIAL/PLATELET
Basophils Absolute: 0 10*3/uL (ref 0.0–0.1)
Basophils Relative: 0.3 % (ref 0.0–3.0)
Eosinophils Absolute: 0.5 10*3/uL (ref 0.0–0.7)
Eosinophils Relative: 5.4 % — ABNORMAL HIGH (ref 0.0–5.0)
Hemoglobin: 15.1 g/dL (ref 13.0–17.0)
Lymphocytes Relative: 19.4 % (ref 12.0–46.0)
MCHC: 34.3 g/dL (ref 30.0–36.0)
MCV: 89.5 fl (ref 78.0–100.0)
Monocytes Absolute: 0.8 10*3/uL (ref 0.1–1.0)
Neutrophils Relative %: 65.3 % (ref 43.0–77.0)
Platelets: 272 10*3/uL (ref 150.0–400.0)
RBC: 4.91 Mil/uL (ref 4.22–5.81)
WBC: 8.7 10*3/uL (ref 4.5–10.5)

## 2013-02-28 LAB — HEPATIC FUNCTION PANEL
ALT: 14 U/L (ref 0–53)
AST: 21 U/L (ref 0–37)
Albumin: 3.9 g/dL (ref 3.5–5.2)
Total Bilirubin: 0.8 mg/dL (ref 0.3–1.2)
Total Protein: 6.8 g/dL (ref 6.0–8.3)

## 2013-02-28 LAB — BASIC METABOLIC PANEL
Calcium: 9.1 mg/dL (ref 8.4–10.5)
Creatinine, Ser: 1 mg/dL (ref 0.4–1.5)
GFR: 77.41 mL/min (ref >60.00)
Sodium: 139 mEq/L (ref 135–145)

## 2013-02-28 LAB — LIPID PANEL
Cholesterol: 97 mg/dL (ref 0–200)
HDL: 25.4 mg/dL — ABNORMAL LOW (ref >39.00)
LDL Cholesterol: 45 mg/dL (ref 0–99)
Total CHOL/HDL Ratio: 4
Triglycerides: 135 mg/dL (ref 0.0–149.0)
VLDL: 27 mg/dL (ref 0.0–40.0)

## 2013-02-28 LAB — PSA: PSA: 1.73 ng/mL (ref 0.10–4.00)

## 2013-02-28 LAB — TSH: TSH: 0.9 u[IU]/mL (ref 0.35–5.50)

## 2013-02-28 MED ORDER — FLUTICASONE PROPIONATE 50 MCG/ACT NA SUSP: 2.0000 | Freq: Every day | NASAL | Status: DC

## 2013-02-28 NOTE — Assessment & Plan Note (Signed)
stable overall by history and exam, recent data reviewed with pt, and pt to continue medical treatment as before,  to f/u any worsening symptoms or concerns;  Lab Results  Component Value Date   LDLCALC 55 04/08/2012

## 2013-02-28 NOTE — Progress Notes (Signed)
Subjective:    Patient ID: Wayne Torres, male    DOB: [birth year], 72 y.o.   MRN: [id number]  HPI   Here with one episode transient BRB with urination after working in the yard 3 days ago, some transient mild lower mid abd pain as well, but no fever, or blood since then after he increased his fluid intake.  Has hx of renal stones. Does have several wks ongoing nasal allergy symptoms with clearish congestion, itch and sneezing, without fever, pain, ST, cough, swelling or wheezing, asks for flonase tx.  Also with ongoing right hip pain, asks for steroid shot that helped before. Stopped smoking x 10 yrs. Pt denies chest pain, increased sob or doe, wheezing, orthopnea, PND, increased LE swelling, palpitations, dizziness or syncope.  Pt denies polydipsia, polyuria, or low sugar symptoms such as weakness or confusion improved with po intake.  Pt states overall good compliance with meds, trying to follow lower cholesterol diet, wt overall stable but little exercise however.     Past Medical History  Diagnosis Date  . GI bleed   . Shoulder joint pain   . Coronary artery disease s/p CABG 1990s     a. approx 1994 s/p CABG x 4 (LIMA->LAD, VG->RCA, VG->RI, VG->Diag);  b. 11/2008 Cath: LM 100, LAD small, RCA 50-60p, 100d, VG->RCA 100 ost, VG->RI 33m VG->Diag 40 - upper branch of diag small, 99%, LIMA-LAD patent.;  c. 08/2011 Echo: EF 45-50%, mod dil LA,/RA/RV with mild-mod reduced RV fxn.  . Back pain   . Hip pain, right   . PSA (psoriatic arthritis)     increased  . Lumbar disc disease   . Malignant neoplasm of prostate   . Fatigue   . Pulmonary fibrosis, postinflammatory     Stopped Cellcept 02/13/13 d/t cost   . Renal calculus   . Osteoarthritis   . HTN (hypertension)   . Depression   . Anxiety   . ED (erectile dysfunction)   . Hyperlipidemia   . GERD (gastroesophageal reflux disease)   . Allergic rhinitis   . Peptic stricture of esophagus   . Complete heart block     a. s/p SJM Accent dc ppm,  ser # [id number] . Chronic bronchitis 02/07/2012  . PVD (peripheral vascular disease)     a. 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  bilat ext iliac dzs.  . Pneumonia 1993    post-op  . Allergic rhinitis, cause unspecified 02/28/2013   Past Surgical History  Procedure Laterality Date  . Coronary angioplasty  06/03/2007, 01/25/2009  . Nasal septum surgery  april 2012    dr britt, [facility] [place]  . Coronary artery bypass graft  20 yrs ago    LIMA to LAD, SVG to PDA, SVG to Ramus, SVG to D  . Pacemaker placement  08/2011    reports that he quit smoking about 11 years ago. His smoking use included Cigarettes. He has a 63 pack-year smoking history. He has never used smokeless tobacco. He reports that he does not drink alcohol or use illicit drugs. family history includes Heart attack in his father; Heart disease in his father. There is no history of Colon cancer or Cancer. No Known Allergies Current Outpatient Prescriptions on File Prior to Visit  Medication Sig Dispense Refill  . ALPRAZolam (XANAX) 0.25 MG tablet TAKE 1 TABLET BY MOUTH DAILY AS NEEDED FOR ANXIETY  90 tablet  2  . amLODipine (NORVASC) 5 MG tablet  Take 0.5 tablets (2.5 mg total) by mouth daily.  90 tablet  2  . aspirin EC 81 MG tablet Take 81 mg by mouth daily.      . Calcium Carbonate-Vit D-Min (CALCIUM 1200 PO) Take 1 tablet by mouth 2 (two) times daily.       . Cholecalciferol (VITAMIN D-3 PO) Take 1 tablet by mouth daily.       . citalopram (CELEXA) 20 MG tablet TAKE 1 TABLET BY MOUTH EVERY DAY  90 tablet  1  . Glucosamine-Chondroitin (OSTEO BI-FLEX REGULAR STRENGTH PO) Take 1 tablet by mouth daily. daily      . lisinopril (PRINIVIL,ZESTRIL) 10 MG tablet Take 0.5 tablets (5 mg total) by mouth daily.  90 tablet  3  . metoprolol succinate (TOPROL XL) 25 MG 24 hr tablet Take 0.5 tablets (12.5 mg total) by mouth 2 (two) times daily.  90 tablet  3  . Multiple Vitamin (MULTIVITAMIN) tablet Take 1  tablet by mouth daily.      . Omega-3 Fatty Acids (FISH OIL) 1200 MG CAPS Take 1 capsule by mouth daily.      Marland Kitchen omeprazole (PRILOSEC) 40 MG capsule TAKE ONE CAPSULE BY MOUTH EVERY DAY  90 capsule  1  . simvastatin (ZOCOR) 40 MG tablet TAKE 1 TABLET BY MOUTH AT BEDTIME  90 tablet  2   No current facility-administered medications on file prior to visit.    Review of Systems  Constitutional: Negative for unexpected weight change, or unusual diaphoresis  HENT: Negative for tinnitus.   Eyes: Negative for photophobia and visual disturbance.  Respiratory: Negative for choking and stridor.   Gastrointestinal: Negative for vomiting and blood in stool.  Genitourinary: Negative for hematuria and decreased urine volume.  Musculoskeletal: Negative for acute joint swelling Skin: Negative for color change and wound.  Neurological: Negative for tremors and numbness other than noted  Psychiatric/Behavioral: Negative for decreased concentration or  hyperactivity.       Objective:   Physical Exam BP 108/68  Pulse 60  Temp(Src) 97.1 F (36.2 C) (Oral)  Ht 6' (1.829 m)  Wt 236 lb (107.049 kg)  BMI 32 kg/m2  SpO2 95% VS noted,  Constitutional: Pt appears well-developed and well-nourished.  HENT: Head: NCAT.  Right Ear: External ear normal.  Left Ear: External ear normal.  Bilat tm's with mild erythema.  Max sinus areas non tender.  Pharynx with mild erythema, no exudate Eyes: Conjunctivae and EOM are normal. Pupils are equal, round, and reactive to light.  Neck: Normal range of motion. Neck supple.  Cardiovascular: Normal rate and regular rhythm.   Pulmonary/Chest: Effort normal and breath sounds normal.  Abd:  Soft, NT, non-distended, + BS Neurological: Pt is alert. Not confused  Skin: Skin is warm. No erythema.  Psychiatric: Pt behavior is normal. Thought content normal.     Assessment & Plan:

## 2013-02-28 NOTE — Assessment & Plan Note (Signed)
Also for f/u psa as he is due

## 2013-02-28 NOTE — Assessment & Plan Note (Signed)
nasonex too expensive, for flonase asd

## 2013-02-28 NOTE — Assessment & Plan Note (Signed)
For sports med referral - Dr Tamala Julian in our office

## 2013-02-28 NOTE — Assessment & Plan Note (Signed)
Transient episode x 1 three days ago without recurrence, with mild transient lower abd discomfort now resolved as well.  No fever, but will ask for UA and cx, needs CT - r/o recurrent stone, and refer urology, may also need cysto

## 2013-02-28 NOTE — Patient Instructions (Signed)
Please take all new medication as prescribed- the flonase for allergies Please continue all other medications as before Please have the pharmacy call with any other refills you may need.  You will be contacted regarding the referral for: CT abd/pelvis to see if you have renal stone, as well as urology referral  Please go to the LAB in the Basement (turn left off the elevator) for the tests to be done today  You will be contacted by phone if any changes need to be made immediately.  Otherwise, you will receive a letter about your results with an explanation, but please check with MyChart first.  Please keep your appointments with your specialists as you have planned - cardiology  Please return in 6 months, or sooner if needed

## 2013-02-28 NOTE — Assessment & Plan Note (Signed)
stable overall by history and exam, recent data reviewed with pt, and pt to continue medical treatment as before,  to f/u any worsening symptoms or concerns BP Readings from Last 3 Encounters:  02/28/13 108/68  02/13/13 110/74  10/13/12 102/70

## 2013-03-01 ENCOUNTER — Ambulatory Visit (INDEPENDENT_AMBULATORY_CARE_PROVIDER_SITE_OTHER)
Admission: RE | Admit: 2013-03-01 | Discharge: 2013-03-01 | Disposition: A | Discharge status: Home / Self Care | Payer: Medicare Other | Source: Ambulatory Visit | Attending: Internal Medicine | Admitting: Internal Medicine

## 2013-03-01 DIAGNOSIS — R310 Gross hematuria: Secondary | ICD-10-CM

## 2013-03-01 LAB — URINE CULTURE: Colony Count: 7000

## 2013-03-10 ENCOUNTER — Telehealth: Payer: Self-pay | Admitting: Critical Care Medicine

## 2013-03-10 ENCOUNTER — Telehealth: Payer: Self-pay | Admitting: Internal Medicine

## 2013-03-10 DIAGNOSIS — M25559 Pain in unspecified hip: Secondary | ICD-10-CM

## 2013-03-10 DIAGNOSIS — N400 Enlarged prostate without lower urinary tract symptoms: Secondary | ICD-10-CM | POA: Insufficient documentation

## 2013-03-10 DIAGNOSIS — N281 Cyst of kidney, acquired: Secondary | ICD-10-CM | POA: Insufficient documentation

## 2013-03-10 DIAGNOSIS — N210 Calculus in bladder: Secondary | ICD-10-CM | POA: Insufficient documentation

## 2013-03-10 NOTE — Telephone Encounter (Signed)
Either way would work, but I did the referral anyway

## 2013-03-10 NOTE — Telephone Encounter (Signed)
Attempted to call pt but no answer and no machine.

## 2013-03-10 NOTE — Telephone Encounter (Signed)
03/10/2013   Pt called in stating that during his last office visit, the doctor said that he was going to put in a referral for him to see Dr. 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  Pt wants to know if he needs a referral or if he can just schedule.

## 2013-03-13 NOTE — Telephone Encounter (Signed)
LMOMTCB for pt.  What kind of surgery or procedure will be done for stones, etc.?

## 2013-03-13 NOTE — Telephone Encounter (Signed)
Pt is aware that he is cleared for surgery.  Hold this message in Triage so that we can call the dr's office in the morning.

## 2013-03-13 NOTE — Telephone Encounter (Signed)
Spoke with pt.  He was seen by Dr. Hoyle Barr, Urologist with [facility].  Pt was informed he has stones in his bladder, will need surgery for this, and will need pulmonary clearance.  He is unsure what type of surgery will be done and is asking I call Dr. Cathlean Sauer office for this information at [phone].  Called Dr. Cathlean Sauer office, spoke with Myriam Jacobson.  Was advised pt needs a cysto cystolithalopaxies with cyber wand under general anesthesia.  Pulmonary Clearance will need to be faxed to [phone].  Myriam Jacobson would like call back once PW has given ok or not.    Dr. Joya Gaskins, pls advise.  Thank you.  Last OV with PW: 02/13/13.

## 2013-03-13 NOTE — Telephone Encounter (Signed)
He can be cleared for surgery, but suggest be done as inpatient .

## 2013-03-14 ENCOUNTER — Encounter: Payer: Self-pay | Admitting: *Deleted

## 2013-03-14 NOTE — Telephone Encounter (Signed)
Pt called in reference to earlier messages.  He is requesting Dr Sherryll Burger release him from the hospital after his procedure @ [facility].  He requested we get this message to Henriette

## 2013-03-14 NOTE — Telephone Encounter (Signed)
Spoke with Darlene with Dr Cathlean Sauer office Letter clearing the pt for surgery faxed to her at [phone] Pt aware and states nothing further needed

## 2013-03-21 ENCOUNTER — Other Ambulatory Visit (INDEPENDENT_AMBULATORY_CARE_PROVIDER_SITE_OTHER): Payer: Medicare Other

## 2013-03-21 ENCOUNTER — Ambulatory Visit (INDEPENDENT_AMBULATORY_CARE_PROVIDER_SITE_OTHER): Payer: Medicare Other | Admitting: Family Medicine

## 2013-03-21 ENCOUNTER — Encounter: Payer: Self-pay | Admitting: Family Medicine

## 2013-03-21 VITALS — BP 112/80 | HR 64 | Wt 235.0 lb

## 2013-03-21 DIAGNOSIS — M76899 Other specified enthesopathies of unspecified lower limb, excluding foot: Secondary | ICD-10-CM

## 2013-03-21 DIAGNOSIS — M7061 Trochanteric bursitis, right hip: Secondary | ICD-10-CM

## 2013-03-21 DIAGNOSIS — M25559 Pain in unspecified hip: Secondary | ICD-10-CM

## 2013-03-21 DIAGNOSIS — M25551 Pain in right hip: Secondary | ICD-10-CM

## 2013-03-21 MED ORDER — MELOXICAM 15 MG PO TABS: 15.0000 mg | ORAL_TABLET | Freq: Every day | ORAL | Status: DC

## 2013-03-21 NOTE — Progress Notes (Signed)
I'm seeing this patient by the request  of:  Cathlean Cower, MD   CC: Right hip pain  HPI: Patient is a pleasant 72 year old male coming in with right hip pain. Patient does have a past medical history significant for lumbar disc degeneration and osteoarthritis. Patient states he has had this pain for possibly multiple years. Patient states that it is worse when he is walking or walking upstairs or he'll. Patient states it is a lateral hip pain. Patient denies any groin pain. Patient denies any radiation or any numbness or any weakness in the lower extremity. Patient does have a history of back pain but states that this is significantly different. Patient describes it more as a discomfort than true pain but it does stop him from walking long distances. Patient states it's relieved with rest. Patient puts the pain severity at 5/10.   Past medical, surgical, family and social history reviewed. Medications reviewed all in the electronic medical record.   Review of Systems: No headache, visual changes, nausea, vomiting, diarrhea, constipation, dizziness, abdominal pain, skin rash, fevers, chills, night sweats, weight loss, swollen lymph nodes, body aches, joint swelling, muscle aches, chest pain, shortness of breath, mood changes.   Objective:    Blood pressure 112/80, pulse 64, weight 235 lb (106.595 kg), SpO2 93.00%.   General: No apparent distress alert and oriented x3 mood and affect normal, dressed appropriately.  HEENT: Pupils equal, extraocular movements intact Respiratory: Patient's speak in full sentences and does not appear short of breath Cardiovascular: No lower extremity edema, non tender, no erythema Skin: Warm dry intact with no signs of infection or rash on extremities or on axial skeleton. Abdomen: Soft nontender Neuro: Cranial nerves II through XII are intact, neurovascularly intact in all extremities with 2+ DTRs and 2+ pulses. Lymph: No lymphadenopathy of posterior or anterior  cervical chain or axillae bilaterally.  Gait normal with good balance and coordination.  MSK: Non tender with full range of motion and good stability and symmetric strength and tone of shoulders, elbows, wrist, , knee and ankles bilaterally.  Hip: right ROM IR: 40 Deg, ER: 45 Deg, Flexion: 120 Deg, Extension: 100 Deg, Abduction: 45 Deg, Adduction: 45 Deg Strength IR: 5/5, ER: 5/5, Flexion: 5/5, Extension: 5/5, Abduction: 4/5, Adduction: 5/5 Pelvic alignment unremarkable to inspection and palpation. Standing hip rotation and gait without trendelenburg sign / unsteadiness. Greater trochanter severe tenderness to palpation. No tenderness over piriformis. Positive FABER negative. FADIR. No SI joint tenderness and normal minimal SI movement. Contralateral hip unremarkable  Limited muscular skeletal ultrasound was performed and interpreted by Hulan Saas, M  Lateral hip ultrasound shows the patient does have a chronic greater trochanteric bursa but does have mild calcification. There also appears to be a very small spurring in this area on the lateral hip. There is hypoechoic changes secondary to swelling. Mild increase and outflow. No true tear appreciated in the IT band or in the tensor fascia lata. The gluteus medius tendon is unremarkable   After verbal consent patient was prepped in sterile fashion with alcohol swabs. Ethyl chloride used patient was injected with a 22-gauge 3 inch needle into the RIGHT lateral hip in the greater trochanteric area under ultrasound guidance. Picture was taken. Patient had 4 cc of 0.5% Marcaine and 1 cc of Kenalog 40 mg/dL injected. Patient tolerated the procedure well and no blood loss. Pain completely resolved after injection stating proper placement. Post injection instructions given.    Impression and Recommendations:     This  case required medical decision making of moderate complexity.

## 2013-03-21 NOTE — Patient Instructions (Addendum)
You have greater trochanteric bursitis with IT band syndrome.  Meloxicam daily for 10 days.  Try the exercises daily Also icing 20 minutes can be helpful.  Hip Abductors-  Lay on side, lift top leg 6 inches off ground, hold for 2 seconds, down four count of four.  1 set 30 reps for 1 week, then 2 sets daily then 3 sets daily therafter.  Come back and see me in 3-4 weeks if still having pain.

## 2013-03-21 NOTE — Assessment & Plan Note (Signed)
Injection as stated above Home exercise program given Discussed anatomy, physiology in our rehabilitation can help prognosis. Discussed the importance of weight loss as well as hip abductor strengthening. Will do short burst of anti-inflammatories Discussed icing protocol Return in 4 weeks, may need to repeat injection if recurs again and we'll do ultrasound at that time.

## 2013-03-24 ENCOUNTER — Ambulatory Visit: Payer: Medicare Other | Admitting: Internal Medicine

## 2013-03-29 ENCOUNTER — Other Ambulatory Visit: Payer: Self-pay | Admitting: Internal Medicine

## 2013-03-31 ENCOUNTER — Encounter: Payer: Self-pay | Admitting: Internal Medicine

## 2013-03-31 ENCOUNTER — Ambulatory Visit (INDEPENDENT_AMBULATORY_CARE_PROVIDER_SITE_OTHER): Payer: Medicare Other | Admitting: Internal Medicine

## 2013-03-31 VITALS — BP 124/74 | HR 95 | Ht 72.0 in | Wt 231.0 lb

## 2013-03-31 DIAGNOSIS — J84112 Idiopathic pulmonary fibrosis: Secondary | ICD-10-CM

## 2013-03-31 DIAGNOSIS — I5022 Chronic systolic (congestive) heart failure: Secondary | ICD-10-CM

## 2013-03-31 DIAGNOSIS — I25810 Atherosclerosis of coronary artery bypass graft(s) without angina pectoris: Secondary | ICD-10-CM

## 2013-03-31 DIAGNOSIS — E785 Hyperlipidemia, unspecified: Secondary | ICD-10-CM

## 2013-03-31 DIAGNOSIS — I509 Heart failure, unspecified: Secondary | ICD-10-CM

## 2013-03-31 NOTE — Patient Instructions (Signed)
Your physician wants you to follow-up in: 9 months You will receive a reminder letter in the mail two months in advance. If you don't receive a letter, please call our office to schedule the follow-up appointment.

## 2013-03-31 NOTE — Progress Notes (Signed)
Wayne Torres is a 72 year old gentleman. He has a history of coronary artery disease.I saw him last winter.  Last winter he had been complaineing of more SOB  I recomm a right/leaft heart cath  Hemodynamics  Showed RA 7; PA 36/9; PCWP 8  Cardiac index 2.40mmin/m2 Coronary dominance: right Left main: 99% mid  stenosis.  Left anterior descending (LAD): 100% occlusion at the ostium.  Left circumflex (LCx): 100% occlusion proximally.  Right coronary artery (RCA): The right coronary is a dominant vessel. It has 50-60% stenosis in the proximal vessel followed by 50-60% segmental disease in the mid vessel. The PL OM is occluded proximally. The PDA is without significant disease. There are left to right and right to right collaterals to the PL OM.  The saphenous vein graft to the first diagonal is patent. There is 20-30% disease in the proximal vein graft. After the graft touchdown there is a 90% stenosis in the mid to distal diagonal. The vessel is very small in caliber in this location.  The saphenous vein graft to the obtuse marginal vessel is patent. There is 40-50% stenosis in the proximal vein graft. There is aneurysmal dilatation in the distal vein graft. The obtuse marginal vessel is fairly small.  The saphenous vein graft to the posterior lateral branch of the right coronary is occluded proximally.  The LIMA graft to the LAD is patent  Left ventriculography: Left ventricular systolic function is abnormal. Left ventricle is dilated with inferior wall akinesis and severe global hypokinesis. Ejection fraction is estimated at 30% there is no significant mitral regurgitation   Compared to his prior study in 2010 there is no significant change in his coronary circulation. His LV function is decreased. Plan was for continued medical Rx  When I saw him last he was very SOB and sats were marginal.  Seen in pulm  Put on Cellcept  No real response  But the patient does report his sats at home in 90s not 80s  He  currently says his breathing is OK    He denies CP  No edema   No Known Allergies  Current Outpatient Prescriptions  Medication Sig Dispense Refill  . ALPRAZolam (XANAX) 0.25 MG tablet TAKE 1 TABLET BY MOUTH DAILY AS NEEDED FOR ANXIETY  90 tablet  2  . amLODipine (NORVASC) 5 MG tablet Take 0.5 tablets (2.5 mg total) by mouth daily.  90 tablet  2  . aspirin EC 81 MG tablet Take 81 mg by mouth daily.      . Cholecalciferol (VITAMIN D-3 PO) Take by mouth.      . citalopram (CELEXA) 20 MG tablet TAKE 1 TABLET BY MOUTH EVERY DAY  90 tablet  1  . finasteride (PROSCAR) 5 MG tablet Take 5 mg by mouth daily.       .Marland Kitchenlisinopril (PRINIVIL,ZESTRIL) 10 MG tablet Take 0.5 tablets (5 mg total) by mouth daily.  90 tablet  3  . meloxicam (MOBIC) 15 MG tablet Take 1 tablet (15 mg total) by mouth daily.  30 tablet  0  . metoprolol succinate (TOPROL XL) 25 MG 24 hr tablet Take 0.5 tablets (12.5 mg total) by mouth 2 (two) times daily.  90 tablet  3  . omeprazole (PRILOSEC) 40 MG capsule TAKE 1 CAPSULE BY MOUTH DAILY  90 capsule  3  . simvastatin (ZOCOR) 40 MG tablet TAKE 1 TABLET BY MOUTH AT BEDTIME  90 tablet  2  . Calcium Carbonate-Vit D-Min (CALCIUM 1200 PO) Take by mouth  daily.      . fluticasone (FLONASE) 50 MCG/ACT nasal spray Place 2 sprays into the nose as needed.       . Glucosamine-Chondroitin (OSTEO BI-FLEX REGULAR STRENGTH PO) Take 1 tablet by mouth daily. daily      . Multiple Vitamin (MULTIVITAMIN) tablet Take 1 tablet by mouth daily.      . Omega-3 Fatty Acids (FISH OIL) 1200 MG CAPS Take 1 capsule by mouth daily.       No current facility-administered medications for this visit.    Past Medical History  Diagnosis Date  . GI bleed   . Shoulder joint pain   . Coronary artery disease s/p CABG 1990s     a. approx 1994 s/p CABG x 4 (LIMA->LAD, VG->RCA, VG->RI, VG->Diag);  b. 11/2008 Cath: LM 100, LAD small, RCA 50-60p, 100d, VG->RCA 100 ost, VG->RI 40m VG->Diag 40 - upper branch of diag small,  99%, LIMA-LAD patent.;  c. 08/2011 Echo: EF 45-50%, mod dil LA,/RA/RV with mild-mod reduced RV fxn.  . Back pain   . Hip pain, right   . PSA (psoriatic arthritis)     increased  . Lumbar disc disease   . Malignant neoplasm of prostate   . Fatigue   . Pulmonary fibrosis, postinflammatory     Stopped Cellcept 02/13/13 d/t cost   . Renal calculus   . Osteoarthritis   . HTN (hypertension)   . Depression   . Anxiety   . ED (erectile dysfunction)   . Hyperlipidemia   . GERD (gastroesophageal reflux disease)   . Allergic rhinitis   . Peptic stricture of esophagus   . Complete heart block     a. s/p S[facility] dc ppm, ser # [id number] . Chronic bronchitis 02/07/2012  . PVD (peripheral vascular disease)     a. 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  bilat ext iliac dzs.  . Pneumonia 1993    post-op  . Allergic rhinitis, cause unspecified 02/28/2013    Past Surgical History  Procedure Laterality Date  . Coronary angioplasty  06/03/2007, 01/25/2009  . Nasal septum surgery  april 2012    dr britt, [facility] [place]  . Coronary artery bypass graft  20 yrs ago    LIMA to LAD, SVG to PDA, SVG to Ramus, SVG to D  . Pacemaker placement  08/2011    Family History  Problem Relation Age of Onset  . Heart attack Father   . Heart disease Father   . Colon cancer Neg Hx   . Cancer Neg Hx     Colon    History   Social History  . Marital Status: Married    Spouse Name: N/A    Number of Children: 2  . Years of Education: N/A   Occupational History  . Retired    Social History Main Topics  . Smoking status: Former Smoker -- 1.50 packs/day for 42 years    Types: Cigarettes    Quit date: 09/11/2001  . Smokeless tobacco: Never Used     Comment: Pt does not get regular exercise  . Alcohol Use: No  . Drug Use: No  . Sexual Activity: Yes   Other Topics Concern  . 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    Review of Systems:  All systems reviewed.  They are negative to the above problem except as previously stated.  Vital Signs: BP 124/74  Pulse 95  Ht 6' (1.829 m)  Wt 231 lb (104.781 kg)  BMI 31.32 kg/m2  Physical Exam Patient is in NAD Sats walking go down to 89/90% on RA. HEENT:  Normocephalic, atraumatic. EOMI, PERRLA.  Neck: JVP is normal.  No bruits.  Lungs: clear to auscultation. No rales no wheezes.  Heart: Regular rate and rhythm. Normal S1, S2. No S3.   No significant murmurs. PMI not displaced.  Abdomen:  Supple, nontender. Normal bowel sounds. No masses. No hepatomegaly.  Extremities:   Good distal pulses throughout. No lower extremity edema.  Musculoskeletal :moving all extremities.  Neuro:   alert and oriented x3.  CN II-XII grossly intact.   Assessment and Plan:  1.  CAD  Stable CAD  Would continue medical Rx.  2.  LV dysfunction  Chronic systolic CHF    Volume status looks good  Continue meds   3.  PUlm clinically improved  Follow in pulmonary.    4.  HL  Continue statin.

## 2013-04-03 ENCOUNTER — Encounter: Payer: Self-pay | Admitting: Internal Medicine

## 2013-04-03 ENCOUNTER — Ambulatory Visit (INDEPENDENT_AMBULATORY_CARE_PROVIDER_SITE_OTHER): Payer: Medicare Other | Admitting: *Deleted

## 2013-04-03 ENCOUNTER — Telehealth: Payer: Self-pay | Admitting: Internal Medicine

## 2013-04-03 DIAGNOSIS — I442 Atrioventricular block, complete: Secondary | ICD-10-CM

## 2013-04-03 LAB — MDC_IDC_ENUM_SESS_TYPE_INCLINIC
Battery Remaining Longevity: 8.2
Battery Voltage: 2.95 V
Brady Statistic RV Percent Paced: 98 %
Implantable Pulse Generator Model: 2110
Lead Channel Impedance Value: 480 Ohm
Lead Channel Impedance Value: 600 Ohm
Lead Channel Pacing Threshold Amplitude: 0.75 V
Lead Channel Pacing Threshold Pulse Width: 0.4 ms
Lead Channel Sensing Intrinsic Amplitude: 12 mV
Lead Channel Sensing Intrinsic Amplitude: 5 mV
Lead Channel Setting Pacing Amplitude: 0.875
Lead Channel Setting Pacing Amplitude: 2.5 V
Lead Channel Setting Pacing Pulse Width: 0.4 ms

## 2013-04-03 NOTE — Telephone Encounter (Signed)
Wayne Torres has spoken with device clinic and patient has appointment this afternoon to have device checked.

## 2013-04-03 NOTE — Telephone Encounter (Signed)
New problem    Pt is having sx and they need pacer recommendations and instruction. Please call.

## 2013-04-07 ENCOUNTER — Other Ambulatory Visit: Payer: Self-pay | Admitting: Internal Medicine

## 2013-04-07 NOTE — Progress Notes (Signed)
Pacemaker check in clinic. Normal device function. Thresholds, sensing, impedances consistent with previous measurements. Device programmed to maximize longevity. 46 mode switches (<1%)---max dur. 1 min 6 sec, Peak A 171, Peak V 96---AT/AFL. No high ventricular rates noted. Device programmed at appropriate safety margins. Histogram distribution appropriate for patient activity level. Device programmed to optimize intrinsic conduction. Estimated longevity >8 years. Patient will follow up with SK in 6 months.

## 2013-04-09 ENCOUNTER — Other Ambulatory Visit: Payer: Self-pay | Admitting: Internal Medicine

## 2013-04-19 ENCOUNTER — Encounter: Payer: Medicare Other | Admitting: Internal Medicine

## 2013-05-02 ENCOUNTER — Telehealth: Payer: Self-pay | Admitting: Internal Medicine

## 2013-05-02 ENCOUNTER — Ambulatory Visit: Payer: Medicare Other | Admitting: Nurse Practitioner

## 2013-05-02 ENCOUNTER — Other Ambulatory Visit: Payer: Self-pay

## 2013-05-02 ENCOUNTER — Telehealth: Payer: Self-pay

## 2013-05-02 MED ORDER — ALPRAZOLAM 0.25 MG PO TABS: ORAL_TABLET | ORAL | Status: DC

## 2013-05-02 NOTE — Telephone Encounter (Signed)
Per wife - 72 yr old grafts.  Pt complained of SOB and dizziness

## 2013-05-02 NOTE — Telephone Encounter (Signed)
Spoke with pt wife, she is concerned about the SOB the pt is having. It has been present for 3 days and occurs with little exertion. He has no edema. He is unable to sleep but she is not sure it is due to SOB. She would like the pt seen today. Pt to see lori gerhardt np today at 2:30P.

## 2013-05-02 NOTE — Telephone Encounter (Signed)
Faxed hardcopy to [facility]

## 2013-05-02 NOTE — Telephone Encounter (Signed)
Patient reports increase in SOB, dizziness worsening over past week. O2 Sats 90-91%. Pacemaker. BP 110/73. Requesting appointment as soon as opening available. Appointment scheduled for tomorrow, Wed 12/10 at 11:00am with Estella Husk, PA. Patient advised that should he experience worsening symptoms prior to tomorrow's appointment or if O2 Sats drop any lower/gets headache or numbness, he needs to call 911 or go to the Emergency [facility]. Patient instructed to come to appointment a few minutes early to register and bring medications with him. Verbalized understanding and agreement.

## 2013-05-02 NOTE — Telephone Encounter (Signed)
Done hardcopy to robin

## 2013-05-02 NOTE — Telephone Encounter (Signed)
This patient arrived at the office to be seen and it was evident that he needed to be seen by his PCP and not by Cardiology. I spoke with him and then called [facility] PCP Dr. Gwynn Burly office for an appt. For him. This patient was not seen in our office today, instead he will be seeing L. Kathlen Mody, NP, at [facility] office on Dec. 10 th,at 10 am. The patient was encouraged to go to the ER/Urgent Care if he felt worse or had a change in his symptoms before his appt. Tomorrow. Wayne Torres voiced his understanding of this and was pleased to be going to his PCP. This patient left without incident.

## 2013-05-02 NOTE — Telephone Encounter (Signed)
Pt has been complaining of dizziness and SOB most all day yesterday.  BP was 110/50's.  She did not report the HR bu pt has a pacemaker.  02 sat was 90%. Wife reports that he says his SOB is different and pt wants to be seen.  Aware I will forward message to Dr Harrington Challenger and her nurse for review, orders or an appointment.  She is in agreement.

## 2013-05-02 NOTE — Telephone Encounter (Signed)
New problem     Pt's wife called to say pt want to come in SOB & fatigue.

## 2013-05-03 ENCOUNTER — Encounter: Payer: Self-pay | Admitting: Nurse Practitioner

## 2013-05-03 ENCOUNTER — Ambulatory Visit: Payer: Medicare Other | Admitting: Physician Assistant

## 2013-05-03 ENCOUNTER — Ambulatory Visit (INDEPENDENT_AMBULATORY_CARE_PROVIDER_SITE_OTHER): Payer: Medicare Other | Admitting: Nurse Practitioner

## 2013-05-03 VITALS — BP 120/80 | HR 73 | Temp 97.6°F | Ht 72.0 in | Wt 234.2 lb

## 2013-05-03 DIAGNOSIS — J309 Allergic rhinitis, unspecified: Secondary | ICD-10-CM

## 2013-05-03 MED ORDER — FLUTICASONE PROPIONATE 50 MCG/ACT NA SUSP: 1.0000 | Freq: Two times a day (BID) | NASAL | Status: DC

## 2013-05-03 NOTE — Patient Instructions (Signed)
You likely have a virus causing your symptoms. The average duration of cold symptoms is 14 days. Start daily sinus rinses (neilmed Sinus Rinse).  Sip fluids every hour. Rest. If you are not feeling better in 1 week or develop fever or chest pain, call us for re-evaluation. Feel better!  Upper Respiratory Infection, Adult An upper respiratory infection (URI) is also sometimes known as the common cold. The upper respiratory tract includes the nose, sinuses, throat, trachea, and bronchi. Bronchi are the airways leading to the lungs. Most people improve within 1 week, but symptoms can last up to 2 weeks. A residual cough may last even longer.  CAUSES Many different viruses can infect the tissues lining the upper respiratory tract. The tissues become irritated and inflamed and often become very moist. Mucus production is also common. A cold is contagious. You can easily spread the virus to others by oral contact. This includes kissing, sharing a glass, coughing, or sneezing. Touching your mouth or nose and then touching a surface, which is then touched by another person, can also spread the virus. SYMPTOMS  Symptoms typically develop 1 to 3 days after you come in contact with a cold virus. Symptoms vary from person to person. They may include:  Runny nose.  Sneezing.  Nasal congestion.  Sinus irritation.  Sore throat.  Loss of voice (laryngitis).  Cough.  Fatigue.  Muscle aches.  Loss of appetite.  Headache.  Low-grade fever. DIAGNOSIS  You might diagnose your own cold based on familiar symptoms, since most people get a cold 2 to 3 times a year. Your caregiver can confirm this based on your exam. Most importantly, your caregiver can check that your symptoms are not due to another disease such as strep throat, sinusitis, pneumonia, asthma, or epiglottitis. Blood tests, throat tests, and X-rays are not necessary to diagnose a common cold, but they may sometimes be helpful in excluding other  more serious diseases. Your caregiver will decide if any further tests are required. RISKS AND COMPLICATIONS  You may be at risk for a more severe case of the common cold if you smoke cigarettes, have chronic heart disease (such as heart failure) or lung disease (such as asthma), or if you have a weakened immune system. The very young and very old are also at risk for more serious infections. Bacterial sinusitis, middle ear infections, and bacterial pneumonia can complicate the common cold. The common cold can worsen asthma and chronic obstructive pulmonary disease (COPD). Sometimes, these complications can require emergency medical care and may be life-threatening. PREVENTION  The best way to protect against getting a cold is to practice good hygiene. Avoid oral or hand contact with people with cold symptoms. Wash your hands often if contact occurs. There is no clear evidence that vitamin C, vitamin E, echinacea, or exercise reduces the chance of developing a cold. However, it is always recommended to get plenty of rest and practice good nutrition. TREATMENT  Treatment is directed at relieving symptoms. There is no cure. Antibiotics are not effective, because the infection is caused by a virus, not by bacteria. Treatment may include:  Increased fluid intake. Sports drinks offer valuable electrolytes, sugars, and fluids.  Breathing heated mist or steam (vaporizer or shower).  Eating chicken soup or other clear broths, and maintaining good nutrition.  Getting plenty of rest.  Using gargles or lozenges for comfort.  Controlling fevers with ibuprofen or acetaminophen as directed by your caregiver.  Increasing usage of your inhaler if you have  asthma. Zinc gel and zinc lozenges, taken in the first 24 hours of the common cold, can shorten the duration and lessen the severity of symptoms. Pain medicines may help with fever, muscle aches, and throat pain. A variety of non-prescription medicines are  available to treat congestion and runny nose. Your caregiver can make recommendations and may suggest nasal or lung inhalers for other symptoms.  HOME CARE INSTRUCTIONS   Only take over-the-counter or prescription medicines for pain, discomfort, or fever as directed by your caregiver.  Use a warm mist humidifier or inhale steam from a shower to increase air moisture. This may keep secretions moist and make it easier to breathe.  Drink enough water and fluids to keep your urine clear or pale yellow.  Rest as needed.  Return to work when your temperature has returned to normal or as your caregiver advises. You may need to stay home longer to avoid infecting others. You can also use a face mask and careful hand washing to prevent spread of the virus. SEEK MEDICAL CARE IF:   After the first few days, you feel you are getting worse rather than better.  You need your caregiver's advice about medicines to control symptoms.  You develop chills, worsening shortness of breath, or brown or red sputum. These may be signs of pneumonia.  You develop yellow or brown nasal discharge or pain in the face, especially when you bend forward. These may be signs of sinusitis.  You develop a fever, swollen neck glands, pain with swallowing, or white areas in the back of your throat. These may be signs of strep throat. SEEK IMMEDIATE MEDICAL CARE IF:   You have a fever.  You develop severe or persistent headache, ear pain, sinus pain, or chest pain.  You develop wheezing, a prolonged cough, cough up blood, or have a change in your usual mucus (if you have chronic lung disease).  You develop sore muscles or a stiff neck. Document Released: 11/04/2000 Document Revised: 08/03/2011 Document Reviewed: 09/12/2010 [facility] Patient Information 2014 [place], [place].

## 2013-05-03 NOTE — Progress Notes (Signed)
Pre-visit discussion using our clinic review tool. No additional management support is needed unless otherwise documented below in the visit note.  

## 2013-05-03 NOTE — Progress Notes (Signed)
   Subjective:    Patient ID: Wayne Torres, male    DOB: May 15, 1941, 72 y.o.   MRN: [id number]  URI  This is a new problem. The current episode started in the past 7 days (4d). The problem has been waxing and waning. There has been no fever. Associated symptoms include congestion, diarrhea (1 day, resolved) and headaches (slight). Pertinent negatives include no abdominal pain, chest pain, coughing, ear pain, nausea, neck pain, sinus pain, sneezing, sore throat, swollen glands, vomiting or wheezing. Associated symptoms comments: Body aches, fatigue. He has tried decongestant for the symptoms. The treatment provided mild relief.      Review of Systems  Constitutional: Positive for chills, appetite change and fatigue. Negative for fever and activity change.  HENT: Positive for congestion. Negative for ear pain, sneezing and sore throat.   Respiratory: Negative for cough, chest tightness, shortness of breath and wheezing.   Cardiovascular: Negative for chest pain.  Gastrointestinal: Positive for diarrhea (1 day, resolved). Negative for nausea, vomiting and abdominal pain.  Musculoskeletal: Negative for back pain and neck pain.  Neurological: Positive for headaches (slight).  Hematological: Negative for adenopathy.       Objective:   Physical Exam  Vitals reviewed. Constitutional: He is oriented to person, place, and time. He appears well-developed and well-nourished. No distress.  HENT:  Head: Normocephalic and atraumatic.  Right Ear: External ear normal.  Left Ear: External ear normal.  Mouth/Throat: Oropharynx is clear and moist. No oropharyngeal exudate.  Eyes: Conjunctivae are normal. Right eye exhibits no discharge. Left eye exhibits no discharge.  Neck: Normal range of motion. Neck supple. No thyromegaly present.  Cardiovascular: Normal rate, regular rhythm and normal heart sounds.   No murmur heard. Pulmonary/Chest: Effort normal and breath sounds normal. No respiratory  distress. He has no wheezes.  Lymphadenopathy:    He has no cervical adenopathy.  Neurological: He is alert and oriented to person, place, and time.  Skin: Skin is warm and dry.  Psychiatric: He has a normal mood and affect. His behavior is normal. Thought content normal.          Assessment & Plan:  1.Viral illness, possibly flu. See pt instructions. 2.allergic rhinitis - fluticasone (FLONASE) 50 MCG/ACT nasal spray; Place 1 spray into both nostrils 2 (two) times daily.  Dispense: 16 g; Refill: 3

## 2013-06-07 ENCOUNTER — Telehealth: Payer: Self-pay | Admitting: Internal Medicine

## 2013-06-07 MED ORDER — LISINOPRIL 10 MG PO TABS: 5.0000 mg | ORAL_TABLET | Freq: Every day | ORAL | Status: DC

## 2013-06-07 NOTE — Telephone Encounter (Signed)
Ok to forward to [employer]

## 2013-06-07 NOTE — Telephone Encounter (Signed)
Refill done.  

## 2013-06-07 NOTE — Telephone Encounter (Signed)
Patient called stating he needs a refill for isinopril (PRINIVIL,ZESTRIL) 10 MG tablet [facility] -  Please advise

## 2013-06-20 ENCOUNTER — Telehealth: Payer: Self-pay | Admitting: Internal Medicine

## 2013-06-20 MED ORDER — LISINOPRIL 10 MG PO TABS: 5.0000 mg | ORAL_TABLET | Freq: Every day | ORAL | Status: DC

## 2013-06-20 NOTE — Telephone Encounter (Signed)
Pt needs a new RX sent to [facility] in [place] due to insurance change for lisinopril.

## 2013-06-22 ENCOUNTER — Ambulatory Visit: Payer: Medicare Other | Admitting: Critical Care Medicine

## 2013-06-22 DIAGNOSIS — Z0279 Encounter for issue of other medical certificate: Secondary | ICD-10-CM

## 2013-08-03 ENCOUNTER — Other Ambulatory Visit: Payer: Self-pay

## 2013-08-03 MED ORDER — AMLODIPINE BESYLATE 5 MG PO TABS: 2.5000 mg | ORAL_TABLET | Freq: Every day | ORAL | Status: DC

## 2013-08-07 ENCOUNTER — Telehealth: Payer: Self-pay | Admitting: Internal Medicine

## 2013-08-07 NOTE — Telephone Encounter (Signed)
Pt called to check up on amlodipine refill. Pt is almost out of this med. Please check, pt stated that drug store does not have the refill.

## 2013-08-08 MED ORDER — AMLODIPINE BESYLATE 5 MG PO TABS: 2.5000 mg | ORAL_TABLET | Freq: Every day | ORAL | Status: DC

## 2013-08-10 MED ORDER — AMLODIPINE BESYLATE 5 MG PO TABS: 2.5000 mg | ORAL_TABLET | Freq: Every day | ORAL | Status: DC

## 2013-08-10 NOTE — Telephone Encounter (Signed)
Called the patient informed sent in #30 day supply with 11 RF's.  Called the pharmacy to inform as well as had sent in #90 previously and they did not get.  Pharmacy informed to refill.

## 2013-08-10 NOTE — Telephone Encounter (Signed)
The patient called and is hoping to get the am[facility]ipine refilled at [facility].  He states [facility] has called [facility] and they have not received the refill request.  He is hoping for a 30 day supply.

## 2013-08-10 NOTE — Addendum Note (Signed)
Addended by: Sharon Seller B on: 08/10/2013 11:07 AM   Modules accepted: Orders

## 2013-09-14 ENCOUNTER — Other Ambulatory Visit: Payer: Self-pay | Admitting: Internal Medicine

## 2013-10-04 ENCOUNTER — Encounter: Payer: Self-pay | Admitting: *Deleted

## 2013-11-01 ENCOUNTER — Ambulatory Visit (INDEPENDENT_AMBULATORY_CARE_PROVIDER_SITE_OTHER): Payer: Medicare Other | Admitting: Family Medicine

## 2013-11-01 ENCOUNTER — Encounter: Payer: Self-pay | Admitting: Family Medicine

## 2013-11-01 VITALS — BP 98/64 | HR 60 | Ht 72.0 in | Wt 234.0 lb

## 2013-11-01 DIAGNOSIS — S20219A Contusion of unspecified front wall of thorax, initial encounter: Secondary | ICD-10-CM

## 2013-11-01 MED ORDER — MELOXICAM 15 MG PO TABS: 15.0000 mg | ORAL_TABLET | Freq: Every day | ORAL | Status: DC

## 2013-11-01 NOTE — Assessment & Plan Note (Signed)
Patient does have what appears to be a rib contusion that is resolving on its own. We discussed icing and patient was given a handout is a very easy range of motion exercises that can be beneficial. Patient notes that the pain does not completely resolve in the next 2 weeks he'll come back again for further evaluation and treatment. Patient does red flags and went to seek medical attention. Patient will otherwise follow up on an as-needed basis. Patient was also given meloxicam for one week duration.  Spent greater than 25 minutes with patient face-to-face and had greater than 50% of counseling including as described above in assessment and plan.

## 2013-11-01 NOTE — Progress Notes (Signed)
  [facility] [place] [place], [place] [address] Phone: [phone] Subjective:     CC: Rib pain after fall  [id number] Wayne Torres is a 73 y.o. male coming in with complaint of  right-sided rib pain. Patient was attempting to drag his trashcan while he was on his riding mower. Patient states that be trashcan fell in it hit him right into the right side of his ribs. Patient states he had significant amount of pain immediately but no bruising and no swelling. This occurred approximately 3 days ago. Patient states since that time he continues to get better. Patient was told to have it checked out because his daughter who works in health care told him. States that the pain is only about 2/10. No trouble breathing and no chest pain.     Past medical history, social, surgical and family history all reviewed in electronic medical record.   Review of Systems: No headache, visual changes, nausea, vomiting, diarrhea, constipation, dizziness, abdominal pain, skin rash, fevers, chills, night sweats, weight loss, swollen lymph nodes, body aches, joint swelling, muscle aches, chest pain, shortness of breath, mood changes.   Objective Blood pressure 98/64, pulse 60, height 6' (1.829 m), weight 234 lb (106.142 kg), SpO2 92.00%.  General: No apparent distress alert and oriented x3 mood and affect normal, dressed appropriately.  HEENT: Pupils equal, extraocular movements intact  Respiratory: Patient's speak in full sentences and does not appear short of breath  Cardiovascular: No lower extremity edema, non tender, no erythema  Skin: Warm dry intact with no signs of infection or rash on extremities or on axial skeleton.  Abdomen: Soft nontender  Neuro: Cranial nerves II through XII are intact, neurovascularly intact in all extremities with 2+ DTRs and 2+ pulses.  Lymph: No lymphadenopathy of posterior or anterior cervical chain or axillae bilaterally.  Gait normal  with good balance and coordination.  MSK:  Non tender with full range of motion and good stability and symmetric strength and tone of shoulders, elbows, wrist, hip, knee and ankles bilaterally.  Rib exam shows no discoloration of the skin. He is minimally tender to palpation over the seventh rib anteriorly. There is no crepitus felt with palpation. Neurovascularly intact. Patient is able to take deep breath without any significant discomfort.    Impression and Recommendations:     This case required medical decision making of moderate complexity.

## 2013-11-01 NOTE — Patient Instructions (Addendum)
Great to meet you Ice 20 minutes 2 times a day can help Meloxicam daily for 1 week then as needed Come back again i n2 weeks if not perfect.  Rib Contusion A rib contusion (bruise) can occur by a blow to the chest or by a fall against a hard object. Usually these will be much better in a couple weeks. If X-rays were taken today and there are no broken bones (fractures), the diagnosis of bruising is made. However, broken ribs may not show up for several days, or may be discovered later on a routine X-ray when signs of healing show up. If this happens to you, it does not mean that something was missed on the X-ray, but simply that it did not show up on the first X-rays. Earlier diagnosis will not usually change the treatment. HOME CARE INSTRUCTIONS   Avoid strenuous activity. Be careful during activities and avoid bumping the injured ribs. Activities that pull on the injured ribs and cause pain should be avoided, if possible.  For the first day or two, an ice pack used every 20 minutes while awake may be helpful. Put ice in a plastic bag and put a towel between the bag and the skin.  Eat a normal, well-balanced diet. Drink plenty of fluids to avoid constipation.  Take deep breaths several times a day to keep lungs free of infection. Try to cough several times a day. Splint the injured area with a pillow while coughing to ease pain. Coughing can help prevent pneumonia.  Wear a rib belt or binder only if told to do so by your caregiver. If you are wearing a rib belt or binder, you must do the breathing exercises as directed by your caregiver. If not used properly, rib belts or binders restrict breathing which can lead to pneumonia.  Only take over-the-counter or prescription medicines for pain, discomfort, or fever as directed by your caregiver. SEEK MEDICAL CARE IF:   You or your child has an oral temperature above 102 F (38.9 C).  Your baby is older than 3 months with a rectal temperature of  100.5 F (38.1 C) or higher for more than 1 day.  You develop a cough, with thick or bloody sputum. SEEK IMMEDIATE MEDICAL CARE IF:   You have difficulty breathing.  You feel sick to your stomach (nausea), have vomiting or belly (abdominal) pain.  You have worsening pain, not controlled with medications, or there is a change in the location of the pain.  You develop sweating or radiation of the pain into the arms, jaw or shoulders, or become light headed or faint.  You or your child has an oral temperature above 102 F (38.9 C), not controlled by medicine.  Your or your baby is older than 3 months with a rectal temperature of 102 F (38.9 C) or higher.  Your baby is 88 months old or younger with a rectal temperature of 100.4 F (38 C) or higher. MAKE SURE YOU:   Understand these instructions.  Will watch your condition.  Will get help right away if you are not doing well or get worse. Document Released: 02/03/2001 Document Revised: 09/05/2012 Document Reviewed: 12/28/2007 [facility] Patient Information 2014 [place].

## 2013-11-06 ENCOUNTER — Encounter: Payer: Self-pay | Admitting: *Deleted

## 2013-11-25 ENCOUNTER — Other Ambulatory Visit: Payer: Self-pay | Admitting: Internal Medicine

## 2013-11-27 NOTE — Telephone Encounter (Signed)
Phoned in refill per Dr. Linna Darner.

## 2013-11-27 NOTE — Telephone Encounter (Signed)
#  30 Qd prn

## 2013-12-06 ENCOUNTER — Encounter: Payer: Self-pay | Admitting: Internal Medicine

## 2013-12-06 ENCOUNTER — Ambulatory Visit (INDEPENDENT_AMBULATORY_CARE_PROVIDER_SITE_OTHER): Payer: Medicare Other | Admitting: Internal Medicine

## 2013-12-06 VITALS — BP 123/75 | HR 59 | Ht 72.0 in | Wt 238.0 lb

## 2013-12-06 DIAGNOSIS — I442 Atrioventricular block, complete: Secondary | ICD-10-CM

## 2013-12-06 DIAGNOSIS — Z950 Presence of cardiac pacemaker: Secondary | ICD-10-CM

## 2013-12-06 LAB — MDC_IDC_ENUM_SESS_TYPE_INCLINIC
Battery Remaining Longevity: 122.4 mo
Brady Statistic RV Percent Paced: 98 %
Date Time Interrogation Session: 20150715154033
Implantable Pulse Generator Serial Number: 7327490
Lead Channel Impedance Value: 450 Ohm
Lead Channel Impedance Value: 600 Ohm
Lead Channel Pacing Threshold Amplitude: 0.625 V
Lead Channel Pacing Threshold Pulse Width: 0.4 ms
Lead Channel Pacing Threshold Pulse Width: 0.4 ms
Lead Channel Sensing Intrinsic Amplitude: 12 mV
Lead Channel Setting Pacing Amplitude: 0.875
Lead Channel Setting Pacing Amplitude: 2 V
Lead Channel Setting Sensing Sensitivity: 2 mV
MDC IDC MSMT BATTERY VOLTAGE: 2.95 V
MDC IDC MSMT LEADCHNL RA PACING THRESHOLD AMPLITUDE: 0.75 V
MDC IDC MSMT LEADCHNL RA PACING THRESHOLD AMPLITUDE: 0.75 V
MDC IDC MSMT LEADCHNL RA SENSING INTR AMPL: 4.1 mV
MDC IDC MSMT LEADCHNL RV PACING THRESHOLD PULSEWIDTH: 0.4 ms
MDC IDC SET LEADCHNL RV PACING PULSEWIDTH: 0.4 ms
MDC IDC STAT BRADY RA PERCENT PACED: 54 %

## 2013-12-06 NOTE — Patient Instructions (Addendum)
Your physician recommends that you continue on your current medications as directed. Please refer to the Current Medication list given to you today.  Your physician has requested that you have an echocardiogram before 12/30/13. Echocardiography is a painless test that uses sound waves to create images of your heart. It provides your doctor with information about the size and shape of your heart and how well your heart's chambers and valves are working. This procedure takes approximately one hour. There are no restrictions for this procedure.  Your physician wants you to follow-up in: 6 months with device clinic.  You will receive a reminder letter in the mail two months in advance. If you don't receive a letter, please call our office to schedule the follow-up appointment.  Your physician wants you to follow-up in: 1 year with Dr. Caryl Comes.  You will receive a reminder letter in the mail two months in advance. If you don't receive a letter, please call our office to schedule the follow-up appointment.

## 2013-12-06 NOTE — Progress Notes (Signed)
Patient Care Team: Biagio Borg, MD as PCP - General (Internal Medicine) [name], MD as Referring Physician (Cardiology) Elsie Stain, MD as Attending Physician (Pulmonary Disease)   HPI  Wayne Torres is a 73 y.o. male Seen in followup for pacemaker implanted April 2013 for high-grade heart block.  Is a history of remote bypass surgery He underwent catheterization last year and there has been interval decrease in left ventricular function to 30%. Global hypokinesis. His LIMA was patent. His vein graft to his obtuse marginal and diagonal was patent. Vein graft to the RCA was occluded.  He denies significant shortness of breath that he does not describe to his pulmonary fibrosis. He does not have peripheral edema. He also has hip bursitis which limits his activity. He denies peripheral edema. He's had no significant chest discomfort.    Past Medical History  Diagnosis Date  . GI bleed   . Shoulder joint pain   . Coronary artery disease s/p CABG 1990s     a. approx 1994 s/p CABG x 4 (LIMA->LAD, VG->RCA, VG->RI, VG->Diag);  b. 11/2008 Cath: LM 100, LAD small, RCA 50-60p, 100d, VG->RCA 100 ost, VG->RI 85m VG->Diag 40 - upper branch of diag small, 99%, LIMA-LAD patent.;  c. 08/2011 Echo: EF 45-50%, mod dil LA,/RA/RV with mild-mod reduced RV fxn.  . Back pain   . Hip pain, right   . PSA (psoriatic arthritis)     increased  . Lumbar disc disease   . Malignant neoplasm of prostate   . Fatigue   . Pulmonary fibrosis, postinflammatory     Stopped Cellcept 02/13/13 d/t cost   . Renal calculus   . Osteoarthritis   . HTN (hypertension)   . Depression   . Anxiety   . ED (erectile dysfunction)   . Hyperlipidemia   . GERD (gastroesophageal reflux disease)   . Allergic rhinitis   . Peptic stricture of esophagus   . Complete heart block     a. s/p SJM Accent dc ppm, ser # [id number] . Chronic bronchitis 02/07/2012  . PVD (peripheral vascular disease)     a. 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  bilat ext iliac dzs.  . Pneumonia 1993    post-op  . Allergic rhinitis, cause unspecified 02/28/2013    Past Surgical History  Procedure Laterality Date  . Coronary angioplasty  06/03/2007, 01/25/2009  . Nasal septum surgery  april 2012    dr britt, [facility] [place]  . Coronary artery bypass graft  20 yrs ago    LIMA to LAD, SVG to PDA, SVG to Ramus, SVG to D  . Pacemaker placement  08/2011    Current Outpatient Prescriptions  Medication Sig Dispense Refill  . ALPRAZolam (XANAX) 0.25 MG tablet TAKE 1 TABLET BY MOUTH EVERY DAY AS NEEDED  30 tablet  0  . amLODipine (NORVASC) 5 MG tablet Take 0.5 tablets (2.5 mg total) by mouth daily.  30 tablet  11  . citalopram (CELEXA) 20 MG tablet TAKE 1 TABLET BY MOUTH EVERY DAY  90 tablet  3  . finasteride (PROSCAR) 5 MG tablet Take 5 mg by mouth daily.       . Glucosamine-Chondroitin (OSTEO BI-FLEX REGULAR STRENGTH PO) Take 1 tablet by mouth daily. daily      . lisinopril (PRINIVIL,ZESTRIL) 10 MG tablet Take 0.5 tablets (5 mg total) by mouth daily.  90 tablet  3  .  meloxicam (MOBIC) 15 MG tablet Take 1 tablet (15 mg total) by mouth daily.  30 tablet  0  . metoprolol succinate (TOPROL-XL) 25 MG 24 hr tablet TAKE 1/2 TABLETS BY MOUTH TWICE DAILY  90 tablet  2  . Omega-3 Fatty Acids (FISH OIL) 1200 MG CAPS Take 1 capsule by mouth daily.      Marland Kitchen omeprazole (PRILOSEC) 40 MG capsule TAKE 1 CAPSULE BY MOUTH DAILY  90 capsule  3  . RAPAFLO 8 MG CAPS capsule       . simvastatin (ZOCOR) 40 MG tablet TAKE 1 TABLET BY MOUTH AT BEDTIME  90 tablet  1   No current facility-administered medications for this visit.    No Known Allergies  Review of Systems negative except from HPI and PMH  Physical Exam BP 123/75  Pulse 59  Ht 6' (1.829 m)  Wt 238 lb (107.956 kg)  BMI 32.27 kg/m2 Well developed and well nourished in no acute distress HENT normal E scleral and icterus clear Neck  Supple JVP flat; carotids brisk and full Clear to ausculation Device pocket well healed; without hematoma or erythema.  There is no tethering Regular rate and rhythm, no murmurs gallops or rub Soft with active bowel sounds No clubbing cyanosis  Edema Alert and oriented, grossly normal motor and sensory function Skin Warm and Dry    ECG demonstrates P. synchronous pacing Intervals 20/20/48  Assessment and  Plan  Ischemic cardiomyopathy  Complete heart block  Pacemaker-St. Jude  The patient's device was interrogated.  The information was reviewed. No changes were made in the programming.    Coronary artery disease  Without symptoms of ischemia  Congestive heart failure-chronic-systolic   Euvolemic continue current meds   I will obtain another echocardiogram as it is been about 18 months.  I am impressed at the change in left ventricular function temporally associated with this pacing. His symptoms however are rather minimal at present. CRT upgrade would be appropriate to consider at some time if the symptoms worsened

## 2013-12-20 ENCOUNTER — Ambulatory Visit (HOSPITAL_COMMUNITY): Payer: Medicare Other | Attending: Cardiology | Admitting: Cardiology

## 2013-12-20 DIAGNOSIS — I442 Atrioventricular block, complete: Secondary | ICD-10-CM | POA: Insufficient documentation

## 2013-12-20 DIAGNOSIS — E785 Hyperlipidemia, unspecified: Secondary | ICD-10-CM | POA: Diagnosis not present

## 2013-12-20 DIAGNOSIS — I10 Essential (primary) hypertension: Secondary | ICD-10-CM | POA: Diagnosis not present

## 2013-12-20 DIAGNOSIS — I509 Heart failure, unspecified: Secondary | ICD-10-CM | POA: Diagnosis present

## 2013-12-20 DIAGNOSIS — I2510 Atherosclerotic heart disease of native coronary artery without angina pectoris: Secondary | ICD-10-CM | POA: Insufficient documentation

## 2013-12-20 DIAGNOSIS — Z950 Presence of cardiac pacemaker: Secondary | ICD-10-CM | POA: Diagnosis not present

## 2013-12-20 DIAGNOSIS — Z951 Presence of aortocoronary bypass graft: Secondary | ICD-10-CM | POA: Insufficient documentation

## 2013-12-20 DIAGNOSIS — I5022 Chronic systolic (congestive) heart failure: Secondary | ICD-10-CM

## 2013-12-20 NOTE — Progress Notes (Signed)
Echo performed. 

## 2013-12-27 ENCOUNTER — Other Ambulatory Visit: Payer: Self-pay | Admitting: Internal Medicine

## 2013-12-27 NOTE — Telephone Encounter (Signed)
Done hardcopy to [facility]  

## 2013-12-27 NOTE — Telephone Encounter (Signed)
Faxed hardcopy to [facility]

## 2014-01-01 ENCOUNTER — Ambulatory Visit: Payer: Medicare Other | Admitting: Internal Medicine

## 2014-02-16 ENCOUNTER — Encounter: Payer: Medicare Other | Admitting: Internal Medicine

## 2014-02-16 NOTE — Progress Notes (Signed)
Marland Kitchen  This encounter was created in error - please disregard.

## 2014-03-09 ENCOUNTER — Other Ambulatory Visit: Payer: Self-pay

## 2014-03-16 ENCOUNTER — Other Ambulatory Visit: Payer: Self-pay | Admitting: Internal Medicine

## 2014-03-25 ENCOUNTER — Other Ambulatory Visit: Payer: Self-pay | Admitting: Internal Medicine

## 2014-03-26 NOTE — Telephone Encounter (Signed)
Done hardcopy to [facility]  Due for ROV

## 2014-03-27 NOTE — Telephone Encounter (Signed)
Faxed hardcopy for Alprazolam to [facility] [place]

## 2014-04-01 NOTE — Progress Notes (Signed)
Wayne Torres is a 73 year old gentleman. He has a history of coronary artery disease. Most recent rig ght/leaft heart cath  Hemodynamics  Showed RA 7; PA 36/9; PCWP 8  Cardiac index 2.52mmin/m2 Coronary dominance: right Left main: 99% mid  stenosis.  Left anterior descending (LAD): 100% occlusion at the ostium.  Left circumflex (LCx): 100% occlusion proximally.  Right coronary artery (RCA): The right coronary is a dominant vessel. It has 50-60% stenosis in the proximal vessel followed by 50-60% segmental disease in the mid vessel. The PL OM is occluded proximally. The PDA is without significant disease. There are left to right and right to right collaterals to the PL OM.  The saphenous vein graft to the first diagonal is patent. There is 20-30% disease in the proximal vein graft. After the graft touchdown there is a 90% stenosis in the mid to distal diagonal. The vessel is very small in caliber in this location.  The saphenous vein graft to the obtuse marginal vessel is patent. There is 40-50% stenosis in the proximal vein graft. There is aneurysmal dilatation in the distal vein graft. The obtuse marginal vessel is fairly small.  The saphenous vein graft to the posterior lateral branch of the right coronary is occluded proximally.  The LIMA graft to the LAD is patent  Left ventriculography: Left ventricular systolic function is abnormal. Left ventricle is dilated with inferior wall akinesis and severe global hypokinesis. Ejection fraction is estimated at 30% there is no significant mitral regurgitation   Compared to his prior study in 2010 there is no significant change in his coronary circulation. His LV function is decreased. Plan was for continued medical Rx  Since I saw him in clinic November 2014 he was seen by [facility].   He has done OK  Breathing is stable  He denies signif CP    No Known Allergies  Current Outpatient Prescriptions  Medication Sig Dispense Refill  . ALPRAZolam  (XANAX) 0.25 MG tablet TAKE 1 TABLET DAILY AS NEEDED 30 tablet 0  . amLODipine (NORVASC) 5 MG tablet Take 0.5 tablets (2.5 mg total) by mouth daily. 30 tablet 11  . calcium citrate (CALCITRATE - DOSED IN MG ELEMENTAL CALCIUM) 950 MG tablet Take 200 mg of elemental calcium by mouth daily. With Vitamin D #3 take 1 tab daily    . citalopram (CELEXA) 20 MG tablet TAKE 1 TABLET BY MOUTH EVERY DAY 90 tablet 3  . finasteride (PROSCAR) 5 MG tablet Take 5 mg by mouth daily.     . Garlic 12725MG CAPS Take by mouth. 1 tab daily    . lisinopril (PRINIVIL,ZESTRIL) 10 MG tablet Take 0.5 tablets (5 mg total) by mouth daily. 90 tablet 3  . Magnesium 250 MG TABS Take by mouth. Take 1 tablet daily    . metoprolol succinate (TOPROL-XL) 25 MG 24 hr tablet TAKE 1/2 TABLETS BY MOUTH TWICE DAILY 90 tablet 2  . Multiple Vitamins-Minerals (EYE VITAMINS) CAPS Take by mouth.    . Omega-3 Fatty Acids (FISH OIL) 1200 MG CAPS Take 1 capsule by mouth daily.    .Marland Kitchenomeprazole (PRILOSEC) 40 MG capsule TAKE 1 CAPSULE BY MOUTH DAILY 90 capsule 3  . simvastatin (ZOCOR) 40 MG tablet TAKE 1 TABLET BY MOUTH AT BEDTIME 90 tablet 0  . vitamin E 400 UNIT capsule Take 400 Units by mouth daily. Take 1 tab daily     No current facility-administered medications for this visit.    Past Medical History  Diagnosis Date  . GI bleed   . Shoulder joint pain   . Coronary artery disease s/p CABG 1990s     a. approx 1994 s/p CABG x 4 (LIMA->LAD, VG->RCA, VG->RI, VG->Diag);  b. 11/2008 Cath: LM 100, LAD small, RCA 50-60p, 100d, VG->RCA 100 ost, VG->RI 54m VG->Diag 40 - upper branch of diag small, 99%, LIMA-LAD patent.;  c. 08/2011 Echo: EF 45-50%, mod dil LA,/RA/RV with mild-mod reduced RV fxn.  . Back pain   . Hip pain, right   . PSA (psoriatic arthritis)     increased  . Lumbar disc disease   . Malignant neoplasm of prostate   . Fatigue   . Pulmonary fibrosis, postinflammatory     Stopped Cellcept 02/13/13 d/t cost   . Renal calculus   .  Osteoarthritis   . HTN (hypertension)   . Depression   . Anxiety   . ED (erectile dysfunction)   . Hyperlipidemia   . GERD (gastroesophageal reflux disease)   . Allergic rhinitis   . Peptic stricture of esophagus   . Complete heart block     a. s/p SJM Accent dc ppm, ser # [id number] . Chronic bronchitis 02/07/2012  . PVD (peripheral vascular disease)     a. 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  bilat ext iliac dzs.  . Pneumonia 1993    post-op  . Allergic rhinitis, cause unspecified 02/28/2013    Past Surgical History  Procedure Laterality Date  . Coronary angioplasty  06/03/2007, 01/25/2009  . Nasal septum surgery  april 2012    dr britt, [facility] - [place]  . Coronary artery bypass graft  20 yrs ago    LIMA to LAD, SVG to PDA, SVG to Ramus, SVG to D  . Pacemaker placement  08/2011    Family History  Problem Relation Age of Onset  . Heart attack Father   . Heart disease Father   . Colon cancer Neg Hx   . Cancer Neg Hx     Colon    History   Social History  . Marital Status: Married    Spouse Name: N/A    Number of Children: 2  . Years of Education: N/A   Occupational History  . Retired    Social History Main Topics  . Smoking status: Former Smoker -- 1.50 packs/day for 42 years    Types: Cigarettes    Quit date: 09/11/2001  . Smokeless tobacco: Never Used     Comment: Pt does not get regular exercise  . Alcohol Use: No  . Drug Use: No  . Sexual Activity: Yes   Other Topics Concern  . 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    Review of Systems:  All systems reviewed.  They are negative to the above problem except as previously stated.  Vital Signs: BP 122/64 mmHg  Pulse 62  Ht 6' (1.829 m)  Wt 238 lb (107.956 kg)  BMI 32.27 kg/m2  Physical Exam Patient is in NAD Sats walking go down to 89/90%  on RA. HEENT:  Normocephalic, atraumatic. EOMI, PERRLA.  Neck: JVP is normal.  No bruits.  Lungs: clear to auscultation. No rales no wheezes.  Heart: Regular rate and rhythm. Normal S1, S2. No S3.   No significant murmurs. PMI not displaced.  Abdomen:  Supple, nontender.  Normal bowel sounds. No masses. No hepatomegaly.  Extremities:   Good distal pulses throughout. No lower extremity edema.  Musculoskeletal :moving all extremities.  Neuro:   alert and oriented x3.  CN II-XII grossly intact.  EKG  Probable SR 52 BPM  Ventricular paced Assessment and Plan:  1.  CAD  Stable CAD  Would continue medical Rx.  2.  LV dysfunction  Chronic systolic CHF    Volume status looks good  Continue meds   3.   HL  Continue statin.

## 2014-04-02 ENCOUNTER — Encounter: Payer: Self-pay | Admitting: Internal Medicine

## 2014-04-02 ENCOUNTER — Ambulatory Visit (INDEPENDENT_AMBULATORY_CARE_PROVIDER_SITE_OTHER): Payer: Medicare Other | Admitting: Internal Medicine

## 2014-04-02 ENCOUNTER — Ambulatory Visit (INDEPENDENT_AMBULATORY_CARE_PROVIDER_SITE_OTHER): Payer: Medicare Other | Admitting: *Deleted

## 2014-04-02 VITALS — BP 122/64 | HR 62 | Ht 72.0 in | Wt 238.0 lb

## 2014-04-02 DIAGNOSIS — Z23 Encounter for immunization: Secondary | ICD-10-CM

## 2014-04-02 DIAGNOSIS — I5022 Chronic systolic (congestive) heart failure: Secondary | ICD-10-CM

## 2014-04-02 NOTE — Patient Instructions (Signed)
Your physician recommends that you continue on your current medications as directed. Please refer to the Current Medication list given to you today. Your physician wants you to follow-up in: 1 year with Dr. Harrington Challenger.  You will receive a reminder letter in the mail two months in advance. If you don't receive a letter, please call our office to schedule the follow-up appointment.

## 2014-04-09 ENCOUNTER — Other Ambulatory Visit: Payer: Self-pay | Admitting: Internal Medicine

## 2014-04-16 ENCOUNTER — Telehealth: Payer: Self-pay | Admitting: Internal Medicine

## 2014-04-16 NOTE — Telephone Encounter (Signed)
Walk in pt form " Disability Parking Pla-Card" Dropped Off gave to Michalene 11.23.15/km

## 2014-04-26 ENCOUNTER — Other Ambulatory Visit: Payer: Self-pay | Admitting: Internal Medicine

## 2014-04-26 ENCOUNTER — Telehealth: Payer: Self-pay | Admitting: *Deleted

## 2014-04-26 NOTE — Telephone Encounter (Signed)
Informed patient that applications for disability parking placards for him and his wife are completed by Dr. Harrington Challenger. Pt request they be mailed to their home. Will mail.

## 2014-05-01 ENCOUNTER — Other Ambulatory Visit: Payer: Self-pay | Admitting: *Deleted

## 2014-05-01 ENCOUNTER — Ambulatory Visit (INDEPENDENT_AMBULATORY_CARE_PROVIDER_SITE_OTHER): Payer: Medicare Other | Admitting: Internal Medicine

## 2014-05-01 ENCOUNTER — Encounter: Payer: Self-pay | Admitting: Internal Medicine

## 2014-05-01 ENCOUNTER — Other Ambulatory Visit (INDEPENDENT_AMBULATORY_CARE_PROVIDER_SITE_OTHER): Payer: Medicare Other

## 2014-05-01 VITALS — BP 130/60 | HR 60 | Temp 98.0°F | Ht 72.0 in | Wt 239.0 lb

## 2014-05-01 DIAGNOSIS — E785 Hyperlipidemia, unspecified: Secondary | ICD-10-CM

## 2014-05-01 DIAGNOSIS — R972 Elevated prostate specific antigen [PSA]: Secondary | ICD-10-CM

## 2014-05-01 DIAGNOSIS — I10 Essential (primary) hypertension: Secondary | ICD-10-CM

## 2014-05-01 DIAGNOSIS — Z23 Encounter for immunization: Secondary | ICD-10-CM

## 2014-05-01 LAB — CBC WITH DIFFERENTIAL/PLATELET
BASOS PCT: 0.5 % (ref 0.0–3.0)
Basophils Absolute: 0 10*3/uL (ref 0.0–0.1)
Eosinophils Absolute: 0.3 10*3/uL (ref 0.0–0.7)
Eosinophils Relative: 3.3 % (ref 0.0–5.0)
HCT: 44.2 % (ref 39.0–52.0)
HEMOGLOBIN: 14.8 g/dL (ref 13.0–17.0)
LYMPHS PCT: 18.9 % (ref 12.0–46.0)
Lymphs Abs: 1.9 10*3/uL (ref 0.7–4.0)
MCHC: 33.5 g/dL (ref 30.0–36.0)
MCV: 93.2 fl (ref 78.0–100.0)
MONOS PCT: 10.7 % (ref 3.0–12.0)
Monocytes Absolute: 1.1 10*3/uL — ABNORMAL HIGH (ref 0.1–1.0)
NEUTROS ABS: 6.7 10*3/uL (ref 1.4–7.7)
Neutrophils Relative %: 66.6 % (ref 43.0–77.0)
Platelets: 273 10*3/uL (ref 150.0–400.0)
RBC: 4.75 Mil/uL (ref 4.22–5.81)
RDW: 13.2 % (ref 11.5–15.5)
WBC: 10.1 10*3/uL (ref 4.0–10.5)

## 2014-05-01 LAB — URINALYSIS, ROUTINE W REFLEX MICROSCOPIC
Bilirubin Urine: NEGATIVE
Hgb urine dipstick: NEGATIVE
Ketones, ur: NEGATIVE
Leukocytes, UA: NEGATIVE
NITRITE: NEGATIVE
RBC / HPF: NONE SEEN (ref 0–?)
Specific Gravity, Urine: >=1.03 — AB (ref 1.000–1.030)
Total Protein, Urine: NEGATIVE
URINE GLUCOSE: NEGATIVE
UROBILINOGEN UA: 1 (ref 0.0–1.0)
pH: 6 (ref 5.0–8.0)

## 2014-05-01 MED ORDER — SIMVASTATIN 40 MG PO TABS: 40.0000 mg | ORAL_TABLET | Freq: Every day | ORAL | Status: DC

## 2014-05-01 MED ORDER — ALBUTEROL SULFATE HFA 108 (90 BASE) MCG/ACT IN AERS: 2.0000 | INHALATION_SPRAY | Freq: Four times a day (QID) | RESPIRATORY_TRACT | Status: DC | PRN

## 2014-05-01 MED ORDER — OMEPRAZOLE 40 MG PO CPDR: 40.0000 mg | DELAYED_RELEASE_CAPSULE | Freq: Every day | ORAL | Status: DC

## 2014-05-01 MED ORDER — ALPRAZOLAM 0.25 MG PO TABS: 0.2500 mg | ORAL_TABLET | Freq: Every day | ORAL | Status: DC | PRN

## 2014-05-01 MED ORDER — LISINOPRIL 10 MG PO TABS: 5.0000 mg | ORAL_TABLET | Freq: Every day | ORAL | Status: DC

## 2014-05-01 MED ORDER — CITALOPRAM HYDROBROMIDE 20 MG PO TABS: 20.0000 mg | ORAL_TABLET | Freq: Every day | ORAL | Status: DC

## 2014-05-01 NOTE — Addendum Note (Signed)
Addended by: Julieta Bellini on: 05/01/2014 05:18 PM   Modules accepted: Orders

## 2014-05-01 NOTE — Assessment & Plan Note (Signed)
Asympt, for f/u psa as he is due

## 2014-05-01 NOTE — Progress Notes (Signed)
Pre visit review using our clinic review tool, if applicable. No additional management support is needed unless otherwise documented below in the visit note.

## 2014-05-01 NOTE — Assessment & Plan Note (Signed)
stable overall by history and exam, recent data reviewed with pt, and pt to continue medical treatment as before,  to f/u any worsening symptoms or concerns Lab Results  Component Value Date   LDLCALC 45 02/28/2013

## 2014-05-01 NOTE — Assessment & Plan Note (Signed)
stable overall by history and exam, recent data reviewed with pt, and pt to continue medical treatment as before,  to f/u any worsening symptoms or concerns BP Readings from Last 3 Encounters:  05/01/14 130/60  04/02/14 122/64  12/06/13 123/75

## 2014-05-01 NOTE — Progress Notes (Signed)
Subjective:    Patient ID: Wayne Torres, male    DOB: 1940-06-24, 73 y.o.   MRN: [id number]  HPI  Here for yearly f/u;  Overall doing ok;  Pt denies CP, worsening SOB, DOE, wheezing, orthopnea, PND, worsening LE edema, palpitations, dizziness or syncope.  Pt denies neurological change such as new headache, facial or extremity weakness.  Pt denies polydipsia, polyuria, or low sugar symptoms. Pt states overall good compliance with treatment and medications, good tolerability, and has been trying to follow lower cholesterol diet.  Pt denies worsening depressive symptoms, suicidal ideation or panic. No fever, night sweats, wt loss, loss of appetite, or other constitutional symptoms.  Pt states good ability with ADL's, has low fall risk, home safety reviewed and adequate, no other significant changes in hearing or vision, and only occasionally active with exercise. Sees cardeilogy regularly. Denies worsening depressive symptoms, suicidal ideation, or panic; has ongoing anxiety, not increased recently.  Past Medical History  Diagnosis Date  . GI bleed   . Shoulder joint pain   . Coronary artery disease s/p CABG 1990s     a. approx 1994 s/p CABG x 4 (LIMA->LAD, VG->RCA, VG->RI, VG->Diag);  b. 11/2008 Cath: LM 100, LAD small, RCA 50-60p, 100d, VG->RCA 100 ost, VG->RI 56m VG->Diag 40 - upper branch of diag small, 99%, LIMA-LAD patent.;  c. 08/2011 Echo: EF 45-50%, mod dil LA,/RA/RV with mild-mod reduced RV fxn.  . Back pain   . Hip pain, right   . PSA (psoriatic arthritis)     increased  . Lumbar disc disease   . Malignant neoplasm of prostate   . Fatigue   . Pulmonary fibrosis, postinflammatory     Stopped Cellcept 02/13/13 d/t cost   . Renal calculus   . Osteoarthritis   . HTN (hypertension)   . Depression   . Anxiety   . ED (erectile dysfunction)   . Hyperlipidemia   . GERD (gastroesophageal reflux disease)   . Allergic rhinitis   . Peptic stricture of esophagus   . Complete heart block       a. s/p SJM Accent dc ppm, ser # [id number] . Chronic bronchitis 02/07/2012  . PVD (peripheral vascular disease)     a. 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  bilat ext iliac dzs.  . Pneumonia 1993    post-op  . Allergic rhinitis, cause unspecified 02/28/2013   Past Surgical History  Procedure Laterality Date  . Coronary angioplasty  06/03/2007, 01/25/2009  . Nasal septum surgery  april 2012    dr britt, [facility] [place]  . Coronary artery bypass graft  20 yrs ago    LIMA to LAD, SVG to PDA, SVG to Ramus, SVG to D  . Pacemaker placement  08/2011    reports that he quit smoking about 12 years ago. His smoking use included Cigarettes. He has a 63 pack-year smoking history. He has never used smokeless tobacco. He reports that he does not drink alcohol or use illicit drugs. family history includes Heart attack in his father; Heart disease in his father. There is no history of Colon cancer or Cancer. No Known Allergies Current Outpatient Prescriptions on File Prior to Visit  Medication Sig Dispense Refill  . ALPRAZolam (XANAX) 0.25 MG tablet TAKE 1 TABLET DAILY AS NEEDED 30 tablet 0  . amLODipine (NORVASC) 5 MG tablet Take 0.5 tablets (2.5 mg total) by mouth daily. 30 tablet 11  . calcium citrate (CALCITRATE -  DOSED IN MG ELEMENTAL CALCIUM) 950 MG tablet Take 200 mg of elemental calcium by mouth daily. With Vitamin D #3 take 1 tab daily    . citalopram (CELEXA) 20 MG tablet TAKE 1 TABLET BY MOUTH EVERY DAY 90 tablet 3  . finasteride (PROSCAR) 5 MG tablet Take 5 mg by mouth daily.     . Garlic 2130 MG CAPS Take by mouth. 1 tab daily    . lisinopril (PRINIVIL,ZESTRIL) 10 MG tablet Take 0.5 tablets (5 mg total) by mouth daily. 90 tablet 3  . Magnesium 250 MG TABS Take by mouth. Take 1 tablet daily    . metoprolol succinate (TOPROL-XL) 25 MG 24 hr tablet TAKE 1/2 TABLET TWICE A DAY 90 tablet 3  . Multiple Vitamins-Minerals (EYE VITAMINS) CAPS Take by mouth.    .  Omega-3 Fatty Acids (FISH OIL) 1200 MG CAPS Take 1 capsule by mouth daily.    Marland Kitchen omeprazole (PRILOSEC) 40 MG capsule TAKE 1 CAPSULE BY MOUTH DAILY 90 capsule 3  . simvastatin (ZOCOR) 40 MG tablet TAKE 1 TABLET BY MOUTH AT BEDTIME 90 tablet 0  . vitamin E 400 UNIT capsule Take 400 Units by mouth daily. Take 1 tab daily     No current facility-administered medications on file prior to visit.   Review of Systems Constitutional: Negative for increased diaphoresis, other activity, appetite or other siginficant weight change  HENT: Negative for worsening hearing loss, ear pain, facial swelling, mouth sores and neck stiffness.   Eyes: Negative for other worsening pain, redness or visual disturbance.  Respiratory: Negative for shortness of breath and wheezing.   Cardiovascular: Negative for chest pain and palpitations.  Gastrointestinal: Negative for diarrhea, blood in stool, abdominal distention or other pain Genitourinary: Negative for hematuria, flank pain or change in urine volume.  Musculoskeletal: Negative for myalgias or other joint complaints.  Skin: Negative for color change and wound.  Neurological: Negative for syncope and numbness. other than noted Hematological: Negative for adenopathy. or other swelling Psychiatric/Behavioral: Negative for hallucinations, self-injury, decreased concentration or other worsening agitation.      Objective:   Physical Exam BP 130/60 mmHg  Pulse 60  Temp(Src) 98 F (36.7 C) (Oral)  Ht 6' (1.829 m)  Wt 239 lb (108.41 kg)  BMI 32.41 kg/m2  SpO2 92% VS noted,  Constitutional: Pt is oriented to person, place, and time. Appears well-developed and well-nourished.  Head: Normocephalic and atraumatic.  Right Ear: External ear normal.  Left Ear: External ear normal.  Nose: Nose normal.  Mouth/Throat: Oropharynx is clear and moist.  Eyes: Conjunctivae and EOM are normal. Pupils are equal, round, and reactive to light.  Neck: Normal range of motion. Neck  supple. No JVD present. No tracheal deviation present.  Cardiovascular: Normal rate, regular rhythm, normal heart sounds and intact distal pulses.   Pulmonary/Chest: Effort normal and breath sounds without rales or wheezing  Abdominal: Soft. Bowel sounds are normal. NT. No HSM  Musculoskeletal: Normal range of motion. Exhibits no edema.  Lymphadenopathy:  Has no cervical adenopathy.  Neurological: Pt is alert and oriented to person, place, and time. Pt has normal reflexes. No cranial nerve deficit. Motor grossly intact Skin: Skin is warm and dry. No rash noted.  Psychiatric:  Has anxious mood and affect. Behavior is normal.      Assessment & Plan:

## 2014-05-01 NOTE — Patient Instructions (Addendum)
You had the new prevnar pneumonia shot  Please continue all other medications as before, and refills have been done if requested - the xanax  Please have the pharmacy call with any other refills you may need.  Please continue your efforts at being more active, low cholesterol diet, and weight control.  You are otherwise up to date with prevention measures today.  Please keep your appointments with your specialists as you may have planned  Please go to the LAB in the Basement (turn left off the elevator) for the tests to be done today  You will be contacted by phone if any changes need to be made immediately.  Otherwise, you will receive a letter about your results with an explanation, but please check with MyChart first.  Please remember to sign up for MyChart if you have not done so, as this will be important to you in the future with finding out test results, communicating by private email, and scheduling acute appointments online when needed.  Please return in 1 year for your yearly visit, or sooner if needed

## 2014-05-02 LAB — BASIC METABOLIC PANEL
BUN: 22 mg/dL (ref 6–23)
CALCIUM: 8.9 mg/dL (ref 8.4–10.5)
CO2: 25 meq/L (ref 19–32)
Chloride: 102 mEq/L (ref 96–112)
Creatinine, Ser: 1.2 mg/dL (ref 0.4–1.5)
GFR: 61.46 mL/min (ref >60.00)
GLUCOSE: 111 mg/dL — AB (ref 70–99)
Potassium: 3.9 mEq/L (ref 3.5–5.1)
Sodium: 136 mEq/L (ref 135–145)

## 2014-05-02 LAB — LIPID PANEL
Cholesterol: 104 mg/dL (ref 0–200)
HDL: 27.8 mg/dL — AB (ref >39.00)
NonHDL: 76.2
TRIGLYCERIDES: 230 mg/dL — AB (ref 0.0–149.0)
Total CHOL/HDL Ratio: 4
VLDL: 46 mg/dL — ABNORMAL HIGH (ref 0.0–40.0)

## 2014-05-02 LAB — LDL CHOLESTEROL, DIRECT: Direct LDL: 49.4 mg/dL

## 2014-05-02 LAB — HEPATIC FUNCTION PANEL
ALBUMIN: 3.9 g/dL (ref 3.5–5.2)
ALT: 14 U/L (ref 0–53)
AST: 19 U/L (ref 0–37)
Alkaline Phosphatase: 68 U/L (ref 39–117)
BILIRUBIN DIRECT: 0.1 mg/dL (ref 0.0–0.3)
TOTAL PROTEIN: 6.9 g/dL (ref 6.0–8.3)
Total Bilirubin: 0.5 mg/dL (ref 0.2–1.2)

## 2014-05-02 LAB — PSA: PSA: 1.29 ng/mL (ref 0.10–4.00)

## 2014-05-02 LAB — TSH: TSH: 0.81 u[IU]/mL (ref 0.35–4.50)

## 2014-05-03 ENCOUNTER — Encounter (HOSPITAL_COMMUNITY): Payer: Self-pay | Admitting: Internal Medicine

## 2014-06-11 ENCOUNTER — Other Ambulatory Visit: Payer: Self-pay | Admitting: Internal Medicine

## 2014-06-13 ENCOUNTER — Encounter: Payer: Medicare Other | Admitting: Internal Medicine

## 2014-06-19 ENCOUNTER — Telehealth: Payer: Self-pay

## 2014-06-19 MED ORDER — AMLODIPINE BESYLATE 5 MG PO TABS: 2.5000 mg | ORAL_TABLET | Freq: Every day | ORAL | Status: DC

## 2014-06-19 NOTE — Telephone Encounter (Signed)
Refill request done.

## 2014-06-25 ENCOUNTER — Other Ambulatory Visit: Payer: Self-pay | Admitting: Internal Medicine

## 2014-06-28 ENCOUNTER — Encounter: Payer: Self-pay | Admitting: *Deleted

## 2014-07-04 ENCOUNTER — Other Ambulatory Visit: Payer: Self-pay | Admitting: Internal Medicine

## 2014-07-26 ENCOUNTER — Other Ambulatory Visit: Payer: Self-pay | Admitting: Internal Medicine

## 2014-07-26 ENCOUNTER — Ambulatory Visit (INDEPENDENT_AMBULATORY_CARE_PROVIDER_SITE_OTHER): Payer: Medicare Other | Admitting: *Deleted

## 2014-07-26 DIAGNOSIS — I442 Atrioventricular block, complete: Secondary | ICD-10-CM | POA: Diagnosis not present

## 2014-07-26 DIAGNOSIS — Z950 Presence of cardiac pacemaker: Secondary | ICD-10-CM | POA: Diagnosis not present

## 2014-07-26 LAB — MDC_IDC_ENUM_SESS_TYPE_INCLINIC
Battery Voltage: 2.95 V
Brady Statistic RA Percent Paced: 53 %
Lead Channel Impedance Value: 450 Ohm
Lead Channel Impedance Value: 587.5 Ohm
Lead Channel Pacing Threshold Amplitude: 0.625 V
Lead Channel Pacing Threshold Amplitude: 0.625 V
Lead Channel Pacing Threshold Pulse Width: 0.4 ms
Lead Channel Pacing Threshold Pulse Width: 0.4 ms
Lead Channel Sensing Intrinsic Amplitude: 12 mV
Lead Channel Sensing Intrinsic Amplitude: 4.5 mV
Lead Channel Setting Pacing Amplitude: 0.875
Lead Channel Setting Pacing Amplitude: 1.625
Lead Channel Setting Pacing Pulse Width: 0.4 ms
Lead Channel Setting Sensing Sensitivity: 2 mV
MDC IDC MSMT BATTERY REMAINING LONGEVITY: 126 mo
MDC IDC MSMT LEADCHNL RA PACING THRESHOLD AMPLITUDE: 0.75 V
MDC IDC MSMT LEADCHNL RA PACING THRESHOLD PULSEWIDTH: 0.4 ms
MDC IDC PG SERIAL: 7327490
MDC IDC SESS DTM: 20160303123509
MDC IDC STAT BRADY RV PERCENT PACED: 97 %

## 2014-07-26 NOTE — Progress Notes (Signed)
Pacemaker check in clinic. Normal device function. Thresholds, sensing, impedances consistent with previous measurements. Device programmed to maximize longevity. 7 mode switches--- <1%, longest 14sec. No high ventricular rates noted. Device programmed at appropriate safety margins. Histogram distribution appropriate for patient activity level. Device programmed to optimize intrinsic conduction. Estimated longevity 10.45yr. ROV w/ Dr. KCaryl Comesin 62mo

## 2014-07-27 NOTE — Telephone Encounter (Signed)
Rx done. 

## 2014-07-27 NOTE — Telephone Encounter (Signed)
Done hardcopy to [employer]

## 2014-08-02 ENCOUNTER — Encounter: Payer: Self-pay | Admitting: Critical Care Medicine

## 2014-08-02 ENCOUNTER — Ambulatory Visit (INDEPENDENT_AMBULATORY_CARE_PROVIDER_SITE_OTHER): Payer: Medicare Other | Admitting: Critical Care Medicine

## 2014-08-02 VITALS — BP 90/80 | HR 67 | Temp 97.6°F | Ht 72.0 in | Wt 231.0 lb

## 2014-08-02 DIAGNOSIS — J8410 Pulmonary fibrosis, unspecified: Secondary | ICD-10-CM | POA: Diagnosis not present

## 2014-08-02 DIAGNOSIS — J84112 Idiopathic pulmonary fibrosis: Secondary | ICD-10-CM

## 2014-08-02 NOTE — Patient Instructions (Addendum)
Labs today for fibrosis assessment CT chest  Lung function study No changes in medications An ONO will be obtained Return 6 weeks

## 2014-08-02 NOTE — Progress Notes (Signed)
Subjective:    Patient ID: Wayne Torres, male    DOB: Oct 06, 1940, 74 y.o.   MRN: [id number]  HPI 08/02/2014 Chief Complaint  Patient presents with  . Follow-up    patient doing well.  no concerns today.  No major changes in breathing .  The patient was last seen in 2014.  There is no cough. There is no chest pain. There is no wheezing. The patient previously had been on CellCept but did not seen any improvement and could not afford the medication.There is no joint pain. There is no lower extremity edema. The patient currently is not on oxygen therapy. Pt denies any significant sore throat, nasal congestion or excess secretions, fever, chills, sweats, unintended weight loss, pleurtic or exertional chest pain, orthopnea PND, or leg swelling Pt denies any increase in rescue therapy over baseline, denies waking up needing it or having any early am or nocturnal exacerbations of coughing/wheezing/or dyspnea. Pt also denies any obvious fluctuation in symptoms with  weather or environmental change or other alleviating or aggravating factors     Review of Systems Constitutional:   No  weight loss, night sweats,  Fevers, chills, fatigue, lassitude. HEENT:   No headaches,  Difficulty swallowing,  Tooth/dental problems,  Sore throat,                No sneezing, itching, ear ache, nasal congestion, post nasal drip,   CV:  No chest pain,  Orthopnea, PND, swelling in lower extremities, anasarca, dizziness, palpitations  GI  No heartburn, indigestion, abdominal pain, nausea, vomiting, diarrhea, change in bowel habits, loss of appetite  Resp: Notes shortness of breath with exertion or at rest.  No excess mucus, no productive cough,  Notes non-productive cough,  No coughing up of blood.  No change in color of mucus.  No wheezing.  No chest wall deformity  Skin: no rash or lesions.  GU: no dysuria, change in color of urine, no urgency or frequency.  No flank pain.  MS:  No joint pain or swelling.  No  decreased range of motion.  No back pain.  Psych:  No change in mood or affect. No depression or anxiety.  No memory loss.     Objective:   Physical Exam Filed Vitals:   08/02/14 1016  BP: 90/80  Pulse: 67  Temp: 97.6 F (36.4 C)  TempSrc: Oral  Height: 6' (1.829 m)  Weight: 231 lb (104.781 kg)  SpO2: 90%    Gen: Pleasant,  in no distress,  normal affect  ENT: No lesions,  mouth clear,  oropharynx clear, no postnasal drip  Neck: No JVD, no TMG, no carotid bruits  Lungs: No use of accessory muscles, no dullness to percussion, dry rales bibasilar   Cardiovascular: RRR, heart sounds normal, no murmur or gallops, no peripheral edema  Abdomen: soft and NT, no HSM,  BS normal  Musculoskeletal: No deformities, no cyanosis or clubbing  Neuro: alert, non focal  Skin: Warm, no lesions or rashes  No results found. Ambulatory saturation the office drops down to 84% on room air after 1 lap    Assessment & Plan:   IPF (idiopathic pulmonary fibrosis) Progressive interstitial lung disease on physical exam with desaturation on exertion in the office. This patient likely is progressing with pulmonary fibrosis. Previously this is been discerned to be idiopathic. Plan Labs today for fibrosis assessment CT chest  Lung function study No changes in medications An ONO will be obtained Return 6 weeks  Updated Medication List Outpatient Encounter Prescriptions as of 08/02/2014  Medication Sig  . albuterol (PROVENTIL HFA;VENTOLIN HFA) 108 (90 BASE) MCG/ACT inhaler Inhale 2 puffs into the lungs every 6 (six) hours as needed for wheezing or shortness of breath.  . ALPRAZolam (XANAX) 0.25 MG tablet TAKE 1 TABLET BY MOUTH DAILY AS NEEDED  . amLODipine (NORVASC) 5 MG tablet Take 0.5 tablets (2.5 mg total) by mouth daily.  . calcium citrate (CALCITRATE - DOSED IN MG ELEMENTAL CALCIUM) 950 MG tablet Take 200 mg of elemental calcium by mouth daily. With Vitamin D #3 take 1 tab daily  .  citalopram (CELEXA) 20 MG tablet TAKE 1 TABLET BY MOUTH EVERY DAY  . finasteride (PROSCAR) 5 MG tablet Take 5 mg by mouth daily.   . Garlic 1281 MG CAPS Take by mouth. 1 tab daily  . lisinopril (PRINIVIL,ZESTRIL) 10 MG tablet Take 0.5 tablets (5 mg total) by mouth daily.  . Magnesium 250 MG TABS Take by mouth. Take 1 tablet daily  . metoprolol succinate (TOPROL-XL) 25 MG 24 hr tablet TAKE 1/2 TABLET TWICE A DAY  . Multiple Vitamins-Minerals (EYE VITAMINS) CAPS Take by mouth.  . Omega-3 Fatty Acids (FISH OIL) 1200 MG CAPS Take 1 capsule by mouth daily.  Marland Kitchen omeprazole (PRILOSEC) 40 MG capsule TAKE ONE CAPSULE BY MOUTH EVERY DAY  . simvastatin (ZOCOR) 40 MG tablet Take 1 tablet (40 mg total) by mouth at bedtime.  . vitamin E 400 UNIT capsule Take 400 Units by mouth daily. Take 1 tab daily  . [DISCONTINUED] simvastatin (ZOCOR) 40 MG tablet Take 1 tablet (40 mg total) by mouth at bedtime. (Patient not taking: Reported on 08/02/2014)

## 2014-08-03 LAB — SEDIMENTATION RATE: Sed Rate: 5 mm/hr (ref 0–20)

## 2014-08-03 NOTE — Assessment & Plan Note (Signed)
Progressive interstitial lung disease on physical exam with desaturation on exertion in the office. This patient likely is progressing with pulmonary fibrosis. Previously this is been discerned to be idiopathic. Plan Labs today for fibrosis assessment CT chest  Lung function study No changes in medications An ONO will be obtained Return 6 weeks

## 2014-08-06 ENCOUNTER — Telehealth: Payer: Self-pay | Admitting: Internal Medicine

## 2014-08-06 NOTE — Telephone Encounter (Signed)
Please make sure has appt on Thursday

## 2014-08-06 NOTE — Telephone Encounter (Signed)
F/U        Pt calling back.   States he wants to be seen today or ASAP in regards to complaint.   Please return call.

## 2014-08-06 NOTE — Telephone Encounter (Signed)
New message        C/o left shoulder pain down to elbow for about a week   please give pt wife a call back

## 2014-08-06 NOTE — Telephone Encounter (Signed)
Patient is complaining of pain in left arm and around left shoulder blade for one week. Patient denies any chest pain or SOB. Patient woke up in a sweat last night and had difficulty sleeping. Patient wants to know if he should come in for an EKG to make sure everything is okay. Patient thinks this might be muscle. Informed patient I would forward to Dr. Harrington Challenger for further instructions.

## 2014-08-06 NOTE — Telephone Encounter (Signed)
Patient called earlier re back pain  Behind left scapula  Not associated with activit  No SOB Concerning to him  Similar to before a cath No SOB   Last spell was early today  Did usuall activity between  Recomm  1.  Set to see me on Thursday this week  Add on    2.  Call in NTG to [facility]  3  Told patinet to go to ER if gets bad

## 2014-08-07 MED ORDER — NITROGLYCERIN 0.4 MG SL SUBL: 0.4000 mg | SUBLINGUAL_TABLET | SUBLINGUAL | Status: DC | PRN

## 2014-08-07 NOTE — Telephone Encounter (Signed)
Scheduler will call pt to place on Dr Alan Ripper Thursday schedule. Rx was sent to pharmacy.

## 2014-08-09 ENCOUNTER — Ambulatory Visit (INDEPENDENT_AMBULATORY_CARE_PROVIDER_SITE_OTHER): Payer: Medicare Other | Admitting: Internal Medicine

## 2014-08-09 ENCOUNTER — Encounter: Payer: Self-pay | Admitting: Internal Medicine

## 2014-08-09 VITALS — BP 115/70 | HR 64 | Ht 72.0 in | Wt 231.0 lb

## 2014-08-09 DIAGNOSIS — I10 Essential (primary) hypertension: Secondary | ICD-10-CM

## 2014-08-09 NOTE — Progress Notes (Signed)
Cardiology Office Note   Date:  08/09/2014   ID:  LOWRY BALA, DOB [birth year], MRN [id number]  PCP:  Cathlean Cower, MD  Cardiologist:   Dorris Carnes, MD   Chief Complaint  Patient presents with  . Appointment    EKG PERFORMED      History of Present Illness: Wayne Torres is a 74 y.o. male with a history of          f coronary artery disease. Most recent rig ght/leaft heart cath Hemodynamics Showed RA 7; PA 36/9; PCWP 8 Cardiac index 2.105mmin/m2 Coronary dominance: right Left main: 99% mid stenosis.  Left anterior descending (LAD): 100% occlusion at the ostium.  Left circumflex (LCx): 100% occlusion proximally.  Right coronary artery (RCA): The right coronary is a dominant vessel. It has 50-60% stenosis in the proximal vessel followed by 50-60% segmental disease in the mid vessel. The PL OM is occluded proximally. The PDA is without significant disease. There are left to right and right to right collaterals to the PL OM.  The saphenous vein graft to the first diagonal is patent. There is 20-30% disease in the proximal vein graft. After the graft touchdown there is a 90% stenosis in the mid to distal diagonal. The vessel is very small in caliber in this location.  The saphenous vein graft to the obtuse marginal vessel is patent. There is 40-50% stenosis in the proximal vein graft. There is aneurysmal dilatation in the distal vein graft. The obtuse marginal vessel is fairly small.  The saphenous vein graft to the posterior lateral branch of the right coronary is occluded proximally.  The LIMA graft to the LAD is patent  Left ventriculography: Left ventricular systolic function is abnormal. Left ventricle is dilated with inferior wall akinesis and severe global hypokinesis. Ejection fraction is estimated at 30% there is no significant mitral regurgitation  Compared to his prior study in 2010 there is no significant change in his coronary circulation. His LV function is  decreased. Plan was for continued medical Rx        patinet called on Monday saying he hurt under L scapula  Concerned because he said it was similar in quality to previous angina Not assocaited with activity  No SOB Since seen on reflecting he says that it is prob related to pulling garbage cans  Says that he pulled 2 at once  When he turns in bed it can hurt more  Breathing is stable  He is being seen by PTia Masker     Current Outpatient Prescriptions  Medication Sig Dispense Refill  . amLODipine (NORVASC) 5 MG tablet Take 0.5 tablets (2.5 mg total) by mouth daily. 90 tablet 3  . calcium citrate (CALCITRATE - DOSED IN MG ELEMENTAL CALCIUM) 950 MG tablet Take 200 mg of elemental calcium by mouth daily. With Vitamin D #3 take 1 tab daily    . citalopram (CELEXA) 20 MG tablet TAKE 1 TABLET BY MOUTH EVERY DAY 90 tablet 3  . finasteride (PROSCAR) 5 MG tablet Take 5 mg by mouth daily.     . Garlic 11093MG CAPS Take by mouth. 1 tab daily    . lisinopril (PRINIVIL,ZESTRIL) 10 MG tablet Take 0.5 tablets (5 mg total) by mouth daily. 90 tablet 3  . Magnesium 250 MG TABS Take by mouth. Take 1 tablet daily    . metoprolol succinate (TOPROL-XL) 25 MG 24 hr tablet TAKE 1/2 TABLET TWICE A DAY 90 tablet 3  . Multiple  Vitamins-Minerals (EYE VITAMINS) CAPS Take by mouth.    . Omega-3 Fatty Acids (FISH OIL) 1200 MG CAPS Take 1 capsule by mouth daily.    Marland Kitchen omeprazole (PRILOSEC) 40 MG capsule TAKE ONE CAPSULE BY MOUTH EVERY DAY 90 capsule 3  . simvastatin (ZOCOR) 40 MG tablet Take 1 tablet (40 mg total) by mouth at bedtime. 90 tablet 3  . vitamin E 400 UNIT capsule Take 400 Units by mouth daily. Take 1 tab daily    . albuterol (PROVENTIL HFA;VENTOLIN HFA) 108 (90 BASE) MCG/ACT inhaler Inhale 2 puffs into the lungs every 6 (six) hours as needed for wheezing or shortness of breath. (Patient not taking: Reported on 08/09/2014) 1 Inhaler 11  . ALPRAZolam (XANAX) 0.25 MG tablet TAKE 1 TABLET BY MOUTH DAILY AS  NEEDED (Patient not taking: Reported on 08/09/2014) 30 tablet 2  . nitroGLYCERIN (NITROSTAT) 0.4 MG SL tablet Place 1 tablet (0.4 mg total) under the tongue every 5 (five) minutes as needed for chest pain. (Patient not taking: Reported on 08/09/2014) 90 tablet 3   No current facility-administered medications for this visit.    Allergies:   Review of patient's allergies indicates no known allergies.   Past Medical History  Diagnosis Date  . GI bleed   . Shoulder joint pain   . Coronary artery disease s/p CABG 1990s     a. approx 1994 s/p CABG x 4 (LIMA->LAD, VG->RCA, VG->RI, VG->Diag);  b. 11/2008 Cath: LM 100, LAD small, RCA 50-60p, 100d, VG->RCA 100 ost, VG->RI 5m VG->Diag 40 - upper branch of diag small, 99%, LIMA-LAD patent.;  c. 08/2011 Echo: EF 45-50%, mod dil LA,/RA/RV with mild-mod reduced RV fxn.  . Back pain   . Hip pain, right   . PSA (psoriatic arthritis)     increased  . Lumbar disc disease   . Malignant neoplasm of prostate   . Fatigue   . Pulmonary fibrosis, postinflammatory     Stopped Cellcept 02/13/13 d/t cost   . Renal calculus   . Osteoarthritis   . HTN (hypertension)   . Depression   . Anxiety   . ED (erectile dysfunction)   . Hyperlipidemia   . GERD (gastroesophageal reflux disease)   . Allergic rhinitis   . Peptic stricture of esophagus   . Complete heart block     a. s/p SJM Accent dc ppm, ser # [id number] . Chronic bronchitis 02/07/2012  . PVD (peripheral vascular disease)     a. 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  bilat ext iliac dzs.  . Pneumonia 1993    post-op  . Allergic rhinitis, cause unspecified 02/28/2013    Past Surgical History  Procedure Laterality Date  . Coronary angioplasty  06/03/2007, 01/25/2009  . Nasal septum surgery  april 2012    dr britt, [facility] [place]  . Coronary artery bypass graft  20 yrs ago    LIMA to LAD, SVG to PDA, SVG to Ramus, SVG to D  . Pacemaker placement  08/2011  . Permanent  pacemaker insertion N/A 09/14/2011    Procedure: PERMANENT PACEMAKER INSERTION;  Surgeon: SDeboraha Sprang MD;  [facility];  Service: Cardiovascular;  Laterality: N/A;  . Left and right heart catheterization with coronary angiogram N/A 07/05/2012    Procedure: LEFT AND RIGHT HEART CATHETERIZATION WITH CORONARY ANGIOGRAM;  Surgeon: Peter M JMartinique MD;  [facility];  Service: Cardiovascular;  Laterality: N/A;     Social  History:  The patient  reports that he quit smoking about 12 years ago. His smoking use included Cigarettes. He has a 63 pack-year smoking history. He has never used smokeless tobacco. He reports that he does not drink alcohol or use illicit drugs.   Family History:  The patient's family history includes Heart attack in his father; Heart disease in his father. There is no history of Colon cancer or Cancer.    ROS:  Please see the history of present illness. All other systems are reviewed and  Negative to the above problem except as noted.    PHYSICAL EXAM: VS:  BP 115/70 mmHg  Pulse 64  Ht 6' (1.829 m)  Wt 231 lb (104.781 kg)  BMI 31.32 kg/m2  GEN: Well nourished, well developed, in no acute distress HEENT: normal Neck: no JVD, carotid bruits, or masses Cardiac: RRR; no murmurs, rubs, or gallops,no edema  Respiratory:  clear to auscultation bilaterally, normal work of breathing GI: soft, nontender, nondistended, + BS  No hepatomegaly  MS: no deformity Moving all extremities   Skin: warm and dry, no rash Neuro:  Strength and sensation are intact Psych: euthymic mood, full affect   EKG:  EKG is ordered today.  SR 64  Paced  PVC     Lipid Panel    Component Value Date/Time   CHOL 104 05/01/2014 1717   TRIG 230.0* 05/01/2014 1717   HDL 27.80* 05/01/2014 1717   CHOLHDL 4 05/01/2014 1717   VLDL 46.0* 05/01/2014 1717   LDLCALC 45 02/28/2013 1227   LDLDIRECT 49.4 05/01/2014 1717      Wt Readings from Last 3 Encounters:  08/09/14 231 lb  (104.781 kg)  08/02/14 231 lb (104.781 kg)  05/01/14 239 lb (108.41 kg)      ASSESSMENT AND PLAN:  1  CAD  His symptoms of back pain are atypical for angina  Most likely musculoskeletal  Reassured pt  No change in meds    2.  Pulm  3.  HL  Good control of lipids  4.  HTN  Good control      Current medicines are reviewed at length with the patient today.  The patient does not have concerns regarding medicines.  The following changes have been made: None    Labs/ tests ordered today include:  None   No orders of the defined types were placed in this encounter.     Disposition:   FU with me in November as scheduled  Signed, Dorris Carnes, MD  08/09/2014 10:28 AM    [address], [place], [place]  [address] Phone: [phone]; Fax: [phone]

## 2014-08-09 NOTE — Patient Instructions (Signed)
Your physician recommends that you continue on your current medications as directed. Please refer to the Current Medication list given to you today. Your physician wants you to follow-up in: November with Dr. Harrington Challenger.  You will receive a reminder letter in the mail two months in advance. If you don't receive a letter, please call our office to schedule the follow-up appointment.

## 2014-08-21 ENCOUNTER — Ambulatory Visit (INDEPENDENT_AMBULATORY_CARE_PROVIDER_SITE_OTHER): Payer: Medicare Other | Admitting: Critical Care Medicine

## 2014-08-21 DIAGNOSIS — J84112 Idiopathic pulmonary fibrosis: Secondary | ICD-10-CM | POA: Diagnosis not present

## 2014-08-21 LAB — PULMONARY FUNCTION TEST
DL/VA % PRED: 43 %
DL/VA: 2.04 ml/min/mmHg/L
DLCO UNC % PRED: 22 %
DLCO UNC: 7.99 ml/min/mmHg
FEF 25-75 Post: 3.09 L/sec
FEF 25-75 Pre: 1.51 L/sec
FEF2575-%CHANGE-POST: 104 %
FEF2575-%PRED-POST: 121 %
FEF2575-%Pred-Pre: 59 %
FEV1-%CHANGE-POST: 14 %
FEV1-%Pred-Post: 71 %
FEV1-%Pred-Pre: 62 %
FEV1-POST: 2.43 L
FEV1-Pre: 2.12 L
FEV1FVC-%Change-Post: 4 %
FEV1FVC-%Pred-Pre: 104 %
FEV6-%Change-Post: 9 %
FEV6-%PRED-PRE: 62 %
FEV6-%Pred-Post: 69 %
FEV6-Post: 3.04 L
FEV6-Pre: 2.78 L
FEV6FVC-%Change-Post: 0 %
FEV6FVC-%PRED-POST: 105 %
FEV6FVC-%PRED-PRE: 105 %
FVC-%Change-Post: 9 %
FVC-%PRED-PRE: 59 %
FVC-%Pred-Post: 65 %
FVC-PRE: 2.8 L
FVC-Post: 3.06 L
POST FEV1/FVC RATIO: 80 %
PRE FEV1/FVC RATIO: 76 %
Post FEV6/FVC ratio: 100 %
Pre FEV6/FVC Ratio: 99 %

## 2014-08-21 NOTE — Progress Notes (Signed)
PFT done today. 

## 2014-08-22 NOTE — Progress Notes (Signed)
Quick Note:  Spoke with pt. Discussed PFT results per Dr. Joya Gaskins. Pt states he is scheduled to do ONO tonight and CT is scheduled for Monday. He is aware we will call him with these results once received. He verbalized understanding and voiced no further questions or concerns at this time. Will route to PW as FYI and to myself to follow ONO and CT results. ______

## 2014-08-22 NOTE — Progress Notes (Signed)
Quick Note:  Spoke with family member. She will ask pt to call office back when he is available. ______

## 2014-08-23 ENCOUNTER — Encounter: Payer: Self-pay | Admitting: Internal Medicine

## 2014-08-27 ENCOUNTER — Telehealth: Payer: Self-pay | Admitting: Critical Care Medicine

## 2014-08-27 ENCOUNTER — Ambulatory Visit (HOSPITAL_BASED_OUTPATIENT_CLINIC_OR_DEPARTMENT_OTHER)
Admission: RE | Admit: 2014-08-27 | Discharge: 2014-08-27 | Disposition: A | Discharge status: Home / Self Care | Payer: Medicare Other | Source: Ambulatory Visit | Attending: Critical Care Medicine | Admitting: Critical Care Medicine

## 2014-08-27 DIAGNOSIS — Z87891 Personal history of nicotine dependence: Secondary | ICD-10-CM | POA: Insufficient documentation

## 2014-08-27 DIAGNOSIS — J84112 Idiopathic pulmonary fibrosis: Secondary | ICD-10-CM | POA: Diagnosis not present

## 2014-08-27 NOTE — Telephone Encounter (Signed)
I am aware of results and called the pt

## 2014-08-28 ENCOUNTER — Telehealth: Payer: Self-pay | Admitting: Critical Care Medicine

## 2014-08-28 ENCOUNTER — Other Ambulatory Visit: Payer: Self-pay | Admitting: Critical Care Medicine

## 2014-08-28 DIAGNOSIS — J84112 Idiopathic pulmonary fibrosis: Secondary | ICD-10-CM

## 2014-08-28 DIAGNOSIS — C3490 Malignant neoplasm of unspecified part of unspecified bronchus or lung: Secondary | ICD-10-CM | POA: Insufficient documentation

## 2014-08-28 DIAGNOSIS — R918 Other nonspecific abnormal finding of lung field: Secondary | ICD-10-CM

## 2014-08-28 NOTE — Telephone Encounter (Signed)
Order was placed for nocturnal O2 start @ 3L/min. [facility] is needing this placed in their template.  Please have pt start o2 3lpm with sleep  [facility]        New order placed and Melissa is aware. Nothing further needed.

## 2014-08-28 NOTE — Telephone Encounter (Signed)
lmtcb x1 

## 2014-08-28 NOTE — Telephone Encounter (Signed)
Ria Comment spoke with the pt and notified of recs per PW  Pt verbalized understanding  Orders sent to [facility]

## 2014-08-28 NOTE — Telephone Encounter (Signed)
Pt with ONO on RA with LOW saturations at night He needs to START oxygen 3Liters at bedtime Need to REPEAT ONO on 3 Liters QHS Use [facility]

## 2014-08-30 ENCOUNTER — Ambulatory Visit (HOSPITAL_COMMUNITY)
Admission: RE | Admit: 2014-08-30 | Discharge: 2014-08-30 | Disposition: A | Discharge status: Home / Self Care | Payer: Medicare Other | Source: Ambulatory Visit | Attending: Critical Care Medicine | Admitting: Critical Care Medicine

## 2014-08-30 DIAGNOSIS — Z79899 Other long term (current) drug therapy: Secondary | ICD-10-CM | POA: Diagnosis not present

## 2014-08-30 DIAGNOSIS — J84112 Idiopathic pulmonary fibrosis: Secondary | ICD-10-CM | POA: Diagnosis not present

## 2014-08-30 DIAGNOSIS — R918 Other nonspecific abnormal finding of lung field: Secondary | ICD-10-CM | POA: Insufficient documentation

## 2014-08-30 LAB — GLUCOSE, CAPILLARY: Glucose-Capillary: 125 mg/dL — ABNORMAL HIGH (ref 70–99)

## 2014-08-30 MED ORDER — FLUDEOXYGLUCOSE F - 18 (FDG) INJECTION
11.6000 | Freq: Once | INTRAVENOUS | Status: AC | PRN
  Administered 2014-08-30: 11.6 via INTRAVENOUS

## 2014-09-03 ENCOUNTER — Telehealth: Payer: Self-pay | Admitting: Critical Care Medicine

## 2014-09-03 NOTE — Telephone Encounter (Signed)
Staff msg sent to [facility] with [facility] requesting ONO results to be faxed to triage.

## 2014-09-03 NOTE — Telephone Encounter (Signed)
Patient's wife called to check on ONO results on 3L.  No results are in chart.  Crystal, do you have the results?  To Crystal to follow up.

## 2014-09-03 NOTE — Telephone Encounter (Signed)
Will review

## 2014-09-03 NOTE — Telephone Encounter (Signed)
Results received and placed in Dr. Bettina Gavia folder for review.

## 2014-09-04 ENCOUNTER — Telehealth: Payer: Self-pay | Admitting: Critical Care Medicine

## 2014-09-04 ENCOUNTER — Encounter (HOSPITAL_COMMUNITY): Payer: Self-pay | Admitting: *Deleted

## 2014-09-04 NOTE — Telephone Encounter (Signed)
Per Dr. Joya Gaskins: below results are based upon ONO on RA.   PW reviewed ONO on 3lpm.  WNL.  Pt needs to stay on 3lpm qhs. Results placed in scan folder. Lm with family member for pt to call office back.

## 2014-09-04 NOTE — Progress Notes (Signed)
Anesthesia Chart Review:  SAME DAY WORK-UP.  Patient is a 74 year old male scheduled for bronchoscopy with EBUS, transbronchial biopsies tomorrow by Dr. Lamonte Sakai. Patient with known pulmonary fibrosis with recent chest T showing a LUL mass hypermetabolic by PET.  History includes former smoker, CAD s/p CABG '90's, CHB s/p 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 OSA screening score is 4 or greater. PCP is Dr. Cathlean Cower. Primary cardiologist is Dr. Dorris Carnes, last visit 08/09/14 for atypical chest pain.  Patient was reassured and no new testing was ordered by Dr.Ross. EP cardiologist is Dr. Virl Axe, last visit 12/06/13. His note said he would consider CRT upgrade if patient became more symptomatic of his CHF. (Recommendations to continue to follow after his 12/20/13 echo.) Pulmonologist is Dr. Asencion Noble.   Meds include albuterol, Xanax, amlodipine, Celexa, Proscar, lisinopril, Toprol, Nitro, fish oil, omeprazole, Zocor, Flomax.  12/20/13 Echo: - Left ventricle: The cavity size was moderately dilated. There was moderate concentric hypertrophy. Systolic function was moderately reduced. The estimated ejection fraction was in the range of 35% to 40%. Wall motion was normal; there were no regional wallmotion abnormalities. Doppler parameters are consistent withabnormal left ventricular relaxation (grade 1 diastolicdysfunction). - Aortic valve: Trileaflet; normal thickness leaflets. There was no regurgitation. - Mitral valve: There was no regurgitation. - Left atrium: The atrium was moderately dilated. - Right ventricle: Systolic function was normal. - Tricuspid valve: There was no regurgitation. (Previous EF 30% 07/05/12 by cath, 40-45% 05/30/12 by echo, 45-50% 09/13/11 by echo.)  07/05/12 Cardiac Cath (done to abnormal Myoview  showing inferior scar with ischemia, EF 31%):  Final Conclusions:  1. Severe three-vessel obstructive coronary disease. 2. Occluded saphenous vein graft to the PL OM. 3. Other grafts are patent including LIMA graft to the LAD, saphenous vein graft to the diagonal, and saphenous vein graft to the obtuse marginal vessel. 4. Severe left ventricular dysfunction. EF 30%. 5. Normal right heart pressures. Recommendations: Recommend continued medical management. Compared to his prior study in 2010 there is no significant change in his coronary circulation. His LV function is decreased.  08/09/14 EKG: SR, occasional PVC, non-specific IVCB, possible lateral infarct (age undetermined), possible inferior infarct (age undetermined). Paced rhythm per Dr. Harrington Challenger' note.  08/27/14 Chest CT w/o contrast: IMPRESSION: 1. New macrolobulated left upper lobe mass highly concerning for primary bronchogenic neoplasm. Correlation with PET-CT and/or biopsy is recommended for diagnostic and staging purposes at this time. No definite mediastinal or hilar lymphadenopathy noted on today's noncontrast CT examination. 2. The appearance of the lungs is unusual, and appears to reflect a combination of moderate to severe centrilobular and paraseptal emphysema, as well as some associated interstitial lung disease. The pattern of interstitial lung findings there is a indeterminate, the but suspicious for potential usual interstitial pneumonia (UIP) given the slight progression compared to prior examinations and the presence of areas of apparent honeycombing. 3. Atherosclerosis, including left main and 3 vessel coronary artery disease. Assessment for potential risk factor modification, dietary therapy or pharmacologic therapy may be warranted, if clinically indicated.  08/21/14 PFTs: FVC 2.80 (59%), FEV1 2.12 (62%), FEF 25-75 1.51 (59%), DLCOunc 7.99 (22%).   He is for labs on arrival.   He has known pulmonary fibrosis, CAD, PPM.  His  pulmonologist arranged this procedure, and he has had recent cardiology evaluation.  If no acute  changes then I would anticipate that he could proceed as planned.  George Hugh [facility] Short Stay Center/Anesthesiology Phone [phone] 09/04/2014 12:50 PM

## 2014-09-04 NOTE — Telephone Encounter (Signed)
Noted , yes .

## 2014-09-04 NOTE — Telephone Encounter (Signed)
Make sure pt has nocturnal oxygen  ono showed mult. desats

## 2014-09-04 NOTE — Telephone Encounter (Signed)
Please see phone msg from 09/04/14 for additional information regarding ONO results.

## 2014-09-04 NOTE — Telephone Encounter (Signed)
Pt is aware of results of ONO. Nothing further was needed.

## 2014-09-04 NOTE — Progress Notes (Signed)
   09/04/14 0959  OBSTRUCTIVE SLEEP APNEA  Have you ever been diagnosed with sleep apnea through a sleep study? No  Do you snore loudly (loud enough to be heard through closed doors)?  1  Do you often feel tired, fatigued, or sleepy during the daytime? 0  Has anyone observed you stop breathing during your sleep? 0  Do you have, or are you being treated for high blood pressure? 1  BMI more than 35 kg/m2? 0  Age over 73 years old? 1  Neck circumference greater than 40 cm/16 inches? 1 (18.5)  Gender: 1

## 2014-09-05 ENCOUNTER — Ambulatory Visit (HOSPITAL_COMMUNITY): Payer: Medicare Other

## 2014-09-05 ENCOUNTER — Ambulatory Visit (HOSPITAL_COMMUNITY): Payer: Medicare Other | Admitting: Vascular Surgery

## 2014-09-05 ENCOUNTER — Ambulatory Visit (HOSPITAL_COMMUNITY)
Admission: RE | Admit: 2014-09-05 | Discharge: 2014-09-05 | Disposition: A | Discharge status: Home / Self Care | Payer: Medicare Other | Source: Ambulatory Visit | Attending: Emergency Medicine | Admitting: Emergency Medicine

## 2014-09-05 ENCOUNTER — Encounter (HOSPITAL_COMMUNITY)
Admission: RE | Disposition: A | Discharge status: Home / Self Care | Payer: Self-pay | Source: Ambulatory Visit | Attending: Emergency Medicine

## 2014-09-05 DIAGNOSIS — Z8546 Personal history of malignant neoplasm of prostate: Secondary | ICD-10-CM | POA: Diagnosis not present

## 2014-09-05 DIAGNOSIS — I739 Peripheral vascular disease, unspecified: Secondary | ICD-10-CM | POA: Insufficient documentation

## 2014-09-05 DIAGNOSIS — F319 Bipolar disorder, unspecified: Secondary | ICD-10-CM | POA: Insufficient documentation

## 2014-09-05 DIAGNOSIS — Z87442 Personal history of urinary calculi: Secondary | ICD-10-CM | POA: Insufficient documentation

## 2014-09-05 DIAGNOSIS — Z87891 Personal history of nicotine dependence: Secondary | ICD-10-CM | POA: Diagnosis not present

## 2014-09-05 DIAGNOSIS — J849 Interstitial pulmonary disease, unspecified: Secondary | ICD-10-CM | POA: Diagnosis not present

## 2014-09-05 DIAGNOSIS — R590 Localized enlarged lymph nodes: Secondary | ICD-10-CM | POA: Diagnosis not present

## 2014-09-05 DIAGNOSIS — M1990 Unspecified osteoarthritis, unspecified site: Secondary | ICD-10-CM | POA: Diagnosis not present

## 2014-09-05 DIAGNOSIS — E785 Hyperlipidemia, unspecified: Secondary | ICD-10-CM | POA: Insufficient documentation

## 2014-09-05 DIAGNOSIS — I2510 Atherosclerotic heart disease of native coronary artery without angina pectoris: Secondary | ICD-10-CM | POA: Insufficient documentation

## 2014-09-05 DIAGNOSIS — Z951 Presence of aortocoronary bypass graft: Secondary | ICD-10-CM | POA: Insufficient documentation

## 2014-09-05 DIAGNOSIS — R918 Other nonspecific abnormal finding of lung field: Secondary | ICD-10-CM | POA: Diagnosis not present

## 2014-09-05 DIAGNOSIS — Z9889 Other specified postprocedural states: Secondary | ICD-10-CM

## 2014-09-05 DIAGNOSIS — Z419 Encounter for procedure for purposes other than remedying health state, unspecified: Secondary | ICD-10-CM

## 2014-09-05 DIAGNOSIS — R911 Solitary pulmonary nodule: Secondary | ICD-10-CM | POA: Insufficient documentation

## 2014-09-05 DIAGNOSIS — J8410 Pulmonary fibrosis, unspecified: Secondary | ICD-10-CM | POA: Diagnosis not present

## 2014-09-05 DIAGNOSIS — F419 Anxiety disorder, unspecified: Secondary | ICD-10-CM | POA: Diagnosis not present

## 2014-09-05 DIAGNOSIS — I10 Essential (primary) hypertension: Secondary | ICD-10-CM | POA: Insufficient documentation

## 2014-09-05 DIAGNOSIS — K219 Gastro-esophageal reflux disease without esophagitis: Secondary | ICD-10-CM | POA: Insufficient documentation

## 2014-09-05 DIAGNOSIS — C3490 Malignant neoplasm of unspecified part of unspecified bronchus or lung: Secondary | ICD-10-CM | POA: Diagnosis present

## 2014-09-05 HISTORY — DX: Bipolar disorder, unspecified: F31.9

## 2014-09-05 HISTORY — PX: VIDEO BRONCHOSCOPY WITH ENDOBRONCHIAL ULTRASOUND: SHX6177

## 2014-09-05 HISTORY — DX: Personal history of urinary calculi: Z87.442

## 2014-09-05 LAB — COMPREHENSIVE METABOLIC PANEL
ALT: 13 U/L (ref 0–53)
AST: 22 U/L (ref 0–37)
Albumin: 3.4 g/dL — ABNORMAL LOW (ref 3.5–5.2)
Alkaline Phosphatase: 77 U/L (ref 39–117)
Anion gap: 15 (ref 5–15)
BUN: 19 mg/dL (ref 6–23)
CHLORIDE: 102 mmol/L (ref 96–112)
CO2: 20 mmol/L (ref 19–32)
Calcium: 8.9 mg/dL (ref 8.4–10.5)
Creatinine, Ser: 1.25 mg/dL (ref 0.50–1.35)
GFR calc Af Amer: 65 mL/min — ABNORMAL LOW (ref >90)
GFR calc non Af Amer: 56 mL/min — ABNORMAL LOW (ref >90)
Glucose, Bld: 114 mg/dL — ABNORMAL HIGH (ref 70–99)
Potassium: 3.9 mmol/L (ref 3.5–5.1)
SODIUM: 137 mmol/L (ref 135–145)
TOTAL PROTEIN: 7.7 g/dL (ref 6.0–8.3)
Total Bilirubin: 0.7 mg/dL (ref 0.3–1.2)

## 2014-09-05 LAB — APTT: aPTT: 31 seconds (ref 24–37)

## 2014-09-05 LAB — CBC
HCT: 45.3 % (ref 39.0–52.0)
HEMOGLOBIN: 14.4 g/dL (ref 13.0–17.0)
MCH: 29.6 pg (ref 26.0–34.0)
MCHC: 31.8 g/dL (ref 30.0–36.0)
MCV: 93 fL (ref 78.0–100.0)
Platelets: 302 10*3/uL (ref 150–400)
RBC: 4.87 MIL/uL (ref 4.22–5.81)
RDW: 13.8 % (ref 11.5–15.5)
WBC: 11.2 10*3/uL — ABNORMAL HIGH (ref 4.0–10.5)

## 2014-09-05 LAB — PROTIME-INR
INR: 1.13 (ref 0.00–1.49)
Prothrombin Time: 14.6 seconds (ref 11.6–15.2)

## 2014-09-05 SURGERY — BRONCHOSCOPY, WITH EBUS
Anesthesia: General | Site: Bronchus | Laterality: Left

## 2014-09-05 MED ORDER — ONDANSETRON HCL 4 MG/2ML IJ SOLN
INTRAMUSCULAR | Status: DC | PRN
  Administered 2014-09-05: 4 mg via INTRAVENOUS

## 2014-09-05 MED ORDER — ROCURONIUM BROMIDE 100 MG/10ML IV SOLN
INTRAVENOUS | Status: DC | PRN
  Administered 2014-09-05: 10 mg via INTRAVENOUS
  Administered 2014-09-05: 40 mg via INTRAVENOUS

## 2014-09-05 MED ORDER — NEOSTIGMINE METHYLSULFATE 10 MG/10ML IV SOLN
INTRAVENOUS | Status: DC | PRN
  Administered 2014-09-05: 5 mg via INTRAVENOUS

## 2014-09-05 MED ORDER — 0.9 % SODIUM CHLORIDE (POUR BTL) OPTIME
TOPICAL | Status: DC | PRN
  Administered 2014-09-05: 1000 mL

## 2014-09-05 MED ORDER — VECURONIUM BROMIDE 10 MG IV SOLR
INTRAVENOUS | Status: DC | PRN
  Administered 2014-09-05: 1 mg via INTRAVENOUS

## 2014-09-05 MED ORDER — PROPOFOL 10 MG/ML IV BOLUS
INTRAVENOUS | Status: AC
  Filled 2014-09-05: qty 20

## 2014-09-05 MED ORDER — LIDOCAINE HCL (CARDIAC) 20 MG/ML IV SOLN
INTRAVENOUS | Status: DC | PRN
  Administered 2014-09-05: 40 mg via INTRAVENOUS

## 2014-09-05 MED ORDER — EPHEDRINE SULFATE 50 MG/ML IJ SOLN
INTRAMUSCULAR | Status: DC | PRN
Start: 2014-09-05 — End: 2014-09-05
  Administered 2014-09-05: 10 mg via INTRAVENOUS
  Administered 2014-09-05: 15 mg via INTRAVENOUS
  Administered 2014-09-05: 10 mg via INTRAVENOUS
  Administered 2014-09-05: 15 mg via INTRAVENOUS

## 2014-09-05 MED ORDER — PHENYLEPHRINE HCL 10 MG/ML IJ SOLN
10.0000 mg | INTRAVENOUS | Status: DC | PRN
  Administered 2014-09-05: 25 ug/min via INTRAVENOUS

## 2014-09-05 MED ORDER — MIDAZOLAM HCL 5 MG/5ML IJ SOLN
INTRAMUSCULAR | Status: DC | PRN
Start: 2014-09-05 — End: 2014-09-05
  Administered 2014-09-05: 1 mg via INTRAVENOUS

## 2014-09-05 MED ORDER — FENTANYL CITRATE 0.05 MG/ML IJ SOLN: 25.0000 ug | INTRAMUSCULAR | Status: DC | PRN

## 2014-09-05 MED ORDER — PROPOFOL 10 MG/ML IV BOLUS
INTRAVENOUS | Status: DC | PRN
  Administered 2014-09-05: 10 mg via INTRAVENOUS
  Administered 2014-09-05: 130 mg via INTRAVENOUS
  Administered 2014-09-05: 50 mg via INTRAVENOUS

## 2014-09-05 MED ORDER — PHENYLEPHRINE HCL 10 MG/ML IJ SOLN
INTRAMUSCULAR | Status: DC | PRN
  Administered 2014-09-05 (×2): 120 ug via INTRAVENOUS
  Administered 2014-09-05 (×2): 80 ug via INTRAVENOUS

## 2014-09-05 MED ORDER — SUCCINYLCHOLINE CHLORIDE 20 MG/ML IJ SOLN
INTRAMUSCULAR | Status: DC | PRN
  Administered 2014-09-05: 120 mg via INTRAVENOUS

## 2014-09-05 MED ORDER — ONDANSETRON HCL 4 MG/2ML IJ SOLN: 4.0000 mg | Freq: Once | INTRAMUSCULAR | Status: DC | PRN

## 2014-09-05 MED ORDER — FENTANYL CITRATE 0.05 MG/ML IJ SOLN
INTRAMUSCULAR | Status: DC | PRN
  Administered 2014-09-05 (×2): 50 ug via INTRAVENOUS

## 2014-09-05 MED ORDER — LACTATED RINGERS IV SOLN
INTRAVENOUS | Status: DC
  Administered 2014-09-05 (×2): via INTRAVENOUS

## 2014-09-05 MED ORDER — GLYCOPYRROLATE 0.2 MG/ML IJ SOLN
INTRAMUSCULAR | Status: DC | PRN
  Administered 2014-09-05: .8 mg via INTRAVENOUS

## 2014-09-05 MED ORDER — MIDAZOLAM HCL 2 MG/2ML IJ SOLN
INTRAMUSCULAR | Status: AC
  Filled 2014-09-05: qty 2

## 2014-09-05 MED ORDER — FENTANYL CITRATE 0.05 MG/ML IJ SOLN
INTRAMUSCULAR | Status: AC
  Filled 2014-09-05: qty 5

## 2014-09-05 SURGICAL SUPPLY — 28 items
BRUSH CYTOL CELLEBRITY 1.5X140 (MISCELLANEOUS) IMPLANT
BRUSH CYTOL CELLEBRITY 1X140 (MISCELLANEOUS) ×2 IMPLANT
CANISTER SUCTION 2500CC (MISCELLANEOUS) ×3 IMPLANT
CONT SPEC 4OZ CLIKSEAL STRL BL (MISCELLANEOUS) ×3 IMPLANT
COVER TABLE BACK 60X90 (DRAPES) ×3 IMPLANT
FORCEPS BIOP RJ4 1.8 (CUTTING FORCEPS) ×2 IMPLANT
GAUZE SPONGE 4X4 12PLY STRL (GAUZE/BANDAGES/DRESSINGS) IMPLANT
GLOVE BIOGEL M STRL SZ7.5 (GLOVE) ×5 IMPLANT
GLOVE SURG SS PI 7.0 STRL IVOR (GLOVE) ×2 IMPLANT
GOWN STRL REUS W/ TWL LRG LVL3 (GOWN DISPOSABLE) ×1 IMPLANT
GOWN STRL REUS W/TWL LRG LVL3 (GOWN DISPOSABLE) ×6
KIT CLEAN ENDO COMPLIANCE (KITS) ×3 IMPLANT
KIT ROOM TURNOVER OR (KITS) ×3 IMPLANT
MARKER SKIN DUAL TIP RULER LAB (MISCELLANEOUS) ×3 IMPLANT
NDL BIOPSY TRANSBRONCH 21G (NEEDLE) IMPLANT
NEEDLE BIOPSY TRANSBRONCH 21G (NEEDLE) IMPLANT
NEEDLE SYS SONOTIP II EBUSTBNA (NEEDLE) ×4 IMPLANT
NS IRRIG 1000ML POUR BTL (IV SOLUTION) ×3 IMPLANT
OIL SILICONE PENTAX (PARTS (SERVICE/REPAIRS)) ×2 IMPLANT
PAD ARMBOARD 7.5X6 YLW CONV (MISCELLANEOUS) ×6 IMPLANT
SYR 20CC LL (SYRINGE) ×2 IMPLANT
SYR 20ML ECCENTRIC (SYRINGE) ×4 IMPLANT
SYR 5ML LUER SLIP (SYRINGE) ×3 IMPLANT
SYRINGE 20CC LL (MISCELLANEOUS) ×2 IMPLANT
TOWEL OR 17X24 6PK STRL BLUE (TOWEL DISPOSABLE) ×3 IMPLANT
TRAP SPECIMEN MUCOUS 40CC (MISCELLANEOUS) ×2 IMPLANT
TUBE CONNECTING 20'X1/4 (TUBING) ×1
TUBE CONNECTING 20X1/4 (TUBING) ×3 IMPLANT

## 2014-09-05 NOTE — Anesthesia Procedure Notes (Signed)
Procedure Name: Intubation Date/Time: 09/05/2014 10:54 AM Performed by: Neldon Newport Pre-anesthesia Checklist: Patient identified, Emergency Drugs available, Suction available and Patient being monitored Patient Re-evaluated:Patient Re-evaluated prior to inductionOxygen Delivery Method: Circle system utilized and Simple face mask Preoxygenation: Pre-oxygenation with 100% oxygen Intubation Type: IV induction Ventilation: Two handed mask ventilation required and Oral airway inserted - appropriate to patient size Laryngoscope Size: Glidescope and 4 Grade View: Grade I Tube type: Oral Tube size: 8.5 mm Number of attempts: 1 Airway Equipment and Method: Stylet and Oral airway Placement Confirmation: ETT inserted through vocal cords under direct vision,  positive ETCO2,  CO2 detector and breath sounds checked- equal and bilateral Secured at: 24 cm Tube secured with: Tape Dental Injury: Teeth and Oropharynx as per pre-operative assessment

## 2014-09-05 NOTE — Transfer of Care (Signed)
Immediate Anesthesia Transfer of Care Note  Patient: Wayne Torres  Procedure(s) Performed: Procedure(s): VIDEO BRONCHOSCOPY WITH ENDOBRONCHIAL ULTRASOUND (Left)  Patient Location: PACU  Anesthesia Type:General  Level of Consciousness: awake, alert  and oriented  Airway & Oxygen Therapy: Patient Spontanous Breathing and Patient connected to face mask oxygen  Post-op Assessment: Report given to RN, Post -op Vital signs reviewed and stable and Patient moving all extremities X 4  Post vital signs: Reviewed and stable  Last Vitals:  Filed Vitals:   09/05/14 0835  BP:   Pulse:   Temp: 36.6 C  Resp:     Complications: No apparent anesthesia complications

## 2014-09-05 NOTE — Op Note (Signed)
Video Bronchoscopy with Endobronchial Ultrasound Procedure Note  Date of Operation: 09/05/2014  Pre-op Diagnosis: LUL nodule and mediastinal lymphadenopathy  Post-op Diagnosis: same  Surgeon: Baltazar Apo  Assistants: none  Anesthesia: General endotracheal anesthesia  Operation: Flexible video fiberoptic bronchoscopy with endobronchial ultrasound and biopsies.  Estimated Blood Loss: 34KA  Complications: none apparent  Indications and History: Wayne Torres is a 74 y.o. male with a LUL nodule found on CT scan 08/27/14. A PET scan showed that this was hypermetabolic. Also seen was L perihilar and R paratracheal LAD. Recommendation was made to pursue tissue dx using transbronchial biopsies and EBUS for nodal sampling.  The risks, benefits, complications, treatment options and expected outcomes were discussed with the patient.  The possibilities of pneumothorax, pneumonia, reaction to medication, pulmonary aspiration, perforation of a viscus, bleeding, failure to diagnose a condition and creating a complication requiring transfusion or operation were discussed with the patient who freely signed the consent.    Description of Procedure: The patient was examined in the preoperative area and history and data from the preprocedure consultation were reviewed. It was deemed appropriate to proceed.  The patient was taken to OR 10, identified as [name] and the procedure verified as Flexible Video Fiberoptic Bronchoscopy.  A Time Out was held and the above information confirmed. After being taken to the operating room general anesthesia was initiated and the patient  was orally intubated. The video fiberoptic bronchoscope was introduced via the endotracheal tube and a general inspection was performed which showed normal airways throughout. The standard scope was then withdrawn and the endobronchial ultrasound was used to identify and characterize the peritracheal, hilar and bronchial lymph nodes.  Inspection showed enlargement of a 2R node, as well as 4L and 11 L nodes. Using real-time ultrasound guidance Wang needle biopsies were take from Stations 2R, 4L and 11L nodes and were sent for cytology.  The standard scope was then reintroduced and transbronchial LUL biopsies and brushings were obtained under fluoroscopic guidance. The patient tolerated the procedure well without apparent complications. There was some moderate LUL bleeding s/p biopsies that stopped spontaneously after 2-3 minutes. The bronchoscope was withdrawn. Anesthesia was reversed and the patient was taken to the PACU for recovery.   Samples: 1. Wang needle biopsies from 2R node 2. Wang needle biopsies from 4L node 3. Wang needle biopsies from 11L node 4. Transbronchial brushings from LUL 5. Transbronchial biopsies from LUL  Plans:  The patient will be discharged from the PACU to home when recovered from anesthesia. We will review the cytology, pathology results with the patient when they become available. Outpatient followup will be with Dr Joya Gaskins or Dr Lamonte Sakai.   Baltazar Apo, MD, PhD 09/05/2014, 12:45 PM  Pulmonary and Critical Care [phone] or if no answer [phone]

## 2014-09-05 NOTE — Progress Notes (Signed)
Spoke via phone with Dr Lamonte Sakai re: sats  fo 90-92 on 3lpm n/c.Marland Kitchen  And IS volumes of >159ms....Marland Kitchenk to D/C home with sats >/= 90 on 3lnc, pt also to initiate home O2 use of 3lnc upon arrival home vs waiting until bedtime.

## 2014-09-05 NOTE — Progress Notes (Signed)
Difficulty weaning patient off O2 despite him being A/O fully, and pulls easily >178ms on incentive spirometery. With further questioning, pt staes he use home O2 3lpm n/c at bedtime. This had not been revealed in  [facility]- PACU report not disclosed in H&P.

## 2014-09-05 NOTE — Anesthesia Preprocedure Evaluation (Addendum)
Anesthesia Evaluation  Patient identified by MRN, date of birth, ID band Patient awake    Reviewed: Allergy & Precautions, NPO status , Patient's Chart, lab work & pertinent test results  Airway Mallampati: II  TM Distance: >3 FB     Dental  (+) Teeth Intact, Dental Advisory Given   Pulmonary shortness of breath and with exertion, former smoker,  breath sounds clear to auscultation        Cardiovascular hypertension, + CAD and + Peripheral Vascular Disease + pacemaker Rhythm:Regular Rate:Normal     Neuro/Psych PSYCHIATRIC DISORDERS    GI/Hepatic GERD-  Medicated and Controlled,  Endo/Other    Renal/GU      Musculoskeletal   Abdominal   Peds  Hematology   Anesthesia Other Findings   Reproductive/Obstetrics                            Anesthesia Physical Anesthesia Plan  ASA: III  Anesthesia Plan: General   Post-op Pain Management:    Induction: Intravenous  Airway Management Planned: Oral ETT  Additional Equipment: None  Intra-op Plan:   Post-operative Plan:   Informed Consent: I have reviewed the patients History and Physical, chart, labs and discussed the procedure including the risks, benefits and alternatives for the proposed anesthesia with the patient or authorized representative who has indicated his/her understanding and acceptance.   Dental advisory given and Dental Advisory Given  Plan Discussed with: CRNA, Anesthesiologist and Surgeon  Anesthesia Plan Comments:        Anesthesia Quick Evaluation

## 2014-09-05 NOTE — Discharge Instructions (Signed)
Flexible Bronchoscopy, Care After These instructions give you information on caring for yourself after your procedure. Your doctor may also give you more specific instructions. Call your doctor if you have any problems or questions after your procedure. HOME CARE  Do not eat or drink anything for 2 hours after your procedure. If you try to eat or drink before the medicine wears off, food or drink could go into your lungs. You could also burn yourself.  After 2 hours have passed and when you can cough and gag normally, you may eat soft food and drink liquids slowly.  The day after the test, you may eat your normal diet.  You may do your normal activities.  Keep all doctor visits. GET HELP RIGHT AWAY IF:  You get more and more short of breath.  You get light-headed.  You feel like you are going to pass out (faint).  You have chest pain.  You have new problems that worry you.  You cough up more than a little blood.  You cough up more blood than before. MAKE SURE YOU:  Understand these instructions.  Will watch your condition.  Will get help right away if you are not doing well or get worse. Document Released: 03/08/2009 Document Revised: 05/16/2013 Document Reviewed: 01/13/2013 [facility] Patient Information 2015 [place], [place]. This information is not intended to replace advice given to you by your health care provider. Make sure you discuss any questions you have with your health care provider.  Please call our office for any problems or questions. [phone].

## 2014-09-05 NOTE — H&P (Signed)
HPI:  74 yo man, followed by Dr Joya Gaskins for ILD, nocturnal hypoxemia. He underwent Ct chest 08/27/14 that showed a LUL nodule and some hilar and mediastinal LAD.  Subsequent PET showed that the nodule is hypermetabolic. There is also some less prominent hypermetabolism in the L perihilar region.    Past Medical History  Diagnosis Date  . GI bleed   . Shoulder joint pain   . Coronary artery disease s/p CABG 1990s     a. approx 1994 s/p CABG x 4 (LIMA->LAD, VG->RCA, VG->RI, VG->Diag);  b. 11/2008 Cath: LM 100, LAD small, RCA 50-60p, 100d, VG->RCA 100 ost, VG->RI 22m VG->Diag 40 - upper branch of diag small, 99%, LIMA-LAD patent.;  c. 08/2011 Echo: EF 45-50%, mod dil LA,/RA/RV with mild-mod reduced RV fxn.  . Back pain   . Hip pain, right   . PSA (psoriatic arthritis)     increased  . Lumbar disc disease   . Malignant neoplasm of prostate   . Fatigue   . Pulmonary fibrosis, postinflammatory     Stopped Cellcept 02/13/13 d/t cost   . Osteoarthritis   . HTN (hypertension)   . Depression   . Anxiety   . ED (erectile dysfunction)   . Hyperlipidemia   . GERD (gastroesophageal reflux disease)   . Allergic rhinitis   . Peptic stricture of esophagus   . Complete heart block     a. s/p SJM Accent dc ppm, ser # [id number] . Chronic bronchitis 02/07/2012  . PVD (peripheral vascular disease)     a. 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  bilat ext iliac dzs.  . Pneumonia 1993    post-op  . Allergic rhinitis, cause unspecified 02/28/2013  . Shortness of breath dyspnea   . Bipolar disorder   . History of kidney stones      Family History  Problem Relation Age of Onset  . Heart attack Father   . Heart disease Father   . Colon cancer Neg Hx   . Cancer Neg Hx     Colon     History   Social History  . Marital Status: Married    Spouse Name: N/A  . Number of Children: 2  . Years of Education: N/A   Occupational History  . Retired    Social History  Main Topics  . Smoking status: Former Smoker -- 1.50 packs/day for 42 years    Types: Cigarettes    Quit date: 09/11/2001  . Smokeless tobacco: Never Used     Comment: Pt does not get regular exercise  . Alcohol Use: No  . Drug Use: No  . Sexual Activity: Yes   Other Topics Concern  . 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     No Known Allergies   Filed Vitals:   09/05/14 0819 09/05/14 0821 09/05/14 0835  BP:  89/62   Pulse:  92   Temp:   97.8 F (36.6 C)  TempSrc:   Oral  Resp:  18   Height: 6' (1.829 m)    Weight: 105.235 kg (232 lb)    SpO2:  95%    Gen: Pleasant, well-nourished, in no distress,  normal affect  ENT: No lesions,  mouth clear,  oropharynx clear, no postnasal drip  Neck: No JVD, no TMG, no carotid bruits  Lungs:  No use of accessory muscles, no dullness to percussion, clear without rales or rhonchi  Cardiovascular: RRR, heart sounds normal, no murmur or gallops, no peripheral edema  Musculoskeletal: No deformities, no cyanosis or clubbing  Neuro: alert, non focal  Skin: Warm, no lesions or rashes   CT chest 08/27/14 --  COMPARISON: Chest x-ray 02/10/2012.  FINDINGS: Mediastinum/Lymph Nodes: Heart size is normal. There is no significant pericardial fluid, thickening or pericardial calcification. There is atherosclerosis of the thoracic aorta, the great vessels of the mediastinum and the coronary arteries, including calcified atherosclerotic plaque in the left main, left anterior descending, left circumflex and right coronary arteries. Status post median sternotomy for CABG, including LIMA to the LAD. Left-sided pacemaker device in position with lead tips terminating in the right atrial appendage and right ventricular apex. No pathologically enlarged mediastinal or hilar lymph nodes. Please note that accurate exclusion of  hilar adenopathy is limited on noncontrast CT scans. Small hiatal hernia. No axillary lymphadenopathy.  Lungs/Pleura: New macro lobulated left upper lobe mass measuring 3.2 x 2.0 x 2.0 cm (image 23 of series 3), highly concerning for bronchogenic neoplasm. Bilateral apical nodular pleuroparenchymal thickening, favored to be areas of chronic post infectious or inflammatory scarring. No other definite suspicious appearing pulmonary nodules or masses are noted. Today's study was not performed as a high-resolution chest CT. There is moderate to severe centrilobular and paraseptal emphysema. Some areas of subpleural reticulation are noted, including some areas of potential honeycombing. Diffuse bronchial wall thickening. Scattered areas of very mild cylindrical bronchiectasis. No acute consolidative airspace disease. No pleural effusions.  Upper Abdomen: Atherosclerosis. Otherwise, unremarkable.  Musculoskeletal/Soft Tissues: Median sternotomy wires. There are no aggressive appearing lytic or blastic lesions noted in the visualized portions of the skeleton.  IMPRESSION: 1. New macrolobulated left upper lobe mass highly concerning for primary bronchogenic neoplasm. Correlation with PET-CT and/or biopsy is recommended for diagnostic and staging purposes at this time. No definite mediastinal or hilar lymphadenopathy noted on today's noncontrast CT examination. 2. The appearance of the lungs is unusual, and appears to reflect a combination of moderate to severe centrilobular and paraseptal emphysema, as well as some associated interstitial lung disease. The pattern of interstitial lung findings there is a indeterminate, the but suspicious for potential usual interstitial pneumonia (UIP)  given the slight progression compared to prior examinations and the presence of areas of apparent honeycombing. 3. Atherosclerosis, including left main and 3 vessel coronary artery disease.  Assessment for potential risk factor modification, dietary therapy or pharmacologic therapy may be warranted, if clinically indicated. These results will be called to the ordering clinician or representative by the Radiologist Assistant, and communication documented in the PACS or zVision Dashboard.  Plans:  Will perform TBBx and EBUS to sample any available mediastinal nodes.   Baltazar Apo, MD, PhD 09/05/2014, 10:49 AM [facility] [phone] or if no answer [phone]

## 2014-09-05 NOTE — Anesthesia Postprocedure Evaluation (Signed)
  Anesthesia Post-op Note  Patient: Wayne Torres  Procedure(s) Performed: Procedure(s): VIDEO BRONCHOSCOPY WITH ENDOBRONCHIAL ULTRASOUND (Left)  Patient Location: PACU  Anesthesia Type:General  Level of Consciousness: awake, alert  and oriented  Airway and Oxygen Therapy: Patient Spontanous Breathing and Patient connected to nasal cannula oxygen  Post-op Pain: mild  Post-op Assessment: Post-op Vital signs reviewed, Patient's Cardiovascular Status Stable, Respiratory Function Stable, Patent Airway, No signs of Nausea or vomiting and Pain level controlled  Post-op Vital Signs: stable  Last Vitals:  Filed Vitals:   09/05/14 1431  BP: 95/53  Pulse: 67  Temp: 36.2 C  Resp: 18    Complications: No apparent anesthesia complications

## 2014-09-06 ENCOUNTER — Telehealth: Payer: Self-pay | Admitting: Critical Care Medicine

## 2014-09-06 ENCOUNTER — Encounter (HOSPITAL_COMMUNITY): Payer: Self-pay | Admitting: Emergency Medicine

## 2014-09-06 DIAGNOSIS — J84112 Idiopathic pulmonary fibrosis: Secondary | ICD-10-CM

## 2014-09-06 NOTE — Telephone Encounter (Signed)
Pt aware and order placed. Nothing further needed

## 2014-09-06 NOTE — Telephone Encounter (Signed)
Called spoke with pt. He is wanting to know if we can send in order to get him a POC. He reports he no longer can walk w/o O2 now or he will drop into the 80's and would like a more portable system. Please advise PW thanks

## 2014-09-06 NOTE — Telephone Encounter (Signed)
Let the pt know the saturations were normal on 3Liters Stay on same dose for now

## 2014-09-06 NOTE — Telephone Encounter (Signed)
Ok to try POC that goes to 4L pulse

## 2014-09-07 ENCOUNTER — Telehealth: Payer: Self-pay | Admitting: Emergency Medicine

## 2014-09-07 NOTE — Telephone Encounter (Signed)
Called and left a message > all nodal EBUS samples negative; L lung biopsies not yet final, single result identifies a small fragment of atypical epithelium.  Notified him that either Dr Joya Gaskins or I would call him next week when all the path is final

## 2014-09-07 NOTE — Telephone Encounter (Signed)
lmomtcb for pt  Note: Results placed in scan folder.

## 2014-09-07 NOTE — Telephone Encounter (Signed)
Pt wife returned call  [phone]

## 2014-09-07 NOTE — Telephone Encounter (Signed)
Pt's wife is aware of results. Nothing further was needed.

## 2014-09-11 ENCOUNTER — Telehealth: Payer: Self-pay | Admitting: Critical Care Medicine

## 2014-09-11 NOTE — Telephone Encounter (Signed)
Please let him know that the pathology appears to be Negative for malignancy. I would like for him to follow with Dr Joya Gaskins to discuss whether we should take an alternative approach for biopsy. This may be possible with either a transthoracic needle biopsy or a repeat bronchoscopy.

## 2014-09-11 NOTE — Telephone Encounter (Signed)
Pt called back regarding results  I notified him of the below recs  Have the final results come in yet? Please advise, thanks

## 2014-09-11 NOTE — Telephone Encounter (Signed)
Spoke with the pt and notified of recs per RB  He verbalized understanding  Will keep appt with PW for 09/19/14

## 2014-09-11 NOTE — Telephone Encounter (Signed)
Duplicate msg.

## 2014-09-19 ENCOUNTER — Other Ambulatory Visit (INDEPENDENT_AMBULATORY_CARE_PROVIDER_SITE_OTHER): Payer: Medicare Other

## 2014-09-19 ENCOUNTER — Ambulatory Visit (INDEPENDENT_AMBULATORY_CARE_PROVIDER_SITE_OTHER): Payer: Medicare Other | Admitting: Critical Care Medicine

## 2014-09-19 ENCOUNTER — Encounter: Payer: Self-pay | Admitting: Critical Care Medicine

## 2014-09-19 VITALS — BP 104/72 | HR 61 | Temp 98.4°F | Ht 72.0 in | Wt 229.8 lb

## 2014-09-19 DIAGNOSIS — Z01818 Encounter for other preprocedural examination: Secondary | ICD-10-CM | POA: Diagnosis not present

## 2014-09-19 DIAGNOSIS — R0600 Dyspnea, unspecified: Secondary | ICD-10-CM | POA: Diagnosis not present

## 2014-09-19 DIAGNOSIS — R918 Other nonspecific abnormal finding of lung field: Secondary | ICD-10-CM | POA: Diagnosis not present

## 2014-09-19 DIAGNOSIS — J84112 Idiopathic pulmonary fibrosis: Secondary | ICD-10-CM | POA: Diagnosis not present

## 2014-09-19 DIAGNOSIS — Z9189 Other specified personal risk factors, not elsewhere classified: Secondary | ICD-10-CM

## 2014-09-19 DIAGNOSIS — Z7901 Long term (current) use of anticoagulants: Secondary | ICD-10-CM

## 2014-09-19 LAB — CBC
HEMATOCRIT: 41.1 % (ref 39.0–52.0)
HEMOGLOBIN: 13.8 g/dL (ref 13.0–17.0)
MCHC: 33.5 g/dL (ref 30.0–36.0)
MCV: 87.6 fl (ref 78.0–100.0)
Platelets: 332 10*3/uL (ref 150.0–400.0)
RBC: 4.69 Mil/uL (ref 4.22–5.81)
RDW: 14.4 % (ref 11.5–15.5)
WBC: 9.6 10*3/uL (ref 4.0–10.5)

## 2014-09-19 LAB — PROTIME-INR
INR: 1.1 ratio — ABNORMAL HIGH (ref 0.8–1.0)
PROTHROMBIN TIME: 12.6 s (ref 9.6–13.1)

## 2014-09-19 MED ORDER — ALBUTEROL SULFATE HFA 108 (90 BASE) MCG/ACT IN AERS: 2.0000 | INHALATION_SPRAY | Freq: Four times a day (QID) | RESPIRATORY_TRACT | Status: DC | PRN

## 2014-09-19 NOTE — Patient Instructions (Signed)
We will order a lung biopsy from radiology today Labs today : PT PTT CBC No other changes Refills on albuterol sent, no samples available We will call biopsy results

## 2014-09-19 NOTE — Progress Notes (Signed)
Subjective:    Patient ID: Wayne Torres, male    DOB: 16-Feb-1941, 74 y.o.   MRN: [id number]  HPI  09/19/2014 Chief Complaint  Patient presents with  . 6 wk follow up    Cough present since bronch.  Now with clear mucus.  SOB unchanged.  No chest tightness/CP.  Overall the patient's symptom complex is unchanged. The patient has cough productive of clear mucus. Dyspnea is unchanged. The patient remains on oxygen and has been benefited with oxygen therapy. Pt denies any significant sore throat, nasal congestion or excess secretions, fever, chills, sweats, unintended weight loss, pleurtic or exertional chest pain, orthopnea PND, or leg swelling Pt denies any increase in rescue therapy over baseline, denies waking up needing it or having any early am or nocturnal exacerbations of coughing/wheezing/or dyspnea. Pt also denies any obvious fluctuation in symptoms with  weather or environmental change or other alleviating or aggravating factors  The pt had an fob for LUL lung mass , bx was non diag.  LN samples neg for CA Pt here with spouse to discuss next steps   Review of Systems Constitutional:   No  weight loss, night sweats,  Fevers, chills, fatigue, lassitude. HEENT:   No headaches,  Difficulty swallowing,  Tooth/dental problems,  Sore throat,                No sneezing, itching, ear ache, nasal congestion, post nasal drip,   CV:  No chest pain,  Orthopnea, PND, swelling in lower extremities, anasarca, dizziness, palpitations  GI  No heartburn, indigestion, abdominal pain, nausea, vomiting, diarrhea, change in bowel habits, loss of appetite  Resp: Notes shortness of breath with exertion or at rest.  No excess mucus, no productive cough,  Notes non-productive cough,  No coughing up of blood.  No change in color of mucus.  No wheezing.  No chest wall deformity  Skin: no rash or lesions.  GU: no dysuria, change in color of urine, no urgency or frequency.  No flank pain.  MS:  No joint  pain or swelling.  No decreased range of motion.  No back pain.  Psych:  No change in mood or affect. No depression or anxiety.  No memory loss.     Objective:   Physical Exam Filed Vitals:   09/19/14 1128  BP: 104/72  Pulse: 61  Temp: 98.4 F (36.9 C)  TempSrc: Oral  Height: 6' (1.829 m)  Weight: 229 lb 12.8 oz (104.237 kg)  SpO2: 92%    Gen: Pleasant,  in no distress,  normal affect  ENT: No lesions,  mouth clear,  oropharynx clear, no postnasal drip  Neck: No JVD, no TMG, no carotid bruits  Lungs: No use of accessory muscles, no dullness to percussion, dry rales bibasilar   Cardiovascular: RRR, heart sounds normal, no murmur or gallops, no peripheral edema  Abdomen: soft and NT, no HSM,  BS normal  Musculoskeletal: No deformities, no cyanosis or clubbing  Neuro: alert, non focal  Skin: Warm, no lesions or rashes  No results found.     Assessment & Plan:   Lung mass Lung mass LUL new from 2014 CT chest.  Suspect CA based on PET scan,  Non diagnostic EBUS per [facility] with neg LN samples Plan Transthoracic needle bx per IR Then refer to XRT onc for primary Rx The pt is NOT a surgical candidate I spent 10mn going over Rx plan with the pt and spouse and answered all ?s  IPF (idiopathic pulmonary fibrosis) Stable hypoxic resp failure chronic d/t IPF Plan Cont oxygen rx Hold on esbriet pending results from lung eval and Rx     Updated Medication List Outpatient Encounter Prescriptions as of 09/19/2014  Medication Sig  . albuterol (PROVENTIL HFA;VENTOLIN HFA) 108 (90 BASE) MCG/ACT inhaler Inhale 2 puffs into the lungs every 6 (six) hours as needed for wheezing or shortness of breath.  . ALPRAZolam (XANAX) 0.25 MG tablet TAKE 1 TABLET BY MOUTH DAILY AS NEEDED  . amLODipine (NORVASC) 5 MG tablet Take 0.5 tablets (2.5 mg total) by mouth daily.  . calcium citrate (CALCITRATE - DOSED IN MG ELEMENTAL CALCIUM) 950 MG tablet Take 200 mg of elemental calcium by  mouth daily. With Vitamin D #3 take 1 tab daily  . citalopram (CELEXA) 20 MG tablet TAKE 1 TABLET BY MOUTH EVERY DAY  . finasteride (PROSCAR) 5 MG tablet Take 5 mg by mouth daily.   . Garlic 2774 MG CAPS Take by mouth. 1 tab daily  . lisinopril (PRINIVIL,ZESTRIL) 10 MG tablet Take 0.5 tablets (5 mg total) by mouth daily.  . Magnesium 250 MG TABS Take 500 mg by mouth. Take 1 tablet daily  . metoprolol succinate (TOPROL-XL) 25 MG 24 hr tablet TAKE 1/2 TABLET TWICE A DAY  . Multiple Vitamins-Minerals (EYE VITAMINS) CAPS Take by mouth 2 (two) times daily.   . nitroGLYCERIN (NITROSTAT) 0.4 MG SL tablet Place 1 tablet (0.4 mg total) under the tongue every 5 (five) minutes as needed for chest pain.  . Omega-3 Fatty Acids (FISH OIL) 1200 MG CAPS Take 1 capsule by mouth daily.  Marland Kitchen omeprazole (PRILOSEC) 40 MG capsule TAKE ONE CAPSULE BY MOUTH EVERY DAY  . simvastatin (ZOCOR) 40 MG tablet Take 1 tablet (40 mg total) by mouth at bedtime.  . tamsulosin (FLOMAX) 0.4 MG CAPS capsule Take 0.4 mg by mouth daily.  . vitamin E 400 UNIT capsule Take 400 Units by mouth daily. Take 1 tab daily  . [DISCONTINUED] albuterol (PROVENTIL HFA;VENTOLIN HFA) 108 (90 BASE) MCG/ACT inhaler Inhale 2 puffs into the lungs every 6 (six) hours as needed for wheezing or shortness of breath.

## 2014-09-20 ENCOUNTER — Other Ambulatory Visit: Payer: Self-pay | Admitting: Radiology

## 2014-09-20 NOTE — Assessment & Plan Note (Signed)
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

## 2014-09-20 NOTE — Progress Notes (Signed)
Quick Note:  Called, spoke with pt's wife. Discussed lab results per Dr. Joya Gaskins. She verbalized understanding and voiced no further questions or concerns at this time. ______

## 2014-09-20 NOTE — Progress Notes (Signed)
Quick Note:  Spoke with Heather in IR. They are able to see results. Nothing further needed. ______

## 2014-09-20 NOTE — Assessment & Plan Note (Signed)
Lung mass LUL new from 2014 CT chest.  Suspect CA based on PET scan,  Non diagnostic EBUS per [facility] with neg LN samples Plan Transthoracic needle bx per IR Then refer to XRT onc for primary Rx The pt is NOT a surgical candidate I spent 50mn going over Rx plan with the pt and spouse and answered all ?s

## 2014-09-24 ENCOUNTER — Ambulatory Visit (HOSPITAL_COMMUNITY)
Admission: RE | Admit: 2014-09-24 | Discharge: 2014-09-24 | Disposition: A | Discharge status: Home / Self Care | Payer: Medicare Other | Source: Ambulatory Visit | Attending: Critical Care Medicine | Admitting: Critical Care Medicine

## 2014-09-24 ENCOUNTER — Ambulatory Visit (HOSPITAL_COMMUNITY)
Admission: RE | Admit: 2014-09-24 | Discharge: 2014-09-24 | Disposition: A | Discharge status: Home / Self Care | Payer: Medicare Other | Source: Ambulatory Visit | Attending: Internal Medicine | Admitting: Internal Medicine

## 2014-09-24 ENCOUNTER — Encounter (HOSPITAL_COMMUNITY): Payer: Self-pay

## 2014-09-24 ENCOUNTER — Ambulatory Visit (HOSPITAL_COMMUNITY)
Admission: RE | Admit: 2014-09-24 | Discharge: 2014-09-24 | Disposition: A | Discharge status: Home / Self Care | Payer: Medicare Other | Source: Ambulatory Visit | Attending: Interventional Radiology | Admitting: Interventional Radiology

## 2014-09-24 DIAGNOSIS — I10 Essential (primary) hypertension: Secondary | ICD-10-CM | POA: Insufficient documentation

## 2014-09-24 DIAGNOSIS — J84112 Idiopathic pulmonary fibrosis: Secondary | ICD-10-CM | POA: Insufficient documentation

## 2014-09-24 DIAGNOSIS — Z79899 Other long term (current) drug therapy: Secondary | ICD-10-CM | POA: Diagnosis not present

## 2014-09-24 DIAGNOSIS — Z87442 Personal history of urinary calculi: Secondary | ICD-10-CM | POA: Diagnosis not present

## 2014-09-24 DIAGNOSIS — R911 Solitary pulmonary nodule: Secondary | ICD-10-CM

## 2014-09-24 DIAGNOSIS — F319 Bipolar disorder, unspecified: Secondary | ICD-10-CM | POA: Diagnosis not present

## 2014-09-24 DIAGNOSIS — I2510 Atherosclerotic heart disease of native coronary artery without angina pectoris: Secondary | ICD-10-CM | POA: Insufficient documentation

## 2014-09-24 DIAGNOSIS — K219 Gastro-esophageal reflux disease without esophagitis: Secondary | ICD-10-CM | POA: Diagnosis not present

## 2014-09-24 DIAGNOSIS — F419 Anxiety disorder, unspecified: Secondary | ICD-10-CM | POA: Insufficient documentation

## 2014-09-24 DIAGNOSIS — C3492 Malignant neoplasm of unspecified part of left bronchus or lung: Secondary | ICD-10-CM | POA: Insufficient documentation

## 2014-09-24 DIAGNOSIS — Z950 Presence of cardiac pacemaker: Secondary | ICD-10-CM | POA: Insufficient documentation

## 2014-09-24 DIAGNOSIS — E785 Hyperlipidemia, unspecified: Secondary | ICD-10-CM | POA: Insufficient documentation

## 2014-09-24 DIAGNOSIS — N529 Male erectile dysfunction, unspecified: Secondary | ICD-10-CM | POA: Diagnosis not present

## 2014-09-24 DIAGNOSIS — Z8546 Personal history of malignant neoplasm of prostate: Secondary | ICD-10-CM | POA: Diagnosis not present

## 2014-09-24 DIAGNOSIS — I739 Peripheral vascular disease, unspecified: Secondary | ICD-10-CM | POA: Diagnosis not present

## 2014-09-24 DIAGNOSIS — F329 Major depressive disorder, single episode, unspecified: Secondary | ICD-10-CM | POA: Insufficient documentation

## 2014-09-24 DIAGNOSIS — Z87891 Personal history of nicotine dependence: Secondary | ICD-10-CM | POA: Insufficient documentation

## 2014-09-24 DIAGNOSIS — R918 Other nonspecific abnormal finding of lung field: Secondary | ICD-10-CM

## 2014-09-24 LAB — CBC WITH DIFFERENTIAL/PLATELET
BASOS PCT: 0 % (ref 0–1)
Basophils Absolute: 0 10*3/uL (ref 0.0–0.1)
Eosinophils Absolute: 0.3 10*3/uL (ref 0.0–0.7)
Eosinophils Relative: 3 % (ref 0–5)
HCT: 39.5 % (ref 39.0–52.0)
Hemoglobin: 12.9 g/dL — ABNORMAL LOW (ref 13.0–17.0)
LYMPHS ABS: 1.5 10*3/uL (ref 0.7–4.0)
Lymphocytes Relative: 16 % (ref 12–46)
MCH: 29.5 pg (ref 26.0–34.0)
MCHC: 32.7 g/dL (ref 30.0–36.0)
MCV: 90.2 fL (ref 78.0–100.0)
MONO ABS: 1.1 10*3/uL — AB (ref 0.1–1.0)
Monocytes Relative: 12 % (ref 3–12)
Neutro Abs: 6.4 10*3/uL (ref 1.7–7.7)
Neutrophils Relative %: 69 % (ref 43–77)
PLATELETS: 305 10*3/uL (ref 150–400)
RBC: 4.38 MIL/uL (ref 4.22–5.81)
RDW: 13.7 % (ref 11.5–15.5)
WBC: 9.2 10*3/uL (ref 4.0–10.5)

## 2014-09-24 LAB — BASIC METABOLIC PANEL
Anion gap: 9 (ref 5–15)
BUN: 20 mg/dL (ref 6–20)
CO2: 25 mmol/L (ref 22–32)
Calcium: 8.9 mg/dL (ref 8.9–10.3)
Chloride: 104 mmol/L (ref 101–111)
Creatinine, Ser: 1.14 mg/dL (ref 0.61–1.24)
GFR calc Af Amer: >60 mL/min (ref >60)
GFR calc non Af Amer: >60 mL/min (ref >60)
GLUCOSE: 117 mg/dL — AB (ref 70–99)
Potassium: 4 mmol/L (ref 3.5–5.1)
SODIUM: 138 mmol/L (ref 135–145)

## 2014-09-24 LAB — PROTIME-INR
INR: 1.08 (ref 0.00–1.49)
Prothrombin Time: 14.1 seconds (ref 11.6–15.2)

## 2014-09-24 LAB — APTT: APTT: 30 s (ref 24–37)

## 2014-09-24 MED ORDER — FENTANYL CITRATE (PF) 100 MCG/2ML IJ SOLN
INTRAMUSCULAR | Status: AC | PRN
  Administered 2014-09-24: 25 ug via INTRAVENOUS

## 2014-09-24 MED ORDER — FENTANYL CITRATE (PF) 100 MCG/2ML IJ SOLN
INTRAMUSCULAR | Status: AC
  Filled 2014-09-24: qty 4

## 2014-09-24 MED ORDER — SODIUM CHLORIDE 0.9 % IV SOLN
INTRAVENOUS | Status: DC
  Administered 2014-09-24: 08:00:00 via INTRAVENOUS

## 2014-09-24 MED ORDER — MIDAZOLAM HCL 2 MG/2ML IJ SOLN
INTRAMUSCULAR | Status: AC | PRN
  Administered 2014-09-24: 1 mg via INTRAVENOUS

## 2014-09-24 MED ORDER — MIDAZOLAM HCL 2 MG/2ML IJ SOLN
INTRAMUSCULAR | Status: AC
  Filled 2014-09-24: qty 6

## 2014-09-24 MED ORDER — HYDROCODONE-ACETAMINOPHEN 5-325 MG PO TABS
1.0000 | ORAL_TABLET | ORAL | Status: DC | PRN
  Filled 2014-09-24: qty 2

## 2014-09-24 NOTE — Procedures (Signed)
CT core biopsy LUL nodule 02M x4 No complication, no ptx. No blood loss. See complete dictation in [facility].

## 2014-09-24 NOTE — H&P (Signed)
Chief Complaint: "I'm having a lung biopsy"  Referring Physician(s): Wright,Patrick E  History of Present Illness: Wayne Torres is a 74 y.o. male , former smoker, with remote history of prostate cancer, pulmonary fibrosis, coronary artery disease with pacemaker and recent imaging revealing a hypermetabolic left upper lobe lung mass. Patient underwent nondiagnostic bronchoscopy on 09/05/14 which revealed only a few atypical cells. He presents today for CT-guided left upper lobe lung mass biopsy.  Past Medical History  Diagnosis Date  . GI bleed   . Shoulder joint pain   . Coronary artery disease s/p CABG 1990s     a. approx 1994 s/p CABG x 4 (LIMA->LAD, VG->RCA, VG->RI, VG->Diag);  b. 11/2008 Cath: LM 100, LAD small, RCA 50-60p, 100d, VG->RCA 100 ost, VG->RI 26m VG->Diag 40 - upper branch of diag small, 99%, LIMA-LAD patent.;  c. 08/2011 Echo: EF 45-50%, mod dil LA,/RA/RV with mild-mod reduced RV fxn.  . Back pain   . Hip pain, right   . PSA (psoriatic arthritis)     increased  . Lumbar disc disease   . Malignant neoplasm of prostate   . Fatigue   . Pulmonary fibrosis, postinflammatory     Stopped Cellcept 02/13/13 d/t cost   . Osteoarthritis   . HTN (hypertension)   . Depression   . Anxiety   . ED (erectile dysfunction)   . Hyperlipidemia   . GERD (gastroesophageal reflux disease)   . Allergic rhinitis   . Peptic stricture of esophagus   . Complete heart block     a. s/p SJM Accent dc ppm, ser # [id number] . Chronic bronchitis 02/07/2012  . PVD (peripheral vascular disease)     a. 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  bilat ext iliac dzs.  . Pneumonia 1993    post-op  . Allergic rhinitis, cause unspecified 02/28/2013  . Shortness of breath dyspnea   . Bipolar disorder   . History of kidney stones     Past Surgical History  Procedure Laterality Date  . Coronary angioplasty  06/03/2007, 01/25/2009  . Nasal septum surgery  april 2012      dr britt, [facility] [place]  . Coronary artery bypass graft  20 yrs ago    LIMA to LAD, SVG to PDA, SVG to Ramus, SVG to D  . Pacemaker placement  08/2011  . Permanent pacemaker insertion N/A 09/14/2011    Procedure: PERMANENT PACEMAKER INSERTION;  Surgeon: SDeboraha Sprang MD;  [facility];  Service: Cardiovascular;  Laterality: N/A;  . Left and right heart catheterization with coronary angiogram N/A 07/05/2012    Procedure: LEFT AND RIGHT HEART CATHETERIZATION WITH CORONARY ANGIOGRAM;  Surgeon: Peter M JMartinique MD;  [facility];  Service: Cardiovascular;  Laterality: N/A;  . Cystoscopy    . Video bronchoscopy with endobronchial ultrasound Left 09/05/2014    Procedure: VIDEO BRONCHOSCOPY WITH ENDOBRONCHIAL ULTRASOUND;  Surgeon: RCollene Gobble MD;  [facility]  Service: Thoracic;  Laterality: Left;    Allergies: Review of patient's allergies indicates no known allergies.  Medications: Prior to Admission medications   Medication Sig Start Date End Date Taking? Authorizing Provider  albuterol (PROVENTIL HFA;VENTOLIN HFA) 108 (90 BASE) MCG/ACT inhaler Inhale 2 puffs into the lungs every 6 (six) hours as needed for wheezing or shortness of breath. 09/19/14  Yes PElsie Stain MD  ALPRAZolam (XANAX) 0.25 MG tablet TAKE 1 TABLET BY MOUTH DAILY AS NEEDED  Patient taking differently: TAKE 1 TABLET BY MOUTH DAILY AS NEEDED FOR ANXIETY. 07/27/14  Yes Biagio Borg, MD  amLODipine (NORVASC) 5 MG tablet Take 0.5 tablets (2.5 mg total) by mouth daily. 06/19/14  Yes Biagio Borg, MD  citalopram (CELEXA) 20 MG tablet TAKE 1 TABLET BY MOUTH EVERY DAY 07/04/14  Yes Biagio Borg, MD  finasteride (PROSCAR) 5 MG tablet Take 5 mg by mouth daily.  03/10/13  Yes Historical Provider, MD  lisinopril (PRINIVIL,ZESTRIL) 10 MG tablet Take 0.5 tablets (5 mg total) by mouth daily. 05/01/14  Yes Biagio Borg, MD  Magnesium 500 MG TABS Take 1 tablet by mouth every morning.   Yes Historical Provider, MD   metoprolol succinate (TOPROL-XL) 25 MG 24 hr tablet TAKE 1/2 TABLET TWICE A DAY 04/11/14  Yes [name], MD  omeprazole (PRILOSEC) 40 MG capsule TAKE ONE CAPSULE BY MOUTH EVERY DAY 06/25/14  Yes Biagio Borg, MD  simvastatin (ZOCOR) 40 MG tablet Take 1 tablet (40 mg total) by mouth at bedtime. 05/01/14  Yes Biagio Borg, MD  tamsulosin (FLOMAX) 0.4 MG CAPS capsule Take 0.4 mg by mouth every morning.  08/31/14  Yes Historical Provider, MD  acetaminophen (TYLENOL) 500 MG tablet Take 1,000 mg by mouth every 6 (six) hours as needed for moderate pain or headache.    Historical Provider, MD  Multiple Vitamins-Minerals (PRESERVISION AREDS PO) Take 1 tablet by mouth 2 (two) times daily.    Historical Provider, MD  nitroGLYCERIN (NITROSTAT) 0.4 MG SL tablet Place 1 tablet (0.4 mg total) under the tongue every 5 (five) minutes as needed for chest pain. 08/07/14   [name], MD  Omega-3 Fatty Acids (FISH OIL) 1200 MG CAPS Take 1 capsule by mouth daily.    Historical Provider, MD    Family History  Problem Relation Age of Onset  . Heart attack Father   . Heart disease Father   . Colon cancer Neg Hx   . Cancer Neg Hx     Colon    History   Social History  . Marital Status: Married    Spouse Name: N/A  . Number of Children: 2  . Years of Education: N/A   Occupational History  . Retired    Social History Main Topics  . Smoking status: Former Smoker -- 1.50 packs/day for 42 years    Types: Cigarettes    Quit date: 09/11/2001  . Smokeless tobacco: Never Used     Comment: Pt does not get regular exercise  . Alcohol Use: No  . Drug Use: No  . Sexual Activity: Yes   Other Topics Concern  . 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   +Hx of smoking, quit about 2003.      Review of Systems  Constitutional: Negative for fever and chills.  HENT: Positive for hearing loss.   Respiratory: Negative for cough.         Some dyspnea with exertion  Cardiovascular: Negative for chest pain.  Gastrointestinal: Negative for nausea, vomiting and abdominal pain.  Genitourinary: Negative for dysuria and hematuria.  Musculoskeletal: Negative for back pain.  Neurological: Negative for headaches.    Vital Signs: BP 93/55 mmHg  Pulse 64  Temp(Src) 97.7 F (36.5 C) (Oral)  Resp 20  Ht 6' (1.829 m)  Wt 231 lb (104.781 kg)  BMI 31.32 kg/m2  SpO2 92%  Physical Exam  Constitutional: He is oriented to person, place, and time. He appears well-developed and well-nourished.  Cardiovascular: Normal rate and regular rhythm.   Left upper chest wall pacemaker  Pulmonary/Chest: Effort normal.  Distant breath sounds bilaterally, few fine basilar crackles  Abdominal: Soft. Bowel sounds are normal. There is no tenderness.  Musculoskeletal: Normal range of motion. He exhibits no edema.  Neurological: He is alert and oriented to person, place, and time.    Imaging: Ct Chest Wo Contrast  08/27/2014   CLINICAL DATA:  74 year old male with history of idiopathic pulmonary fibrosis for several years. Former smoker. Currently asymptomatic.  EXAM: CT CHEST WITHOUT CONTRAST  TECHNIQUE: Multidetector CT imaging of the chest was performed following the standard protocol without IV contrast.  COMPARISON:  Chest x-ray 02/10/2012.  FINDINGS: Mediastinum/Lymph Nodes: Heart size is normal. There is no significant pericardial fluid, thickening or pericardial calcification. There is atherosclerosis of the thoracic aorta, the great vessels of the mediastinum and the coronary arteries, including calcified atherosclerotic plaque in the left main, left anterior descending, left circumflex and right coronary arteries. Status post median sternotomy for CABG, including LIMA to the LAD. Left-sided pacemaker device in position with lead tips terminating in the right atrial appendage and right ventricular apex. No pathologically enlarged mediastinal or  hilar lymph nodes. Please note that accurate exclusion of hilar adenopathy is limited on noncontrast CT scans. Small hiatal hernia. No axillary lymphadenopathy.  Lungs/Pleura: New macro lobulated left upper lobe mass measuring 3.2 x 2.0 x 2.0 cm (image 23 of series 3), highly concerning for bronchogenic neoplasm. Bilateral apical nodular pleuroparenchymal thickening, favored to be areas of chronic post infectious or inflammatory scarring. No other definite suspicious appearing pulmonary nodules or masses are noted. Today's study was not performed as a high-resolution chest CT. There is moderate to severe centrilobular and paraseptal emphysema. Some areas of subpleural reticulation are noted, including some areas of potential honeycombing. Diffuse bronchial wall thickening. Scattered areas of very mild cylindrical bronchiectasis. No acute consolidative airspace disease. No pleural effusions.  Upper Abdomen: Atherosclerosis.  Otherwise, unremarkable.  Musculoskeletal/Soft Tissues: Median sternotomy wires. There are no aggressive appearing lytic or blastic lesions noted in the visualized portions of the skeleton.  IMPRESSION: 1. New macrolobulated left upper lobe mass highly concerning for primary bronchogenic neoplasm. Correlation with PET-CT and/or biopsy is recommended for diagnostic and staging purposes at this time. No definite mediastinal or hilar lymphadenopathy noted on today's noncontrast CT examination. 2. The appearance of the lungs is unusual, and appears to reflect a combination of moderate to severe centrilobular and paraseptal emphysema, as well as some associated interstitial lung disease. The pattern of interstitial lung findings there is a indeterminate, the but suspicious for potential usual interstitial pneumonia (UIP) given the slight progression compared to prior examinations and the presence of areas of apparent honeycombing. 3. Atherosclerosis, including left main and 3 vessel coronary artery  disease. Assessment for potential risk factor modification, dietary therapy or pharmacologic therapy may be warranted, if clinically indicated. These results will be called to the ordering clinician or representative by the Radiologist Assistant, and communication documented in the PACS or zVision Dashboard.   Electronically Signed   By: Vinnie Langton M.D.   On: 08/27/2014 11:33   Nm Pet Image Initial (pi) Skull Base To Thigh  08/30/2014   CLINICAL DATA:  Initial treatment strategy for pulmonary mass. History of idiopathic pulmonary fibrosis with left upper lobe mass.  EXAM: NUCLEAR MEDICINE PET SKULL BASE TO THIGH  TECHNIQUE: 11.6 mCi  F-18 FDG was injected intravenously. Full-ring PET imaging was performed from the skull base to thigh after the radiotracer. CT data was obtained and used for attenuation correction and anatomic localization.  FASTING BLOOD GLUCOSE:  Value: 125 mg/dl  COMPARISON:  CT 08/25/2014  FINDINGS: NECK  No hypermetabolic lymph nodes in the neck.  CHEST  Within the left upper lobe, 3.0 x 2.1 cm hypermetabolic mass is present on image 33, series 6 with SUV max equal 30. There is peribronchial thickening along the left suprahilar mediastinum (image 73) with activity mild metabolic activity. (SUV max 2.9). A prevascular lymph node measuring 11 mm (image 69, series 4) also has low metabolic activity similar to the adjacent aorta. Small high right paratracheal lymph node measuring 7 mm without associated metabolic activity.  There is mild activity distal esophagus is likely physiologic.  There is extensive paraseptal emphysema in frank architectural distortion at the periphery of the lungs. Early honeycombing at the lung bases.  ABDOMEN/PELVIS  No abnormal hypermetabolic activity within the liver, pancreas, adrenal glands, or spleen. No hypermetabolic lymph nodes in the abdomen or pelvis.  SKELETON  No focal hypermetabolic activity to suggest skeletal metastasis.  IMPRESSION: 1. Hypermetabolic  left upper lobe mass consists with primary bronchogenic carcinoma. 2. No definitive evidence of mediastinal metastasis. 3. Peribronchial thickening in the left suprahilar location and mildly metabolic prevascular lymph nodes are indeterminate and may relate to chronic interstitial lung disease.   Electronically Signed   By: Suzy Bouchard M.D.   On: 08/30/2014 09:48   Dg Chest Port 1 View  09/05/2014   CLINICAL DATA:  Status post bronchoscopy with biopsy  EXAM: PORTABLE CHEST - 1 VIEW  COMPARISON:  PET-CT dated 08/30/2014.  CT chest dated 08/27/2014.  FINDINGS: No pneumothorax status post bronchoscopy with biopsy.  Left upper lobe mass-like opacity.  Underlying subpleural reticulation/ fibrosis bilaterally.  Cardiomegaly. Postsurgical changes related to prior CABG. Left subclavian pacemaker.  IMPRESSION: No pneumothorax status post bronchoscopy with biopsy.  Left upper lobe mass-like opacity.   Electronically Signed   By: Julian Hy M.D.   On: 09/05/2014 13:53   Dg C-arm Bronchoscopy  09/05/2014   CLINICAL DATA:    C-ARM BRONCHOSCOPY  Fluoroscopy was utilized by the requesting physician.  No radiographic  interpretation.     Labs:  CBC:  Recent Labs  05/01/14 1717 09/05/14 0754 09/19/14 1229 09/24/14 0730  WBC 10.1 11.2* 9.6 9.2  HGB 14.8 14.4 13.8 12.9*  HCT 44.2 45.3 41.1 39.5  PLT 273.0 302 332.0 305    COAGS:  Recent Labs  09/05/14 0754 09/19/14 1229 09/24/14 0730  INR 1.13 1.1* 1.08  APTT 31  --  30    BMP:  Recent Labs  05/01/14 1717 09/05/14 0754 09/24/14 0730  NA 136 137 138  K 3.9 3.9 4.0  CL 102 102 104  CO2 _0 GLUCOSE 111* 114* 117*  BUN _1 CALCIUM 8.9 8.9 8.9  CREATININE 1.2 1.25 1.14  GFRNONAA  --  56* >60  GFRAA  --  65* >60    LIVER FUNCTION TESTS:  Recent Labs  05/01/14 1717 09/05/14 0754  BILITOT 0.5 0.7  AST 19 22  ALT 14 13  ALKPHOS 68 77  PROT 6.9 7.7  ALBUMIN 3.9 3.4*    TUMOR MARKERS: No results for  input(s): AFPTM, CEA, CA199, CHROMGRNA in the last 8760 hours.  Assessment and Plan: QUADE RAMIREZ is a 74 y.o. male , former smoker,  with remote history of prostate cancer, pulmonary fibrosis, coronary artery disease with pacemaker and recent imaging revealing a hypermetabolic left upper lobe lung mass. Patient underwent nondiagnostic bronchoscopy on 09/05/14 which revealed only a few atypical cells. He presents today for CT-guided left upper lobe lung mass biopsy.Risks and benefits discussed with the patient/wife including, but not limited to bleeding, infection, damage to adjacent structures or low yield requiring additional tests and death. All of the patient's questions were answered, patient is agreeable to proceed. Consent signed and in chart.    Signed: Autumn Messing 09/24/2014, 8:34 AM   I spent a total of 20 minutes face to face in clinical consultation, greater than 50% of which was counseling/coordinating care for CT-guided left upper lobe lung mass biopsy

## 2014-09-24 NOTE — Discharge Instructions (Signed)
Needle Biopsy of Lung, Care After Refer to this sheet in the next few weeks. These instructions provide you with information on caring for yourself after your procedure. Your health care provider may also give you more specific instructions. Your treatment has been planned according to current medical practices, but problems sometimes occur. Call your health care provider if you have any problems or questions after your procedure. WHAT TO EXPECT AFTER THE PROCEDURE  A bandage will be applied over the area where the needle was inserted. You may be asked to apply pressure to the bandage for several minutes to ensure there is minimal bleeding.  In most cases, you can leave when your needle biopsy procedure is completed. Do not drive yourself home. Someone else should take you home.  If you received an IV sedative or general anesthetic, you will be taken to a comfortable place to relax while the medicine wears off.  If you have upcoming travel scheduled, talk to your health care provider about when it is safe to travel by air after the procedure. HOME CARE INSTRUCTIONS  Expect to take it easy for the rest of the day.  Protect the area where you received the needle biopsy by keeping the bandage in place for as long as instructed.  You may feel some mild pain or discomfort in the area, but this should stop in a day or two.  Take medicines only as directed by your health care provider. SEEK MEDICAL CARE IF:   You have pain at the biopsy site that worsens or is not helped by medicine.  You have swelling or drainage at the needle biopsy site.  You have a fever. SEEK IMMEDIATE MEDICAL CARE IF:   You have new or worsening shortness of breath.  You have chest pain.  You are coughing up blood.  You have bleeding that does not stop with pressure or a bandage.  You develop light-headedness or fainting. Document Released: 03/08/2007 Document Revised: 09/25/2013 Document Reviewed:  10/03/2012 [facility] Patient Information 2015 [place], [place]. This information is not intended to replace advice given to you by your health care provider. Make sure you discuss any questions you have with your health care provider.  May remove dressing and shower 24 to 48 hours post procedure. Do not scrub site. Keep site clean and dry. Notify MD for signs of infection.  Lung Biopsy A lung biopsy is a procedure in which a tissue sample is removed from the lung. The tissue can be examined under a microscope to help diagnose various lung disorders.  LET [facility] CARE PROVIDER KNOW ABOUT:  Any allergies you have.  All medicines you are taking, including vitamins, herbs, eye drops, creams, and over-the-counter medicines.  Previous problems you or members of your family have had with the use of anesthetics.  Any blood disorders or bleeding problems that you have.  Previous surgeries you have had.  Medical conditions you have. RISKS AND COMPLICATIONS Generally, a lung biopsy is a safe procedure. However, problems can occur and include:  Collapse of the lung.   Bleeding.   Infection.  BEFORE THE PROCEDURE  Do not eat or drink anything after midnight on the night before the procedure or as directed by your health care provider.  Ask your health care provider about changing or stopping your regular medicines. This is especially important if you are taking diabetes medicines or blood thinners.  Plan to have someone take you home after the procedure. PROCEDURE Various methods can be used  to perform a lung biopsy:   Needle biopsy. A biopsy needle is inserted into the lung. The needle is used to collect the tissue sample. A CT scanner may be used to guide the needle to the right place in the lung. For this method, a medicine is used to numb the area where the biopsy sample will be taken (local anesthetic).  Bronchoscopy. A flexible tube (bronchoscope) is inserted into your lungs by  going through your mouth or nose. A needle or forceps is passed through the bronchoscope to remove the tissue sample. For this method, medicine may be used to numb the back of your throat.  Open biopsy. A cut (incision) is made in your chest. The tissue sample is then removed using surgical tools. The incision is closed with skin glue, skin adhesive strips, or stitches. For this method, you will be given medicine to make you sleep through the procedure (general anesthetic). AFTER THE PROCEDURE  Your recovery will be assessed and monitored.  You might have soreness and tenderness at the site of the biopsy for a few days after the procedure.  You might have a cough and some soreness in your throat for a few days if a bronchoscope was used. Document Released: 07/30/2004 Document Revised: 09/25/2013 Document Reviewed: 10/23/2012 [facility] Patient Information 2015 [place], [place]. This information is not intended to replace advice given to you by your health care provider. Make sure you discuss any questions you have with your health care provider. Conscious Sedation Sedation is the use of medicines to promote relaxation and relieve discomfort and anxiety. Conscious sedation is a type of sedation. Under conscious sedation you are less alert than normal but are still able to respond to instructions or stimulation. Conscious sedation is used during short medical and dental procedures. It is milder than deep sedation or general anesthesia and allows you to return to your regular activities sooner.  LET [facility] CARE PROVIDER KNOW ABOUT:   Any allergies you have.  All medicines you are taking, including vitamins, herbs, eye drops, creams, and over-the-counter medicines.  Use of steroids (by mouth or creams).  Previous problems you or members of your family have had with the use of anesthetics.  Any blood disorders you have.  Previous surgeries you have had.  Medical conditions you  have.  Possibility of pregnancy, if this applies.  Use of cigarettes, alcohol, or illegal drugs. RISKS AND COMPLICATIONS Generally, this is a safe procedure. However, as with any procedure, problems can occur. Possible problems include:  Oversedation.  Trouble breathing on your own. You may need to have a breathing tube until you are awake and breathing on your own.  Allergic reaction to any of the medicines used for the procedure. BEFORE THE PROCEDURE  You may have blood tests done. These tests can help show how well your kidneys and liver are working. They can also show how well your blood clots.  A physical exam will be done.  Only take medicines as directed by your health care provider. You may need to stop taking medicines (such as blood thinners, aspirin, or nonsteroidal anti-inflammatory drugs) before the procedure.   Do not eat or drink at least 6 hours before the procedure or as directed by your health care provider.  Arrange for a responsible adult, family member, or friend to take you home after the procedure. He or she should stay with you for at least 24 hours after the procedure, until the medicine has worn off. PROCEDURE  An intravenous (IV) catheter will be inserted into one of your veins. Medicine will be able to flow directly into your body through this catheter. You may be given medicine through this tube to help prevent pain and help you relax.  The medical or dental procedure will be done. AFTER THE PROCEDURE  You will stay in a recovery area until the medicine has worn off. Your blood pressure and pulse will be checked.   Depending on the procedure you had, you may be allowed to go home when you can tolerate liquids and your pain is under control. Document Released: 02/03/2001 Document Revised: 05/16/2013 Document Reviewed: 01/16/2013 [facility] Patient Information 2015 [place], [place]. This information is not intended to replace advice given to you by  your health care provider. Make sure you discuss any questions you have with your health care provider.  Conscious Sedation, Adult, Care After Refer to this sheet in the next few weeks. These instructions provide you with information on caring for yourself after your procedure. Your health care provider may also give you more specific instructions. Your treatment has been planned according to current medical practices, but problems sometimes occur. Call your health care provider if you have any problems or questions after your procedure. WHAT TO EXPECT AFTER THE PROCEDURE  After your procedure:  You may feel sleepy, clumsy, and have poor balance for several hours.  Vomiting may occur if you eat too soon after the procedure. HOME CARE INSTRUCTIONS  Do not participate in any activities where you could become injured for at least 24 hours. Do not:  Drive.  Swim.  Ride a bicycle.  Operate heavy machinery.  Cook.  Use power tools.  Climb ladders.  Work from a high place.  Do not make important decisions or sign legal documents until you are improved.  If you vomit, drink water, juice, or soup when you can drink without vomiting. Make sure you have little or no nausea before eating solid foods.  Only take over-the-counter or prescription medicines for pain, discomfort, or fever as directed by your health care provider.  Make sure you and your family fully understand everything about the medicines given to you, including what side effects may occur.  You should not drink alcohol, take sleeping pills, or take medicines that cause drowsiness for at least 24 hours.  If you smoke, do not smoke without supervision.  If you are feeling better, you may resume normal activities 24 hours after you were sedated.  Keep all appointments with your health care provider. SEEK MEDICAL CARE IF:  Your skin is pale or bluish in color.  You continue to feel nauseous or vomit.  Your pain is  getting worse and is not helped by medicine.  You have bleeding or swelling.  You are still sleepy or feeling clumsy after 24 hours. SEEK IMMEDIATE MEDICAL CARE IF:  You develop a rash.  You have difficulty breathing.  You develop any type of allergic problem.  You have a fever. MAKE SURE YOU:  Understand these instructions.  Will watch your condition.  Will get help right away if you are not doing well or get worse. Document Released: 03/01/2013 Document Reviewed: 03/01/2013 [facility] Patient Information 2015 [place], [place]. This information is not intended to replace advice given to you by your health care provider. Make sure you discuss any questions you have with your health care provider.

## 2014-09-25 ENCOUNTER — Encounter: Payer: Self-pay | Admitting: Critical Care Medicine

## 2014-09-25 ENCOUNTER — Telehealth: Payer: Self-pay | Admitting: Critical Care Medicine

## 2014-09-25 DIAGNOSIS — C3492 Malignant neoplasm of unspecified part of left bronchus or lung: Secondary | ICD-10-CM

## 2014-09-25 NOTE — Telephone Encounter (Signed)
Referral sent to [facility] to make this pt an appt in [place] this week Wayne Torres

## 2014-09-25 NOTE — Telephone Encounter (Signed)
Pt needs appt with [facility].  bx pos Squamous cell CA

## 2014-09-25 NOTE — Telephone Encounter (Signed)
Please advise PW thanks

## 2014-09-26 ENCOUNTER — Telehealth: Payer: Self-pay | Admitting: Critical Care Medicine

## 2014-09-26 DIAGNOSIS — C3492 Malignant neoplasm of unspecified part of left bronchus or lung: Secondary | ICD-10-CM

## 2014-09-26 NOTE — Telephone Encounter (Signed)
Called spoke with pt spouse. She reports her daughter and son in law works at [employer] She would prefer spouse gets referred there. Please advise PW thanks

## 2014-09-26 NOTE — Telephone Encounter (Signed)
This is fine with me

## 2014-09-26 NOTE — Telephone Encounter (Signed)
I called and made spouse aware of below. They would prefer go to [facility] and understand why Dr. Joya Gaskins would prefer he stays at [facility]. This is something they feel is best for pt.  She wants the referral placed to Dr. Michell Heinrich at [facility]. I have done so and will forward to PW as an [facility]

## 2014-09-26 NOTE — Telephone Encounter (Signed)
i would prefer he stay at [facility] so we can better coordinate his care.   However ultimately it is the pts preference we have to honor

## 2014-09-28 ENCOUNTER — Encounter: Payer: Self-pay | Admitting: *Deleted

## 2014-09-28 ENCOUNTER — Telehealth: Payer: Self-pay | Admitting: *Deleted

## 2014-09-28 NOTE — Telephone Encounter (Signed)
Called left vm message to call with an appt.  I left my name and phone number

## 2014-09-28 NOTE — [facility] Oncology Navigator Note (Unsigned)
Patient called back.  He has decided to receive treatment at [facility].  I let him know if he changes his mind to just call the cancer center back.

## 2014-10-11 ENCOUNTER — Encounter: Payer: Self-pay | Admitting: Critical Care Medicine

## 2014-10-29 ENCOUNTER — Other Ambulatory Visit: Payer: Self-pay | Admitting: Internal Medicine

## 2014-10-29 ENCOUNTER — Encounter: Payer: Self-pay | Admitting: Internal Medicine

## 2014-10-30 NOTE — Telephone Encounter (Signed)
Rx faxed to pharmacy

## 2014-10-30 NOTE — Telephone Encounter (Signed)
Done hardcopy to Dahlia  

## 2014-12-24 ENCOUNTER — Encounter: Payer: Self-pay | Admitting: Critical Care Medicine

## 2014-12-24 ENCOUNTER — Ambulatory Visit (INDEPENDENT_AMBULATORY_CARE_PROVIDER_SITE_OTHER): Payer: Medicare Other | Admitting: Critical Care Medicine

## 2014-12-24 VITALS — BP 110/72 | HR 58 | Temp 97.7°F | Ht 72.0 in | Wt 232.4 lb

## 2014-12-24 DIAGNOSIS — C3492 Malignant neoplasm of unspecified part of left bronchus or lung: Secondary | ICD-10-CM | POA: Diagnosis not present

## 2014-12-24 DIAGNOSIS — J84112 Idiopathic pulmonary fibrosis: Secondary | ICD-10-CM | POA: Diagnosis not present

## 2014-12-24 NOTE — Patient Instructions (Signed)
We will start process of obtaining Esbriet for your scar tissue in the lungs No other changes except stop fish oil Return 1 month

## 2014-12-24 NOTE — Assessment & Plan Note (Signed)
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

## 2014-12-24 NOTE — Assessment & Plan Note (Signed)
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

## 2014-12-24 NOTE — Progress Notes (Signed)
Subjective:    Patient ID: Wayne Torres, male    DOB: [birth year], 74 y.o.   MRN: [id number]  HPI 12/24/2014 Chief Complaint  Patient presents with  . 4 month follow up    Feels breathing is unchanged.  Occas prod cough with clear mucus.  No SOB, wheezing, chest tightness, or CP at this time.  Finished Radiation Tx for Lung CA approx 2 months ago through [facility].  Dx Lung CA and Rx XRT at [facility].  Hx of IPF uip on cellcept no response so stopped.  Was to see Esbriet but held pending lung CA Rx and Eval.  Pt to go back every 90 days .  Overall doing better.  No real smoking. Min cough. Uses oxygen night time and prn daytime. No real chest pain.    Pt denies any significant sore throat, nasal congestion or excess secretions, fever, chills, sweats, unintended weight loss, pleurtic or exertional chest pain, orthopnea PND, or leg swelling Pt denies any increase in rescue therapy over baseline, denies waking up needing it or having any early am or nocturnal exacerbations of coughing/wheezing/or dyspnea. Pt also denies any obvious fluctuation in symptoms with  weather or environmental change or other alleviating or aggravating factors   Current Medications, Allergies, Complete Past Medical History, Past Surgical History, Family History, and Social History were reviewed in [facility] record per todays encounter:  12/24/2014  Review of Systems  Constitutional: Negative.   HENT: Negative.  Negative for ear pain, postnasal drip, rhinorrhea, sinus pressure, sore throat, trouble swallowing and voice change.   Eyes: Negative.   Respiratory: Positive for cough and shortness of breath. Negative for apnea, choking, chest tightness, wheezing and stridor.   Cardiovascular: Negative.  Negative for chest pain, palpitations and leg swelling.  Gastrointestinal: Negative.  Negative for nausea, vomiting, abdominal pain and abdominal distention.  Genitourinary: Negative.   Musculoskeletal:  Negative.  Negative for myalgias and arthralgias.  Skin: Negative.  Negative for rash.  Allergic/Immunologic: Negative.  Negative for environmental allergies and food allergies.  Neurological: Negative.  Negative for dizziness, syncope, weakness and headaches.  Hematological: Negative.  Negative for adenopathy. Does not bruise/bleed easily.  Psychiatric/Behavioral: Negative.  Negative for sleep disturbance and agitation. The patient is not nervous/anxious.        Objective:   Physical Exam Filed Vitals:   12/24/14 0902 12/24/14 0907  BP: 110/72   Pulse: 58 58  Temp: 97.7 F (36.5 C)   TempSrc: Oral   Height: 6' (1.829 m)   Weight: 232 lb 6.4 oz (105.416 kg)   SpO2: 88% 90%    Gen: Pleasant, well-nourished, in no distress,  normal affect  ENT: No lesions,  mouth clear,  oropharynx clear, no postnasal drip  Neck: No JVD, no TMG, no carotid bruits  Lungs: No use of accessory muscles, no dullness to percussion, bibasilar dry rales   Cardiovascular: RRR, heart sounds normal, no murmur or gallops, no peripheral edema  Abdomen: soft and NT, no HSM,  BS normal  Musculoskeletal: No deformities, no cyanosis or clubbing  Neuro: alert, non focal  Skin: Warm, no lesions or rashes  No results found.        Assessment & Plan:  I personally reviewed all images and lab data in the [facility] system as well as any outside material available during this office visit and agree with the  radiology impressions.   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  Diagnoses and all orders for this visit:  IPF (idiopathic pulmonary fibrosis)  Squamous cell carcinoma of lung, stage I, left    I had an extended discussion with the patient and or family lasting 10 minutes of a 25 minute visit including:  Disease state and need for Esbriet therapy for pulmonary fibrosis

## 2014-12-29 ENCOUNTER — Other Ambulatory Visit: Payer: Self-pay | Admitting: Internal Medicine

## 2015-01-15 ENCOUNTER — Telehealth: Payer: Self-pay | Admitting: Critical Care Medicine

## 2015-01-15 NOTE — Telephone Encounter (Signed)
Pt returning call to [facility] and can be reached @ [phone].Hillery Hunter

## 2015-01-15 NOTE — Telephone Encounter (Signed)
Patient notified.  Nothing further needed. 

## 2015-01-15 NOTE — Telephone Encounter (Signed)
Received fax from [employer]. Per Fax, Esbriet PA is approved and pt has been granted [employer].  Start now medication was delivered to pt on 12/26/14.  Pt will need to contact the pharm to provide verbal consent to ship med.  Pt's contracted specialty pharm is [facility]  Phone # [phone].  lmomtcb for pt to advise of above.

## 2015-01-23 ENCOUNTER — Telehealth: Payer: Self-pay | Admitting: Critical Care Medicine

## 2015-01-23 DIAGNOSIS — J84112 Idiopathic pulmonary fibrosis: Secondary | ICD-10-CM

## 2015-01-23 NOTE — Telephone Encounter (Signed)
lmomtcb x1 

## 2015-01-23 NOTE — Telephone Encounter (Signed)
i am ok with this order for 4L POC pulse

## 2015-01-23 NOTE — Telephone Encounter (Signed)
Called spoke with pt. He reports he has portable O2 but is the smaller tanks. He is wanting a POC that he can charge in his car. He reports he uses 4 liters pulse on his current portable tank he has. Please advise Dr. Joya Gaskins thanks

## 2015-01-24 NOTE — Telephone Encounter (Signed)
Pt called back, aware that order has been approved.  Order placed.  Nothing further needed at this time.

## 2015-02-01 ENCOUNTER — Telehealth: Payer: Self-pay | Admitting: Critical Care Medicine

## 2015-02-01 NOTE — Telephone Encounter (Signed)
Pt returning call and can be reached @ [phone].Wayne Torres

## 2015-02-01 NOTE — Telephone Encounter (Signed)
Called and spoke to patient Informed pt that per Dr Joya Gaskins it is ok to stop esbriet Pt voiced understanding  Pt stated that he has a pending appt with Dr Joya Gaskins on 02-08-15 Pt would like to keep this appt to wrap up things with Dr Joya Gaskins before establishing with another provide  Nothing further is needed at this time.

## 2015-02-01 NOTE — Telephone Encounter (Signed)
Called spoke with pt. He has been on Esbriet for a little over a month. He reports he wants to stop the esbriet. He reports it has been causing him to have a loss of energy, he is now having to use his O2 all the time and before he just needed it at bedtime, and he has been having back pain lower left side since being on esbriet. Pt does have pending appt Tuesday with Dr. Joya Gaskins. Please advise thanks

## 2015-02-01 NOTE — Telephone Encounter (Signed)
Left message for pt to call back

## 2015-02-01 NOTE — Telephone Encounter (Signed)
i am ok with stopping esbriet i would recommend he see dr Chase Caller after tammy and establish with him, he can offer other therapies

## 2015-02-02 ENCOUNTER — Other Ambulatory Visit: Payer: Self-pay | Admitting: Internal Medicine

## 2015-02-05 NOTE — Telephone Encounter (Signed)
Rx faxed to pharmacy

## 2015-02-05 NOTE — Telephone Encounter (Signed)
Done hardcopy to Dahlia  

## 2015-02-06 ENCOUNTER — Ambulatory Visit (INDEPENDENT_AMBULATORY_CARE_PROVIDER_SITE_OTHER): Payer: Medicare Other | Admitting: Internal Medicine

## 2015-02-06 ENCOUNTER — Encounter: Payer: Self-pay | Admitting: Internal Medicine

## 2015-02-06 ENCOUNTER — Encounter: Payer: Self-pay | Admitting: Critical Care Medicine

## 2015-02-06 ENCOUNTER — Ambulatory Visit (INDEPENDENT_AMBULATORY_CARE_PROVIDER_SITE_OTHER): Payer: Medicare Other | Admitting: Critical Care Medicine

## 2015-02-06 VITALS — BP 116/68 | HR 72 | Temp 98.4°F | Ht 72.0 in | Wt 233.0 lb

## 2015-02-06 VITALS — BP 130/80 | HR 50 | Ht 72.0 in | Wt 232.8 lb

## 2015-02-06 DIAGNOSIS — I10 Essential (primary) hypertension: Secondary | ICD-10-CM

## 2015-02-06 DIAGNOSIS — R972 Elevated prostate specific antigen [PSA]: Secondary | ICD-10-CM

## 2015-02-06 DIAGNOSIS — J309 Allergic rhinitis, unspecified: Secondary | ICD-10-CM | POA: Diagnosis not present

## 2015-02-06 DIAGNOSIS — E785 Hyperlipidemia, unspecified: Secondary | ICD-10-CM | POA: Diagnosis not present

## 2015-02-06 DIAGNOSIS — Z23 Encounter for immunization: Secondary | ICD-10-CM | POA: Diagnosis not present

## 2015-02-06 DIAGNOSIS — C3492 Malignant neoplasm of unspecified part of left bronchus or lung: Secondary | ICD-10-CM

## 2015-02-06 DIAGNOSIS — J84112 Idiopathic pulmonary fibrosis: Secondary | ICD-10-CM | POA: Diagnosis not present

## 2015-02-06 DIAGNOSIS — J9611 Chronic respiratory failure with hypoxia: Secondary | ICD-10-CM | POA: Diagnosis not present

## 2015-02-06 DIAGNOSIS — R7302 Impaired glucose tolerance (oral): Secondary | ICD-10-CM

## 2015-02-06 NOTE — Progress Notes (Signed)
Subjective:    Patient ID: Wayne Torres, male    DOB: 1941/04/17, 74 y.o.   MRN: [id number]  HPI 02/06/2015 Chief Complaint  Patient presents with  . Follow-up    pt states he is doing well, his breathing is getting better. pt on O2 with activity and at night.  pulse is set at 4LM with activity. at night continuous on 3LPM. DME: [facility].     Pt was doing well until one week ago, felt poorly.  Noted constant belching. Symptoms better off esbriet .  Pt had SBRT to lung ca and f/u CT was stable.  Min cough . No hemoptysis  Pt denies any significant sore throat, nasal congestion or excess secretions, fever, chills, sweats, unintended weight loss, pleurtic or exertional chest pain, orthopnea PND, or leg swelling Pt denies any increase in rescue therapy over baseline, denies waking up needing it or having any early am or nocturnal exacerbations of coughing/wheezing/or dyspnea. Pt also denies any obvious fluctuation in symptoms with  weather or environmental change or other alleviating or aggravating factors   Current Medications, Allergies, Complete Past Medical History, Past Surgical History, Family History, and Social History were reviewed in [facility] record per todays encounter:  02/06/2015    Review of Systems  Constitutional: Negative.   HENT: Negative.  Negative for ear pain, postnasal drip, rhinorrhea, sinus pressure, sore throat, trouble swallowing and voice change.   Eyes: Negative.   Respiratory: Positive for cough, shortness of breath and wheezing. Negative for apnea, choking, chest tightness and stridor.   Cardiovascular: Negative.  Negative for chest pain, palpitations and leg swelling.  Gastrointestinal: Negative.  Negative for nausea, vomiting, abdominal pain and abdominal distention.  Genitourinary: Negative.   Musculoskeletal: Negative.  Negative for myalgias and arthralgias.  Skin: Negative.  Negative for rash.  Allergic/Immunologic: Negative.   Negative for environmental allergies and food allergies.  Neurological: Negative.  Negative for dizziness, syncope, weakness and headaches.  Hematological: Negative.  Negative for adenopathy. Does not bruise/bleed easily.  Psychiatric/Behavioral: Negative.  Negative for sleep disturbance and agitation. The patient is not nervous/anxious.        Objective:   Physical Exam Filed Vitals:   02/06/15 0954  BP: 130/80  Pulse: 50  Height: 6' (1.829 m)  Weight: 232 lb 12.8 oz (105.597 kg)  SpO2: 89%    Gen: Pleasant, well-nourished, in no distress,  normal affect  ENT: No lesions,  mouth clear,  oropharynx clear, no postnasal drip  Neck: No JVD, no TMG, no carotid bruits  Lungs: No use of accessory muscles, no dullness to percussion, distant breath sounds and dry rales at the bases  Cardiovascular: RRR, heart sounds normal, no murmur or gallops, no peripheral edema  Abdomen: soft and NT, no HSM,  BS normal  Musculoskeletal: No deformities, no cyanosis or clubbing  Neuro: alert, non focal  Skin: Warm, no lesions or rashes  No results found.  Recent CT chest was reviewed CT Chest 11/2014 :  1. Interval decrease in left upper lobe pulmonary mass status post treatment. Stable right middle lobe pulmonary nodule. 2. Enlarged para-aortic lymph node which may relate to patients UIP versus metastatic disease. Recommend continued attention to on follow-up.      Assessment & Plan:  I personally reviewed all images and lab data in the [facility] system as well as any outside material available during this office visit and agree with the  radiology impressions.   IPF (idiopathic pulmonary fibrosis) Idiopathic pulmonary  fibrosis with adverse reactions to Esbriet Plan Discontinue Esbriet Monitor off therapy for now Continue oxygen therapy We'll follow-up with Dr. Chase Caller my partner for long-term pulmonary fibrosis evaluation and treatment  Squamous cell carcinoma of lung, stage I History  of left upper lobe squamous cell carcinoma stage I status post primary radiation treatment with SB RT at [facility] May 2016 No evidence of recurrence on current chest x-ray and CT scan Plan Follow-up will be at [facility] in the cancer clinic  Chronic hypoxemic respiratory failure stable on oxygen therapy This patient continues to use and benefit from her oxygen therapy and has re qualified at this visit for continued oxygen therapy We administered a flu vaccine at this visit    Wayne Torres was seen today for follow-up.  Diagnoses and all orders for this visit:  IPF (idiopathic pulmonary fibrosis)  Squamous cell carcinoma lung, left  Chronic respiratory failure with hypoxia  Encounter for immunization  Squamous cell carcinoma of lung, stage I, left  Other orders -     Flu Vaccine QUAD 36+ mos IM

## 2015-02-06 NOTE — Patient Instructions (Signed)
No change in medications. Return in 3 months with Dr Chase Caller Flu vaccine was given

## 2015-02-06 NOTE — Patient Instructions (Signed)
Please continue all other medications as before, and refills have been done if requested.  Please have the pharmacy call with any other refills you may need.  Please continue your efforts at being more active, low cholesterol diet, and weight control.  You are otherwise up to date with prevention measures today.  Please keep your appointments with your specialists as you may have planned  Please go to the LAB in the Basement (turn left off the elevator) for the tests to be done today  You will be contacted by phone if any changes need to be made immediately.  Otherwise, you will receive a letter about your results with an explanation, but please check with MyChart first.  Please remember to sign up for MyChart if you have not done so, as this will be important to you in the future with finding out test results, communicating by private email, and scheduling acute appointments online when needed.  Please return in 6 months, or sooner if needed

## 2015-02-06 NOTE — Progress Notes (Signed)
Subjective:    Patient ID: Wayne Torres, male    DOB: July 31, 1940, 74 y.o.   MRN: [id number]  HPI  Here for yearly f/u;  Overall doing ok;  Pt denies Chest pain, worsening SOB, DOE, wheezing, orthopnea, PND, worsening LE edema, palpitations, dizziness or syncope.  Pt denies neurological change such as new headache, facial or extremity weakness.  Pt denies polydipsia, polyuria, or low sugar symptoms. Pt states overall good compliance with treatment and medications, good tolerability, and has been trying to follow appropriate diet.  Pt denies worsening depressive symptoms, suicidal ideation or panic. No fever, night sweats, wt loss, loss of appetite, or other constitutional symptoms.  Pt states good ability with ADL's, has low fall risk, home safety reviewed and adequate, no other significant changes in hearing or vision, and only occasionally active with exercise.  Has some sinus congestion but minor, does not need further tx for now.  Had flu shot this am. Past Medical History  Diagnosis Date  . GI bleed   . Shoulder joint pain   . Coronary artery disease s/p CABG 1990s     a. approx 1994 s/p CABG x 4 (LIMA->LAD, VG->RCA, VG->RI, VG->Diag);  b. 11/2008 Cath: LM 100, LAD small, RCA 50-60p, 100d, VG->RCA 100 ost, VG->RI 24m VG->Diag 40 - upper branch of diag small, 99%, LIMA-LAD patent.;  c. 08/2011 Echo: EF 45-50%, mod dil LA,/RA/RV with mild-mod reduced RV fxn.  . Back pain   . Hip pain, right   . PSA (psoriatic arthritis)     increased  . Lumbar disc disease   . Malignant neoplasm of prostate   . Fatigue   . Pulmonary fibrosis, postinflammatory     Stopped Cellcept 02/13/13 d/t cost   . Osteoarthritis   . HTN (hypertension)   . Depression   . Anxiety   . ED (erectile dysfunction)   . Hyperlipidemia   . GERD (gastroesophageal reflux disease)   . Allergic rhinitis   . Peptic stricture of esophagus   . Complete heart block     a. s/p SJM Accent dc ppm, ser # [id number] . Chronic  bronchitis 02/07/2012  . PVD (peripheral vascular disease)     a. 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  bilat ext iliac dzs.  . Pneumonia 1993    post-op  . Allergic rhinitis, cause unspecified 02/28/2013  . Shortness of breath dyspnea   . Bipolar disorder   . History of kidney stones    Past Surgical History  Procedure Laterality Date  . Coronary angioplasty  06/03/2007, 01/25/2009  . Nasal septum surgery  april 2012    dr britt, [facility] [place]  . Coronary artery bypass graft  20 yrs ago    LIMA to LAD, SVG to PDA, SVG to Ramus, SVG to D  . Pacemaker placement  08/2011  . Permanent pacemaker insertion N/A 09/14/2011    Procedure: PERMANENT PACEMAKER INSERTION;  Surgeon: SDeboraha Sprang MD;  [facility];  Service: Cardiovascular;  Laterality: N/A;  . Left and right heart catheterization with coronary angiogram N/A 07/05/2012    Procedure: LEFT AND RIGHT HEART CATHETERIZATION WITH CORONARY ANGIOGRAM;  Surgeon: Peter M JMartinique MD;  [facility];  Service: Cardiovascular;  Laterality: N/A;  . Cystoscopy    . Video bronchoscopy with endobronchial ultrasound Left 09/05/2014    Procedure: VIDEO BRONCHOSCOPY WITH ENDOBRONCHIAL ULTRASOUND;  Surgeon: RCollene Gobble MD;  [facility]  Service: Thoracic;  Laterality: Left;    reports that he quit smoking about 13 years ago. His smoking use included Cigarettes. He has a 63 pack-year smoking history. He has never used smokeless tobacco. He reports that he does not drink alcohol or use illicit drugs. family history includes Heart attack in his father; Heart disease in his father. There is no history of Colon cancer or Cancer. Allergies  Allergen Reactions  . Pirfenidone Nausea And Vomiting   Current Outpatient Prescriptions on File Prior to Visit  Medication Sig Dispense Refill  . acetaminophen (TYLENOL) 500 MG tablet Take 1,000 mg by mouth every 6 (six) hours as needed for moderate pain or  headache.    . albuterol (PROVENTIL HFA;VENTOLIN HFA) 108 (90 BASE) MCG/ACT inhaler Inhale 2 puffs into the lungs every 6 (six) hours as needed for wheezing or shortness of breath. 1 Inhaler 11  . ALPRAZolam (XANAX) 0.25 MG tablet TAKE 1 TABLET BY MOUTH EVERY DAY AS NEEDED 30 tablet 2  . amLODipine (NORVASC) 5 MG tablet Take 0.5 tablets (2.5 mg total) by mouth daily. 90 tablet 3  . citalopram (CELEXA) 20 MG tablet TAKE 1 TABLET BY MOUTH EVERY DAY 90 tablet 3  . clindamycin (CLEOCIN) 150 MG capsule     . finasteride (PROSCAR) 5 MG tablet Take 5 mg by mouth daily.     . fluticasone (FLONASE) 50 MCG/ACT nasal spray Place 2 sprays into the nose daily.    Marland Kitchen lisinopril (PRINIVIL,ZESTRIL) 10 MG tablet Take 0.5 tablets (5 mg total) by mouth daily. 90 tablet 3  . Magnesium 500 MG TABS Take 1 tablet by mouth every morning.    . metoprolol succinate (TOPROL-XL) 25 MG 24 hr tablet TAKE 1/2 TABLET BY MOUTH TWICE DAILY 90 tablet 0  . Multiple Vitamins-Minerals (PRESERVISION AREDS PO) Take 1 tablet by mouth 2 (two) times daily.    . nitroGLYCERIN (NITROSTAT) 0.4 MG SL tablet Place 1 tablet (0.4 mg total) under the tongue every 5 (five) minutes as needed for chest pain. 90 tablet 3  . omeprazole (PRILOSEC) 40 MG capsule TAKE ONE CAPSULE BY MOUTH EVERY DAY 90 capsule 3  . OXYGEN Inhale into the lungs. 3 lpm qhs  4 lpm pulsed with exertion as needed DME: [facility]    . simvastatin (ZOCOR) 40 MG tablet Take 1 tablet (40 mg total) by mouth at bedtime. 90 tablet 3  . tamsulosin (FLOMAX) 0.4 MG CAPS capsule Take 0.4 mg by mouth every morning.   11   No current facility-administered medications on file prior to visit.    Review of Systems Constitutional: Negative for increased diaphoresis, other activity, appetite or siginficant weight change other than noted HENT: Negative for worsening hearing loss, ear pain, facial swelling, mouth sores and neck stiffness.   Eyes: Negative for other worsening pain, redness or visual  disturbance.  Respiratory: Negative for shortness of breath and wheezing  Cardiovascular: Negative for chest pain and palpitations.  Gastrointestinal: Negative for diarrhea, blood in stool, abdominal distention or other pain Genitourinary: Negative for hematuria, flank pain or change in urine volume.  Musculoskeletal: Negative for myalgias or other joint complaints.  Skin: Negative for color change and wound or drainage.  Neurological: Negative for syncope and numbness. other than noted Hematological: Negative for adenopathy. or other swelling Psychiatric/Behavioral: Negative for hallucinations, SI, self-injury, decreased concentration or other worsening agitation.      Objective:   Physical Exam BP 116/68 mmHg  Pulse 72  Temp(Src) 98.4 F (36.9 C) (Oral)  Ht 6' (1.829 m)  Wt 233 lb (105.688 kg)  BMI 31.59 kg/m2  SpO2 89% VS noted,  Constitutional: Pt is oriented to person, place, and time. Appears well-developed and well-nourished, in no significant distress Head: Normocephalic and atraumatic.  Right Ear: External ear normal.  Left Ear: External ear normal.  Nose: Nose normal.  Mouth/Throat: Oropharynx is clear and moist.  Eyes: Conjunctivae and EOM are normal. Pupils are equal, round, and reactive to light.  Neck: Normal range of motion. Neck supple. No JVD present. No tracheal deviation present or significant neck LA or mass Cardiovascular: Normal rate, regular rhythm, normal heart sounds and intact distal pulses.   Pulmonary/Chest: Effort normal and breath sounds without rales or wheezing  Abdominal: Soft. Bowel sounds are normal. NT. No HSM  Musculoskeletal: Normal range of motion. Exhibits no edema.  Lymphadenopathy:  Has no cervical adenopathy.  Neurological: Pt is alert and oriented to person, place, and time. Pt has normal reflexes. No cranial nerve deficit. Motor grossly intact Skin: Skin is warm and dry. No rash noted.  Psychiatric:  Has normal mood and affect.  Behavior is normal.     Assessment & Plan:

## 2015-02-06 NOTE — Progress Notes (Signed)
Pre visit review using our clinic review tool, if applicable. No additional management support is needed unless otherwise documented below in the visit note.

## 2015-02-08 ENCOUNTER — Other Ambulatory Visit (INDEPENDENT_AMBULATORY_CARE_PROVIDER_SITE_OTHER): Payer: Medicare Other

## 2015-02-08 DIAGNOSIS — R972 Elevated prostate specific antigen [PSA]: Secondary | ICD-10-CM | POA: Diagnosis not present

## 2015-02-08 DIAGNOSIS — R7302 Impaired glucose tolerance (oral): Secondary | ICD-10-CM

## 2015-02-08 DIAGNOSIS — E785 Hyperlipidemia, unspecified: Secondary | ICD-10-CM

## 2015-02-08 LAB — URINALYSIS, ROUTINE W REFLEX MICROSCOPIC
Bilirubin Urine: NEGATIVE
Hgb urine dipstick: NEGATIVE
Ketones, ur: NEGATIVE
Leukocytes, UA: NEGATIVE
Nitrite: NEGATIVE
PH: 7 (ref 5.0–8.0)
RBC / HPF: NONE SEEN (ref 0–?)
SPECIFIC GRAVITY, URINE: 1.02 (ref 1.000–1.030)
TOTAL PROTEIN, URINE-UPE24: NEGATIVE
URINE GLUCOSE: NEGATIVE
Urobilinogen, UA: 1 (ref 0.0–1.0)

## 2015-02-08 LAB — CBC WITH DIFFERENTIAL/PLATELET
BASOS PCT: 0.4 % (ref 0.0–3.0)
Basophils Absolute: 0 10*3/uL (ref 0.0–0.1)
Eosinophils Absolute: 0.3 10*3/uL (ref 0.0–0.7)
Eosinophils Relative: 3.2 % (ref 0.0–5.0)
HCT: 44 % (ref 39.0–52.0)
Hemoglobin: 14.6 g/dL (ref 13.0–17.0)
Lymphocytes Relative: 10.4 % — ABNORMAL LOW (ref 12.0–46.0)
Lymphs Abs: 1 10*3/uL (ref 0.7–4.0)
MCHC: 33.2 g/dL (ref 30.0–36.0)
MCV: 94.3 fl (ref 78.0–100.0)
MONO ABS: 0.9 10*3/uL (ref 0.1–1.0)
Monocytes Relative: 9 % (ref 3.0–12.0)
NEUTROS ABS: 7.5 10*3/uL (ref 1.4–7.7)
NEUTROS PCT: 77 % (ref 43.0–77.0)
PLATELETS: 269 10*3/uL (ref 150.0–400.0)
RBC: 4.67 Mil/uL (ref 4.22–5.81)
RDW: 14.4 % (ref 11.5–15.5)
WBC: 9.8 10*3/uL (ref 4.0–10.5)

## 2015-02-08 LAB — BASIC METABOLIC PANEL
BUN: 18 mg/dL (ref 6–23)
CALCIUM: 9.1 mg/dL (ref 8.4–10.5)
CHLORIDE: 102 meq/L (ref 96–112)
CO2: 27 meq/L (ref 19–32)
CREATININE: 1.25 mg/dL (ref 0.40–1.50)
GFR: 60.2 mL/min (ref >60.00)
Glucose, Bld: 105 mg/dL — ABNORMAL HIGH (ref 70–99)
POTASSIUM: 4.3 meq/L (ref 3.5–5.1)
SODIUM: 137 meq/L (ref 135–145)

## 2015-02-08 LAB — TSH: TSH: 1.11 u[IU]/mL (ref 0.35–4.50)

## 2015-02-08 LAB — LIPID PANEL
CHOLESTEROL: 88 mg/dL (ref 0–200)
HDL: 27.9 mg/dL — ABNORMAL LOW (ref >39.00)
LDL Cholesterol: 33 mg/dL (ref 0–99)
NonHDL: 59.96
TRIGLYCERIDES: 137 mg/dL (ref 0.0–149.0)
Total CHOL/HDL Ratio: 3
VLDL: 27.4 mg/dL (ref 0.0–40.0)

## 2015-02-08 LAB — HEMOGLOBIN A1C: HEMOGLOBIN A1C: 5.8 % (ref 4.6–6.5)

## 2015-02-08 LAB — HEPATIC FUNCTION PANEL
ALT: 11 U/L (ref 0–53)
AST: 15 U/L (ref 0–37)
Albumin: 3.9 g/dL (ref 3.5–5.2)
Alkaline Phosphatase: 88 U/L (ref 39–117)
BILIRUBIN DIRECT: 0.2 mg/dL (ref 0.0–0.3)
BILIRUBIN TOTAL: 0.8 mg/dL (ref 0.2–1.2)
Total Protein: 6.9 g/dL (ref 6.0–8.3)

## 2015-02-08 LAB — PSA: PSA: 0.89 ng/mL (ref 0.10–4.00)

## 2015-02-08 NOTE — Assessment & Plan Note (Signed)
History of left upper lobe squamous cell carcinoma stage I status post primary radiation treatment with SB RT at [facility] May 2016 No evidence of recurrence on current chest x-ray and CT scan Plan Follow-up will be at [facility] in the cancer clinic

## 2015-02-08 NOTE — Assessment & Plan Note (Signed)
Idiopathic pulmonary fibrosis with adverse reactions to Esbriet Plan Discontinue Esbriet Monitor off therapy for now Continue oxygen therapy We'll follow-up with Dr. Chase Caller my partner for long-term pulmonary fibrosis evaluation and treatment

## 2015-02-10 NOTE — Assessment & Plan Note (Signed)
.  stable overall by history and exam, recent data reviewed with pt, and pt to continue medical treatment as before,  to f/u any worsening symptoms or concerns BP Readings from Last 3 Encounters:  02/06/15 116/68  02/06/15 130/80  12/24/14 110/72

## 2015-02-10 NOTE — Assessment & Plan Note (Signed)
For f/u psa as he is due, asympt,  to f/u any worsening symptoms or concerns

## 2015-02-10 NOTE — Assessment & Plan Note (Signed)
stable overall by history and exam, recent data reviewed with pt, and pt to continue medical treatment as before,  to f/u any worsening symptoms or concerns Lab Results  Component Value Date   LDLCALC 33 02/08/2015

## 2015-02-10 NOTE — Assessment & Plan Note (Signed)
stable overall by history and exam, recent data reviewed with pt, and pt to continue medical treatment as before,  to f/u any worsening symptoms or concerns

## 2015-02-19 ENCOUNTER — Telehealth: Payer: Self-pay | Admitting: Critical Care Medicine

## 2015-02-19 NOTE — Telephone Encounter (Signed)
Called and spoke with Ok Edwards from [employer] Was informed that pt is switching to this company so he can get a Marine scientist  Needs verbal order from provider, and last office note to proceed with order Also stated that she needs order to state that oxygen is to be used QHS and with exertion Information can be faxed to [phone]  Sending message to Dr Romilda Garret as PW is no longer with practice Pt has upcoming appt with MR in 04/2015  MR, are you ok with giving verbal order? Please advise

## 2015-02-19 NOTE — Telephone Encounter (Signed)
Okay please go ahead and send the necessary paperwork to the new DME company so he can get the oxygen is desired. If they need more information but the patient cannot wait till he sees me in December 2016 then best thing is to have him come in for a visit a brother with me or Wayne Torres

## 2015-02-20 NOTE — Telephone Encounter (Signed)
lmtcb for shea

## 2015-02-20 NOTE — Telephone Encounter (Signed)
Ok Edwards returning call and can be reached @ [phone] ext. [phone] needing chart notes and liter per min please advise.Hillery Hunter

## 2015-02-20 NOTE — Telephone Encounter (Signed)
Spoke with Ok Edwards. Advised her of the pt's liter flow. She will need the last OV note faxed to her at [phone]. This has been taken care of. Nothing further was needed.

## 2015-02-20 NOTE — Telephone Encounter (Signed)
lmtcb x1 for [employer].

## 2015-02-20 NOTE — Telephone Encounter (Signed)
[phone] ext [place] calling back

## 2015-02-21 ENCOUNTER — Telehealth: Payer: Self-pay | Admitting: Internal Medicine

## 2015-02-21 ENCOUNTER — Telehealth: Payer: Self-pay | Admitting: Critical Care Medicine

## 2015-02-21 NOTE — Telephone Encounter (Signed)
Called spoke with pt spouse. Pt is wanting to get a POC but does not have qualifying sats on file within 30 days. Pt has pending appt with MR 05/09/14. MR, are you okay if we bring pt in for a qualifying walk under you? thanks

## 2015-02-21 NOTE — Telephone Encounter (Signed)
Called spoke with Ok Edwards. She is aware of DX in part chart from last OV. She is still working on this. Nothing further needed

## 2015-02-22 NOTE — Telephone Encounter (Signed)
Yeah that is fine

## 2015-02-22 NOTE — Telephone Encounter (Signed)
Called and spoke to pt's wife Pt scheduled to come to office for qualifying walk on 02/25/2015 at 2pm  Nothing further is needed at this time

## 2015-02-25 ENCOUNTER — Ambulatory Visit: Payer: Medicare Other

## 2015-02-25 DIAGNOSIS — J9611 Chronic respiratory failure with hypoxia: Secondary | ICD-10-CM

## 2015-02-26 ENCOUNTER — Telehealth: Payer: Self-pay | Admitting: Internal Medicine

## 2015-02-26 ENCOUNTER — Encounter: Payer: Self-pay | Admitting: *Deleted

## 2015-02-26 NOTE — Telephone Encounter (Signed)
Pt did not come to office for 6 min walk Pt came to office for qualifying walk to qualify for POC Pt was at 83% on RA before walk began Pt was instructed to place O2 on at his normal 4L pulse that he states he was using at home and ambulated around office Pt able to do one lap with O2 sats at 87% on 4LP  Paperwork has been filled out accordingly and is waiting for Dr Robyne Peers signature to fax back Paperwork will be given to MR nurse to follow up on   Called and left message for Ok Edwards stating that walk had been done and that we are waiting on the physician to sign paperwork

## 2015-02-26 NOTE — Telephone Encounter (Signed)
Return call from shea let her know that MR has paperwork and we are awaiting his signature.Hillery Hunter

## 2015-02-26 NOTE — Telephone Encounter (Signed)
Called and spoke with Wayne Torres, she is calling to set up patient's oxygen.  She said that she received a call from [facility] and that Wayne Torres did a 6 minute walk on patient and advised her that patient needed 3L O2 at rest and 4Lo2 at exertion.  These settings are the settings that were documented by Dr. Joya Gaskins in 4/16.  I do not see any orders in the chart regarding o2 settings.  Nor do I see the 6 minute walk results in the chart.  Wayne Torres needs the 6 minute Walk results faxed to her and needs settings in order to get the oxygen set up.   Wayne Torres - please advise.

## 2015-02-27 ENCOUNTER — Telehealth: Payer: Self-pay | Admitting: Internal Medicine

## 2015-02-27 NOTE — Telephone Encounter (Signed)
lmtcb x1 for [employer].

## 2015-02-27 NOTE — Telephone Encounter (Signed)
Spoke with Ok Edwards. Gave her the verbal of pt's walk. She will need documentation of his walk. This will be faxed to [phone].

## 2015-02-28 NOTE — Telephone Encounter (Signed)
I have forms. Once signed by MR they will be faxed. Nothing further needed at this time.

## 2015-03-20 ENCOUNTER — Ambulatory Visit (INDEPENDENT_AMBULATORY_CARE_PROVIDER_SITE_OTHER): Payer: Medicare Other | Admitting: Internal Medicine

## 2015-03-20 ENCOUNTER — Encounter: Payer: Self-pay | Admitting: Internal Medicine

## 2015-03-20 VITALS — BP 90/58 | HR 59 | Ht 73.0 in | Wt 229.4 lb

## 2015-03-20 DIAGNOSIS — I442 Atrioventricular block, complete: Secondary | ICD-10-CM | POA: Diagnosis not present

## 2015-03-20 DIAGNOSIS — I255 Ischemic cardiomyopathy: Secondary | ICD-10-CM

## 2015-03-20 DIAGNOSIS — I5022 Chronic systolic (congestive) heart failure: Secondary | ICD-10-CM | POA: Diagnosis not present

## 2015-03-20 DIAGNOSIS — I25700 Atherosclerosis of coronary artery bypass graft(s), unspecified, with unstable angina pectoris: Secondary | ICD-10-CM | POA: Diagnosis not present

## 2015-03-20 DIAGNOSIS — Z950 Presence of cardiac pacemaker: Secondary | ICD-10-CM | POA: Diagnosis not present

## 2015-03-20 DIAGNOSIS — I519 Heart disease, unspecified: Secondary | ICD-10-CM

## 2015-03-20 LAB — CUP PACEART INCLINIC DEVICE CHECK
Implantable Lead Implant Date: 20130422
Implantable Lead Location: 753859
Implantable Lead Location: 753860
Lead Channel Impedance Value: 462.5 Ohm
Lead Channel Impedance Value: 562.5 Ohm
Lead Channel Pacing Threshold Pulse Width: 0.4 ms
Lead Channel Sensing Intrinsic Amplitude: 12 mV
Lead Channel Setting Pacing Amplitude: 1.5 V
Lead Channel Setting Pacing Pulse Width: 0.4 ms
MDC IDC LEAD IMPLANT DT: 20130422
MDC IDC MSMT BATTERY REMAINING LONGEVITY: 112.8
MDC IDC MSMT BATTERY VOLTAGE: 2.93 V
MDC IDC MSMT LEADCHNL RA PACING THRESHOLD AMPLITUDE: 0.5 V
MDC IDC MSMT LEADCHNL RA PACING THRESHOLD PULSEWIDTH: 0.4 ms
MDC IDC MSMT LEADCHNL RA SENSING INTR AMPL: 5 mV
MDC IDC MSMT LEADCHNL RV PACING THRESHOLD AMPLITUDE: 0.625 V
MDC IDC SESS DTM: 20161026133730
MDC IDC SET LEADCHNL RV PACING AMPLITUDE: 0.875
MDC IDC SET LEADCHNL RV SENSING SENSITIVITY: 2 mV
MDC IDC STAT BRADY RA PERCENT PACED: 41 %
MDC IDC STAT BRADY RV PERCENT PACED: 93 %
Pulse Gen Serial Number: 7327490

## 2015-03-20 NOTE — Progress Notes (Signed)
Patient Care Team: Biagio Borg, MD as PCP - General (Internal Medicine) [name], MD as Referring Physician (Cardiology) Elsie Stain, MD as Attending Physician (Pulmonary Disease)   HPI  Wayne Torres is a 74 y.o. male Seen in followup for pacemaker implanted April 2013 for high-grade heart block.  He hAS  a history of remote bypass surgery He underwent catheterization last year and there has been interval decrease in left ventricular function to 30%. Global hypokinesis. His LIMA was patent. His vein graft to his obtuse marginal and diagonal was patent. Vein graft to the RCA was occluded.  He denies significant shortness of breath that he does not ascribe to his pulmonary fibrosis.   Echocardiogram 7/15 demonstrated an ejection fraction of 35-40%  He has intercurrently been diagnosed with lung cancer squamous cell stage I for which she is followed at [facility]. He has just finished radiation therapy. He is now on chronic oxygen. He also has a history of pulmonary fibrosis. Most recent evaluation demonstrated no progression  He denies  dizziness; it has been no syncope. Lightheadedness   Past Medical History  Diagnosis Date  . GI bleed   . Shoulder joint pain   . Coronary artery disease s/p CABG 1990s     a. approx 1994 s/p CABG x 4 (LIMA->LAD, VG->RCA, VG->RI, VG->Diag);  b. 11/2008 Cath: LM 100, LAD small, RCA 50-60p, 100d, VG->RCA 100 ost, VG->RI 11m VG->Diag 40 - upper branch of diag small, 99%, LIMA-LAD patent.;  c. 08/2011 Echo: EF 45-50%, mod dil LA,/RA/RV with mild-mod reduced RV fxn.  . Back pain   . Hip pain, right   . PSA (psoriatic arthritis) ([place]     increased  . Lumbar disc disease   . Malignant neoplasm of prostate ([place]   . Fatigue   . Pulmonary fibrosis, postinflammatory (HCC)     Stopped Cellcept 02/13/13 d/t cost   . Osteoarthritis   . HTN (hypertension)   . Depression   . Anxiety   . ED (erectile dysfunction)   . Hyperlipidemia     . GERD (gastroesophageal reflux disease)   . Allergic rhinitis   . Peptic stricture of esophagus   . Complete heart block (HCC)     a. s/p SJM Accent dc ppm, ser # [id number] . Chronic bronchitis ([place] 02/07/2012  . PVD (peripheral vascular disease) ([place]     a. 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  bilat ext iliac dzs.  . Pneumonia 1993    post-op  . Allergic rhinitis, cause unspecified 02/28/2013  . Shortness of breath dyspnea   . Bipolar disorder ([place]   . History of kidney stones     Past Surgical History  Procedure Laterality Date  . Coronary angioplasty  06/03/2007, 01/25/2009  . Nasal septum surgery  april 2012    dr britt, [facility] [place]  . Coronary artery bypass graft  20 yrs ago    LIMA to LAD, SVG to PDA, SVG to Ramus, SVG to D  . Pacemaker placement  08/2011  . Permanent pacemaker insertion N/A 09/14/2011    Procedure: PERMANENT PACEMAKER INSERTION;  Surgeon: SDeboraha Sprang MD;  [facility];  Service: Cardiovascular;  Laterality: N/A;  . Left and right heart catheterization with coronary angiogram N/A 07/05/2012    Procedure: LEFT AND RIGHT HEART CATHETERIZATION WITH CORONARY ANGIOGRAM;  Surgeon: Peter M JMartinique MD;  [facility];  Service: Cardiovascular;  Laterality: N/A;  . Cystoscopy    . Video bronchoscopy with endobronchial ultrasound Left 09/05/2014    Procedure: VIDEO BRONCHOSCOPY WITH ENDOBRONCHIAL ULTRASOUND;  Surgeon: Collene Gobble, MD;  Location: [facility] OR;  Service: Thoracic;  Laterality: Left;    Current Outpatient Prescriptions  Medication Sig Dispense Refill  . acetaminophen (TYLENOL) 500 MG tablet Take 1,000 mg by mouth every 6 (six) hours as needed for moderate pain or headache.    . albuterol (PROVENTIL HFA;VENTOLIN HFA) 108 (90 BASE) MCG/ACT inhaler Inhale 2 puffs into the lungs every 6 (six) hours as needed for wheezing or shortness of breath. 1 Inhaler 11  . ALPRAZolam (XANAX) 0.25 MG tablet TAKE 1  TABLET BY MOUTH EVERY DAY AS NEEDED 30 tablet 2  . amLODipine (NORVASC) 5 MG tablet Take 0.5 tablets (2.5 mg total) by mouth daily. 90 tablet 3  . citalopram (CELEXA) 20 MG tablet TAKE 1 TABLET BY MOUTH EVERY DAY 90 tablet 3  . clindamycin (CLEOCIN) 150 MG capsule     . finasteride (PROSCAR) 5 MG tablet Take 5 mg by mouth daily.     . fluticasone (FLONASE) 50 MCG/ACT nasal spray Place 2 sprays into the nose daily.    Marland Kitchen lisinopril (PRINIVIL,ZESTRIL) 10 MG tablet Take 0.5 tablets (5 mg total) by mouth daily. 90 tablet 3  . Magnesium 500 MG TABS Take 1 tablet by mouth every morning.    . metoprolol succinate (TOPROL-XL) 25 MG 24 hr tablet TAKE 1/2 TABLET BY MOUTH TWICE DAILY 90 tablet 0  . Multiple Vitamins-Minerals (PRESERVISION AREDS PO) Take 1 tablet by mouth 2 (two) times daily.    . nitroGLYCERIN (NITROSTAT) 0.4 MG SL tablet Place 1 tablet (0.4 mg total) under the tongue every 5 (five) minutes as needed for chest pain. 90 tablet 3  . omeprazole (PRILOSEC) 40 MG capsule TAKE ONE CAPSULE BY MOUTH EVERY DAY 90 capsule 3  . OXYGEN Inhale into the lungs. 3 lpm qhs  4 lpm pulsed with exertion as needed DME: [facility]    . predniSONE (DELTASONE) 20 MG tablet Take as directed  0  . simvastatin (ZOCOR) 40 MG tablet Take 1 tablet (40 mg total) by mouth at bedtime. 90 tablet 3  . tamsulosin (FLOMAX) 0.4 MG CAPS capsule Take 0.4 mg by mouth every morning.   11   No current facility-administered medications for this visit.    Allergies  Allergen Reactions  . Pirfenidone Nausea And Vomiting    Review of Systems negative except from HPI and PMH  Physical Exam BP 90/58 mmHg  Pulse 59  Ht _0  (1.854 m)  Wt 229 lb 6.4 oz (104.055 kg)  BMI 30.27 kg/m2 Well developed and well nourished in no acute distress HENT normal E scleral and icterus clear Neck Supple JVP flat; carotids brisk and full Clear to ausculation Device pocket well healed; without hematoma or erythema.  There is no tethering Regular  rate and rhythm, no murmurs gallops or rub Soft with active bowel sounds No clubbing cyanosis  Edema Alert and oriented, grossly normal motor and sensory function Skin Warm and Dry    ECG demonstrates P. synchronous pacing Intervals 20/20/48  Assessment and  Plan  Ischemic cardiomyopathy  Complete heart block  Hypotension  Pacemaker-St. Jude  The patient's device was interrogated.  The information was reviewed. No changes were made in the programming.    Coronary artery disease   Congestive heart failure-chronic-systolic      He is euvolemic. Continue  him on his current medications.  Without symptoms of ischemia. I'm concerned about LV dysfunction and to maintain his dyspnea; we will repeat an ultrasound.   Given his low blood pressure, at this point I will discontinue his amlodipine.  He is scheduled to follow-up with Dr. Harrington Challenger 12/16

## 2015-03-20 NOTE — Patient Instructions (Signed)
Medication Instructions: 1) Stop amlodipine  Labwork: - none  Procedures/Testing: - Your physician has requested that you have an echocardiogram. Echocardiography is a painless test that uses sound waves to create images of your heart. It provides your doctor with information about the size and shape of your heart and how well your heart's chambers and valves are working. This procedure takes approximately one hour. There are no restrictions for this procedure.  Follow-Up: - Remote monitoring is used to monitor your Pacemaker of ICD from home. This monitoring reduces the number of office visits required to check your device to one time per year. It allows [place] to keep an eye on the functioning of your device to ensure it is working properly. You are scheduled for a device check from home on 06/19/15. You may send your transmission at any time that day. If you have a wireless device, the transmission will be sent automatically. After your physician reviews your transmission, you will receive a postcard with your next transmission date.  - Your physician wants you to follow-up in: 1 year with Dr. Caryl Comes. You will receive a reminder letter in the mail two months in advance. If you don't receive a letter, please call our office to schedule the follow-up appointment.\  Any Additional Special Instructions Will Be Listed Below (If Applicable).

## 2015-03-29 ENCOUNTER — Ambulatory Visit (HOSPITAL_COMMUNITY): Payer: Medicare Other | Attending: Cardiology

## 2015-03-29 ENCOUNTER — Other Ambulatory Visit: Payer: Self-pay

## 2015-03-29 DIAGNOSIS — Z950 Presence of cardiac pacemaker: Secondary | ICD-10-CM

## 2015-03-29 DIAGNOSIS — I25700 Atherosclerosis of coronary artery bypass graft(s), unspecified, with unstable angina pectoris: Secondary | ICD-10-CM

## 2015-03-29 DIAGNOSIS — I517 Cardiomegaly: Secondary | ICD-10-CM | POA: Diagnosis not present

## 2015-03-29 DIAGNOSIS — I4891 Unspecified atrial fibrillation: Secondary | ICD-10-CM | POA: Diagnosis not present

## 2015-03-29 DIAGNOSIS — I255 Ischemic cardiomyopathy: Secondary | ICD-10-CM

## 2015-03-29 DIAGNOSIS — I10 Essential (primary) hypertension: Secondary | ICD-10-CM | POA: Diagnosis not present

## 2015-03-29 DIAGNOSIS — I071 Rheumatic tricuspid insufficiency: Secondary | ICD-10-CM | POA: Diagnosis not present

## 2015-03-29 DIAGNOSIS — I5022 Chronic systolic (congestive) heart failure: Secondary | ICD-10-CM

## 2015-03-29 DIAGNOSIS — I442 Atrioventricular block, complete: Secondary | ICD-10-CM | POA: Diagnosis not present

## 2015-03-29 DIAGNOSIS — I519 Heart disease, unspecified: Secondary | ICD-10-CM

## 2015-03-29 DIAGNOSIS — Z87891 Personal history of nicotine dependence: Secondary | ICD-10-CM | POA: Diagnosis not present

## 2015-03-29 DIAGNOSIS — E785 Hyperlipidemia, unspecified: Secondary | ICD-10-CM | POA: Insufficient documentation

## 2015-04-01 ENCOUNTER — Other Ambulatory Visit: Payer: Self-pay | Admitting: Internal Medicine

## 2015-04-19 ENCOUNTER — Ambulatory Visit: Payer: Medicare Other | Admitting: Internal Medicine

## 2015-04-25 ENCOUNTER — Ambulatory Visit (INDEPENDENT_AMBULATORY_CARE_PROVIDER_SITE_OTHER): Payer: Medicare Other | Admitting: Internal Medicine

## 2015-04-25 ENCOUNTER — Encounter: Payer: Self-pay | Admitting: Internal Medicine

## 2015-04-25 VITALS — BP 106/78 | HR 75 | Ht 72.0 in | Wt 225.4 lb

## 2015-04-25 DIAGNOSIS — E785 Hyperlipidemia, unspecified: Secondary | ICD-10-CM

## 2015-04-25 DIAGNOSIS — I5022 Chronic systolic (congestive) heart failure: Secondary | ICD-10-CM

## 2015-04-25 DIAGNOSIS — I255 Ischemic cardiomyopathy: Secondary | ICD-10-CM | POA: Diagnosis not present

## 2015-04-25 DIAGNOSIS — I10 Essential (primary) hypertension: Secondary | ICD-10-CM

## 2015-04-25 NOTE — Progress Notes (Signed)
Cardiology Office Note   Date:  04/25/2015   ID:  Wayne Torres, DOB 1940-08-15, MRN [id number]  PCP:  Cathlean Cower, MD  Cardiologist:   Dorris Carnes, MD   Chief Complaint  Patient presents with  . Follow-up  . Coronary Artery Disease      History of Present Illness: Wayne Torres is a 74 y.o. male with a history of CAD  S/P CABG  And chronic systolic CHF    I saw him in clinic in march Since seen he has also been seen by Olin Pia earlier this fall  BP was 90/  Amlodipine was discontinued  After he stopped amlodipine he had an episode of dizziness  Felt dizzy for about 2 wks  Drunk like.  Resumed.  Has felt better since.   He denies CP  No SOB unless walks a lot      Current Outpatient Prescriptions  Medication Sig Dispense Refill  . acetaminophen (TYLENOL) 500 MG tablet Take 1,000 mg by mouth every 6 (six) hours as needed for moderate pain or headache.    . albuterol (PROVENTIL HFA;VENTOLIN HFA) 108 (90 BASE) MCG/ACT inhaler Inhale 2 puffs into the lungs every 6 (six) hours as needed for wheezing or shortness of breath. 1 Inhaler 11  . ALPRAZolam (XANAX) 0.25 MG tablet TAKE 1 TABLET BY MOUTH EVERY DAY AS NEEDED 30 tablet 2  . amLODipine (NORVASC) 5 MG tablet Take 5 mg by mouth daily. Take one-half tablet daily    . citalopram (CELEXA) 20 MG tablet TAKE 1 TABLET BY MOUTH EVERY DAY 90 tablet 3  . clindamycin (CLEOCIN) 150 MG capsule     . finasteride (PROSCAR) 5 MG tablet Take 5 mg by mouth daily.     . fluticasone (FLONASE) 50 MCG/ACT nasal spray Place 2 sprays into the nose daily.    Marland Kitchen lisinopril (PRINIVIL,ZESTRIL) 10 MG tablet Take 0.5 tablets (5 mg total) by mouth daily. 90 tablet 3  . Magnesium 500 MG TABS Take 1 tablet by mouth every morning.    . metoprolol succinate (TOPROL-XL) 25 MG 24 hr tablet TAKE 1/2 TABLET BY MOUTH TWICE DAILY 90 tablet 0  . Multiple Vitamins-Minerals (PRESERVISION AREDS PO) Take 1 tablet by mouth 2 (two) times daily.    . nitroGLYCERIN  (NITROSTAT) 0.4 MG SL tablet Place 1 tablet (0.4 mg total) under the tongue every 5 (five) minutes as needed for chest pain. 90 tablet 3  . omeprazole (PRILOSEC) 40 MG capsule TAKE ONE CAPSULE BY MOUTH EVERY DAY 90 capsule 3  . OXYGEN Inhale into the lungs. 3 lpm qhs  4 lpm pulsed with exertion as needed DME: [facility]    . predniSONE (DELTASONE) 20 MG tablet Take as directed  0  . simvastatin (ZOCOR) 40 MG tablet TAKE 1 TABLET BY MOUTH AT BEDTIME 90 tablet 0  . tamsulosin (FLOMAX) 0.4 MG CAPS capsule Take 0.4 mg by mouth every morning.   11   No current facility-administered medications for this visit.    Allergies:   Pirfenidone   Past Medical History  Diagnosis Date  . GI bleed   . Shoulder joint pain   . Coronary artery disease s/p CABG 1990s     a. approx 1994 s/p CABG x 4 (LIMA->LAD, VG->RCA, VG->RI, VG->Diag);  b. 11/2008 Cath: LM 100, LAD small, RCA 50-60p, 100d, VG->RCA 100 ost, VG->RI 66m VG->Diag 40 - upper branch of diag small, 99%, LIMA-LAD patent.;  c. 08/2011 Echo: EF 45-50%, mod dil LA,/RA/RV  with mild-mod reduced RV fxn.  . Back pain   . Hip pain, right   . PSA (psoriatic arthritis) ([place])     increased  . Lumbar disc disease   . Malignant neoplasm of prostate ([place])   . Fatigue   . Pulmonary fibrosis, postinflammatory (HCC)     Stopped Cellcept 02/13/13 d/t cost   . Osteoarthritis   . HTN (hypertension)   . Depression   . Anxiety   . ED (erectile dysfunction)   . Hyperlipidemia   . GERD (gastroesophageal reflux disease)   . Allergic rhinitis   . Peptic stricture of esophagus   . Complete heart block (HCC)     a. s/p SJM Accent dc ppm, ser # [id number]  . Chronic bronchitis ([place]) 02/07/2012  . PVD (peripheral vascular disease) ([place])     a. 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  bilat ext iliac dzs.  . Pneumonia 1993    post-op  . Allergic rhinitis, cause unspecified 02/28/2013  . Shortness of breath dyspnea   . Bipolar disorder  ([place])   . History of kidney stones     Past Surgical History  Procedure Laterality Date  . Coronary angioplasty  06/03/2007, 01/25/2009  . Nasal septum surgery  april 2012    dr britt, [facility] - [place]  . Coronary artery bypass graft  20 yrs ago    LIMA to LAD, SVG to PDA, SVG to Ramus, SVG to D  . Pacemaker placement  08/2011  . Permanent pacemaker insertion N/A 09/14/2011    Procedure: PERMANENT PACEMAKER INSERTION;  Surgeon: Deboraha Sprang, MD;  Location: [facility] CATH LAB;  Service: Cardiovascular;  Laterality: N/A;  . Left and right heart catheterization with coronary angiogram N/A 07/05/2012    Procedure: LEFT AND RIGHT HEART CATHETERIZATION WITH CORONARY ANGIOGRAM;  Surgeon: Peter M Martinique, MD;  Location: [facility] CATH LAB;  Service: Cardiovascular;  Laterality: N/A;  . Cystoscopy    . Video bronchoscopy with endobronchial ultrasound Left 09/05/2014    Procedure: VIDEO BRONCHOSCOPY WITH ENDOBRONCHIAL ULTRASOUND;  Surgeon: Collene Gobble, MD;  Location: [place];  Service: Thoracic;  Laterality: Left;     Social History:  The patient  reports that he quit smoking about 13 years ago. His smoking use included Cigarettes. He has a 63 pack-year smoking history. He has never used smokeless tobacco. He reports that he does not drink alcohol or use illicit drugs.   Family History:  The patient's family history includes Heart attack in his father; Heart disease in his father. There is no history of Colon cancer or Cancer.    ROS:  Please see the history of present illness. All other systems are reviewed and  Negative to the above problem except as noted.    PHYSICAL EXAM: VS:  BP 106/78 mmHg  Pulse 75  Ht 6' (1.829 m)  Wt 102.241 kg (225 lb 6.4 oz)  BMI 30.56 kg/m2  GEN: Well nourished, well developed, in no acute distress HEENT: normal Neck: no JVD, carotid bruits, or masses Cardiac: RRR; no murmurs, rubs, or gallops,no edema  Respiratory:  clear to auscultation bilaterally, normal work of  breathing GI: soft, nontender, nondistended, + BS  No hepatomegaly  MS: no deformity Moving all extremities   Skin: warm and dry, no rash Neuro:  Strength and sensation are intact Psych: euthymic mood, full affect   EKG:  EKG is not ordered today.   Lipid Panel    Component  Value Date/Time   CHOL 88 02/08/2015 1211   TRIG 137.0 02/08/2015 1211   HDL 27.90* 02/08/2015 1211   CHOLHDL 3 02/08/2015 1211   VLDL 27.4 02/08/2015 1211   LDLCALC 33 02/08/2015 1211   LDLDIRECT 49.4 05/01/2014 1717      Wt Readings from Last 3 Encounters:  04/25/15 102.241 kg (225 lb 6.4 oz)  03/20/15 104.055 kg (229 lb 6.4 oz)  02/06/15 105.688 kg (233 lb)      ASSESSMENT AND PLAN:  1.  CAD  No symptoms of angina  I would keep on current regine  2  Chronic systolic CHF  Volume status looks good  I would keep on same regimen  3.  HTN  BP is OK today  I cannot explain why he was dizzy off of amlodipine  He is feeling OK now  I would continue .   4.  HL  Keep on simvistatin       Signed, Dorris Carnes, MD  04/25/2015 3:32 PM    [address], [place], [place]  [address] Phone: [phone]; Fax: [phone]

## 2015-04-25 NOTE — Patient Instructions (Signed)
Your physician recommends that you continue on your current medications as directed. Please refer to the Current Medication list given to you today. Your physician wants you to follow-up in: April 2017 with Dr. Harrington Challenger.  You will receive a reminder letter in the mail two months in advance. If you don't receive a letter, please call our office to schedule the follow-up appointment.

## 2015-05-03 ENCOUNTER — Other Ambulatory Visit: Payer: Self-pay | Admitting: Internal Medicine

## 2015-05-03 NOTE — Telephone Encounter (Signed)
Rx faxed to pharmacy  

## 2015-05-03 NOTE — Telephone Encounter (Signed)
Done hardcopy to Dahlia  

## 2015-05-08 ENCOUNTER — Other Ambulatory Visit: Payer: Self-pay | Admitting: Internal Medicine

## 2015-05-10 ENCOUNTER — Encounter: Payer: Self-pay | Admitting: Internal Medicine

## 2015-05-10 ENCOUNTER — Ambulatory Visit (INDEPENDENT_AMBULATORY_CARE_PROVIDER_SITE_OTHER): Payer: Medicare Other | Admitting: Internal Medicine

## 2015-05-10 ENCOUNTER — Other Ambulatory Visit (INDEPENDENT_AMBULATORY_CARE_PROVIDER_SITE_OTHER): Payer: Medicare Other

## 2015-05-10 VITALS — BP 110/68 | HR 60 | Ht 72.0 in | Wt 224.0 lb

## 2015-05-10 DIAGNOSIS — I255 Ischemic cardiomyopathy: Secondary | ICD-10-CM

## 2015-05-10 DIAGNOSIS — R5383 Other fatigue: Secondary | ICD-10-CM | POA: Diagnosis not present

## 2015-05-10 DIAGNOSIS — J84112 Idiopathic pulmonary fibrosis: Secondary | ICD-10-CM | POA: Diagnosis not present

## 2015-05-10 LAB — BASIC METABOLIC PANEL
BUN: 15 mg/dL (ref 6–23)
CALCIUM: 9.7 mg/dL (ref 8.4–10.5)
CO2: 30 mEq/L (ref 19–32)
CREATININE: 1.34 mg/dL (ref 0.40–1.50)
Chloride: 99 mEq/L (ref 96–112)
GFR: 55.52 mL/min — AB (ref >60.00)
Glucose, Bld: 99 mg/dL (ref 70–99)
Potassium: 4.7 mEq/L (ref 3.5–5.1)
Sodium: 136 mEq/L (ref 135–145)

## 2015-05-10 LAB — HEPATIC FUNCTION PANEL
ALT: 16 U/L (ref 0–53)
AST: 21 U/L (ref 0–37)
Albumin: 4 g/dL (ref 3.5–5.2)
Alkaline Phosphatase: 76 U/L (ref 39–117)
BILIRUBIN DIRECT: 0.2 mg/dL (ref 0.0–0.3)
BILIRUBIN TOTAL: 0.6 mg/dL (ref 0.2–1.2)
Total Protein: 7.4 g/dL (ref 6.0–8.3)

## 2015-05-10 LAB — CBC WITH DIFFERENTIAL/PLATELET
BASOS ABS: 0 10*3/uL (ref 0.0–0.1)
BASOS PCT: 0.2 % (ref 0.0–3.0)
EOS PCT: 4.7 % (ref 0.0–5.0)
Eosinophils Absolute: 0.4 10*3/uL (ref 0.0–0.7)
HEMATOCRIT: 51.9 % (ref 39.0–52.0)
Hemoglobin: 17.1 g/dL — ABNORMAL HIGH (ref 13.0–17.0)
LYMPHS ABS: 1.3 10*3/uL (ref 0.7–4.0)
Lymphocytes Relative: 14.9 % (ref 12.0–46.0)
MCHC: 32.9 g/dL (ref 30.0–36.0)
MCV: 96.7 fl (ref 78.0–100.0)
MONOS PCT: 10.6 % (ref 3.0–12.0)
Monocytes Absolute: 0.9 10*3/uL (ref 0.1–1.0)
NEUTROS ABS: 6.1 10*3/uL (ref 1.4–7.7)
NEUTROS PCT: 69.6 % (ref 43.0–77.0)
PLATELETS: 246 10*3/uL (ref 150.0–400.0)
RBC: 5.37 Mil/uL (ref 4.22–5.81)
RDW: 14.4 % (ref 11.5–15.5)
WBC: 8.8 10*3/uL (ref 4.0–10.5)

## 2015-05-10 NOTE — Progress Notes (Signed)
Subjective:    Patient ID: Wayne Torres, male    DOB: Nov 11, 1940, 74 y.o.   MRN: [id number]  HPI  02/06/2015 Chief Complaint  Patient presents with  . Follow-up    pt states he is doing well, his breathing is getting better. pt on O2 with activity and at night.  pulse is set at 4LM with activity. at night continuous on 3LPM. DME: [facility].     Pt was doing well until one week ago, felt poorly.  Noted constant belching. Symptoms better off esbriet .  Pt had SBRT to lung ca and f/u CT was stable.  Min cough . No hemoptysis  Pt denies any significant sore throat, nasal congestion or excess secretions, fever, chills, sweats, unintended weight loss, pleurtic or exertional chest pain, orthopnea PND, or leg swelling Pt denies any increase in rescue therapy over baseline, denies waking up needing it or having any early am or nocturnal exacerbations of coughing/wheezing/or dyspnea. Pt also denies any obvious fluctuation in symptoms with  weather or environmental change or other alleviating or aggravating factors    OV 05/10/2015  Chief Complaint  Patient presents with  . Follow-up    Former PW pt, seen for IPF. Pt states his breathing is unchanged since OV in Sept. Pt denies cough and CP/tightness.    Former patient of Dr. Asencion Noble has since retired from the practice. Has clinical diagnosis of idiopathic pulmonary fibrosis. In the spring 2016 was diagnosed with non-small cell lung cancer treated with local radiation at [facility] has since then had follow-up scans there. He is on several liters of oxygen. He is here with his wife. He had tried Esbriet in the past approximately spring summer 2016 but had significant GI intolerance. He recollects having had all kindsr intolerance with it. He does not want to try it again. He is interested in trying nintedanib. He does have coronary artery disease but it is not active and is not on anticoagulationwife is also reporting some fatigue  for the last few weeks to several weeks. Reports that [facility] wanted him to have CBC, chemistry, liver function test and some kind of her tumor marker. I reviewed the [facility] chart but could not find any documentation of what tumor marker they might want   Current outpatient prescriptions:  .  acetaminophen (TYLENOL) 500 MG tablet, Take 1,000 mg by mouth every 6 (six) hours as needed for moderate pain or headache., Disp: , Rfl:  .  albuterol (PROVENTIL HFA;VENTOLIN HFA) 108 (90 BASE) MCG/ACT inhaler, Inhale 2 puffs into the lungs every 6 (six) hours as needed for wheezing or shortness of breath., Disp: 1 Inhaler, Rfl: 11 .  ALPRAZolam (XANAX) 0.25 MG tablet, TAKE 1 TABLET BY MOUTH EVERY DAY AS NEEDED, Disp: 30 tablet, Rfl: 2 .  amLODipine (NORVASC) 5 MG tablet, Take 5 mg by mouth daily. Take one-half tablet daily, Disp: , Rfl:  .  citalopram (CELEXA) 20 MG tablet, TAKE 1 TABLET BY MOUTH EVERY DAY, Disp: 90 tablet, Rfl: 3 .  finasteride (PROSCAR) 5 MG tablet, Take 5 mg by mouth daily. , Disp: , Rfl:  .  lisinopril (PRINIVIL,ZESTRIL) 10 MG tablet, Take 0.5 tablets (5 mg total) by mouth daily., Disp: 90 tablet, Rfl: 3 .  Magnesium 500 MG TABS, Take 1 tablet by mouth every morning., Disp: , Rfl:  .  metoprolol succinate (TOPROL-XL) 25 MG 24 hr tablet, TAKE 1/2 TABLET BY MOUTH TWICE DAILY, Disp: 90 tablet,  Rfl: 0 .  Multiple Vitamins-Minerals (PRESERVISION AREDS PO), Take 1 tablet by mouth 2 (two) times daily., Disp: , Rfl:  .  nitroGLYCERIN (NITROSTAT) 0.4 MG SL tablet, Place 1 tablet (0.4 mg total) under the tongue every 5 (five) minutes as needed for chest pain., Disp: 90 tablet, Rfl: 3 .  omeprazole (PRILOSEC) 40 MG capsule, TAKE ONE CAPSULE BY MOUTH EVERY DAY, Disp: 90 capsule, Rfl: 3 .  OXYGEN, Inhale into the lungs. 3 lpm qhs  4 lpm pulsed with exertion as needed DME: [facility], Disp: , Rfl:  .  simvastatin (ZOCOR) 40 MG tablet, TAKE 1 TABLET BY MOUTH AT BEDTIME, Disp: 90  tablet, Rfl: 0 .  tamsulosin (FLOMAX) 0.4 MG CAPS capsule, Take 0.4 mg by mouth every morning. , Disp: , Rfl: 11 .  fluticasone (FLONASE) 50 MCG/ACT nasal spray, Place 2 sprays into the nose daily. Reported on 05/10/2015, Disp: , Rfl:   Immunization History  Administered Date(s) Administered  . Influenza Split 03/20/2011, 02/23/2012  . Influenza Whole 02/23/2000, 05/04/2007, 03/11/2010  . Influenza,inj,Quad PF,36+ Mos 02/13/2013, 04/02/2014, 02/06/2015  . Pneumococcal Conjugate-13 05/01/2014  . Pneumococcal Polysaccharide-23 08/26/2007, 02/13/2013  . Td 03/11/2010    Allergies  Allergen Reactions  . Pirfenidone Nausea And Vomiting     Review of Systems  Constitutional: Positive for fatigue. Negative for fever and unexpected weight change.  HENT: Negative for congestion, dental problem, ear pain, nosebleeds, postnasal drip, rhinorrhea, sinus pressure, sneezing, sore throat and trouble swallowing.   Eyes: Negative for redness and itching.  Respiratory: Positive for cough and shortness of breath. Negative for chest tightness and wheezing.   Cardiovascular: Negative for palpitations and leg swelling.  Gastrointestinal: Negative for nausea and vomiting.  Genitourinary: Negative for dysuria.  Musculoskeletal: Negative for joint swelling.  Skin: Negative for rash.  Neurological: Negative for headaches.  Hematological: Does not bruise/bleed easily.  Psychiatric/Behavioral: Negative for dysphoric mood. The patient is not nervous/anxious.        Objective:   Physical Exam  Constitutional: He is oriented to person, place, and time. He appears well-developed and well-nourished. No distress.  HENT:  Head: Normocephalic and atraumatic.  Right Ear: External ear normal.  Left Ear: External ear normal.  Mouth/Throat: Oropharynx is clear and moist. No oropharyngeal exudate.  On oxygen  Eyes: Conjunctivae and EOM are normal. Pupils are equal, round, and reactive to light. Right eye exhibits  no discharge. Left eye exhibits no discharge. No scleral icterus.  Neck: Normal range of motion. Neck supple. No JVD present. No tracheal deviation present. No thyromegaly present.  Cardiovascular: Normal rate, regular rhythm and intact distal pulses.  Exam reveals no gallop and no friction rub.   No murmur heard. Pulmonary/Chest: Effort normal. No respiratory distress. He has no wheezes. He has rales. He exhibits no tenderness.  Abdominal: Soft. Bowel sounds are normal. He exhibits no distension and no mass. There is no tenderness. There is no rebound and no guarding.  Musculoskeletal: Normal range of motion. He exhibits no edema or tenderness.  Lymphadenopathy:    He has no cervical adenopathy.  Neurological: He is alert and oriented to person, place, and time. He has normal reflexes. No cranial nerve deficit. Coordination normal.  Skin: Skin is warm and dry. No rash noted. He is not diaphoretic. No erythema. No pallor.  Psychiatric: He has a normal mood and affect. His behavior is normal. Judgment and thought content normal.  Nursing note and vitals reviewed.   Filed Vitals:   05/10/15 1216  BP: 110/68  Pulse: 60  Height: 6' (1.829 m)  Weight: 224 lb (101.606 kg)  SpO2: 90%          Assessment & Plan:     ICD-9-CM ICD-10-CM   1. IPF (idiopathic pulmonary fibrosis) (HCC) 516.31 J84.112 CBC w/Diff     Basic Metabolic Panel (BMET)     Hepatic function panel  2. Other fatigue 780.79 R53.83     Start ofev Continue o2 Check cbc, bmet.,lft Could not find what tumor market to check - I reviewed [facility] notes  Followup  6 weeks from now to review propgress on Ofev - see me or my NP Tammy > 50% of this > 25 min visit spent in face to face counseling or coordination of care    Dr. Brand Males, M.D., [facility].C.P Pulmonary and Critical Care Medicine Staff Physician [place] [facility] Pager: [phone], If no answer or between  15:00h -  7:00h: call [phone]  05/10/2015 7:23 PM

## 2015-05-10 NOTE — Patient Instructions (Addendum)
ICD-9-CM ICD-10-CM   1. IPF (idiopathic pulmonary fibrosis) (HCC) 516.31 J84.112   2. Other fatigue 780.79 R53.83     Start ofev Continue o2 Check cbc, bmet.,lft Could not find what tumor market to check - I reviewed [facility] notes  Followup  6 weeks from now to review propgress on Ofev - see me or my NP Tammy

## 2015-05-13 ENCOUNTER — Telehealth: Payer: Self-pay | Admitting: Internal Medicine

## 2015-05-13 NOTE — Telephone Encounter (Signed)
Labs  - normal except Hgb higher than usual -prob because of chronic hypxoxemia  Plan - repeat labs at fu  Dr. Brand Males, M.D., [facility].C.P Pulmonary and Critical Care Medicine Staff Physician [facility] Pager: [phone], If no answer or between  15:00h - 7:00h: call [phone]  05/13/2015 5:16 PM        PULMONARY No results for input(s): PHART, PCO2ART, PO2ART, HCO3, TCO2, O2SAT in the last 168 hours.  Invalid input(s): PCO2, PO2  CBC  Recent Labs Lab 05/10/15 1251  HGB 17.1*  HCT 51.9  WBC 8.8  PLT 246.0    COAGULATION No results for input(s): INR in the last 168 hours.  CARDIAC  No results for input(s): TROPONINI in the last 168 hours. No results for input(s): PROBNP in the last 168 hours.   CHEMISTRY  Recent Labs Lab 05/10/15 1251  NA 136  K 4.7  CL 99  CO2 30  GLUCOSE 99  BUN 15  CREATININE 1.34  CALCIUM 9.7   Estimated Creatinine Clearance: 60.6 mL/min (by C-G formula based on Cr of 1.34).   LIVER  Recent Labs Lab 05/10/15 1251  AST 21  ALT 16  ALKPHOS 76  BILITOT 0.6  PROT 7.4  ALBUMIN 4.0     INFECTIOUS No results for input(s): LATICACIDVEN, PROCALCITON in the last 168 hours.   ENDOCRINE CBG (last 3)  No results for input(s): GLUCAP in the last 72 hours.       IMAGING x48h  - image(s) personally visualized  -   highlighted in bold No results found.

## 2015-05-14 NOTE — Telephone Encounter (Signed)
I spoke with patient about results and he verbalized understanding and had no questions 

## 2015-05-14 NOTE — Telephone Encounter (Signed)
Pt returning call.Wayne Torres ° °

## 2015-05-14 NOTE — Telephone Encounter (Signed)
lmtcb X1 for pt

## 2015-05-15 ENCOUNTER — Telehealth: Payer: Self-pay | Admitting: Internal Medicine

## 2015-05-15 NOTE — Telephone Encounter (Signed)
Pt just wanted a copy mailed to him of his labs- pt had nothing further to discuss regarding labs. Verified mailing address and results placed in mail  Nothing further needed.

## 2015-05-15 NOTE — Telephone Encounter (Signed)
See phon enote 05/13/15 . So not sure if he wants a copy or he is confused or wants more details because results were given to him 2 days ago

## 2015-05-15 NOTE — Telephone Encounter (Signed)
Spoke with pt. States that he would like his blood work results from 05/10/15.  MR - please advise. Thanks.

## 2015-05-17 ENCOUNTER — Telehealth: Payer: Self-pay | Admitting: Internal Medicine

## 2015-05-17 NOTE — Telephone Encounter (Signed)
Ofev forms faxed to [place]. Will await PA.

## 2015-05-21 NOTE — Telephone Encounter (Signed)
No, I have not received a PA on this yet. Will update chart when I have.

## 2015-05-21 NOTE — Telephone Encounter (Signed)
Wayne Torres, have you received a PA or heard anything further on the OFEV?

## 2015-05-22 ENCOUNTER — Other Ambulatory Visit: Payer: Self-pay | Admitting: Internal Medicine

## 2015-05-22 NOTE — Telephone Encounter (Signed)
PA for OFEV initiated through [place]  Your information has been submitted to [facility]. If [facility] has not responded in 3-5 business days, or if you have any questions about you PA submission, contact [place] [employer] at [phone].  Awaiting decision. Will send to MA for follow up

## 2015-05-24 MED ORDER — NINTEDANIB ESYLATE 150 MG PO CAPS: 150.0000 mg | ORAL_CAPSULE | Freq: Two times a day (BID) | ORAL | Status: DC

## 2015-05-24 NOTE — Telephone Encounter (Signed)
Wayne Torres returned our call. I informed her that OFEV was approved. Wayne Torres states that the nursing visit will consist of the nurse going out to the patient's home, assessing them, and reviewing the medication with the patient. The patient will get two visits from a nurse. She stated she will proceed with the nurse counseling. She voiced understanding and had no further questions. Nothing further needed.

## 2015-05-24 NOTE — Telephone Encounter (Signed)
Checked Cover My Meds. A decision has not been made yet.

## 2015-05-24 NOTE — Telephone Encounter (Signed)
Received a call from [place] with [facility]. Jeani Hawking states that the OFEV was shipped out to the patient today and that in the order it does not state if the patient should receive nurse counseling. I explained to Jeani Hawking that when I looked the patient up on cover my med's the status was still pending and I would need to clarify the information the information with cover my meds and Daneil Dan.   I called and spoke with Candace with CMM. She stated that the OFEV did receive approval from 05/23/14 - 05/24/2016 with approval # [id number].   Spoke with Daneil Dan and she confirmed order for nurse counseling.   LVM with Jeani Hawking [phone] ext. [phone]) to confirm approval of OFEV and confirm nurse counceling.  Called and spoke with patient. Informed him that OFEV was approved and was being shipped out. He stated that he received a call on 05/23/15 and they told him it would arrive on 05/25/15. Pt voiced understanding and had no further questions.

## 2015-06-19 ENCOUNTER — Telehealth: Payer: Self-pay | Admitting: Cardiology

## 2015-06-19 ENCOUNTER — Ambulatory Visit (INDEPENDENT_AMBULATORY_CARE_PROVIDER_SITE_OTHER): Payer: Medicare Other | Admitting: *Deleted

## 2015-06-19 DIAGNOSIS — I442 Atrioventricular block, complete: Secondary | ICD-10-CM | POA: Diagnosis not present

## 2015-06-19 NOTE — Telephone Encounter (Signed)
Spoke with pt and reminded pt of remote transmission that is due today. Pt verbalized understanding.   

## 2015-06-19 NOTE — Progress Notes (Signed)
Remote pacemaker transmission.   

## 2015-06-21 ENCOUNTER — Other Ambulatory Visit (INDEPENDENT_AMBULATORY_CARE_PROVIDER_SITE_OTHER): Payer: Medicare Other

## 2015-06-21 ENCOUNTER — Ambulatory Visit (INDEPENDENT_AMBULATORY_CARE_PROVIDER_SITE_OTHER): Payer: Medicare Other | Admitting: Adult Health

## 2015-06-21 ENCOUNTER — Encounter: Payer: Self-pay | Admitting: Adult Health

## 2015-06-21 VITALS — BP 134/86 | HR 65 | Temp 97.6°F | Ht 72.0 in | Wt 231.0 lb

## 2015-06-21 DIAGNOSIS — J9611 Chronic respiratory failure with hypoxia: Secondary | ICD-10-CM

## 2015-06-21 DIAGNOSIS — J84112 Idiopathic pulmonary fibrosis: Secondary | ICD-10-CM

## 2015-06-21 LAB — HEPATIC FUNCTION PANEL
ALK PHOS: 70 U/L (ref 39–117)
ALT: 12 U/L (ref 0–53)
AST: 17 U/L (ref 0–37)
Albumin: 3.9 g/dL (ref 3.5–5.2)
BILIRUBIN DIRECT: 0.2 mg/dL (ref 0.0–0.3)
BILIRUBIN TOTAL: 0.7 mg/dL (ref 0.2–1.2)
Total Protein: 6.8 g/dL (ref 6.0–8.3)

## 2015-06-21 NOTE — Progress Notes (Signed)
Subjective:    Patient ID: Wayne Torres, male    DOB: 08/04/1940, 75 y.o.   MRN: [id number]  HPI 75 yo male former smoker with IPF and O2 dependent  Hx of NSCLC lung cancer Spring 2016 s/p SBRT   06/21/2015 Follow up : IPF  Pt returns for 1 month follow up . Pt c/o increased SOB at times and prod cough with clear mucus. Feels he is at his baseline  Denies any sinus pressure/drainage, chest tightness/congestion, fever, nausea or vomtiting.   Pt is currently taking OFEV.  This was started last ov.  No n/v/d. No appetite changes.  He remains on oxygen at 3 L at rest and 4 L with walking.    Hx of NSCLC lung cancer Spring 2016 s/p SBRT  Followed at [facility] ,  CT Chest 06/11/2015 showed increase in left upper lobe nodule, similar groundglass opacities in left upper lobe. Felt likely to represent radiation changes. Unchanged right middle lobe lung nodule.    Past Medical History  Diagnosis Date  . GI bleed   . Shoulder joint pain   . Coronary artery disease s/p CABG 1990s     a. approx 1994 s/p CABG x 4 (LIMA->LAD, VG->RCA, VG->RI, VG->Diag);  b. 11/2008 Cath: LM 100, LAD small, RCA 50-60p, 100d, VG->RCA 100 ost, VG->RI 19m VG->Diag 40 - upper branch of diag small, 99%, LIMA-LAD patent.;  c. 08/2011 Echo: EF 45-50%, mod dil LA,/RA/RV with mild-mod reduced RV fxn.  . Back pain   . Hip pain, right   . PSA (psoriatic arthritis) ([place]     increased  . Lumbar disc disease   . Malignant neoplasm of prostate ([place]   . Fatigue   . Pulmonary fibrosis, postinflammatory (HCC)     Stopped Cellcept 02/13/13 d/t cost   . Osteoarthritis   . HTN (hypertension)   . Depression   . Anxiety   . ED (erectile dysfunction)   . Hyperlipidemia   . GERD (gastroesophageal reflux disease)   . Allergic rhinitis   . Peptic stricture of esophagus   . Complete heart block (HCC)     a. s/p SJM [facility] dc ppm, ser # [id number] . Chronic bronchitis ([place] 02/07/2012  . PVD (peripheral vascular disease)  ([place]     a. 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  bilat ext iliac dzs.  . Pneumonia 1993    post-op  . Allergic rhinitis, cause unspecified 02/28/2013  . Shortness of breath dyspnea   . Bipolar disorder ([place]   . History of kidney stones    Current Outpatient Prescriptions on File Prior to Visit  Medication Sig Dispense Refill  . acetaminophen (TYLENOL) 500 MG tablet Take 1,000 mg by mouth every 6 (six) hours as needed for moderate pain or headache.    . albuterol (PROVENTIL HFA;VENTOLIN HFA) 108 (90 BASE) MCG/ACT inhaler Inhale 2 puffs into the lungs every 6 (six) hours as needed for wheezing or shortness of breath. 1 Inhaler 11  . ALPRAZolam (XANAX) 0.25 MG tablet TAKE 1 TABLET BY MOUTH EVERY DAY AS NEEDED 30 tablet 2  . amLODipine (NORVASC) 5 MG tablet Take 5 mg by mouth daily. Take one-half tablet daily    . citalopram (CELEXA) 20 MG tablet TAKE 1 TABLET BY MOUTH EVERY DAY 90 tablet 3  . finasteride (PROSCAR) 5 MG tablet Take 5 mg by mouth daily.     . fluticasone (FLONASE) 50 MCG/ACT nasal spray Place 2  sprays into the nose daily. Reported on 05/10/2015    . lisinopril (PRINIVIL,ZESTRIL) 10 MG tablet Take 0.5 tablets (5 mg total) by mouth daily. 90 tablet 3  . Magnesium 500 MG TABS Take 1 tablet by mouth every morning.    . metoprolol succinate (TOPROL-XL) 25 MG 24 hr tablet TAKE 1/2 TABLET BY MOUTH TWICE DAILY 90 tablet 0  . Multiple Vitamins-Minerals (PRESERVISION AREDS PO) Take 1 tablet by mouth 2 (two) times daily.    . Nintedanib (OFEV) 150 MG CAPS Take 150 mg by mouth 2 (two) times daily. 60 capsule 0  . nitroGLYCERIN (NITROSTAT) 0.4 MG SL tablet Place 1 tablet (0.4 mg total) under the tongue every 5 (five) minutes as needed for chest pain. 90 tablet 3  . omeprazole (PRILOSEC) 40 MG capsule TAKE ONE CAPSULE BY MOUTH EVERY DAY 90 capsule 3  . OXYGEN Inhale into the lungs. 3 lpm qhs  4 lpm pulsed with exertion as needed DME:    .  simvastatin (ZOCOR) 40 MG tablet TAKE 1 TABLET BY MOUTH AT BEDTIME 90 tablet 2  . tamsulosin (FLOMAX) 0.4 MG CAPS capsule Take 0.4 mg by mouth every morning.   11   No current facility-administered medications on file prior to visit.     Review of Systems Constitutional:   No  weight loss, night sweats,  Fevers, chills,  +fatigue, or  lassitude.  HEENT:   No headaches,  Difficulty swallowing,  Tooth/dental problems, or  Sore throat,                No sneezing, itching, ear ache, nasal congestion, post nasal drip,   CV:  No chest pain,  Orthopnea, PND, swelling in lower extremities, anasarca, dizziness, palpitations, syncope.   GI  No heartburn, indigestion, abdominal pain, nausea, vomiting, diarrhea, change in bowel habits, loss of appetite, bloody stools.   Resp:   No chest wall deformity  Skin: no rash or lesions.  GU: no dysuria, change in color of urine, no urgency or frequency.  No flank pain, no hematuria   MS:  No joint pain or swelling.  No decreased range of motion.  No back pain.  Psych:  No change in mood or affect. No depression or anxiety.  No memory loss.         Objective:   Physical Exam   Filed Vitals:   06/21/15 1417  BP: 134/86  Pulse: 65  Temp: 97.6 F (36.4 C)  TempSrc: Oral  Height: 6' (1.829 m)  Weight: 231 lb (104.781 kg)  SpO2: 91%   GEN: A/Ox3; pleasant , NAD, elderly   HEENT:  [place]/AT,  EACs-clear, TMs-wnl, NOSE-clear, THROAT-clear, no lesions, no postnasal drip or exudate noted.   NECK:  Supple w/ fair ROM; no JVD; normal carotid impulses w/o bruits; no thyromegaly or nodules palpated; no lymphadenopathy.  RESP  Faint BB crackles .no accessory muscle use, no dullness to percussion  CARD:  RRR, no m/r/g  , no peripheral edema, pulses intact, no cyanosis or clubbing.  GI:   Soft & nt; nml bowel sounds; no organomegaly or masses detected.  Musco: Warm bil, no deformities or joint swelling noted.   Neuro: alert, no focal deficits  noted.    Skin: Warm, no lesions or rashes       Assessment & Plan:

## 2015-06-21 NOTE — Patient Instructions (Addendum)
Continue on Oxygen.  Continue on OFEV Twice daily   Labs today .  follow up Dr. Chase Caller in 3 months and As needed

## 2015-06-25 ENCOUNTER — Telehealth: Payer: Self-pay | Admitting: Adult Health

## 2015-06-25 NOTE — Progress Notes (Signed)
Quick Note:  LVM with patient's wife to have patient to return call. ______

## 2015-06-25 NOTE — Telephone Encounter (Signed)
Notes Recorded by Melvenia Needles, NP on 06/25/2015 at 9:28 AM LFT are nml  cotn w/ ov recs ------------ Spoke with pt, aware of results/recs.  Nothing further needed.

## 2015-06-27 ENCOUNTER — Telehealth: Payer: Self-pay | Admitting: Internal Medicine

## 2015-06-27 DIAGNOSIS — J9620 Acute and chronic respiratory failure, unspecified whether with hypoxia or hypercapnia: Secondary | ICD-10-CM | POA: Insufficient documentation

## 2015-06-27 NOTE — Telephone Encounter (Signed)
Attempted to call pt. No answer, will call back.

## 2015-06-27 NOTE — Assessment & Plan Note (Signed)
Compensated , seems to be tolerating OFEV without sigificant side effects.  Check LFT today .   paln  Continue on Oxygen.  Continue on OFEV Twice daily   Labs today .  follow up Dr. Chase Caller in 3 months and As needed

## 2015-06-27 NOTE — Assessment & Plan Note (Signed)
Compensated on O2  

## 2015-06-28 NOTE — Telephone Encounter (Signed)
Received VM stating "this is the Supp[facility]'s leave a message" LMTCB x1

## 2015-06-28 NOTE — Telephone Encounter (Signed)
Spoke with pt, states that he received a card from [facility] saying we needed to update his chart- pt states he does not use [facility] anymore for his 02, but uses [facility] now.  States that per [facility] has been billing him for 02.  We have no documentation in his chart that he is using [facility].    atc [facility] X3 to verify this at [phone], line went to fast busy signal. Wcb.

## 2015-06-28 NOTE — Telephone Encounter (Signed)
[phone], pt cb

## 2015-07-01 ENCOUNTER — Other Ambulatory Visit: Payer: Self-pay

## 2015-07-01 MED ORDER — CITALOPRAM HYDROBROMIDE 20 MG PO TABS: 20.0000 mg | ORAL_TABLET | Freq: Every day | ORAL | Status: AC

## 2015-07-01 NOTE — Telephone Encounter (Signed)
Spoke with Jeani Hawking at [facility], states that pt is getting 02 through them and does not have an outstanding balance with them. States that pt's account is up to date and [facility] does not need anything from our office to continue his care at this time. Spoke with Shelton Silvas at [facility], verified that pt no longer receives services through them as of 02/2015.  Shelton Silvas states that he was being billed two extra months for his 02, but that they are in contact with his insurance company to rectify this, and that nothing is needed from [place] or pt at this time.   lmtcb X1 for pt to make aware.

## 2015-07-02 NOTE — Telephone Encounter (Signed)
lmtcb x2 for pt. 

## 2015-07-02 NOTE — Telephone Encounter (Signed)
Patient Returned call [phone]

## 2015-07-02 NOTE — Telephone Encounter (Signed)
Spoke with pt, advised that his chart has been updated and this situation with [facility] is being handled by their billing dept.  Pt expressed understanding.  Nothing further needed.

## 2015-07-03 ENCOUNTER — Other Ambulatory Visit: Payer: Self-pay | Admitting: Internal Medicine

## 2015-07-03 LAB — CUP PACEART REMOTE DEVICE CHECK
Battery Remaining Longevity: 109 mo
Brady Statistic RA Percent Paced: 32 %
Date Time Interrogation Session: 20170125172456
Implantable Lead Implant Date: 20130422
Implantable Lead Location: 753859
Implantable Lead Location: 753860
Lead Channel Impedance Value: 440 Ohm
Lead Channel Pacing Threshold Amplitude: 1.25 V
Lead Channel Pacing Threshold Pulse Width: 0.4 ms
Lead Channel Sensing Intrinsic Amplitude: 4.7 mV
MDC IDC LEAD IMPLANT DT: 20130422
MDC IDC MSMT BATTERY REMAINING PERCENTAGE: 81 %
MDC IDC MSMT BATTERY VOLTAGE: 2.93 V
MDC IDC MSMT LEADCHNL RV IMPEDANCE VALUE: 510 Ohm
MDC IDC MSMT LEADCHNL RV PACING THRESHOLD AMPLITUDE: 0.75 V
MDC IDC MSMT LEADCHNL RV PACING THRESHOLD PULSEWIDTH: 0.4 ms
MDC IDC MSMT LEADCHNL RV SENSING INTR AMPL: 12 mV
MDC IDC SET LEADCHNL RA PACING AMPLITUDE: 2.25 V
MDC IDC SET LEADCHNL RV PACING AMPLITUDE: 1 V
MDC IDC SET LEADCHNL RV PACING PULSEWIDTH: 0.4 ms
MDC IDC SET LEADCHNL RV SENSING SENSITIVITY: 2 mV
MDC IDC STAT BRADY AP VP PERCENT: 38 %
MDC IDC STAT BRADY AP VS PERCENT: 1.6 %
MDC IDC STAT BRADY AS VP PERCENT: 48 %
MDC IDC STAT BRADY AS VS PERCENT: 4 %
MDC IDC STAT BRADY RV PERCENT PACED: 86 %
Pulse Gen Model: 2110
Pulse Gen Serial Number: 7327490

## 2015-07-05 ENCOUNTER — Encounter: Payer: Self-pay | Admitting: Cardiology

## 2015-07-12 ENCOUNTER — Other Ambulatory Visit: Payer: Self-pay | Admitting: Internal Medicine

## 2015-07-25 ENCOUNTER — Telehealth: Payer: Self-pay | Admitting: Adult Health

## 2015-07-25 NOTE — Telephone Encounter (Signed)
Spoke with pt, states he's sick since Saturday- states he went to urgent care for "stomach bug", was instructed to temporarily d/c Ofev until he feels better.  Pt just wanted to make our office aware.  Pt will contact us when he restarts Ofev.    Will forward to MR as FYI.  Please advise if anything further is needed.

## 2015-07-25 NOTE — Telephone Encounter (Signed)
Hold ofev for atleast a week or till he is fully better

## 2015-07-26 NOTE — Telephone Encounter (Signed)
Spoke with pt's wife. She is aware of MR's recommendation. Nothing further was needed.

## 2015-08-05 ENCOUNTER — Telehealth: Payer: Self-pay | Admitting: Internal Medicine

## 2015-08-05 NOTE — Telephone Encounter (Signed)
lmtcb X1 for pt's wife.  

## 2015-08-05 NOTE — Telephone Encounter (Signed)
Wife states that the patient is feeling better and has started taking the OFEV again.  Will call if any further GI issues once starting back.  Will send to Dr Chase Caller as Juluis Rainier.  Nothing further needed.

## 2015-08-06 NOTE — Telephone Encounter (Signed)
Thanks and noted

## 2015-09-12 ENCOUNTER — Encounter: Payer: Self-pay | Admitting: Internal Medicine

## 2015-09-12 ENCOUNTER — Ambulatory Visit (INDEPENDENT_AMBULATORY_CARE_PROVIDER_SITE_OTHER): Payer: Medicare Other | Admitting: Internal Medicine

## 2015-09-12 VITALS — BP 118/64 | HR 68 | Ht 72.0 in | Wt 220.0 lb

## 2015-09-12 DIAGNOSIS — I10 Essential (primary) hypertension: Secondary | ICD-10-CM

## 2015-09-12 NOTE — Progress Notes (Signed)
Cardiology Office Note   Date:  09/12/2015   ID:  Wayne Torres, DOB [birth year], MRN [id number]  PCP:  Cathlean Cower, MD  Cardiologist:   Dorris Carnes, MD   F/U of CAD    History of Present Illness: Wayne Torres is a 75 y.o. male with a history ofCAD S/P CABG And chronic systolic CHF  I saw him in clinic in December Seen by [facility] in Jan  Also followed at [facility] for lung CA  Fell  Hit chest  No fx  Had extensive  Bruising    Denies chest pain  Wears O2 when doing thngs          Outpatient Prescriptions Prior to Visit  Medication Sig Dispense Refill  . acetaminophen (TYLENOL) 500 MG tablet Take 1,000 mg by mouth every 6 (six) hours as needed for moderate pain or headache.    . albuterol (PROVENTIL HFA;VENTOLIN HFA) 108 (90 BASE) MCG/ACT inhaler Inhale 2 puffs into the lungs every 6 (six) hours as needed for wheezing or shortness of breath. 1 Inhaler 11  . ALPRAZolam (XANAX) 0.25 MG tablet TAKE 1 TABLET BY MOUTH EVERY DAY AS NEEDED 30 tablet 2  . amLODipine (NORVASC) 5 MG tablet Take 5 mg by mouth daily. Take one-half tablet daily    . amLODipine (NORVASC) 5 MG tablet TAKE 1/2 TABLET BY MOUTH DAILY 90 tablet 0  . citalopram (CELEXA) 20 MG tablet Take 1 tablet (20 mg total) by mouth daily. 90 tablet 3  . finasteride (PROSCAR) 5 MG tablet Take 5 mg by mouth daily.     . fluticasone (FLONASE) 50 MCG/ACT nasal spray Place 2 sprays into the nose daily. Reported on 05/10/2015    . lisinopril (PRINIVIL,ZESTRIL) 10 MG tablet Take 0.5 tablets (5 mg total) by mouth daily. 90 tablet 3  . Magnesium 500 MG TABS Take 1 tablet by mouth every morning.    . metoprolol succinate (TOPROL-XL) 25 MG 24 hr tablet TAKE 1/2 TABLET BY MOUTH TWICE DAILY 90 tablet 0  . Multiple Vitamins-Minerals (PRESERVISION AREDS PO) Take 1 tablet by mouth 2 (two) times daily.    . Nintedanib (OFEV) 150 MG CAPS Take 150 mg by mouth 2 (two) times daily. 60 capsule 0  . nitroGLYCERIN (NITROSTAT) 0.4 MG SL tablet Place  1 tablet (0.4 mg total) under the tongue every 5 (five) minutes as needed for chest pain. 90 tablet 3  . omeprazole (PRILOSEC) 40 MG capsule TAKE ONE CAPSULE BY MOUTH EVERY DAY 90 capsule 3  . OXYGEN Inhale into the lungs. 3 lpm qhs  4 lpm pulsed with exertion as needed DME:    . simvastatin (ZOCOR) 40 MG tablet TAKE 1 TABLET BY MOUTH AT BEDTIME 90 tablet 2  . tamsulosin (FLOMAX) 0.4 MG CAPS capsule Take 0.4 mg by mouth every morning.   11  . simvastatin (ZOCOR) 40 MG tablet TAKE 1 TABLET BY MOUTH AT BEDTIME 90 tablet 0   No facility-administered medications prior to visit.     Allergies:   Pirfenidone   Past Medical History  Diagnosis Date  . GI bleed   . Shoulder joint pain   . Coronary artery disease s/p CABG 1990s     a. approx 1994 s/p CABG x 4 (LIMA->LAD, VG->RCA, VG->RI, VG->Diag);  b. 11/2008 Cath: LM 100, LAD small, RCA 50-60p, 100d, VG->RCA 100 ost, VG->RI 29m VG->Diag 40 - upper branch of diag small, 99%, LIMA-LAD patent.;  c. 08/2011 Echo: EF 45-50%, mod dil LA,/RA/RV with  mild-mod reduced RV fxn.  . Back pain   . Hip pain, right   . PSA (psoriatic arthritis) ([place])     increased  . Lumbar disc disease   . Malignant neoplasm of prostate ([place])   . Fatigue   . Pulmonary fibrosis, postinflammatory (HCC)     Stopped Cellcept 02/13/13 d/t cost   . Osteoarthritis   . HTN (hypertension)   . Depression   . Anxiety   . ED (erectile dysfunction)   . Hyperlipidemia   . GERD (gastroesophageal reflux disease)   . Allergic rhinitis   . Peptic stricture of esophagus   . Complete heart block (HCC)     a. s/p SJM [facility] dc ppm, ser # [id number]  . Chronic bronchitis ([place]) 02/07/2012  . PVD (peripheral vascular disease) ([place])     a. 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  bilat ext iliac dzs.  . Pneumonia 1993    post-op  . Allergic rhinitis, cause unspecified 02/28/2013  . Shortness of breath dyspnea   . Bipolar disorder ([place])   . History of  kidney stones     Past Surgical History  Procedure Laterality Date  . Coronary angioplasty  06/03/2007, 01/25/2009  . Nasal septum surgery  april 2012    dr britt, [facility] [place]  . Coronary artery bypass graft  20 yrs ago    LIMA to LAD, SVG to PDA, SVG to Ramus, SVG to D  . Pacemaker placement  08/2011  . Permanent pacemaker insertion N/A 09/14/2011    Procedure: PERMANENT PACEMAKER INSERTION;  Surgeon: Deboraha Sprang, MD;  Location: [facility] CATH LAB;  Service: Cardiovascular;  Laterality: N/A;  . Left and right heart catheterization with coronary angiogram N/A 07/05/2012    Procedure: LEFT AND RIGHT HEART CATHETERIZATION WITH CORONARY ANGIOGRAM;  Surgeon: Peter M Martinique, MD;  Location: [facility] CATH LAB;  Service: Cardiovascular;  Laterality: N/A;  . Cystoscopy    . Video bronchoscopy with endobronchial ultrasound Left 09/05/2014    Procedure: VIDEO BRONCHOSCOPY WITH ENDOBRONCHIAL ULTRASOUND;  Surgeon: Collene Gobble, MD;  Location: [place];  Service: Thoracic;  Laterality: Left;     Social History:  The patient  reports that he quit smoking about 14 years ago. His smoking use included Cigarettes. He has a 63 pack-year smoking history. He has never used smokeless tobacco. He reports that he does not drink alcohol or use illicit drugs.   Family History:  The patient's family history includes Heart attack in his father; Heart disease in his father. There is no history of Colon cancer or Cancer.    ROS:  Please see the history of present illness. All other systems are reviewed and  Negative to the above problem except as noted.    PHYSICAL EXAM: VS:  BP 118/64 mmHg  Pulse 68  Ht 6' (1.829 m)  Wt 220 lb (99.791 kg)  BMI 29.83 kg/m2  GEN: Well nourished, well developed, in no acute distress HEENT: normal Neck: no JVD, carotid bruits, or masses Cardiac: RRR; no murmurs, rubs, or gallops,no edema  Respiratory:  clear to auscultation bilaterally, normal work of breathing GI: soft, nontender,  nondistended, + BS  No hepatomegaly  MS: no deformity Moving all extremities   Skin: warm and dry, no rash Neuro:  Strength and sensation are intact Psych: euthymic mood, full affect   EKG:  EKG is ordered today.  SR 68 bpm  LBBB.  Occaional PVC  Lipid Panel    Component Value Date/Time   CHOL 88 02/08/2015 1211   TRIG 137.0 02/08/2015 1211   HDL 27.90* 02/08/2015 1211   CHOLHDL 3 02/08/2015 1211   VLDL 27.4 02/08/2015 1211   LDLCALC 33 02/08/2015 1211   LDLDIRECT 49.4 05/01/2014 1717      Wt Readings from Last 3 Encounters:  09/12/15 220 lb (99.791 kg)  06/21/15 231 lb (104.781 kg)  05/10/15 224 lb (101.606 kg)      ASSESSMENT AND PLAN:  1  CAD  No symptoms of angina.  2.  Chronic systolic CHF  Volume status is OK    3.  PUlm  Continues to follow in pulm clinic [place] [facility]       Signed, Dorris Carnes, MD  09/12/2015 3:34 PM    [address], [place], [place]  [address] Phone: [phone]; Fax: [phone]

## 2015-09-12 NOTE — Patient Instructions (Signed)
Your physician recommends that you continue on your current medications as directed. Please refer to the Current Medication list given to you today.

## 2015-09-18 ENCOUNTER — Ambulatory Visit (INDEPENDENT_AMBULATORY_CARE_PROVIDER_SITE_OTHER): Payer: Medicare Other | Admitting: *Deleted

## 2015-09-18 DIAGNOSIS — I442 Atrioventricular block, complete: Secondary | ICD-10-CM | POA: Diagnosis not present

## 2015-09-18 NOTE — Progress Notes (Signed)
Remote pacemaker transmission.

## 2015-09-24 ENCOUNTER — Ambulatory Visit (INDEPENDENT_AMBULATORY_CARE_PROVIDER_SITE_OTHER): Payer: Medicare Other | Admitting: Internal Medicine

## 2015-09-24 ENCOUNTER — Other Ambulatory Visit (INDEPENDENT_AMBULATORY_CARE_PROVIDER_SITE_OTHER): Payer: Medicare Other

## 2015-09-24 ENCOUNTER — Encounter: Payer: Self-pay | Admitting: Internal Medicine

## 2015-09-24 VITALS — BP 128/84 | HR 61 | Ht 72.0 in | Wt 220.4 lb

## 2015-09-24 DIAGNOSIS — J84112 Idiopathic pulmonary fibrosis: Secondary | ICD-10-CM

## 2015-09-24 DIAGNOSIS — J9611 Chronic respiratory failure with hypoxia: Secondary | ICD-10-CM

## 2015-09-24 LAB — HEPATIC FUNCTION PANEL
ALK PHOS: 66 U/L (ref 39–117)
ALT: 12 U/L (ref 0–53)
AST: 16 U/L (ref 0–37)
Albumin: 4 g/dL (ref 3.5–5.2)
BILIRUBIN DIRECT: 0.2 mg/dL (ref 0.0–0.3)
BILIRUBIN TOTAL: 0.9 mg/dL (ref 0.2–1.2)
TOTAL PROTEIN: 7.1 g/dL (ref 6.0–8.3)

## 2015-09-24 NOTE — Patient Instructions (Addendum)
ICD-9-CM ICD-10-CM   1. IPF (idiopathic pulmonary fibrosis) (HCC) 516.31 J84.112   2. Chronic respiratory failure with hypoxia (HCC) 518.83 J96.11    799.02     IPF stable. Unclear cause of proteinuria - do no think is related to Ofev  Plan - check lft 09/24/2015 -will refer PFF support group - rfer pulmonary rehab - [facility] or [place] per your choice - will refer research protocols if we have any  Followup - ROV 3 months or sooner if needed  - LFT check at followup

## 2015-09-24 NOTE — Progress Notes (Signed)
Subjective:     Patient ID: [facility] Torres, male   DOB: 08-08-1940, 75 y.o.   MRN: [id number]  HPI    02/06/2015 Chief Complaint  Patient presents with  . Follow-up    pt states he is doing well, his breathing is getting better. pt on O2 with activity and at night.  pulse is set at 4LM with activity. at night continuous on 3LPM. DME: [facility].     Pt was doing well until one week ago, felt poorly.  Noted constant belching. Symptoms better off esbriet .  Pt had SBRT to lung ca and f/u CT was stable.  Min cough . No hemoptysis  Pt denies any significant sore throat, nasal congestion or excess secretions, fever, chills, sweats, unintended weight loss, pleurtic or exertional chest pain, orthopnea PND, or leg swelling Pt denies any increase in rescue therapy over baseline, denies waking up needing it or having any early am or nocturnal exacerbations of coughing/wheezing/or dyspnea. Pt also denies any obvious fluctuation in symptoms with  weather or environmental change or other alleviating or aggravating factors    OV 05/10/2015  Chief Complaint  Patient presents with  . Follow-up    Former PW pt, seen for IPF. Pt states his breathing is unchanged since OV in Sept. Pt denies cough and CP/tightness.    Former patient of Dr. Asencion Noble has since retired from the practice. Has clinical diagnosis of idiopathic pulmonary fibrosis. In the spring 2016 was diagnosed with non-small cell lung cancer treated with local radiation at [facility] has since then had follow-up scans there. He is on several liters of oxygen. He is here with his wife. He had tried Esbriet in the past approximately spring summer 2016 but had significant GI intolerance. He recollects having had all kindsr intolerance with it. He does not want to try it again. He is interested in trying nintedanib. He does have coronary artery disease but it is not active and is not on anticoagulationwife is also reporting some fatigue  for the last few weeks to several weeks. Reports that [facility] wanted him to have CBC, chemistry, liver function test and some kind of her tumor marker. I reviewed the [facility] chart but could not find any documentation of what tumor marker they might want    06/21/2015 Follow up : IPF  Pt returns for 1 month follow up . Pt c/o increased SOB at times and prod cough with clear mucus. Feels he is at his baseline  Denies any sinus pressure/drainage, chest tightness/congestion, fever, nausea or vomtiting.   Pt is currently taking OFEV.  This was started last ov.  No n/v/d. No appetite changes.  He remains on oxygen at 3 L at rest and 4 L with walking.    Hx of NSCLC lung cancer Spring 2016 s/p SBRT  Followed at [facility] ,  CT Chest 06/11/2015 showed increase in left upper lobe nodule, similar groundglass opacities in left upper lobe. Felt likely to represent radiation changes. Unchanged right middle lobe lung nodule.    OV 09/24/2015  Chief Complaint  Patient presents with  . 3 month follow up    No changes to breathing.  Very little cough.  No chest tightness, CP, wheezing.  Pt reports he's recently had proteinuria and was advised it may be coming from Ofev.  Pt has appt with urology tomorrow for further assessment.   75 year old male with idiopathic pulmonary fibrosis and chronic respiratory failure. He is  on nintedanib since December 2016. This is a routine follow-up. According to him and his wife overall he's stable. They feel that nintedanib helped his energy levels even though they have been counseled that it does not. He continues to use 4 L oxygen. He has never attended pulmonary rehabilitation. He is tolerating his nintedanib well. His last liver function test was in January 2017 and it was normal. Then in the interim in March 2017 had "stomach bug". He held his nintedanib for a week but now he's taking it and he is feeling well. Of note he has not attended  pulmonary fibrosis foundation support group or pulmonary rehabilitation. He is interested in research protocols.  Only new issues that he has proteinuria and is wondering if this is related to nintedanib. He is due to see a urologist soon. In terms of his lung cancer he says that he had a visit at [facility] several weeks ago and is under remission.    has a past medical history of GI bleed; Shoulder joint pain; Coronary artery disease s/p CABG 1990s; Back pain; Hip pain, right; PSA (psoriatic arthritis) ([place]); Lumbar disc disease; Malignant neoplasm of prostate ([place]); Fatigue; Pulmonary fibrosis, postinflammatory ([place]); Osteoarthritis; HTN (hypertension); Depression; Anxiety; ED (erectile dysfunction); Hyperlipidemia; GERD (gastroesophageal reflux disease); Allergic rhinitis; Peptic stricture of esophagus; Complete heart block ([place]); Chronic bronchitis ([place]) (02/07/2012); PVD (peripheral vascular disease) ([place]); Pneumonia (1993); Allergic rhinitis, cause unspecified (02/28/2013); Shortness of breath dyspnea; Bipolar disorder ([place]); and History of kidney stones.   reports that he quit smoking about 14 years ago. His smoking use included Cigarettes. He has a 63 pack-year smoking history. He has never used smokeless tobacco.  Past Surgical History  Procedure Laterality Date  . Coronary angioplasty  06/03/2007, 01/25/2009  . Nasal septum surgery  april 2012    dr britt, [facility] [place]  . Coronary artery bypass graft  20 yrs ago    LIMA to LAD, SVG to PDA, SVG to Ramus, SVG to D  . Pacemaker placement  08/2011  . Permanent pacemaker insertion N/A 09/14/2011    Procedure: PERMANENT PACEMAKER INSERTION;  Surgeon: Deboraha Sprang, MD;  Location: [facility] CATH LAB;  Service: Cardiovascular;  Laterality: N/A;  . Left and right heart catheterization with coronary angiogram N/A 07/05/2012    Procedure: LEFT AND RIGHT HEART CATHETERIZATION WITH CORONARY ANGIOGRAM;  Surgeon: Peter M Martinique, MD;  Location: [facility] CATH LAB;   Service: Cardiovascular;  Laterality: N/A;  . Cystoscopy    . Video bronchoscopy with endobronchial ultrasound Left 09/05/2014    Procedure: VIDEO BRONCHOSCOPY WITH ENDOBRONCHIAL ULTRASOUND;  Surgeon: Collene Gobble, MD;  Location: [place];  Service: Thoracic;  Laterality: Left;    Allergies  Allergen Reactions  . Pirfenidone Nausea And Vomiting    Immunization History  Administered Date(s) Administered  . Influenza Split 03/20/2011, 02/23/2012  . Influenza Whole 02/23/2000, 05/04/2007, 03/11/2010  . Influenza,inj,Quad PF,36+ Mos 02/13/2013, 04/02/2014, 02/06/2015  . Pneumococcal Conjugate-13 05/01/2014  . Pneumococcal Polysaccharide-23 08/26/2007, 02/13/2013  . Td 03/11/2010    Family History  Problem Relation Age of Onset  . Heart attack Father   . Heart disease Father   . Colon cancer Neg Hx   . Cancer Neg Hx     Colon     Current outpatient prescriptions:  .  acetaminophen (TYLENOL) 500 MG tablet, Take 1,000 mg by mouth every 6 (six) hours as needed for moderate pain or headache., Disp: , Rfl:  .  albuterol (PROVENTIL HFA;VENTOLIN  HFA) 108 (90 BASE) MCG/ACT inhaler, Inhale 2 puffs into the lungs every 6 (six) hours as needed for wheezing or shortness of breath., Disp: 1 Inhaler, Rfl: 11 .  ALPRAZolam (XANAX) 0.25 MG tablet, TAKE 1 TABLET BY MOUTH EVERY DAY AS NEEDED, Disp: 30 tablet, Rfl: 2 .  amLODipine (NORVASC) 5 MG tablet, TAKE 1/2 TABLET BY MOUTH DAILY, Disp: 90 tablet, Rfl: 0 .  citalopram (CELEXA) 20 MG tablet, Take 1 tablet (20 mg total) by mouth daily., Disp: 90 tablet, Rfl: 3 .  finasteride (PROSCAR) 5 MG tablet, Take 5 mg by mouth daily. , Disp: , Rfl:  .  fluticasone (FLONASE) 50 MCG/ACT nasal spray, Place 2 sprays into the nose daily. Reported on 05/10/2015, Disp: , Rfl:  .  lisinopril (PRINIVIL,ZESTRIL) 10 MG tablet, Take 0.5 tablets (5 mg total) by mouth daily., Disp: 90 tablet, Rfl: 3 .  Magnesium 500 MG TABS, Take 1 tablet by mouth every morning., Disp: ,  Rfl:  .  metoprolol succinate (TOPROL-XL) 25 MG 24 hr tablet, TAKE 1/2 TABLET BY MOUTH TWICE DAILY, Disp: 90 tablet, Rfl: 0 .  Multiple Vitamins-Minerals (PRESERVISION AREDS PO), Take 1 tablet by mouth 2 (two) times daily., Disp: , Rfl:  .  Nintedanib (OFEV) 150 MG CAPS, Take 150 mg by mouth 2 (two) times daily., Disp: 60 capsule, Rfl: 0 .  nitroGLYCERIN (NITROSTAT) 0.4 MG SL tablet, Place 1 tablet (0.4 mg total) under the tongue every 5 (five) minutes as needed for chest pain., Disp: 90 tablet, Rfl: 3 .  omeprazole (PRILOSEC) 40 MG capsule, TAKE ONE CAPSULE BY MOUTH EVERY DAY, Disp: 90 capsule, Rfl: 3 .  OXYGEN, Inhale into the lungs. 3 lpm qhs  4 lpm pulsed with exertion as needed DME:, Disp: , Rfl:  .  simvastatin (ZOCOR) 40 MG tablet, TAKE 1 TABLET BY MOUTH AT BEDTIME, Disp: 90 tablet, Rfl: 2 .  tamsulosin (FLOMAX) 0.4 MG CAPS capsule, Take 0.4 mg by mouth every morning. , Disp: , Rfl: 11    Review of Systems     Objective:   Physical Exam  Constitutional: He is oriented to person, place, and time. He appears well-developed and well-nourished. No distress.  HENT:  Head: Normocephalic and atraumatic.  Right Ear: External ear normal.  Left Ear: External ear normal.  Mouth/Throat: Oropharynx is clear and moist. No oropharyngeal exudate.  On oxygen  Eyes: Conjunctivae and EOM are normal. Pupils are equal, round, and reactive to light. Right eye exhibits no discharge. Left eye exhibits no discharge. No scleral icterus.  Neck: Normal range of motion. Neck supple. No JVD present. No tracheal deviation present. No thyromegaly present.  Cardiovascular: Normal rate, regular rhythm and intact distal pulses.  Exam reveals no gallop and no friction rub.   No murmur heard. Pulmonary/Chest: Effort normal. No respiratory distress. He has no wheezes. He has rales. He exhibits no tenderness.  Abdominal: Soft. Bowel sounds are normal. He exhibits no distension and no mass. There is no tenderness. There  is no rebound and no guarding.  Musculoskeletal: Normal range of motion. He exhibits no edema or tenderness.  Lymphadenopathy:    He has no cervical adenopathy.  Neurological: He is alert and oriented to person, place, and time. He has normal reflexes. No cranial nerve deficit. Coordination normal.  Skin: Skin is warm and dry. No rash noted. He is not diaphoretic. No erythema. No pallor.  Psychiatric: He has a normal mood and affect. His behavior is normal. Judgment and thought content normal.  Nursing note and vitals reviewed.  Filed Vitals:   09/24/15 1404  BP: 128/84  Pulse: 61  Height: 6' (1.829 m)  Weight: 220 lb 6.4 oz (99.973 kg)  SpO2: 90%   On 4L [place] At rest      Assessment:       ICD-9-CM ICD-10-CM   1. IPF (idiopathic pulmonary fibrosis) (HCC) 516.31 J84.112 Hepatic function panel     AMB referral to pulmonary rehabilitation  2. Chronic respiratory failure with hypoxia (HCC) 518.83 J96.11    799.02         Plan:      IPF stable. Unclear cause of proteinuria - do no think is related to Ofev  Plan - check lft 09/24/2015 -will refer PFF support group - rfer pulmonary rehab - [facility] or [facility] per your choice - will refer research protocols if we have any  Followup - ROV 3 months or sooner if needed  - LFT check at followup   > 50% of this > 25 min visit spent in face to face counseling or coordination of care    Dr. Brand Males, M.D., [facility].C.P Pulmonary and Critical Care Medicine Staff Physician [place] [facility] Pager: [phone], If no answer or between  15:00h - 7:00h: call [phone]  09/24/2015 2:35 PM

## 2015-09-25 ENCOUNTER — Telehealth: Payer: Self-pay | Admitting: Internal Medicine

## 2015-09-25 NOTE — Telephone Encounter (Signed)
Spoke with pt and advised of lab results per Dr Chase Caller

## 2015-09-25 NOTE — Progress Notes (Signed)
Quick Note:  lmtcb for pt. ______ 

## 2015-09-30 ENCOUNTER — Other Ambulatory Visit: Payer: Self-pay | Admitting: Internal Medicine

## 2015-10-30 ENCOUNTER — Encounter: Payer: Self-pay | Admitting: Cardiology

## 2015-11-01 LAB — CUP PACEART REMOTE DEVICE CHECK
Brady Statistic AP VP Percent: 34 %
Brady Statistic AS VP Percent: 53 %
Implantable Lead Implant Date: 20130422
Implantable Lead Location: 753859
Lead Channel Pacing Threshold Pulse Width: 0.4 ms
Lead Channel Pacing Threshold Pulse Width: 0.4 ms
Lead Channel Sensing Intrinsic Amplitude: 12 mV
Lead Channel Setting Pacing Amplitude: 1.125
Lead Channel Setting Pacing Amplitude: 1.625
MDC IDC LEAD IMPLANT DT: 20130422
MDC IDC LEAD LOCATION: 753860
MDC IDC MSMT BATTERY REMAINING LONGEVITY: 109 mo
MDC IDC MSMT BATTERY REMAINING PERCENTAGE: 81 %
MDC IDC MSMT BATTERY VOLTAGE: 2.93 V
MDC IDC MSMT LEADCHNL RA IMPEDANCE VALUE: 450 Ohm
MDC IDC MSMT LEADCHNL RA PACING THRESHOLD AMPLITUDE: 0.625 V
MDC IDC MSMT LEADCHNL RA SENSING INTR AMPL: 5 mV
MDC IDC MSMT LEADCHNL RV IMPEDANCE VALUE: 510 Ohm
MDC IDC MSMT LEADCHNL RV PACING THRESHOLD AMPLITUDE: 0.875 V
MDC IDC SESS DTM: 20170426142648
MDC IDC SET LEADCHNL RV PACING PULSEWIDTH: 0.4 ms
MDC IDC SET LEADCHNL RV SENSING SENSITIVITY: 2 mV
MDC IDC STAT BRADY AP VS PERCENT: 1.3 %
MDC IDC STAT BRADY AS VS PERCENT: 4.8 %
MDC IDC STAT BRADY RA PERCENT PACED: 28 %
MDC IDC STAT BRADY RV PERCENT PACED: 86 %
Pulse Gen Model: 2110
Pulse Gen Serial Number: 7327490

## 2015-11-15 ENCOUNTER — Encounter: Payer: Self-pay | Admitting: Internal Medicine

## 2015-11-18 ENCOUNTER — Encounter: Payer: Self-pay | Admitting: Internal Medicine

## 2015-11-21 ENCOUNTER — Telehealth: Payer: Self-pay | Admitting: *Deleted

## 2015-11-21 DIAGNOSIS — R001 Bradycardia, unspecified: Secondary | ICD-10-CM

## 2015-11-21 DIAGNOSIS — I499 Cardiac arrhythmia, unspecified: Secondary | ICD-10-CM

## 2015-11-21 NOTE — Telephone Encounter (Signed)
Per message sent by Dr. Harrington Challenger pertaining to patient's email sent on 11/15/15, Dr. Harrington Challenger would like patient scheduled for 48 hr holter monitor and on that same day have labs (BMET and MG).  Called and informed patient's wife who verbalizes understanding and appreciation for the call.  She is aware someone will be calling to schedule.

## 2015-12-02 ENCOUNTER — Other Ambulatory Visit (INDEPENDENT_AMBULATORY_CARE_PROVIDER_SITE_OTHER): Payer: Medicare Other | Admitting: *Deleted

## 2015-12-02 ENCOUNTER — Ambulatory Visit (INDEPENDENT_AMBULATORY_CARE_PROVIDER_SITE_OTHER): Payer: Medicare Other

## 2015-12-02 DIAGNOSIS — R001 Bradycardia, unspecified: Secondary | ICD-10-CM | POA: Diagnosis not present

## 2015-12-02 DIAGNOSIS — I499 Cardiac arrhythmia, unspecified: Secondary | ICD-10-CM | POA: Diagnosis not present

## 2015-12-02 LAB — BASIC METABOLIC PANEL
BUN: 18 mg/dL (ref 7–25)
CALCIUM: 9.1 mg/dL (ref 8.6–10.3)
CO2: 26 mmol/L (ref 20–31)
Chloride: 101 mmol/L (ref 98–110)
Creat: 1.3 mg/dL — ABNORMAL HIGH (ref 0.70–1.18)
GLUCOSE: 102 mg/dL — AB (ref 65–99)
Potassium: 4.3 mmol/L (ref 3.5–5.3)
SODIUM: 138 mmol/L (ref 135–146)

## 2015-12-02 LAB — MAGNESIUM: Magnesium: 1.5 mg/dL (ref 1.5–2.5)

## 2015-12-06 ENCOUNTER — Other Ambulatory Visit: Payer: Self-pay | Admitting: *Deleted

## 2015-12-06 DIAGNOSIS — I10 Essential (primary) hypertension: Secondary | ICD-10-CM

## 2015-12-18 ENCOUNTER — Ambulatory Visit (INDEPENDENT_AMBULATORY_CARE_PROVIDER_SITE_OTHER): Payer: Medicare Other | Admitting: *Deleted

## 2015-12-18 ENCOUNTER — Telehealth: Payer: Self-pay | Admitting: Cardiology

## 2015-12-18 DIAGNOSIS — I442 Atrioventricular block, complete: Secondary | ICD-10-CM | POA: Diagnosis not present

## 2015-12-18 NOTE — Telephone Encounter (Signed)
Confirmed remote transmission w/ pt wife.   

## 2015-12-19 NOTE — Progress Notes (Signed)
Remote pacemaker transmission.   

## 2015-12-20 ENCOUNTER — Encounter: Payer: Self-pay | Admitting: Cardiology

## 2015-12-24 LAB — CUP PACEART REMOTE DEVICE CHECK
Brady Statistic RA Percent Paced: 31 %
Brady Statistic RV Percent Paced: 87 %
Implantable Lead Implant Date: 20130422
Implantable Lead Location: 753859
Lead Channel Impedance Value: 430 Ohm
Lead Channel Pacing Threshold Amplitude: 0.5 V
Lead Channel Pacing Threshold Pulse Width: 0.4 ms
Lead Channel Pacing Threshold Pulse Width: 0.4 ms
Lead Channel Setting Pacing Amplitude: 1.125
MDC IDC LEAD IMPLANT DT: 20130422
MDC IDC LEAD LOCATION: 753860
MDC IDC MSMT LEADCHNL RA SENSING INTR AMPL: 4.9 mV
MDC IDC MSMT LEADCHNL RV IMPEDANCE VALUE: 510 Ohm
MDC IDC MSMT LEADCHNL RV PACING THRESHOLD AMPLITUDE: 0.625 V
MDC IDC MSMT LEADCHNL RV SENSING INTR AMPL: 12 mV
MDC IDC SESS DTM: 20170801164514
MDC IDC SET LEADCHNL RA PACING AMPLITUDE: 1.625
MDC IDC SET LEADCHNL RV PACING PULSEWIDTH: 0.4 ms
MDC IDC SET LEADCHNL RV SENSING SENSITIVITY: 2 mV
Pulse Gen Serial Number: 7327490

## 2015-12-25 ENCOUNTER — Telehealth: Payer: Self-pay | Admitting: Internal Medicine

## 2015-12-25 NOTE — Telephone Encounter (Signed)
Called and spoke with pt and he stated that he has been on the ofev 2 per day.  He stated that he had to cut back to 1 per day due to severe diarrhea.  He stated that he is still having to take immodium with the 1 tablet daily.  He wanted to call and find out what MR wanted him to do.  MR please advise. thanks

## 2015-12-27 NOTE — Telephone Encounter (Signed)
LMTCB

## 2015-12-27 NOTE — Telephone Encounter (Signed)
When did he cut to 1 tablet? And is the diarrhea tolerable at 1 tablet with immodium or is it messing his quality of life? Is he in toch with Ofev support tjhrough the company?

## 2015-12-27 NOTE — Telephone Encounter (Signed)
Ok then continyue immodium like he does with ofev 1 tab/day. When I see him 01/07/16 I can dsicuss maintenance lower dose ofev of 168m bid. He cannot take esbriet due to side effect. He will need 3 mo LFT at fu  Dr. MBrand Males M.D., FSt. Luke'S Rehabilitation InstituteC.P Pulmonary and Critical Care Medicine Staff Physician CEmanuelPulmonary and Critical Care Pager: [phone] If no answer or between  15:00h - 7:00h: call [phone]  12/27/2015 3:53 PM

## 2015-12-27 NOTE — Telephone Encounter (Signed)
Spoke with the pt and notified of recs per MR  He verbalized understanding and nothing further needed

## 2015-12-27 NOTE — Telephone Encounter (Signed)
Spoke with the pt  He states he cut back to 1 tablet approx 2 wks ago  If he takes the immodium 2 x per wk he does not have any problem with diarrhea  OFEV support has been in contact with him

## 2016-01-07 ENCOUNTER — Encounter: Payer: Self-pay | Admitting: Internal Medicine

## 2016-01-07 ENCOUNTER — Encounter: Payer: Self-pay | Admitting: Cardiology

## 2016-01-07 ENCOUNTER — Other Ambulatory Visit (INDEPENDENT_AMBULATORY_CARE_PROVIDER_SITE_OTHER): Payer: Medicare Other

## 2016-01-07 ENCOUNTER — Ambulatory Visit (INDEPENDENT_AMBULATORY_CARE_PROVIDER_SITE_OTHER): Payer: Medicare Other | Admitting: Internal Medicine

## 2016-01-07 VITALS — BP 100/62 | HR 55 | Ht 72.0 in | Wt 215.0 lb

## 2016-01-07 DIAGNOSIS — J84112 Idiopathic pulmonary fibrosis: Secondary | ICD-10-CM

## 2016-01-07 DIAGNOSIS — J9611 Chronic respiratory failure with hypoxia: Secondary | ICD-10-CM

## 2016-01-07 DIAGNOSIS — Z5181 Encounter for therapeutic drug level monitoring: Secondary | ICD-10-CM

## 2016-01-07 LAB — HEPATIC FUNCTION PANEL
ALT: 13 U/L (ref 0–53)
AST: 15 U/L (ref 0–37)
Albumin: 4.1 g/dL (ref 3.5–5.2)
Alkaline Phosphatase: 66 U/L (ref 39–117)
BILIRUBIN DIRECT: 0.2 mg/dL (ref 0.0–0.3)
TOTAL PROTEIN: 7.3 g/dL (ref 6.0–8.3)
Total Bilirubin: 0.8 mg/dL (ref 0.2–1.2)

## 2016-01-07 NOTE — Patient Instructions (Addendum)
ICD-9-CM ICD-10-CM   1. IPF (idiopathic pulmonary fibrosis) (HCC) 516.31 J84.112   2. Chronic respiratory failure with hypoxia (HCC) 518.83 J96.11    799.02    3. Encounter for therapeutic drug monitoring V58.83 Z51.81     IPF stable clnically  Plan - check 01/07/2016 -continue o2 as beofre - continue ofev 1 tablet x 130m a day due to diarrhea; you could consider the dose of 1038mtwice a day which is slightly higher but at this level is efficacious - be in touch with PFF foundatipn - will let you know of research protocols if you have any - PFT in 3 month  Followup - ROV 3 months or sooner if needed  - LFT and PFT check at followup

## 2016-01-07 NOTE — Progress Notes (Signed)
Subjective:     Patient ID: Wayne Torres, male   DOB: 11/23/1940, 75 y.o.   MRN: [id number] PCP [facility]  HPI    02/06/2015 Chief Complaint  Patient presents with  . Follow-up    pt states he is doing well, his breathing is getting better. pt on O2 with activity and at night.  pulse is set at 4LM with activity. at night continuous on 3LPM. DME: [facility].     Pt was doing well until one week ago, felt poorly.  Noted constant belching. Symptoms better off esbriet .  Pt had SBRT to lung ca and f/u CT was stable.  Min cough . No hemoptysis  Pt denies any significant sore throat, nasal congestion or excess secretions, fever, chills, sweats, unintended weight loss, pleurtic or exertional chest pain, orthopnea PND, or leg swelling Pt denies any increase in rescue therapy over baseline, denies waking up needing it or having any early am or nocturnal exacerbations of coughing/wheezing/or dyspnea. Pt also denies any obvious fluctuation in symptoms with  weather or environmental change or other alleviating or aggravating factors    OV 05/10/2015  Chief Complaint  Patient presents with  . Follow-up    Former PW pt, seen for IPF. Pt states his breathing is unchanged since OV in Sept. Pt denies cough and CP/tightness.    Former patient of Dr. Asencion Noble has since retired from the practice. Has clinical diagnosis of idiopathic pulmonary fibrosis. In the spring 2016 was diagnosed with non-small cell lung cancer treated with local radiation at [facility] has since then had follow-up scans there. He is on several liters of oxygen. He is here with his wife. He had tried Esbriet in the past approximately spring summer 2016 but had significant GI intolerance. He recollects having had all kindsr intolerance with it. He does not want to try it again. He is interested in trying nintedanib. He does have coronary artery disease but it is not active and is not on anticoagulationwife is also  reporting some fatigue for the last few weeks to several weeks. Reports that [facility] wanted him to have CBC, chemistry, liver function test and some kind of her tumor marker. I reviewed the [facility] chart but could not find any documentation of what tumor marker they might want    06/21/2015 Follow up : IPF  Pt returns for 1 month follow up . Pt c/o increased SOB at times and prod cough with clear mucus. Feels he is at his baseline  Denies any sinus pressure/drainage, chest tightness/congestion, fever, nausea or vomtiting.   Pt is currently taking OFEV.  This was started last ov.  No n/v/d. No appetite changes.  He remains on oxygen at 3 L at rest and 4 L with walking.    Hx of NSCLC lung cancer Spring 2016 s/p SBRT  Followed at [facility] ,  CT Chest 06/11/2015 showed increase in left upper lobe nodule, similar groundglass opacities in left upper lobe. Felt likely to represent radiation changes. Unchanged right middle lobe lung nodule.    OV 09/24/2015  Chief Complaint  Patient presents with  . 3 month follow up    No changes to breathing.  Very little cough.  No chest tightness, CP, wheezing.  Pt reports he's recently had proteinuria and was advised it may be coming from Ofev.  Pt has appt with urology tomorrow for further assessment.   75 year old male with idiopathic pulmonary fibrosis and chronic  respiratory failure. He is on nintedanib since December 2016. This is a routine follow-up. According to him and his wife overall he's stable. They feel that nintedanib helped his energy levels even though they have been counseled that it does not. He continues to use 4 L oxygen. He has never attended pulmonary rehabilitation. He is tolerating his nintedanib well. His last liver function test was in January 2017 and it was normal. Then in the interim in March 2017 had "stomach bug". He held his nintedanib for a week but now he's taking it and he is feeling well. Of  note he has not attended pulmonary fibrosis foundation support group or pulmonary rehabilitation. He is interested in research protocols.  Only new issues that he has proteinuria and is wondering if this is related to nintedanib. He is due to see a urologist soon. In terms of his lung cancer he says that he had a visit at [place] several weeks ago and is under remission.   OV 01/07/2016  Chief Complaint  Patient presents with  . Follow-up    Pt states his breathing is unchanged since last OV. Pt c/o mild prod cough with clear mucus. Pt denies CP/tightness and f/c/s.     Follow-up idiopathic pulmonary fibrosis on 4 L oxygen with exertion 3 L at rest. Last pulmonary function test March 2016 with an FVC of 2.8 L/59% and a DLCO 7.99/22%. He has intolerance to esbriet. He has been on Ofev and was tolerating it fine but since last visit he started having more diarrhea. He is now cut it down to 150 mg once daily was still causes some mild diarrhea but it's well-controlled with Imodium. He is very interested in research protocols. Unclear to me if he attended pulmonary rehabilitation.he has postnasal drainage and is requesting Nasonex sample. He is due for liver function test today on his anti-fibrotic therapy. He is interested in trying the 100 mg twice daily of the ofev. He has been in touch with pulmonary fibrosis foundation but has not attended the meeting yet but I have encouraged him to   has a past medical history of Allergic rhinitis; Allergic rhinitis, cause unspecified (02/28/2013); Anxiety; Back pain; Bipolar disorder ([place]); Chronic bronchitis ([place]) (02/07/2012); Complete heart block ([place]); Coronary artery disease s/p CABG 1990s; Depression; ED (erectile dysfunction); Fatigue; GERD (gastroesophageal reflux disease); GI bleed; Hip pain, right; History of kidney stones; HTN (hypertension); Hyperlipidemia; Lumbar disc disease; Malignant neoplasm of prostate ([place]); Osteoarthritis; Peptic stricture of  esophagus; Pneumonia (1993); PSA (psoriatic arthritis) ([place]); Pulmonary fibrosis, postinflammatory ([place]); PVD (peripheral vascular disease) ([place]); Shortness of breath dyspnea; and Shoulder joint pain.   reports that he quit smoking about 14 years ago. His smoking use included Cigarettes. He has a 63.00 pack-year smoking history. He has never used smokeless tobacco.  Past Surgical History:  Procedure Laterality Date  . CORONARY ANGIOPLASTY  06/03/2007, 01/25/2009  . CORONARY ARTERY BYPASS GRAFT  20 yrs ago   LIMA to LAD, SVG to PDA, SVG to Ramus, SVG to D  . CYSTOSCOPY    . LEFT AND RIGHT HEART CATHETERIZATION WITH CORONARY ANGIOGRAM N/A 07/05/2012   Procedure: LEFT AND RIGHT HEART CATHETERIZATION WITH CORONARY ANGIOGRAM;  Surgeon: Peter M Martinique, MD;  Location: [facility] CATH LAB;  Service: Cardiovascular;  Laterality: N/A;  . NASAL SEPTUM SURGERY  april 2012   dr britt, [facility] [place]  . PACEMAKER PLACEMENT  08/2011  . PERMANENT PACEMAKER INSERTION N/A 09/14/2011   Procedure: PERMANENT PACEMAKER INSERTION;  Surgeon: Deboraha Sprang, MD;  Location[facility] CATH LAB;  Service: Cardiovascular;  Laterality: N/A;  . VIDEO BRONCHOSCOPY WITH ENDOBRONCHIAL ULTRASOUND Left 09/05/2014   Procedure: VIDEO BRONCHOSCOPY WITH ENDOBRONCHIAL ULTRASOUND;  Surgeon: Collene Gobble, MD;  Location: [place];  Service: Thoracic;  Laterality: Left;    Allergies  Allergen Reactions  . Pirfenidone Nausea And Vomiting    Immunization History  Administered Date(s) Administered  . Influenza Split 03/20/2011, 02/23/2012  . Influenza Whole 02/23/2000, 05/04/2007, 03/11/2010  . Influenza,inj,Quad PF,36+ Mos 02/13/2013, 04/02/2014, 02/06/2015  . Pneumococcal Conjugate-13 05/01/2014  . Pneumococcal Polysaccharide-23 08/26/2007, 02/13/2013  . Td 03/11/2010    Family History  Problem Relation Age of Onset  . Heart attack Father   . Heart disease Father   . Colon cancer Neg Hx   . Cancer Neg Hx     Colon     Current Outpatient  Prescriptions:  .  acetaminophen (TYLENOL) 500 MG tablet, Take 1,000 mg by mouth every 6 (six) hours as needed for moderate pain or headache., Disp: , Rfl:  .  albuterol (PROVENTIL HFA;VENTOLIN HFA) 108 (90 BASE) MCG/ACT inhaler, Inhale 2 puffs into the lungs every 6 (six) hours as needed for wheezing or shortness of breath., Disp: 1 Inhaler, Rfl: 11 .  ALPRAZolam (XANAX) 0.25 MG tablet, TAKE 1 TABLET BY MOUTH EVERY DAY AS NEEDED, Disp: 30 tablet, Rfl: 2 .  amLODipine (NORVASC) 5 MG tablet, TAKE 1/2 TABLET BY MOUTH DAILY, Disp: 90 tablet, Rfl: 0 .  citalopram (CELEXA) 20 MG tablet, Take 1 tablet (20 mg total) by mouth daily., Disp: 90 tablet, Rfl: 3 .  finasteride (PROSCAR) 5 MG tablet, Take 5 mg by mouth daily. , Disp: , Rfl:  .  fluticasone (FLONASE) 50 MCG/ACT nasal spray, Place 2 sprays into the nose daily. Reported on 05/10/2015, Disp: , Rfl:  .  lisinopril (PRINIVIL,ZESTRIL) 10 MG tablet, Take 0.5 tablets (5 mg total) by mouth daily., Disp: 90 tablet, Rfl: 3 .  Magnesium 500 MG TABS, Take 1 tablet by mouth every morning., Disp: , Rfl:  .  metoprolol succinate (TOPROL-XL) 25 MG 24 hr tablet, TAKE 1/2 TABLET BY MOUTH TWICE DAILY, Disp: 90 tablet, Rfl: 3 .  Multiple Vitamins-Minerals (PRESERVISION AREDS PO), Take 1 tablet by mouth 2 (two) times daily., Disp: , Rfl:  .  Nintedanib (OFEV) 150 MG CAPS, Take 150 mg by mouth 2 (two) times daily. (Patient taking differently: Take 150 mg by mouth daily. ), Disp: 60 capsule, Rfl: 0 .  nitroGLYCERIN (NITROSTAT) 0.4 MG SL tablet, Place 1 tablet (0.4 mg total) under the tongue every 5 (five) minutes as needed for chest pain., Disp: 90 tablet, Rfl: 3 .  omeprazole (PRILOSEC) 40 MG capsule, TAKE ONE CAPSULE BY MOUTH EVERY DAY, Disp: 90 capsule, Rfl: 3 .  OXYGEN, Inhale into the lungs. 3 lpm qhs  4 lpm pulsed with exertion as needed DME:, Disp: , Rfl:  .  simvastatin (ZOCOR) 40 MG tablet, TAKE 1 TABLET BY MOUTH AT BEDTIME, Disp: 90 tablet, Rfl: 2 .  tamsulosin  (FLOMAX) 0.4 MG CAPS capsule, Take 0.4 mg by mouth every morning. , Disp: , Rfl: 11    Review of Systems     Objective:   Physical Exam  Constitutional: He is oriented to person, place, and time. He appears well-developed and well-nourished. No distress.  HENT:  Head: Normocephalic and atraumatic.  Right Ear: External ear normal.  Left Ear: External ear normal.  Mouth/Throat: Oropharynx is clear and moist. No  oropharyngeal exudate.  02 on  Eyes: Conjunctivae and EOM are normal. Pupils are equal, round, and reactive to light. Right eye exhibits no discharge. Left eye exhibits no discharge. No scleral icterus.  Neck: Normal range of motion. Neck supple. No JVD present. No tracheal deviation present. No thyromegaly present.  Cardiovascular: Normal rate, regular rhythm and intact distal pulses.  Exam reveals no gallop and no friction rub.   No murmur heard. Pulmonary/Chest: Effort normal. No respiratory distress. He has no wheezes. He has rales. He exhibits no tenderness.  Abdominal: Soft. Bowel sounds are normal. He exhibits no distension and no mass. There is no tenderness. There is no rebound and no guarding.  Musculoskeletal: Normal range of motion. He exhibits no edema or tenderness.  Lymphadenopathy:    He has no cervical adenopathy.  Neurological: He is alert and oriented to person, place, and time. He has normal reflexes. No cranial nerve deficit. Coordination normal.  Skin: Skin is warm and dry. No rash noted. He is not diaphoretic. No erythema. No pallor.  Psychiatric: He has a normal mood and affect. His behavior is normal. Judgment and thought content normal.  Nursing note and vitals reviewed.  Vitals:   01/07/16 1333  BP: 100/62  Pulse: (!) 55  SpO2: 90%  Weight: 215 lb (97.5 kg)  Height: 6' (1.829 m)    Body mass index is 29.16 kg/m.      Assessment:       ICD-9-CM ICD-10-CM   1. IPF (idiopathic pulmonary fibrosis) (HCC) 516.31 J84.112   2. Chronic  respiratory failure with hypoxia (HCC) 518.83 J96.11    799.02    3. Encounter for therapeutic drug monitoring V58.83 Z51.81        Plan:       IPF stable clnically  Plan - check 01/07/2016 -continue o2 as beofre - continue ofev 1 tablet x 186m a day due to diarrhea; you could consider the dose of 1062mtwice a day which is slightly higher but at this level is efficacious - be in touch with PFF foundatipn - will let you know of research protocols if you have any - PFT in 3 month  Followup - ROV 3 months or sooner if needed  - LFT and PFT check at followup  Dr. MuBrand MalesM.D., F.[facility].P Pulmonary and Critical Care Medicine Staff Physician [facility] Pager: [phone] no answer or between  15:00h - 7:00h: call [phone]  01/07/2016 1:52 PM

## 2016-01-08 ENCOUNTER — Telehealth: Payer: Self-pay | Admitting: Internal Medicine

## 2016-01-08 NOTE — Telephone Encounter (Signed)
Notes Recorded by Beckie Busing, CMA on 01/08/2016 at 10:40 AM EDT Attempted to contact patient regarding results. Left message on voicemail for patient to return call. ------ Notes Recorded by Brand Males, MD on 01/07/2016 at 6:09 PM EDT Normal lft ------------------------------------------------------------------------ Spoke with pt. He is aware of results. Nothing further was needed.

## 2016-01-09 ENCOUNTER — Telehealth: Payer: Self-pay | Admitting: Internal Medicine

## 2016-01-09 NOTE — Telephone Encounter (Signed)
Pt changing from Ofev 135m caps to Ofev 1045mcaps. Form faxed to [employer]. Will await PA.

## 2016-01-10 ENCOUNTER — Encounter: Payer: Self-pay | Admitting: Internal Medicine

## 2016-01-10 NOTE — Telephone Encounter (Signed)
Called and spoke with pts wife and she stated that the drug company called them and said that they are ready to send out the Ofev 129m.  [facility] to make them aware.

## 2016-01-10 NOTE — Telephone Encounter (Signed)
Pt calling back about ofev [phone] confusion on what dose

## 2016-01-31 ENCOUNTER — Other Ambulatory Visit: Payer: Medicare Other

## 2016-02-14 ENCOUNTER — Telehealth: Payer: Self-pay | Admitting: Internal Medicine

## 2016-02-14 NOTE — Telephone Encounter (Signed)
Called spoke with patient who verified that he will not be able to come to the fibrosis meeting next week due to a planned eye surgery and he "will not feel like doing anything for a couple of days.  That's what my doctor told me."  Will sign and forward to MR as Juluis Rainier

## 2016-02-14 NOTE — Telephone Encounter (Signed)
Ok no problem

## 2016-03-03 NOTE — Progress Notes (Signed)
Pt walk test

## 2016-03-12 ENCOUNTER — Encounter: Payer: Self-pay | Admitting: Internal Medicine

## 2016-03-16 ENCOUNTER — Telehealth: Payer: Self-pay | Admitting: Internal Medicine

## 2016-03-16 NOTE — Telephone Encounter (Signed)
Spoke with pt. States that he had to stop taking Ofev due to diarrhea. He was taking 130m BID. Pt stopped taking medication on Friday 03/13/16. He would like MR's recommendations. Pt is fine with a call back tomorrow.  MR - please advise. Thanks.

## 2016-03-16 NOTE — Telephone Encounter (Signed)
lmtcb x1 for pt.

## 2016-03-16 NOTE — Telephone Encounter (Signed)
Pt returned phone call. Can be reached at [phone].Wayne Torres

## 2016-03-17 NOTE — Telephone Encounter (Signed)
Stop ofev - list it in Allergies as diarrhea. Currently no other Rx other than getting into trials of which we have none but can have in future  Thanks  Dr. Brand Males, M.D., [facility].C.P Pulmonary and Critical Care Medicine Staff Physician [place] [facility] and Critical [facility] Pager: [phone], If no answer or between  15:00h - 7:00h: call [phone]  03/17/2016 8:47 AM

## 2016-03-17 NOTE — Telephone Encounter (Signed)
lmtcb x1 for pt. 

## 2016-03-18 NOTE — Telephone Encounter (Signed)
Pt returned phone call..contact number [phone]

## 2016-03-18 NOTE — Telephone Encounter (Signed)
Pt aware of rec's per MR. Nothing further needed.

## 2016-03-18 NOTE — Telephone Encounter (Signed)
lmomtcb x 2

## 2016-03-24 ENCOUNTER — Encounter: Payer: Self-pay | Admitting: Internal Medicine

## 2016-03-24 ENCOUNTER — Ambulatory Visit (INDEPENDENT_AMBULATORY_CARE_PROVIDER_SITE_OTHER): Payer: Medicare Other | Admitting: Internal Medicine

## 2016-03-24 VITALS — BP 106/60 | HR 92 | Ht 72.0 in | Wt 206.2 lb

## 2016-03-24 DIAGNOSIS — Z950 Presence of cardiac pacemaker: Secondary | ICD-10-CM

## 2016-03-24 DIAGNOSIS — I442 Atrioventricular block, complete: Secondary | ICD-10-CM | POA: Diagnosis not present

## 2016-03-24 DIAGNOSIS — I2589 Other forms of chronic ischemic heart disease: Secondary | ICD-10-CM

## 2016-03-24 DIAGNOSIS — I5022 Chronic systolic (congestive) heart failure: Secondary | ICD-10-CM | POA: Diagnosis not present

## 2016-03-24 DIAGNOSIS — I255 Ischemic cardiomyopathy: Secondary | ICD-10-CM | POA: Diagnosis not present

## 2016-03-24 NOTE — Progress Notes (Signed)
Patient Care Team: [name], PA-C as PCP - General (Physician Assistant) [name], MD as Referring Physician (Cardiology) Elsie Stain, MD as Attending Physician (Pulmonary Disease)   HPI  Wayne Torres is a 75 y.o. male Seen in followup for pacemaker implanted April 2013 for high-grade heart block.  He has  a history of remote bypass surgery 2014  underwent catheterization last year and there has been interval decrease in left ventricular function to 30%. Global hypokinesis. His LIMA was patent. His vein graft to his obtuse marginal and diagonal was patent. Vein graft to the RCA was occluded.  He denies significant shortness of breath that he does not ascribe to his pulmonary fibrosis.   Echocardiogram 7/15 demonstrated an ejection fraction of 35-40%  He has intercurrently been diagnosed with lung cancer squamous cell stage I for which he is followed at [facility]. Note 8/17 was reviewed He has just finished radiation therapy. He is now on chronic oxygen. He also has a history of pulmonary fibrosis. Most recent evaluation demonstrated no progression  He is on home oxygen.  7/17 Cr 1.3  K4.3    Past Medical History:  Diagnosis Date  . Allergic rhinitis   . Allergic rhinitis, cause unspecified 02/28/2013  . Anxiety   . Back pain   . Bipolar disorder ([place])   . Chronic bronchitis ([place]) 02/07/2012  . Complete heart block (HCC)    a. s/p [facility] ppm, ser # [id number]  . Coronary artery disease s/p CABG 1990s    a. approx 1994 s/p CABG x 4 (LIMA->LAD, VG->RCA, VG->RI, VG->Diag);  b. 11/2008 Cath: LM 100, LAD small, RCA 50-60p, 100d, VG->RCA 100 ost, VG->RI 69m VG->Diag 40 - upper branch of diag small, 99%, LIMA-LAD patent.;  c. 08/2011 Echo: EF 45-50%, mod dil LA,/RA/RV with mild-mod reduced RV fxn.  . Depression   . ED (erectile dysfunction)   . Fatigue   . GERD (gastroesophageal reflux disease)   . GI bleed   . Hip pain, right   . History of kidney  stones   . HTN (hypertension)   . Hyperlipidemia   . Lumbar disc disease   . Malignant neoplasm of prostate ([place]   . Osteoarthritis   . Peptic stricture of esophagus   . Pneumonia 1993   post-op  . PSA (psoriatic arthritis) ([place]    increased  . Pulmonary fibrosis, postinflammatory ([place]    Stopped Cell[employer] 02/13/13 d/t cost   . PVD (peripheral vascular disease) ([place]    a. 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  bilat ext iliac dzs.  . Shortness of breath dyspnea   . Shoulder joint pain     Past Surgical History:  Procedure Laterality Date  . CORONARY ANGIOPLASTY  06/03/2007, 01/25/2009  . CORONARY ARTERY BYPASS GRAFT  20 yrs ago   LIMA to LAD, SVG to PDA, SVG to Ramus, SVG to D  . CYSTOSCOPY    . LEFT AND RIGHT HEART CATHETERIZATION WITH CORONARY ANGIOGRAM N/A 07/05/2012   Procedure: LEFT AND RIGHT HEART CATHETERIZATION WITH CORONARY ANGIOGRAM;  Surgeon: Peter M JMartinique MD;  [facility];  Service: Cardiovascular;  Laterality: N/A;  . NASAL SEPTUM SURGERY  april 2012   dr britt, [facility]  . PACEMAKER PLACEMENT  08/2011  . PERMANENT PACEMAKER INSERTION N/A 09/14/2011   Procedure: PERMANENT PACEMAKER INSERTION;  Surgeon: SDeboraha Sprang MD;  [facility];  Service: Cardiovascular;  Laterality: N/A;  . VIDEO BRONCHOSCOPY WITH ENDOBRONCHIAL ULTRASOUND Left 09/05/2014   Procedure: VIDEO BRONCHOSCOPY WITH ENDOBRONCHIAL ULTRASOUND;  Surgeon: Collene Gobble, MD;  Location: [facility] OR;  Service: Thoracic;  Laterality: Left;    Current Outpatient Prescriptions  Medication Sig Dispense Refill  . acetaminophen (TYLENOL) 500 MG tablet Take 1,000 mg by mouth every 6 (six) hours as needed for moderate pain or headache.    . albuterol (PROVENTIL HFA;VENTOLIN HFA) 108 (90 BASE) MCG/ACT inhaler Inhale 2 puffs into the lungs every 6 (six) hours as needed for wheezing or shortness of breath. 1 Inhaler 11  . ALPRAZolam (XANAX) 0.25 MG tablet TAKE  1 TABLET BY MOUTH EVERY DAY AS NEEDED 30 tablet 2  . amLODipine (NORVASC) 5 MG tablet TAKE 1/2 TABLET BY MOUTH DAILY 90 tablet 0  . citalopram (CELEXA) 20 MG tablet Take 1 tablet (20 mg total) by mouth daily. 90 tablet 3  . finasteride (PROSCAR) 5 MG tablet Take 5 mg by mouth daily.     . fluticasone (FLONASE) 50 MCG/ACT nasal spray Place 2 sprays into the nose daily. Reported on 05/10/2015    . lisinopril (PRINIVIL,ZESTRIL) 10 MG tablet Take 0.5 tablets (5 mg total) by mouth daily. 90 tablet 3  . Magnesium 500 MG TABS Take 1 tablet by mouth every morning.    . metoprolol succinate (TOPROL-XL) 25 MG 24 hr tablet TAKE 1/2 TABLET BY MOUTH TWICE DAILY 90 tablet 3  . Multiple Vitamins-Minerals (PRESERVISION AREDS PO) Take 1 tablet by mouth 2 (two) times daily.    . Nintedanib (OFEV) 150 MG CAPS Take 150 mg by mouth 2 (two) times daily. (Patient taking differently: Take 150 mg by mouth daily. ) 60 capsule 0  . nitroGLYCERIN (NITROSTAT) 0.4 MG SL tablet Place 1 tablet (0.4 mg total) under the tongue every 5 (five) minutes as needed for chest pain. 90 tablet 3  . omeprazole (PRILOSEC) 40 MG capsule TAKE ONE CAPSULE BY MOUTH EVERY DAY 90 capsule 3  . OXYGEN Inhale into the lungs. 3 lpm qhs  4 lpm pulsed with exertion as needed DME:    . simvastatin (ZOCOR) 40 MG tablet TAKE 1 TABLET BY MOUTH AT BEDTIME 90 tablet 2  . tamsulosin (FLOMAX) 0.4 MG CAPS capsule Take 0.4 mg by mouth every morning.   11   No current facility-administered medications for this visit.     Allergies  Allergen Reactions  . Pirfenidone Nausea And Vomiting    Review of Systems negative except from HPI and PMH  Physical Exam BP 106/60   Pulse 92   Ht 6' (1.829 m)   Wt 206 lb 3.2 oz (93.5 kg)   SpO2 90%   BMI 27.97 kg/m  Well developed and well nourished in moderate respiratory distress using oxygen HENT normal E scleral and icterus clear Neck Supple JVP flat; carotids brisk and full Clear to ausculation Device  pocket well healed; without hematoma or erythema.  There is no tethering Regular rate and rhythm, no murmurs gallops or rub Soft with active bowel sounds No clubbing cyanosis  Edema Alert and oriented, grossly normal motor and sensory function Skin Warm and Dry    ECG demonstrates P. synchronous pacing Intervals 18/19/49 Axis left -77  Assessment and  Plan  Ischemic cardiomyopathy  Complete heart block  Hypotension relative  Pacemaker-St. Jude  The patient's device was interrogated.  The information was reviewed. No changes were made in the programming.    Coronary artery disease  He is euvolemic. Continue him on his current medications.  Without symptoms of ischemia. I'm concerned about LV dysfunction and to maintain his dyspnea; we will repeat an ultrasound.   Given his low blood pressure, at this point I will discontinue amlodipine. We will continue his beta blocker given his modest LV dysfunction

## 2016-03-24 NOTE — Patient Instructions (Addendum)
Medication Instructions: - Your physician has recommended you make the following change in your medication:  1) Stop amlodipine  Labwork: - none ordered  Procedures/Testing: - none ordered  Follow-Up: - Remote monitoring is used to monitor your Pacemaker of ICD from home. This monitoring reduces the number of office visits required to check your device to one time per year. It allows [place] to keep an eye on the functioning of your device to ensure it is working properly. You are scheduled for a device check from home on 06/23/16. You may send your transmission at any time that day. If you have a wireless device, the transmission will be sent automatically. After your physician reviews your transmission, you will receive a postcard with your next transmission date.  - Your physician wants you to follow-up in: April 2018 with Dr. Harrington Challenger. You will receive a reminder letter in the mail two months in advance. If you don't receive a letter, please call our office to schedule the follow-up appointment.  - Your physician wants you to follow-up in: 1 year with Tommye Standard, PA for Dr. Caryl Comes. You will receive a reminder letter in the mail two months in advance. If you don't receive a letter, please call our office to schedule the follow-up appointment.   Any Additional Special Instructions Will Be Listed Below (If Applicable).     If you need a refill on your cardiac medications before your next appointment, please call your pharmacy.

## 2016-04-03 LAB — CUP PACEART INCLINIC DEVICE CHECK
Battery Remaining Longevity: 111 mo
Battery Voltage: 2.93 V
Brady Statistic RA Percent Paced: 36 %
Date Time Interrogation Session: 20171031195415
Implantable Lead Implant Date: 20130422
Implantable Lead Location: 753859
Implantable Lead Location: 753860
Implantable Pulse Generator Implant Date: 20130422
Lead Channel Impedance Value: 462.5 Ohm
Lead Channel Pacing Threshold Amplitude: 0.5 V
Lead Channel Pacing Threshold Amplitude: 0.75 V
Lead Channel Pacing Threshold Pulse Width: 0.4 ms
Lead Channel Pacing Threshold Pulse Width: 0.4 ms
Lead Channel Pacing Threshold Pulse Width: 0.4 ms
Lead Channel Sensing Intrinsic Amplitude: 12 mV
Lead Channel Setting Pacing Amplitude: 1.5 V
MDC IDC LEAD IMPLANT DT: 20130422
MDC IDC MSMT LEADCHNL RA PACING THRESHOLD AMPLITUDE: 0.5 V
MDC IDC MSMT LEADCHNL RA SENSING INTR AMPL: 5 mV
MDC IDC MSMT LEADCHNL RV IMPEDANCE VALUE: 550 Ohm
MDC IDC MSMT LEADCHNL RV PACING THRESHOLD AMPLITUDE: 0.75 V
MDC IDC MSMT LEADCHNL RV PACING THRESHOLD PULSEWIDTH: 0.4 ms
MDC IDC PG SERIAL: 7327490
MDC IDC SET LEADCHNL RV PACING AMPLITUDE: 0.875
MDC IDC SET LEADCHNL RV PACING PULSEWIDTH: 0.4 ms
MDC IDC SET LEADCHNL RV SENSING SENSITIVITY: 2 mV
MDC IDC STAT BRADY RV PERCENT PACED: 88 %

## 2016-04-13 ENCOUNTER — Ambulatory Visit: Payer: Medicare Other | Admitting: Internal Medicine

## 2016-04-14 ENCOUNTER — Ambulatory Visit: Payer: Medicare Other | Admitting: Internal Medicine

## 2016-04-29 ENCOUNTER — Ambulatory Visit (INDEPENDENT_AMBULATORY_CARE_PROVIDER_SITE_OTHER): Payer: Medicare Other | Admitting: Internal Medicine

## 2016-04-29 ENCOUNTER — Encounter: Payer: Self-pay | Admitting: Internal Medicine

## 2016-04-29 VITALS — BP 124/72 | HR 90 | Ht 72.0 in | Wt 215.0 lb

## 2016-04-29 DIAGNOSIS — J84112 Idiopathic pulmonary fibrosis: Secondary | ICD-10-CM | POA: Diagnosis not present

## 2016-04-29 DIAGNOSIS — I255 Ischemic cardiomyopathy: Secondary | ICD-10-CM

## 2016-04-29 DIAGNOSIS — J9611 Chronic respiratory failure with hypoxia: Secondary | ICD-10-CM

## 2016-04-29 DIAGNOSIS — Z5181 Encounter for therapeutic drug level monitoring: Secondary | ICD-10-CM

## 2016-04-29 LAB — PULMONARY FUNCTION TEST
DL/VA % PRED: 34 %
DL/VA: 1.6 ml/min/mmHg/L
DLCO COR % PRED: 17 %
DLCO COR: 6.29 ml/min/mmHg
DLCO unc % pred: 18 %
DLCO unc: 6.46 ml/min/mmHg
FEF 25-75 PRE: 2.42 L/s
FEF2575-%Pred-Pre: 98 %
FEV1-%Pred-Pre: 77 %
FEV1-PRE: 2.6 L
FEV1FVC-%Pred-Pre: 108 %
FEV6-%Pred-Pre: 75 %
FEV6-Pre: 3.28 L
FEV6FVC-%Pred-Pre: 106 %
FVC-%PRED-PRE: 71 %
FVC-PRE: 3.28 L
PRE FEV6/FVC RATIO: 100 %
Pre FEV1/FVC ratio: 79 %

## 2016-04-29 NOTE — Progress Notes (Signed)
pft

## 2016-04-29 NOTE — Patient Instructions (Addendum)
1. IPF (idiopathic pulmonary fibrosis) (HCC)    IPF stable clnically Off ofev since oct 201 You are technically intolerant to all approved IPF treatment  Plan - will list ofev as allergy and take it off mar - use o2 at night and exertion as before  - stay active and fit - please join PFF foundation support group if you have not already = will look for any clinical trials for you but cancer history could be an exclusion =  Followup  - April 2018 with Pre-bd spiro and dlco only. No lung volume or bd response. No post-bd spiro @ followup

## 2016-04-29 NOTE — Progress Notes (Signed)
Subjective:     Patient ID: Wayne Torres, male   DOB: 06-30-1940, 75 y.o.   MRN: [id number]  HPI     02/06/2015 Chief Complaint  Patient presents with  . Follow-up    pt states he is doing well, his breathing is getting better. pt on O2 with activity and at night.  pulse is set at 4LM with activity. at night continuous on 3LPM. DME: [facility].     Pt was doing well until one week ago, felt poorly.  Noted constant belching. Symptoms better off esbriet .  Pt had SBRT to lung ca and f/u CT was stable.  Min cough . No hemoptysis  Pt denies any significant sore throat, nasal congestion or excess secretions, fever, chills, sweats, unintended weight loss, pleurtic or exertional chest pain, orthopnea PND, or leg swelling Pt denies any increase in rescue therapy over baseline, denies waking up needing it or having any early am or nocturnal exacerbations of coughing/wheezing/or dyspnea. Pt also denies any obvious fluctuation in symptoms with  weather or environmental change or other alleviating or aggravating factors    OV 05/10/2015  Chief Complaint  Patient presents with  . Follow-up    Former [facility] pt, seen for IPF. Pt states his breathing is unchanged since OV in Sept. Pt denies cough and CP/tightness.    Former patient of Dr. Asencion Noble has since retired from the practice. Has clinical diagnosis of idiopathic pulmonary fibrosis. In the spring 2016 was diagnosed with non-small cell lung cancer treated with local radiation at [facility] has since then had follow-up scans there. He is on several liters of oxygen. He is here with his wife. He had tried Esbriet in the past approximately spring summer 2016 but had significant GI intolerance. He recollects having had all kindsr intolerance with it. He does not want to try it again. He is interested in trying nintedanib. He does have coronary artery disease but it is not active and is not on anticoagulationwife is also reporting some fatigue  for the last few weeks to several weeks. Reports that [facility] wanted him to have CBC, chemistry, liver function test and some kind of her tumor marker. I reviewed the [facility] chart but could not find any documentation of what tumor marker they might want    06/21/2015 Follow up : IPF  Pt returns for 1 month follow up . Pt c/o increased SOB at times and prod cough with clear mucus. Feels he is at his baseline  Denies any sinus pressure/drainage, chest tightness/congestion, fever, nausea or vomtiting.   Pt is currently taking OFEV.  This was started last ov.  No n/v/d. No appetite changes.  He remains on oxygen at 3 L at rest and 4 L with walking.    Hx of NSCLC lung cancer Spring 2016 s/p SBRT  Followed at [facility] ,  CT Chest 06/11/2015 showed increase in left upper lobe nodule, similar groundglass opacities in left upper lobe. Felt likely to represent radiation changes. Unchanged right middle lobe lung nodule.    OV 09/24/2015  Chief Complaint  Patient presents with  . 3 month follow up    No changes to breathing.  Very little cough.  No chest tightness, CP, wheezing.  Pt reports he's recently had proteinuria and was advised it may be coming from Ofev.  Pt has appt with urology tomorrow for further assessment.   75 year old male with idiopathic pulmonary fibrosis and chronic respiratory failure. He  is on nintedanib since December 2016. This is a routine follow-up. According to him and his wife overall he's stable. They feel that nintedanib helped his energy levels even though they have been counseled that it does not. He continues to use 4 L oxygen. He has never attended pulmonary rehabilitation. He is tolerating his nintedanib well. His last liver function test was in January 2017 and it was normal. Then in the interim in March 2017 had "stomach bug". He held his nintedanib for a week but now he's taking it and he is feeling well. Of note he has not attended  pulmonary fibrosis foundation support group or pulmonary rehabilitation. He is interested in research protocols.  Only new issues that he has proteinuria and is wondering if this is related to nintedanib. He is due to see a urologist soon. In terms of his lung cancer he says that he had a visit at [place] several weeks ago and is under remission.   OV 01/07/2016  Chief Complaint  Patient presents with  . Follow-up    Pt states his breathing is unchanged since last OV. Pt c/o mild prod cough with clear mucus. Pt denies CP/tightness and f/c/s.     Follow-up idiopathic pulmonary fibrosis on 4 L oxygen with exertion 3 L at rest. Last pulmonary function test March 2016 with an FVC of 2.8 L/59% and a DLCO 7.99/22%. He has intolerance to esbriet. He has been on Ofev and was tolerating it fine but since last visit he started having more diarrhea. He is now cut it down to 150 mg once daily was still causes some mild diarrhea but it's well-controlled with Imodium. He is very interested in research protocols. Unclear to me if he attended pulmonary rehabilitation.he has postnasal drainage and is requesting Nasonex sample. He is due for liver function test today on his anti-fibrotic therapy. He is interested in trying the 100 mg twice daily of the ofev. He has been in touch with pulmonary fibrosis foundation but has not attended the meeting yet but I have encouraged him to  [place] 04/29/2016  Chief Complaint  Patient presents with  . Follow-up    3 month follow up with PFT results    Fu IPF   Idiopathic pulmonary fibrosis. He called in October 2017 and due to significant diarrhea he could not take Ofev anymore. He stopped it. He is currently off all anti-fibrotic therapy. At this point in time he is intolerant to the to approved anti-fibrotic therapies. He is feeling well. Feels dyspnea is stable. Feels IPF is stable. Walking desaturation test 185 feet 3 laps on room air: He desaturated to 79% on the  second lap and had to wear his oxygen. He did feel his dyspnea. He does not want to try any antibiotic fibrotic therapy anymore. He is open in participating in clinical trials of which we have none at this point   Allergies  Allergen Reactions  . Pirfenidone Nausea And Vomiting      Results for DEAVEON, SCHOEN (MRN [id number]) as of 04/29/2016 13:47  Ref. Range 08/21/2014 13:04 04/29/2016 12:59  FVC-Pre Latest Units: L 2.80 3.28  FVC-%Pred-Pre Latest Units: % 59 71   Results for ACY, ORSAK (MRN [id number]) as of 04/29/2016 13:47  Ref. Range 08/21/2014 13:04 04/29/2016 12:59  DLCO unc Latest Units: ml/min/mmHg 7.99 6.46  DLCO unc % pred Latest Units: % 22 18     has a past medical history of Allergic rhinitis; Allergic rhinitis, cause unspecified (  02/28/2013); Anxiety; Back pain; Bipolar disorder ([place]); Chronic bronchitis ([place]) (02/07/2012); Complete heart block ([place]); Coronary artery disease s/p CABG 1990s; Depression; ED (erectile dysfunction); Fatigue; GERD (gastroesophageal reflux disease); GI bleed; Hip pain, right; History of kidney stones; HTN (hypertension); Hyperlipidemia; Lumbar disc disease; Malignant neoplasm of prostate ([place]); Osteoarthritis; Peptic stricture of esophagus; Pneumonia (1993); PSA (psoriatic arthritis) ([place]); Pulmonary fibrosis, postinflammatory ([place]); PVD (peripheral vascular disease) ([place]); Shortness of breath dyspnea; and Shoulder joint pain.   reports that he quit smoking about 14 years ago. His smoking use included Cigarettes. He has a 63.00 pack-year smoking history. He has never used smokeless tobacco.  Past Surgical History:  Procedure Laterality Date  . CORONARY ANGIOPLASTY  06/03/2007, 01/25/2009  . CORONARY ARTERY BYPASS GRAFT  20 yrs ago   LIMA to LAD, SVG to PDA, SVG to Ramus, SVG to D  . CYSTOSCOPY    . LEFT AND RIGHT HEART CATHETERIZATION WITH CORONARY ANGIOGRAM N/A 07/05/2012   Procedure: LEFT AND RIGHT HEART CATHETERIZATION WITH CORONARY ANGIOGRAM;   Surgeon: Peter M Martinique, MD;  Location: [facility] CATH LAB;  Service: Cardiovascular;  Laterality: N/A;  . NASAL SEPTUM SURGERY  april 2012   dr britt, [place] [facility] [place]  . PACEMAKER PLACEMENT  08/2011  . PERMANENT PACEMAKER INSERTION N/A 09/14/2011   Procedure: PERMANENT PACEMAKER INSERTION;  Surgeon: Deboraha Sprang, MD;  Location: [facility] CATH LAB;  Service: Cardiovascular;  Laterality: N/A;  . VIDEO BRONCHOSCOPY WITH ENDOBRONCHIAL ULTRASOUND Left 09/05/2014   Procedure: VIDEO BRONCHOSCOPY WITH ENDOBRONCHIAL ULTRASOUND;  Surgeon: Collene Gobble, MD;  Location: [place];  Service: Thoracic;  Laterality: Left;    Allergies  Allergen Reactions  . Pirfenidone Nausea And Vomiting    Immunization History  Administered Date(s) Administered  . Influenza Split 03/20/2011, 02/23/2012  . Influenza Whole 02/23/2000, 05/04/2007, 03/11/2010  . Influenza,inj,Quad PF,36+ Mos 02/13/2013, 04/02/2014, 02/06/2015  . Pneumococcal Conjugate-13 05/01/2014  . Pneumococcal Polysaccharide-23 08/26/2007, 02/13/2013  . Td 03/11/2010    Family History  Problem Relation Age of Onset  . Heart attack Father   . Heart disease Father   . Colon cancer Neg Hx   . Cancer Neg Hx     Colon     Current Outpatient Prescriptions:  .  acetaminophen (TYLENOL) 500 MG tablet, Take 1,000 mg by mouth every 6 (six) hours as needed for moderate pain or headache., Disp: , Rfl:  .  albuterol (PROVENTIL HFA;VENTOLIN HFA) 108 (90 BASE) MCG/ACT inhaler, Inhale 2 puffs into the lungs every 6 (six) hours as needed for wheezing or shortness of breath., Disp: 1 Inhaler, Rfl: 11 .  ALPRAZolam (XANAX) 0.25 MG tablet, TAKE 1 TABLET BY MOUTH EVERY DAY AS NEEDED, Disp: 30 tablet, Rfl: 2 .  citalopram (CELEXA) 20 MG tablet, Take 1 tablet (20 mg total) by mouth daily., Disp: 90 tablet, Rfl: 3 .  finasteride (PROSCAR) 5 MG tablet, Take 5 mg by mouth daily. , Disp: , Rfl:  .  fluticasone (FLONASE) 50 MCG/ACT nasal spray, Place 2 sprays into the nose daily.  Reported on 05/10/2015, Disp: , Rfl:  .  lisinopril (PRINIVIL,ZESTRIL) 10 MG tablet, Take 0.5 tablets (5 mg total) by mouth daily., Disp: 90 tablet, Rfl: 3 .  Magnesium 500 MG TABS, Take 1 tablet by mouth every morning., Disp: , Rfl:  .  metoprolol succinate (TOPROL-XL) 25 MG 24 hr tablet, Take 1/2 tablet (12.5 mg) by mouth twice daily, Disp: , Rfl:  .  Multiple Vitamins-Minerals (PRESERVISION AREDS PO), Take 1 tablet by mouth  2 (two) times daily., Disp: , Rfl:  .  Nintedanib (OFEV) 150 MG CAPS, Take 150 mg by mouth 2 (two) times daily. (Patient taking differently: Take 150 mg by mouth daily. ), Disp: 60 capsule, Rfl: 0 .  nitroGLYCERIN (NITROSTAT) 0.4 MG SL tablet, Place 1 tablet (0.4 mg total) under the tongue every 5 (five) minutes as needed for chest pain., Disp: 90 tablet, Rfl: 3 .  omeprazole (PRILOSEC) 40 MG capsule, TAKE ONE CAPSULE BY MOUTH EVERY DAY, Disp: 90 capsule, Rfl: 3 .  OXYGEN, Inhale into the lungs. 3 lpm qhs  4 lpm pulsed with exertion as needed DME:, Disp: , Rfl:  .  simvastatin (ZOCOR) 40 MG tablet, TAKE 1 TABLET BY MOUTH AT BEDTIME, Disp: 90 tablet, Rfl: 2 .  tamsulosin (FLOMAX) 0.4 MG CAPS capsule, Take 0.4 mg by mouth every morning. , Disp: , Rfl: 11   Review of Systems     Objective:   Physical Exam  Constitutional: He is oriented to person, place, and time. He appears well-developed and well-nourished. No distress.  HENT:  Head: Normocephalic and atraumatic.  Right Ear: External ear normal.  Left Ear: External ear normal.  Mouth/Throat: Oropharynx is clear and moist. No oropharyngeal exudate.  Eyes: Conjunctivae and EOM are normal. Pupils are equal, round, and reactive to light. Right eye exhibits no discharge. Left eye exhibits no discharge. No scleral icterus.  Neck: Normal range of motion. Neck supple. No JVD present. No tracheal deviation present. No thyromegaly present.  Cardiovascular: Normal rate, regular rhythm and intact distal pulses.  Exam reveals no  gallop and no friction rub.   No murmur heard. Pulmonary/Chest: Effort normal. No respiratory distress. He has no wheezes. He has rales. He exhibits no tenderness.  Abdominal: Soft. Bowel sounds are normal. He exhibits no distension and no mass. There is no tenderness. There is no rebound and no guarding.  Musculoskeletal: Normal range of motion. He exhibits no edema or tenderness.  Lymphadenopathy:    He has no cervical adenopathy.  Neurological: He is alert and oriented to person, place, and time. He has normal reflexes. No cranial nerve deficit. Coordination normal.  Skin: Skin is warm and dry. No rash noted. He is not diaphoretic. No erythema. No pallor.  Psychiatric: He has a normal mood and affect. His behavior is normal. Judgment and thought content normal.  Nursing note and vitals reviewed.  Vitals:   04/29/16 1337 04/29/16 1340  BP: 124/72   Pulse: 90   SpO2: (!) 80% 96%  Weight: 215 lb (97.5 kg)   Height: 6' (1.829 m)     BMI    Body Mass Index:  29.16 kg/m         Assessment:     1. IPF (idiopathic pulmonary fibrosis) (HCC)      Plan:      IPF stable clnically Off ofev since oct 201 You are technically intolerant to all approved IPF treatment  Plan - will list ofev as allergy and take it off mar - use o2 at night and exertion as before  - stay active and fit - please join PFF foundation support group if you have not already = will look for any clinical trials for you but cancer history could be an exclusion =  Followup  - April 2018 with Pre-bd spiro and dlco only. No lung volume or bd response. No post-bd spiro @ followup    Dr. Brand Males, M.D., [facility].C.P Pulmonary and Critical Care Medicine Staff Physician [facility]  [place] [facility] Pager: [phone], If no answer or between  15:00h - 7:00h: call [phone]  04/29/2016 2:05 PM

## 2016-05-01 ENCOUNTER — Other Ambulatory Visit: Payer: Self-pay

## 2016-06-23 ENCOUNTER — Ambulatory Visit (INDEPENDENT_AMBULATORY_CARE_PROVIDER_SITE_OTHER): Payer: Medicare Other | Admitting: *Deleted

## 2016-06-23 DIAGNOSIS — I255 Ischemic cardiomyopathy: Secondary | ICD-10-CM

## 2016-06-23 DIAGNOSIS — I442 Atrioventricular block, complete: Secondary | ICD-10-CM

## 2016-06-23 NOTE — Progress Notes (Signed)
Remote ICD transmission.   

## 2016-06-24 ENCOUNTER — Encounter: Payer: Self-pay | Admitting: Cardiology

## 2016-07-05 LAB — CUP PACEART REMOTE DEVICE CHECK
Battery Remaining Longevity: 111 mo
Battery Remaining Percentage: 81 %
Brady Statistic AP VS Percent: 1 %
Brady Statistic RV Percent Paced: 90 %
Implantable Lead Implant Date: 20130422
Implantable Lead Location: 753860
Lead Channel Impedance Value: 460 Ohm
Lead Channel Pacing Threshold Amplitude: 0.625 V
Lead Channel Pacing Threshold Pulse Width: 0.4 ms
Lead Channel Sensing Intrinsic Amplitude: 5 mV
Lead Channel Setting Pacing Pulse Width: 0.4 ms
Lead Channel Setting Sensing Sensitivity: 2 mV
MDC IDC LEAD IMPLANT DT: 20130422
MDC IDC LEAD LOCATION: 753859
MDC IDC MSMT BATTERY VOLTAGE: 2.93 V
MDC IDC MSMT LEADCHNL RA PACING THRESHOLD AMPLITUDE: 0.625 V
MDC IDC MSMT LEADCHNL RV IMPEDANCE VALUE: 530 Ohm
MDC IDC MSMT LEADCHNL RV PACING THRESHOLD PULSEWIDTH: 0.4 ms
MDC IDC MSMT LEADCHNL RV SENSING INTR AMPL: 12 mV
MDC IDC PG IMPLANT DT: 20130422
MDC IDC PG SERIAL: 7327490
MDC IDC SESS DTM: 20180130151707
MDC IDC SET LEADCHNL RA PACING AMPLITUDE: 1.625
MDC IDC SET LEADCHNL RV PACING AMPLITUDE: 0.875
MDC IDC STAT BRADY AP VP PERCENT: 42 %
MDC IDC STAT BRADY AS VP PERCENT: 48 %
MDC IDC STAT BRADY AS VS PERCENT: 2.5 %
MDC IDC STAT BRADY RA PERCENT PACED: 36 %

## 2016-07-08 ENCOUNTER — Encounter: Payer: Self-pay | Admitting: Cardiology

## 2016-09-08 ENCOUNTER — Ambulatory Visit: Payer: Medicare Other | Admitting: Internal Medicine

## 2016-09-08 ENCOUNTER — Encounter: Payer: Self-pay | Admitting: Internal Medicine

## 2016-09-21 ENCOUNTER — Ambulatory Visit (INDEPENDENT_AMBULATORY_CARE_PROVIDER_SITE_OTHER): Payer: Medicare Other | Admitting: Internal Medicine

## 2016-09-21 ENCOUNTER — Encounter: Payer: Self-pay | Admitting: Internal Medicine

## 2016-09-21 DIAGNOSIS — J84112 Idiopathic pulmonary fibrosis: Secondary | ICD-10-CM

## 2016-09-21 DIAGNOSIS — I255 Ischemic cardiomyopathy: Secondary | ICD-10-CM

## 2016-09-21 NOTE — Patient Instructions (Signed)
IPF (idiopathic pulmonary fibrosis) Clinically stable since last visit Too bad intolerant to both esbriet and ofev  PLAN Continue to use oxygen for exerion and be active Will consider you for upcoming research trial though recent history of lung cancer can be an exclusion Please continue to be active with PFF support group to extent possible  followup 3 months or sooner if needed

## 2016-09-21 NOTE — Assessment & Plan Note (Signed)
Clinically stable since last visit Too bad intolerant to both esbriet and ofev  PLAN Continue to use oxygen for exerion and be active Will consider you for upcoming research trial though recent history of lung cancer can be an exclusion Please continue to be active with PFF support group to extent possible  followup 3 months or sooner if needed

## 2016-09-21 NOTE — Progress Notes (Signed)
Subjective:     Patient ID: Wayne Torres, male   DOB: 12-31-1940, 76 y.o.   MRN: [id number]  HPI    02/06/2015 Chief Complaint  Patient presents with  . Follow-up    pt states he is doing well, his breathing is getting better. pt on O2 with activity and at night.  pulse is set at 4LM with activity. at night continuous on 3LPM. DME: [facility].     Pt was doing well until one week ago, felt poorly.  Noted constant belching. Symptoms better off esbriet .  Pt had SBRT to lung ca and f/u CT was stable.  Min cough . No hemoptysis  Pt denies any significant sore throat, nasal congestion or excess secretions, fever, chills, sweats, unintended weight loss, pleurtic or exertional chest pain, orthopnea PND, or leg swelling Pt denies any increase in rescue therapy over baseline, denies waking up needing it or having any early am or nocturnal exacerbations of coughing/wheezing/or dyspnea. Pt also denies any obvious fluctuation in symptoms with  weather or environmental change or other alleviating or aggravating factors    OV 05/10/2015  Chief Complaint  Patient presents with  . Follow-up    Former PW pt, seen for IPF. Pt states his breathing is unchanged since OV in Sept. Pt denies cough and CP/tightness.    Former patient of Dr. Asencion Noble has since retired from the practice. Has clinical diagnosis of idiopathic pulmonary fibrosis. In the spring 2016 was diagnosed with non-small cell lung cancer treated with local radiation at [facility] has since then had follow-up scans there. He is on several liters of oxygen. He is here with his wife. He had tried Esbriet in the past approximately spring summer 2016 but had significant GI intolerance. He recollects having had all kindsr intolerance with it. He does not want to try it again. He is interested in trying nintedanib. He does have coronary artery disease but it is not active and is not on anticoagulationwife is also reporting some fatigue  for the last few weeks to several weeks. Reports that [facility] wanted him to have CBC, chemistry, liver function test and some kind of her tumor marker. I reviewed the [facility] chart but could not find any documentation of what tumor marker they might want    06/21/2015 Follow up : IPF  Pt returns for 1 month follow up . Pt c/o increased SOB at times and prod cough with clear mucus. Feels he is at his baseline  Denies any sinus pressure/drainage, chest tightness/congestion, fever, nausea or vomtiting.   Pt is currently taking OFEV.  This was started last ov.  No n/v/d. No appetite changes.  He remains on oxygen at 3 L at rest and 4 L with walking.    Hx of NSCLC lung cancer Spring 2016 s/p SBRT  Followed at [facility] ,  CT Chest 06/11/2015 showed increase in left upper lobe nodule, similar groundglass opacities in left upper lobe. Felt likely to represent radiation changes. Unchanged right middle lobe lung nodule.    OV 09/24/2015  Chief Complaint  Patient presents with  . 3 month follow up    No changes to breathing.  Very little cough.  No chest tightness, CP, wheezing.  Pt reports he's recently had proteinuria and was advised it may be coming from Ofev.  Pt has appt with urology tomorrow for further assessment.   76 year old male with idiopathic pulmonary fibrosis and chronic respiratory failure. He is  on nintedanib since December 2016. This is a routine follow-up. According to him and his wife overall he's stable. They feel that nintedanib helped his energy levels even though they have been counseled that it does not. He continues to use 4 L oxygen. He has never attended pulmonary rehabilitation. He is tolerating his nintedanib well. His last liver function test was in January 2017 and it was normal. Then in the interim in March 2017 had "stomach bug". He held his nintedanib for a week but now he's taking it and he is feeling well. Of note he has not attended  pulmonary fibrosis foundation support group or pulmonary rehabilitation. He is interested in research protocols.  Only new issues that he has proteinuria and is wondering if this is related to nintedanib. He is due to see a urologist soon. In terms of his lung cancer he says that he had a visit at [facility] several weeks ago and is under remission.   OV 01/07/2016  Chief Complaint  Patient presents with  . Follow-up    Pt states his breathing is unchanged since last OV. Pt c/o mild prod cough with clear mucus. Pt denies CP/tightness and f/c/s.     Follow-up idiopathic pulmonary fibrosis on 4 L oxygen with exertion 3 L at rest. Last pulmonary function test March 2016 with an FVC of 2.8 L/59% and a DLCO 7.99/22%. He has intolerance to esbriet. He has been on Ofev and was tolerating it fine but since last visit he started having more diarrhea. He is now cut it down to 150 mg once daily was still causes some mild diarrhea but it's well-controlled with Imodium. He is very interested in research protocols. Unclear to me if he attended pulmonary rehabilitation.he has postnasal drainage and is requesting Nasonex sample. He is due for liver function test today on his anti-fibrotic therapy. He is interested in trying the 100 mg twice daily of the ofev. He has been in touch with pulmonary fibrosis foundation but has not attended the meeting yet but I have encouraged him to  [place] 04/29/2016  Chief Complaint  Patient presents with  . Follow-up    3 month follow up with PFT results    Fu IPF   Idiopathic pulmonary fibrosis. He called in October 2017 and due to significant diarrhea he could not take Ofev anymore. He stopped it. He is currently off all anti-fibrotic therapy. At this point in time he is intolerant to the to approved anti-fibrotic therapies. He is feeling well. Feels dyspnea is stable. Feels IPF is stable. Walking desaturation test 185 feet 3 laps on room air: He desaturated to 79% on the  second lap and had to wear his oxygen. He did feel his dyspnea. He does not want to try any antibiotic fibrotic therapy anymore. He is open in participating in clinical trials of which we have none at this point   Allergies  Allergen Reactions  . Pirfenidone Nausea And Vomiting    OV 09/21/2016  Chief Complaint  Patient presents with  . Follow-up    Pt states he did not want to do PFT at this time. Pt denies change in breathing since last OV. Pt denies cough, CP/tightness and f/c/s.     Follow-up idiopathic pulmonary fibrosis. He ist supportive care after being intolerant to both Pirfenidone (Esbriet) and Ofev. After last visit end of 2017 he rechallenged himself with lower dose Ofev 100 mg twice daily for this also causes side effects. Especially with the GI  system. Therefore he stopped this. He is frustrated that he is not on any anti-fibrotic therapy but recognizes that he does not want any other quality-of-life side effects that both drugs post. He is open to research trials. At this point in time we do not have any but we are looking at a trial for patients on supportive care. Previously intolerant to both Pirfenidone (Esbriet) and Ofev. He might not qualify for this because of his recent lung cancer history   Results for EFREN, KROSS (MRN [id number]) as of 04/29/2016 13:47  Ref. Range 08/21/2014 13:04 04/29/2016 12:59  FVC-Pre Latest Units: L 2.80 3.28  FVC-%Pred-Pre Latest Units: % 59 71   Results for MOSIAH, BASTIN (MRN [id number]) as of 04/29/2016 13:47  Ref. Range 08/21/2014 13:04 04/29/2016 12:59  DLCO unc Latest Units: ml/min/mmHg 7.99 6.46  DLCO unc % pred Latest Units: % 22 18       has a past medical history of Allergic rhinitis; Allergic rhinitis, cause unspecified (02/28/2013); Anxiety; Back pain; Bipolar disorder ([place]); Chronic bronchitis ([place]) (02/07/2012); Complete heart block ([place]); Coronary artery disease s/p CABG 1990s; Depression; ED (erectile dysfunction); Fatigue;  GERD (gastroesophageal reflux disease); GI bleed; Hip pain, right; History of kidney stones; HTN (hypertension); Hyperlipidemia; Lumbar disc disease; Malignant neoplasm of prostate ([place]); Osteoarthritis; Peptic stricture of esophagus; Pneumonia (1993); PSA (psoriatic arthritis) ([place]); Pulmonary fibrosis, postinflammatory ([place]); PVD (peripheral vascular disease) ([place]); Shortness of breath dyspnea; and Shoulder joint pain.   reports that he quit smoking about 15 years ago. His smoking use included Cigarettes. He has a 63.00 pack-year smoking history. He has never used smokeless tobacco.  Past Surgical History:  Procedure Laterality Date  . CORONARY ANGIOPLASTY  06/03/2007, 01/25/2009  . CORONARY ARTERY BYPASS GRAFT  20 yrs ago   LIMA to LAD, SVG to PDA, SVG to Ramus, SVG to D  . CYSTOSCOPY    . LEFT AND RIGHT HEART CATHETERIZATION WITH CORONARY ANGIOGRAM N/A 07/05/2012   Procedure: LEFT AND RIGHT HEART CATHETERIZATION WITH CORONARY ANGIOGRAM;  Surgeon: Peter M Martinique, MD;  Location: [facility] CATH LAB;  Service: Cardiovascular;  Laterality: N/A;  . NASAL SEPTUM SURGERY  april 2012   dr britt, [facility] [place]  . PACEMAKER PLACEMENT  08/2011  . PERMANENT PACEMAKER INSERTION N/A 09/14/2011   Procedure: PERMANENT PACEMAKER INSERTION;  Surgeon: Deboraha Sprang, MD;  Location[facility] LAB;  Service: Cardiovascular;  Laterality: N/A;  . VIDEO BRONCHOSCOPY WITH ENDOBRONCHIAL ULTRASOUND Left 09/05/2014   Procedure: VIDEO BRONCHOSCOPY WITH ENDOBRONCHIAL ULTRASOUND;  Surgeon: Collene Gobble, MD;  Location: [facility];  Service: Thoracic;  Laterality: Left;    Allergies  Allergen Reactions  . Ofev [Nintedanib]     Diarrhea   . Pirfenidone Nausea And Vomiting    Immunization History  Administered Date(s) Administered  . Influenza Split 03/20/2011, 02/23/2012  . Influenza Whole 02/23/2000, 05/04/2007, 03/11/2010  . Influenza,inj,Quad PF,36+ Mos 02/13/2013, 04/02/2014, 02/06/2015, 04/24/2016  . Pneumococcal Conjugate-13  05/01/2014  . Pneumococcal Polysaccharide-23 08/26/2007, 02/13/2013, 04/24/2013  . Td 03/11/2010  . Tdap 02/02/2011    Family History  Problem Relation Age of Onset  . Heart attack Father   . Heart disease Father   . Colon cancer Neg Hx   . Cancer Neg Hx     Colon     Current Outpatient Prescriptions:  .  acetaminophen (TYLENOL) 500 MG tablet, Take 1,000 mg by mouth every 6 (six) hours as needed for moderate pain or headache., Disp: , Rfl:  .  albuterol (PROVENTIL HFA;VENTOLIN HFA) 108 (90 BASE) MCG/ACT inhaler, Inhale 2 puffs into the lungs every 6 (six) hours as needed for wheezing or shortness of breath., Disp: 1 Inhaler, Rfl: 11 .  ALPRAZolam (XANAX) 0.25 MG tablet, TAKE 1 TABLET BY MOUTH EVERY DAY AS NEEDED, Disp: 30 tablet, Rfl: 2 .  citalopram (CELEXA) 20 MG tablet, Take 1 tablet (20 mg total) by mouth daily., Disp: 90 tablet, Rfl: 3 .  finasteride (PROSCAR) 5 MG tablet, Take 5 mg by mouth daily. , Disp: , Rfl:  .  fluticasone (FLONASE) 50 MCG/ACT nasal spray, Place 2 sprays into the nose daily. Reported on 05/10/2015, Disp: , Rfl:  .  lisinopril (PRINIVIL,ZESTRIL) 10 MG tablet, Take 0.5 tablets (5 mg total) by mouth daily., Disp: 90 tablet, Rfl: 3 .  Magnesium 500 MG TABS, Take 1 tablet by mouth every morning., Disp: , Rfl:  .  metoprolol succinate (TOPROL-XL) 25 MG 24 hr tablet, Take 1/2 tablet (12.5 mg) by mouth twice daily, Disp: , Rfl:  .  Multiple Vitamins-Minerals (PRESERVISION AREDS PO), Take 1 tablet by mouth 2 (two) times daily., Disp: , Rfl:  .  nitroGLYCERIN (NITROSTAT) 0.4 MG SL tablet, Place 1 tablet (0.4 mg total) under the tongue every 5 (five) minutes as needed for chest pain., Disp: 90 tablet, Rfl: 3 .  omeprazole (PRILOSEC) 40 MG capsule, TAKE ONE CAPSULE BY MOUTH EVERY DAY, Disp: 90 capsule, Rfl: 3 .  OXYGEN, Inhale into the lungs. 3 lpm qhs  4 lpm pulsed with exertion as needed DME:, Disp: , Rfl:  .  simvastatin (ZOCOR) 40 MG tablet, TAKE 1 TABLET BY MOUTH AT  BEDTIME, Disp: 90 tablet, Rfl: 2 .  tamsulosin (FLOMAX) 0.4 MG CAPS capsule, Take 0.4 mg by mouth every morning. , Disp: , Rfl: 11  Review of Systems     Objective:   Physical Exam  Constitutional: He is oriented to person, place, and time. He appears well-developed and well-nourished. No distress.  92% onRA  HENT:  Head: Normocephalic and atraumatic.  Right Ear: External ear normal.  Left Ear: External ear normal.  Mouth/Throat: Oropharynx is clear and moist. No oropharyngeal exudate.  Eyes: Conjunctivae and EOM are normal. Pupils are equal, round, and reactive to light. Right eye exhibits no discharge. Left eye exhibits no discharge. No scleral icterus.  Neck: Normal range of motion. Neck supple. No JVD present. No tracheal deviation present. No thyromegaly present.  Cardiovascular: Normal rate, regular rhythm and intact distal pulses.  Exam reveals no gallop and no friction rub.   No murmur heard. Pulmonary/Chest: Effort normal. No respiratory distress. He has no wheezes. He has rales. He exhibits no tenderness.  Abdominal: Soft. Bowel sounds are normal. He exhibits no distension and no mass. There is no tenderness. There is no rebound and no guarding.  Musculoskeletal: Normal range of motion. He exhibits no edema or tenderness.  Lymphadenopathy:    He has no cervical adenopathy.  Neurological: He is alert and oriented to person, place, and time. He has normal reflexes. No cranial nerve deficit. Coordination normal.  Skin: Skin is warm and dry. No rash noted. He is not diaphoretic. No erythema. No pallor.  Psychiatric: He has a normal mood and affect. His behavior is normal. Judgment and thought content normal.  Nursing note and vitals reviewed.  Vitals:   09/21/16 1042  BP: 120/82  Pulse: 86  SpO2: 92%  Weight: 208 lb (94.3 kg)  Height: 6' (1.829 m)    Estimated body mass  index is 28.21 kg/m as calculated from the following:   Height as of this encounter: 6' (1.829 m).    Weight as of this encounter: 208 lb (94.3 kg).     Assessment:       ICD-9-CM ICD-10-CM   1. IPF (idiopathic pulmonary fibrosis) (HCC) 516.31 J84.112        Plan:     IPF (idiopathic pulmonary fibrosis) Clinically stable since last visit Too bad intolerant to both esbriet and ofev  PLAN Continue to use oxygen for exerion and be active Will consider you for upcoming research trial though recent history of lung cancer can be an exclusion Please continue to be active with PFF support group to extent possible  followup 3 months or sooner if needed   (> 50% of this 15 min visit spent in face to face counseling or/and coordination of care)  Dr. Brand Males, M.D., [facility].C.P Pulmonary and Critical Care Medicine Staff Physician [facility] Pager: [phone], If no answer or between  15:00h - 7:00h: call [phone]  09/21/2016 10:57 AM

## 2016-09-22 ENCOUNTER — Telehealth: Payer: Self-pay | Admitting: Cardiology

## 2016-09-22 ENCOUNTER — Encounter: Payer: Medicare Other | Admitting: *Deleted

## 2016-09-22 NOTE — Telephone Encounter (Signed)
LMOVM reminding pt to send remote transmission.   

## 2016-09-24 ENCOUNTER — Encounter: Payer: Self-pay | Admitting: Cardiology

## 2016-09-25 ENCOUNTER — Ambulatory Visit (INDEPENDENT_AMBULATORY_CARE_PROVIDER_SITE_OTHER): Payer: Medicare Other | Admitting: Internal Medicine

## 2016-09-25 ENCOUNTER — Encounter: Payer: Self-pay | Admitting: Internal Medicine

## 2016-09-25 ENCOUNTER — Other Ambulatory Visit: Payer: Self-pay | Admitting: Internal Medicine

## 2016-09-25 VITALS — BP 100/72 | HR 64 | Ht 72.0 in | Wt 209.8 lb

## 2016-09-25 DIAGNOSIS — J84112 Idiopathic pulmonary fibrosis: Secondary | ICD-10-CM

## 2016-09-25 DIAGNOSIS — I2510 Atherosclerotic heart disease of native coronary artery without angina pectoris: Secondary | ICD-10-CM | POA: Diagnosis not present

## 2016-09-25 DIAGNOSIS — I255 Ischemic cardiomyopathy: Secondary | ICD-10-CM | POA: Diagnosis not present

## 2016-09-25 DIAGNOSIS — E782 Mixed hyperlipidemia: Secondary | ICD-10-CM

## 2016-09-25 DIAGNOSIS — I5022 Chronic systolic (congestive) heart failure: Secondary | ICD-10-CM | POA: Diagnosis not present

## 2016-09-25 MED ORDER — NITROGLYCERIN 0.4 MG SL SUBL: 0.4000 mg | SUBLINGUAL_TABLET | SUBLINGUAL | 3 refills | Status: DC | PRN

## 2016-09-25 MED ORDER — NITROGLYCERIN 0.4 MG SL SUBL: 0.4000 mg | SUBLINGUAL_TABLET | SUBLINGUAL | 3 refills | Status: AC | PRN

## 2016-09-25 NOTE — Progress Notes (Signed)
Cardiology Office Note   Date:  09/25/2016   ID:  Wayne Torres, DOB 01-18-1941, MRN [id number]  PCP:  Tereasa Coop, PA-C  Cardiologist:   Dorris Carnes, MD   F/U of CAD    History of Present Illness: Wayne Torres is a 76 y.o. male with a history ofCAD S/P CABG remotely  Cath in 2014 LIMA to LAD patent SVG to OM and Diag patent  SVG to RCA 100%  LVEF 30%  Echo in 7/15 35 to 40%  Also history of lung CA   I saw the pt in 2017  He has been seen by Wayne Torres  Since seen he says he has  No CP  Breathign is steady  Has O2 as needed   NO PND  No edema   Appt with primeary card next wk      Outpatient Medications Prior to Visit  Medication Sig Dispense Refill  . acetaminophen (TYLENOL) 500 MG tablet Take 1,000 mg by mouth every 6 (six) hours as needed for moderate pain or headache.    . albuterol (PROVENTIL HFA;VENTOLIN HFA) 108 (90 BASE) MCG/ACT inhaler Inhale 2 puffs into the lungs every 6 (six) hours as needed for wheezing or shortness of breath. 1 Inhaler 11  . ALPRAZolam (XANAX) 0.25 MG tablet TAKE 1 TABLET BY MOUTH EVERY DAY AS NEEDED 30 tablet 2  . citalopram (CELEXA) 20 MG tablet Take 1 tablet (20 mg total) by mouth daily. 90 tablet 3  . finasteride (PROSCAR) 5 MG tablet Take 5 mg by mouth daily.     . fluticasone (FLONASE) 50 MCG/ACT nasal spray Place 2 sprays into the nose daily. Reported on 05/10/2015    . lisinopril (PRINIVIL,ZESTRIL) 10 MG tablet Take 0.5 tablets (5 mg total) by mouth daily. 90 tablet 3  . Magnesium 500 MG TABS Take 1 tablet by mouth every morning.    . metoprolol succinate (TOPROL-XL) 25 MG 24 hr tablet Take 1/2 tablet (12.5 mg) by mouth twice daily    . Multiple Vitamins-Minerals (PRESERVISION AREDS PO) Take 1 tablet by mouth 2 (two) times daily.    . nitroGLYCERIN (NITROSTAT) 0.4 MG SL tablet Place 1 tablet (0.4 mg total) under the tongue every 5 (five) minutes as needed for chest pain. 90 tablet 3  . omeprazole (PRILOSEC) 40 MG capsule TAKE ONE  CAPSULE BY MOUTH EVERY DAY 90 capsule 3  . OXYGEN Inhale into the lungs. 3 lpm qhs  4 lpm pulsed with exertion as needed DME:    . simvastatin (ZOCOR) 40 MG tablet TAKE 1 TABLET BY MOUTH AT BEDTIME 90 tablet 2  . tamsulosin (FLOMAX) 0.4 MG CAPS capsule Take 0.4 mg by mouth every morning.   11   No facility-administered medications prior to visit.      Allergies:   Ofev [nintedanib] and Pirfenidone   Past Medical History:  Diagnosis Date  . Allergic rhinitis   . Allergic rhinitis, cause unspecified 02/28/2013  . Anxiety   . Back pain   . Bipolar disorder ([place])   . Chronic bronchitis ([place]) 02/07/2012  . Complete heart block (HCC)    a. s/p SJM Accent dc ppm, ser # [id number]  . Coronary artery disease s/p CABG 1990s    a. approx 1994 s/p CABG x 4 (LIMA->LAD, VG->RCA, VG->RI, VG->Diag);  b. 11/2008 Cath: LM 100, LAD small, RCA 50-60p, 100d, VG->RCA 100 ost, VG->RI 35m VG->Diag 40 - upper branch of diag small, 99%, LIMA-LAD patent.;  c. 08/2011 Echo: EF  45-50%, mod dil LA,/RA/RV with mild-mod reduced RV fxn.  . Depression   . ED (erectile dysfunction)   . Fatigue   . GERD (gastroesophageal reflux disease)   . GI bleed   . Hip pain, right   . History of kidney stones   . HTN (hypertension)   . Hyperlipidemia   . Lumbar disc disease   . Malignant neoplasm of prostate ([place])   . Osteoarthritis   . Peptic stricture of esophagus   . Pneumonia 1993   post-op  . PSA (psoriatic arthritis) ([place])    increased  . Pulmonary fibrosis, postinflammatory ([place])    Stopped Cellcept 02/13/13 d/t cost   . PVD (peripheral vascular disease) ([place])    a. 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  bilat ext iliac dzs.  . Shortness of breath dyspnea   . Shoulder joint pain     Past Surgical History:  Procedure Laterality Date  . CORONARY ANGIOPLASTY  06/03/2007, 01/25/2009  . CORONARY ARTERY BYPASS GRAFT  20 yrs ago   LIMA to LAD, SVG to PDA, SVG to Ramus, SVG to D  .  CYSTOSCOPY    . LEFT AND RIGHT HEART CATHETERIZATION WITH CORONARY ANGIOGRAM N/A 07/05/2012   Procedure: LEFT AND RIGHT HEART CATHETERIZATION WITH CORONARY ANGIOGRAM;  Surgeon: Peter M Martinique, MD;  Location[facility] C[facility] LAB;  Service: Cardiovascular;  Laterality: N/A;  . NASAL SEPTUM SURGERY  april 2012   dr britt, [facility] - [place]  . PACEMAKER PLACEMENT  08/2011  . PERMANENT PACEMAKER INSERTION N/A 09/14/2011   Procedure: PERMANENT PACEMAKER INSERTION;  Surgeon: Deboraha Sprang, MD;  Location: [facility] CATH LAB;  Service: Cardiovascular;  Laterality: N/A;  . VIDEO BRONCHOSCOPY WITH ENDOBRONCHIAL ULTRASOUND Left 09/05/2014   Procedure: VIDEO BRONCHOSCOPY WITH ENDOBRONCHIAL ULTRASOUND;  Surgeon: Collene Gobble, MD;  Location: [place];  Service: Thoracic;  Laterality: Left;     Social History:  The patient  reports that he quit smoking about 15 years ago. His smoking use included Cigarettes. He has a 63.00 pack-year smoking history. He has never used smokeless tobacco. He reports that he does not drink alcohol or use drugs.   Family History:  The patient's family history includes Heart attack in his father; Heart disease in his father.    ROS:  Please see the history of present illness. All other systems are reviewed and  Negative to the above problem except as noted.    PHYSICAL EXAM: VS:  BP 100/72   Pulse 64   Ht 6' (1.829 m)   Wt 209 lb 12.8 oz (95.2 kg)   SpO2 97%   BMI 28.45 kg/m   GEN: Well nourished, well developed, in no acute distress HEENT: normal Neck: no JVD, carotid bruits, or masses Cardiac: RRR; no murmurs, rubs, or gallops,no edema  Respiratory:  clear to auscultation bilaterally, normal work of breathing GI: soft, nontender, nondistended, + BS  No hepatomegaly  MS: no deformity Moving all extremities   Skin: warm and dry, no rash Neuro:  Strength and sensation are intact Psych: euthymic mood, full affect   EKG:  EKG is not ordered today.    Lipid Panel    Component Value  Date/Time   CHOL 88 02/08/2015 1211   TRIG 137.0 02/08/2015 1211   HDL 27.90 (L) 02/08/2015 1211   CHOLHDL 3 02/08/2015 1211   VLDL 27.4 02/08/2015 1211   LDLCALC 33 02/08/2015 1211   LDLDIRECT 49.4 05/01/2014 1717  Wt Readings from Last 3 Encounters:  09/25/16 209 lb 12.8 oz (95.2 kg)  09/21/16 208 lb (94.3 kg)  04/29/16 215 lb (97.5 kg)      ASSESSMENT AND PLAN:  1  CAD  No symptoms of angina  Continue meds   2.  Chronic systolic CHF Volume status is good  Keep on metoprolol and lisinopril   3  HL  Continue simvistatin 4  Pulm  Hx IPF  Followes in pulmonary clinic 5  s/p PPM  I will set to see th pt in 1 year, sooner if problems  Will follow with [place], Dorris Carnes, MD  09/25/2016 [facility] [place], [place], [place]  [address] Phone: [phone]; Fax: [phone]

## 2016-09-25 NOTE — Patient Instructions (Signed)
Your physician recommends that you continue on your current medications as directed. Please refer to the Current Medication list given to you today. Your physician wants you to follow-up in: 1 year with Dr. Ross.  You will receive a reminder letter in the mail two months in advance. If you don't receive a letter, please call our office to schedule the follow-up appointment.  

## 2016-12-18 ENCOUNTER — Ambulatory Visit: Payer: Medicare Other | Admitting: Internal Medicine

## 2016-12-31 ENCOUNTER — Ambulatory Visit (INDEPENDENT_AMBULATORY_CARE_PROVIDER_SITE_OTHER): Payer: Medicare Other | Admitting: Internal Medicine

## 2016-12-31 ENCOUNTER — Telehealth: Payer: Self-pay | Admitting: Internal Medicine

## 2016-12-31 ENCOUNTER — Encounter: Payer: Self-pay | Admitting: Internal Medicine

## 2016-12-31 VITALS — BP 102/68 | HR 66 | Ht 72.0 in | Wt 209.0 lb

## 2016-12-31 DIAGNOSIS — I255 Ischemic cardiomyopathy: Secondary | ICD-10-CM | POA: Diagnosis not present

## 2016-12-31 DIAGNOSIS — J84112 Idiopathic pulmonary fibrosis: Secondary | ICD-10-CM | POA: Diagnosis not present

## 2016-12-31 DIAGNOSIS — J9611 Chronic respiratory failure with hypoxia: Secondary | ICD-10-CM

## 2016-12-31 NOTE — Patient Instructions (Signed)
ICD-10-CM   1. IPF (idiopathic pulmonary fibrosis) ([place]) J84.112   2. Chronic respiratory failure with hypoxia (HCC) J96.11     Stable disease IPF Glad you are doing well with 3L O2 at ngiht and 4L  pulse at day  Continue supportive care Glad cancer in remission Flu shot in fall Please talk to PCP [place], Caryl Pina, PA-C -  and ensure you get  shingarix vaccine Will see if you qualify for [facility] research trial for IPF  Folllowup  6 months do Pre-bd spiro and dlco only. No lung volume or bd response. No post-bd spiro REturn to see Dr Chase Caller in 6 months

## 2016-12-31 NOTE — Progress Notes (Signed)
Subjective:     Patient ID: Wayne Torres, male   DOB: [birth year], 76 y.o.   MRN: [id number]  HPI  02/06/2015 Chief Complaint  Patient presents with  . Follow-up    pt states he is doing well, his breathing is getting better. pt on O2 with activity and at night.  pulse is set at 4LM with activity. at night continuous on 3LPM. DME: [facility].     Pt was doing well until one week ago, felt poorly.  Noted constant belching. Symptoms better off esbriet .  Pt had SBRT to lung ca and f/u CT was stable.  Min cough . No hemoptysis  Pt denies any significant sore throat, nasal congestion or excess secretions, fever, chills, sweats, unintended weight loss, pleurtic or exertional chest pain, orthopnea PND, or leg swelling Pt denies any increase in rescue therapy over baseline, denies waking up needing it or having any early am or nocturnal exacerbations of coughing/wheezing/or dyspnea. Pt also denies any obvious fluctuation in symptoms with  weather or environmental change or other alleviating or aggravating factors    OV 05/10/2015  Chief Complaint  Patient presents with  . Follow-up    Former [facility] pt, seen for IPF. Pt states his breathing is unchanged since OV in Sept. Pt denies cough and CP/tightness.    Former patient of Dr. Asencion Noble has since retired from the practice. Has clinical diagnosis of idiopathic pulmonary fibrosis. In the spring 2016 was diagnosed with non-small cell lung cancer treated with local radiation at [facility] has since then had follow-up scans there. He is on several liters of oxygen. He is here with his wife. He had tried Esbriet in the past approximately spring summer 2016 but had significant GI intolerance. He recollects having had all kindsr intolerance with it. He does not want to try it again. He is interested in trying nintedanib. He does have coronary artery disease but it is not active and is not on anticoagulationwife is also reporting some fatigue for  the last few weeks to several weeks. Reports that [facility] wanted him to have CBC, chemistry, liver function test and some kind of her tumor marker. I reviewed the [facility] chart but could not find any documentation of what tumor marker they might want    06/21/2015 Follow up : IPF  Pt returns for 1 month follow up . Pt c/o increased SOB at times and prod cough with clear mucus. Feels he is at his baseline  Denies any sinus pressure/drainage, chest tightness/congestion, fever, nausea or vomtiting.   Pt is currently taking OFEV.  This was started last ov.  No n/v/d. No appetite changes.  He remains on oxygen at 3 L at rest and 4 L with walking.    Hx of NSCLC lung cancer Spring 2016 s/p SBRT  Followed at [facility] ,  CT Chest 06/11/2015 showed increase in left upper lobe nodule, similar groundglass opacities in left upper lobe. Felt likely to represent radiation changes. Unchanged right middle lobe lung nodule.    OV 09/24/2015  Chief Complaint  Patient presents with  . 3 month follow up    No changes to breathing.  Very little cough.  No chest tightness, CP, wheezing.  Pt reports he's recently had proteinuria and was advised it may be coming from Ofev.  Pt has appt with urology tomorrow for further assessment.   76 year old male with idiopathic pulmonary fibrosis and chronic respiratory failure. He is on nintedanib  since December 2016. This is a routine follow-up. According to him and his wife overall he's stable. They feel that nintedanib helped his energy levels even though they have been counseled that it does not. He continues to use 4 L oxygen. He has never attended pulmonary rehabilitation. He is tolerating his nintedanib well. His last liver function test was in January 2017 and it was normal. Then in the interim in March 2017 had "stomach bug". He held his nintedanib for a week but now he's taking it and he is feeling well. Of note he has not attended  pulmonary fibrosis foundation support group or pulmonary rehabilitation. He is interested in research protocols.  Only new issues that he has proteinuria and is wondering if this is related to nintedanib. He is due to see a urologist soon. In terms of his lung cancer he says that he had a visit at [place] several weeks ago and is under remission.   OV 01/07/2016  Chief Complaint  Patient presents with  . Follow-up    Pt states his breathing is unchanged since last OV. Pt c/o mild prod cough with clear mucus. Pt denies CP/tightness and f/c/s.     Follow-up idiopathic pulmonary fibrosis on 4 L oxygen with exertion 3 L at rest. Last pulmonary function test March 2016 with an FVC of 2.8 L/59% and a DLCO 7.99/22%. He has intolerance to esbriet. He has been on Ofev and was tolerating it fine but since last visit he started having more diarrhea. He is now cut it down to 150 mg once daily was still causes some mild diarrhea but it's well-controlled with Imodium. He is very interested in research protocols. Unclear to me if he attended pulmonary rehabilitation.he has postnasal drainage and is requesting Nasonex sample. He is due for liver function test today on his anti-fibrotic therapy. He is interested in trying the 100 mg twice daily of the ofev. He has been in touch with pulmonary fibrosis foundation but has not attended the meeting yet but I have encouraged him to  [place] 04/29/2016  Chief Complaint  Patient presents with  . Follow-up    3 month follow up with PFT results    Fu IPF   Idiopathic pulmonary fibrosis. He called in October 2017 and due to significant diarrhea he could not take Ofev anymore. He stopped it. He is currently off all anti-fibrotic therapy. At this point in time he is intolerant to the to approved anti-fibrotic therapies. He is feeling well. Feels dyspnea is stable. Feels IPF is stable. Walking desaturation test 185 feet 3 laps on room air: He desaturated to 79% on the  second lap and had to wear his oxygen. He did feel his dyspnea. He does not want to try any antibiotic fibrotic therapy anymore. He is open in participating in clinical trials of which we have none at this point   Allergies  Allergen Reactions  . Pirfenidone Nausea And Vomiting    OV 09/21/2016  Chief Complaint  Patient presents with  . Follow-up    Pt states he did not want to do PFT at this time. Pt denies change in breathing since last OV. Pt denies cough, CP/tightness and f/c/s.     Follow-up idiopathic pulmonary fibrosis. He ist supportive care after being intolerant to both Pirfenidone (Esbriet) and Ofev. After last visit end of 2017 he rechallenged himself with lower dose Ofev 100 mg twice daily for this also causes side effects. Especially with the GI system. Therefore  he stopped this. He is frustrated that he is not on any anti-fibrotic therapy but recognizes that he does not want any other quality-of-life side effects that both drugs post. He is open to research trials. At this point in time we do not have any but we are looking at a trial for patients on supportive care. Previously intolerant to both Pirfenidone (Esbriet) and Ofev. He might not qualify for this because of his recent lung cancer history   OV >td  Chief Complaint  Patient presents with  . Follow-up    Pt states his breathing is unchanged since last OV. Pt denies cough, CP/tightness and f/c/s.    Follow-up for follow-up idiopathic pulmonary fibrosis   He is on supportive care having been intolerant to both Pirfenidone (Esbriet) and Ofev. He tells me that his diagnoses of IPF within the last 3 or 4 years. He's had lung has a history for 2 years or so. Most recently he was seen in [facility] for his lung cancer follow-up. He had a CT scan in mid July 2018. I do not have the images with me but it the report shows continued evolution of radiation changes in the left upper lobe advanced  paraseptal and centrilobular emphysema with extensive peripheral fibrosis. Stable irregular 6 mm right middle lobe nodule. He's been placed in one year follow-up. There are no new issues. He is frustrated by the long wait in our office. He tells me that both at [facility] and  [place] doctors see patient on time. He is interested in research trials   Results for ABAD, MANARD (MRN [id number]) as of 04/29/2016 13:47  Ref. Range 08/21/2014 13:04 04/29/2016 12:59  FVC-Pre Latest Units: L 2.80 3.28  FVC-%Pred-Pre Latest Units: % 59 71   Results for NOAH, LEMBKE (MRN [id number]) as of 04/29/2016 13:47  Ref. Range 08/21/2014 13:04 04/29/2016 12:59  DLCO unc   Latest Units: ml/min/mmHg 7.99 6.46  DLCO unc % pred Latest Units: % 22 18     Review of Systems     Objective:   Physical Exam  Constitutional: He is oriented to person, place, and time. He appears well-developed and well-nourished. No distress.  HENT:  Head: Normocephalic and atraumatic.  Right Ear: External ear normal.  Left Ear: External ear normal.  Mouth/Throat: Oropharynx is clear and moist. No oropharyngeal exudate.  o2 on  Eyes: Pupils are equal, round, and reactive to light. Conjunctivae and EOM are normal. Right eye exhibits no discharge. Left eye exhibits no discharge. No scleral icterus.  Neck: Normal range of motion. Neck supple. No JVD present. No tracheal deviation present. No thyromegaly present.  Cardiovascular: Normal rate, regular rhythm and intact distal pulses.  Exam reveals no gallop and no friction rub.   No murmur heard. Pulmonary/Chest: Effort normal. No respiratory distress. He has no wheezes. He has rales. He exhibits no tenderness.  Abdominal: Soft. Bowel sounds are normal. He exhibits no distension and no mass. There is no tenderness. There is no rebound and no guarding.  Musculoskeletal: Normal range of motion. He exhibits no edema or tenderness.  Lymphadenopathy:    He has no cervical  adenopathy.  Neurological: He is alert and oriented to person, place, and time. He has normal reflexes. No cranial nerve deficit. Coordination normal.  Skin: Skin is warm and dry. No rash noted. He is not diaphoretic. No erythema. No pallor.  Psychiatric: He has a normal mood and affect. His behavior is normal.  Judgment and thought content normal.  Nursing note and vitals reviewed.  Vitals:   12/31/16 1238  BP: 102/68  Pulse: 66  SpO2: 92%  Weight: 209 lb (94.8 kg)  Height: 6' (1.829 m)       Assessment:       ICD-10-CM   1. IPF (idiopathic pulmonary fibrosis) ([place]) J84.112   2. Chronic respiratory failure with hypoxia (HCC) J96.11        Plan:      Stable disease IPF Glad you are doing well with 3L O2 at ngiht and 4L  pulse at day  Continue supportive care Glad cancer in remission Flu shot in fall Please talk to PCP Long, Caryl Pina, PA-C -  and ensure you get  shingarix vaccine Will see if you qualify for [facility] research trial for IPF  Folllowup  6 months do Pre-bd spiro and dlco only. No lung volume or bd response. No post-bd spiro REturn to see Dr Chase Caller in 6 months    Dr. Brand Males, M.D., [facility].C.P Pulmonary and Critical Care Medicine Staff Physician [place] [facility] Pager: [phone], If no answer or between  15:00h - 7:00h: call [phone]  12/31/2016 1:07 PM

## 2016-12-31 NOTE — Telephone Encounter (Signed)
Wayne Torres  Please let Wayne Torres know that he does not qualify for any research trial right now - not because of the lung cancer history but bcause DLCO on PFT is low. When he returns for followup,. If there is a good change in DLCO numbers then we can reconsider  Thanks  Dr. Brand Males, M.D., [facility].C.P Pulmonary and Critical Care Medicine Staff Physician [place] [facility] Pager: [phone], If no answer or between  15:00h - 7:00h: call [phone]  12/31/2016 2:40 PM

## 2017-01-05 NOTE — Telephone Encounter (Signed)
Spoke with patient. He is aware of MR's recs. Nothing else needed at time of call.

## 2017-01-05 NOTE — Telephone Encounter (Signed)
Called pt but pt was unable to come to the phone at the time of call. Spoke with pt's wife, [name], and asked her if she could have pt call us back at his earliest convenience so we could relay to him the message from Dr. Chase Caller.

## 2017-01-05 NOTE — Telephone Encounter (Signed)
Patient returned call, CB is [phone]

## 2017-02-17 ENCOUNTER — Telehealth: Payer: Self-pay | Admitting: Internal Medicine

## 2017-02-17 NOTE — Telephone Encounter (Signed)
Emily/Elise  I got a form from [employer] me to [employer] refill but my note says he is intoleratnt to both drugs and see allergies below  Allergies  Allergen Reactions  . Ofev [Nintedanib] Other (See Comments)    Diarrhea  Diarrhea   . Pirfenidone Nausea And Vomiting and Nausea Only    So, why this form?  Thanks  Dr. Brand Males, M.D., [facility].C.P Pulmonary and Critical Care Medicine Staff Physician [place] [facility] Pager: [phone], If no answer or between  15:00h - 7:00h: call [phone]  02/17/2017 6:32 PM

## 2017-02-18 NOTE — Telephone Encounter (Signed)
Ok thanks. Please take my folders from my comptuer on wheels in my room. His form is there. Plese give me to sign next time I am in clinic  Dr. Brand Males, M.D., [facility].C.P Pulmonary and Critical Care Medicine Staff Physician [place] [facility] Pager: [phone], If no answer or between  15:00h - 7:00h: call [phone]  02/18/2017 4:23 PM

## 2017-02-18 NOTE — Telephone Encounter (Signed)
Called Wayne Torres and Wayne Torres stated to me that he did begin to take the [place] again beginning two weeks ago. Stated to me that so far he has been doing good while taking the medicine and has not had any of the side effects he had the last time while taking the med.

## 2017-04-05 ENCOUNTER — Encounter: Payer: Self-pay | Admitting: Acute Care

## 2017-04-05 ENCOUNTER — Telehealth: Payer: Self-pay

## 2017-04-05 ENCOUNTER — Ambulatory Visit (HOSPITAL_BASED_OUTPATIENT_CLINIC_OR_DEPARTMENT_OTHER)
Admission: RE | Admit: 2017-04-05 | Discharge: 2017-04-05 | Disposition: A | Discharge status: Home / Self Care | Payer: Medicare Other | Source: Ambulatory Visit | Attending: Acute Care | Admitting: Acute Care

## 2017-04-05 ENCOUNTER — Ambulatory Visit (INDEPENDENT_AMBULATORY_CARE_PROVIDER_SITE_OTHER): Payer: Medicare Other | Admitting: Acute Care

## 2017-04-05 ENCOUNTER — Ambulatory Visit (INDEPENDENT_AMBULATORY_CARE_PROVIDER_SITE_OTHER)
Admission: RE | Admit: 2017-04-05 | Discharge: 2017-04-05 | Disposition: A | Discharge status: Home / Self Care | Payer: Medicare Other | Source: Ambulatory Visit | Attending: Acute Care | Admitting: Acute Care

## 2017-04-05 ENCOUNTER — Other Ambulatory Visit (INDEPENDENT_AMBULATORY_CARE_PROVIDER_SITE_OTHER): Payer: Medicare Other

## 2017-04-05 VITALS — BP 98/72 | HR 87 | Temp 94.7°F | Ht 72.0 in | Wt 208.8 lb

## 2017-04-05 DIAGNOSIS — R0600 Dyspnea, unspecified: Secondary | ICD-10-CM | POA: Diagnosis not present

## 2017-04-05 DIAGNOSIS — R0602 Shortness of breath: Secondary | ICD-10-CM | POA: Diagnosis present

## 2017-04-05 DIAGNOSIS — J9611 Chronic respiratory failure with hypoxia: Secondary | ICD-10-CM

## 2017-04-05 DIAGNOSIS — R590 Localized enlarged lymph nodes: Secondary | ICD-10-CM | POA: Diagnosis not present

## 2017-04-05 DIAGNOSIS — J8410 Pulmonary fibrosis, unspecified: Secondary | ICD-10-CM | POA: Diagnosis not present

## 2017-04-05 DIAGNOSIS — I255 Ischemic cardiomyopathy: Secondary | ICD-10-CM

## 2017-04-05 DIAGNOSIS — C3492 Malignant neoplasm of unspecified part of left bronchus or lung: Secondary | ICD-10-CM | POA: Diagnosis not present

## 2017-04-05 DIAGNOSIS — Z9889 Other specified postprocedural states: Secondary | ICD-10-CM | POA: Insufficient documentation

## 2017-04-05 DIAGNOSIS — J984 Other disorders of lung: Secondary | ICD-10-CM | POA: Insufficient documentation

## 2017-04-05 LAB — CBC WITH DIFFERENTIAL/PLATELET
BASOS ABS: 0.1 10*3/uL (ref 0.0–0.1)
Basophils Relative: 0.6 % (ref 0.0–3.0)
EOS ABS: 0.3 10*3/uL (ref 0.0–0.7)
Eosinophils Relative: 2.9 % (ref 0.0–5.0)
HCT: 51.3 % (ref 39.0–52.0)
Hemoglobin: 17 g/dL (ref 13.0–17.0)
LYMPHS ABS: 1 10*3/uL (ref 0.7–4.0)
LYMPHS PCT: 10 % — AB (ref 12.0–46.0)
MCHC: 33.1 g/dL (ref 30.0–36.0)
MCV: 99.7 fl (ref 78.0–100.0)
Monocytes Absolute: 1.2 10*3/uL — ABNORMAL HIGH (ref 0.1–1.0)
Monocytes Relative: 11.8 % (ref 3.0–12.0)
NEUTROS ABS: 7.3 10*3/uL (ref 1.4–7.7)
NEUTROS PCT: 74.7 % (ref 43.0–77.0)
PLATELETS: 273 10*3/uL (ref 150.0–400.0)
RBC: 5.15 Mil/uL (ref 4.22–5.81)
RDW: 13.6 % (ref 11.5–15.5)
WBC: 9.8 10*3/uL (ref 4.0–10.5)

## 2017-04-05 LAB — BASIC METABOLIC PANEL
BUN: 15 mg/dL (ref 6–23)
CHLORIDE: 100 meq/L (ref 96–112)
CO2: 27 mEq/L (ref 19–32)
Calcium: 9.6 mg/dL (ref 8.4–10.5)
Creatinine, Ser: 1.23 mg/dL (ref 0.40–1.50)
GFR: 60.97 mL/min (ref >60.00)
Glucose, Bld: 99 mg/dL (ref 70–99)
POTASSIUM: 3.7 meq/L (ref 3.5–5.1)
Sodium: 138 mEq/L (ref 135–145)

## 2017-04-05 LAB — D-DIMER, QUANTITATIVE (NOT AT ARMC): D DIMER QUANT: 0.61 ug{FEU}/mL — AB (ref <0.50)

## 2017-04-05 MED ORDER — IOPAMIDOL (ISOVUE-370) INJECTION 76%
100.0000 mL | Freq: Once | INTRAVENOUS | Status: AC | PRN
  Administered 2017-04-05: 80 mL via INTRAVENOUS

## 2017-04-05 NOTE — Progress Notes (Signed)
History of Present Illness Wayne Torres is a 76 y.o. male former smoker with a 63 pack year smoking history. Quit 2003, with IPF on home oxygen and with  Hx of NSCLC lung cancer Spring 2016 s/p SBRT at [facility].  Pt. Is followed by Dr. Chase Torres.  Synopsis: 76 year old male with idiopathic pulmonary fibrosis and chronic respiratory failure, SP SBRT for NSCLC, followed through [facility]. Currently on 4L with exertion and 3 L at rest. He has failed both Pirfenidone (Esbriet) and Ofev  . He has never attended pulmonary rehabilitation or pulmonary fibrosis support groups.He is interested in research protocols. He had a CT scan in mid July 2018 at [facility],  report shows continued evolution of radiation changes in the left upper lobe advanced paraseptal and centrilobular emphysema with extensive peripheral fibrosis. Stable irregular 6 mm right middle lobe nodule. He's been placed in one year follow-up.    04/05/2017 Acute OV for Worsening dyspnea: Pt. Presents for acute worsening of his SOB. He was last seen in the office August 2018 by Dr. Chase Torres. At that time per documentation, his IPF was stable, cancer was in remission,  he was wearing oxygen 3L O2 at ngiht and 4L  pulse at day. He is managed with supportive care.He was encouraged to get his flu shot in the fall and get  shingarix vaccine. Plan included determining if the patient  qualified  for the [facility] research trial for IPF, and for 6 month follow up with Dr. Chase Torres with  Pre-bd spiro and dlco only. No lung volume or bd response. No post-bd spiro.  Pt. Presents today stating he is having trouble breathing without his oxygen. He presented to the office early and was gray in color. He was on his 4 L pulsed oxygen with sats of 75%. He was immediately placed on 6 L continuous flow oxygen with saturations of 90% after 5 minutes. He is now maintaining 92% on 4 L continuous flow oxygen.  He states this worsening shortness of breath  began 2 weeks ago. He states that onset was over night with pain across his back.and shoulder blades. He states that pain has now resolved.He denies pain when he takes a deep breath, be states he has not had any recent car trips  or air travel. No leg pain or swelling. He does go without his oxygen at times despite the order to wear it continuously.He endorses + fever , no chest pain. He states he is coughing up light and dark gray secretions.Pt. States he feels fine once oxygen is placed. HR decreased from 82 to 64 after oxygen was placed.Pt was a no show for his PFT's last week. He denies chest pain, orthopnea or hemoptysis.   Test Results:ordered stat 04/05/2017>> D-Dimer 04/05/2017>> BMET 04/05/2017>> CXR   CBC Latest Ref Rng & Units 05/10/2015 02/08/2015 09/24/2014  WBC 4.0 - 10.5 K/uL 8.8 9.8 9.2  Hemoglobin 13.0 - 17.0 g/dL 17.1(H) 14.6 12.9(L)  Hematocrit 39.0 - 52.0 % 51.9 44.0 39.5  Platelets 150.0 - 400.0 K/uL 246.0 269.0 305    BMP Latest Ref Rng & Units 12/02/2015 05/10/2015 02/08/2015  Glucose 65 - 99 mg/dL 102(H) 99 105(H)  BUN 7 - 25 mg/dL _0 Creatinine 0.70 - 1.18 mg/dL 1.30(H) 1.34 1.25  Sodium 135 - 146 mmol/L 138 136 137  Potassium 3.5 - 5.3 mmol/L 4.3 4.7 4.3  Chloride 98 - 110 mmol/L 101 99 102  CO2 20 - 31 mmol/L 26 30 27  Calcium 8.6 - 10.3 mg/dL 9.1 9.7 9.1    BNP No results found for: BNP  ProBNP    Component Value Date/Time   PROBNP 94.0 06/20/2012 1708    PFT    Component Value Date/Time   FEV1PRE 2.60 04/29/2016 1259   FEV1POST 2.43 08/21/2014 1304   FVCPRE 3.28 04/29/2016 1259   FVCPOST 3.06 08/21/2014 1304   DLCOUNC 6.46 04/29/2016 1259   PREFEV1FVCRT 79 04/29/2016 1259   PSTFEV1FVCRT 80 08/21/2014 1304    No results found.   Past medical hx Past Medical History:  Diagnosis Date  . Allergic rhinitis   . Allergic rhinitis, cause unspecified 02/28/2013  . Anxiety   . Back pain   . Bipolar disorder ([place])   . Chronic bronchitis  ([place]) 02/07/2012  . Complete heart block (HCC)    a. s/p SJM [facility] ppm, ser # [id number]  . Coronary artery disease s/p CABG 1990s    a. approx 1994 s/p CABG x 4 (LIMA->LAD, VG->RCA, VG->RI, VG->Diag);  b. 11/2008 Cath: LM 100, LAD small, RCA 50-60p, 100d, VG->RCA 100 ost, VG->RI 72m VG->Diag 40 - upper branch of diag small, 99%, LIMA-LAD patent.;  c. 08/2011 Echo: EF 45-50%, mod dil LA,/RA/RV with mild-mod reduced RV fxn.  . Depression   . ED (erectile dysfunction)   . Fatigue   . GERD (gastroesophageal reflux disease)   . GI bleed   . Hip pain, right   . History of kidney stones   . HTN (hypertension)   . Hyperlipidemia   . Lumbar disc disease   . Malignant neoplasm of prostate ([place]   . Osteoarthritis   . Peptic stricture of esophagus   . Pneumonia 1993   post-op  . PSA (psoriatic arthritis) ([place]    increased  . Pulmonary fibrosis, postinflammatory ([place]    Stopped Cellcept 02/13/13 d/t cost   . PVD (peripheral vascular disease) ([place]    a. 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  bilat ext iliac dzs.  . Shortness of breath dyspnea   . Shoulder joint pain      Social History   Tobacco Use  . Smoking status: Former Smoker    Packs/day: 1.50    Years: 42.00    Pack years: 63.00    Types: Cigarettes    Last attempt to quit: 09/11/2001    Years since quitting: 15.5  . Smokeless tobacco: Never Used  . Tobacco comment: Pt does not get regular exercise  Substance Use Topics  . Alcohol use: No  . Drug use: No    [name] reports that he quit smoking about 15 years ago. His smoking use included cigarettes. He has a 63.00 pack-year smoking history. he has never used smokeless tobacco. He reports that he does not drink alcohol or use drugs.  Tobacco Cessation: Counseling given: Not Answered Comment: Pt does not get regular exercise   Past surgical hx, Family hx, Social hx all reviewed.  Current Outpatient Medications on File Prior to  Visit  Medication Sig  . acetaminophen (TYLENOL) 500 MG tablet Take 1,000 mg by mouth every 6 (six) hours as needed for moderate pain or headache.  . albuterol (PROVENTIL HFA;VENTOLIN HFA) 108 (90 BASE) MCG/ACT inhaler Inhale 2 puffs into the lungs every 6 (six) hours as needed for wheezing or shortness of breath.  . ALPRAZolam (XANAX) 0.25 MG tablet TAKE 1 TABLET BY MOUTH EVERY DAY AS NEEDED  . citalopram (CELEXA) 20 MG tablet Take 1  tablet (20 mg total) by mouth daily.  . fluticasone (FLONASE) 50 MCG/ACT nasal spray Place 2 sprays into the nose daily. Reported on 05/10/2015  . lisinopril (PRINIVIL,ZESTRIL) 10 MG tablet Take 0.5 tablets (5 mg total) by mouth daily.  . Magnesium 500 MG TABS Take 1 tablet by mouth every morning.  . metoprolol succinate (TOPROL-XL) 25 MG 24 hr tablet Take 1/2 tablet (12.5 mg) by mouth twice daily  . Multiple Vitamins-Minerals (PRESERVISION AREDS PO) Take 1 tablet by mouth 2 (two) times daily.  . nitroGLYCERIN (NITROSTAT) 0.4 MG SL tablet Place 1 tablet (0.4 mg total) under the tongue every 5 (five) minutes as needed for chest pain.  Marland Kitchen omeprazole (PRILOSEC) 40 MG capsule TAKE ONE CAPSULE BY MOUTH EVERY DAY  . OXYGEN Inhale into the lungs. 3 lpm qhs  4 lpm pulsed with exertion as needed DME:  . simvastatin (ZOCOR) 40 MG tablet TAKE 1 TABLET BY MOUTH AT BEDTIME   No current facility-administered medications on file prior to visit.      Allergies  Allergen Reactions  . Ofev [Nintedanib] Other (See Comments)    Diarrhea  Diarrhea   . Pirfenidone Nausea And Vomiting and Nausea Only    Review Of Systems:  Constitutional:   No  weight loss, night sweats,  ? Fevers, chills, fatigue, or  lassitude.  HEENT:   No headaches,  Difficulty swallowing,  Tooth/dental problems, or  Sore throat,                No sneezing, itching, ear ache, nasal congestion, post nasal drip,   CV:  No chest pain,  Orthopnea, PND, swelling in lower extremities, anasarca, dizziness,  palpitations, syncope.   GI  No heartburn, indigestion, abdominal pain, nausea, vomiting, diarrhea, change in bowel habits, loss of appetite, bloody stools.   Resp: + shortness of breath with exertion or at rest.  + excess mucus, no productive cough,  No non-productive cough,  No coughing up of blood.  + change in color of mucus.  No wheezing.  No chest wall deformity  Skin: no rash or lesions.  GU: no dysuria, change in color of urine, no urgency or frequency.  No flank pain, no hematuria   MS:  No joint pain or swelling.  No decreased range of motion.  No back pain.  Psych:  No change in mood or affect. No depression or anxiety.  No memory loss.   Vital Signs BP 98/72 (BP Location: Left Arm, Cuff Size: Normal)   Pulse 87   Temp (!) 94.7 F (34.8 C)   Ht 6' (1.829 m)   Wt 208 lb 12.8 oz (94.7 kg)   SpO2 91%   BMI 28.32 kg/m    Physical Exam:  General- No distress once oxygen at 4 L placed,  A&Ox3, pleasant ENT: No sinus tenderness, TM clear, pale nasal mucosa, no oral exudate,no post nasal drip, no LAN Cardiac: S1, S2, regular rate and rhythm, no murmur Chest: No wheeze/ rales/ dullness; no accessory muscle use, no nasal flaring, no sternal retractions, + crackles Abd.: Soft Non-tender, non-distended, BS + Ext: No clubbing cyanosis, edema Neuro:  normal strength, with some deconditioning at baseline  Skin: No rashes, warm and dry Psych: normal mood and behavior   Assessment/Plan  Chronic respiratory failure (HCC) Not wearing his oxygen 24/7 as prescribed Saturations of 75% today in office Plan: wear your oxygen 24/7 no matter what Maintain Oxygen saturation > 90% Go to the ED if any worsening  Squamous cell carcinoma of lung, stage I Follow up per [facility] for scanning  Dyspnea Dyspnea worsening from baseline x 2 weeks No recent travel or leg pain or swelling Not wearing oxygen as prescribed ? PE vs IPF flare vs COPD flare Plan: Please do not go  without your continuous oxygen . Wear your oxygen 24/7 on 4 L continuous Saturation goal is > 90%. BMET, CBC with diff and D-dimer stat now. CXR stat now. We will  We will call you with the results. If d-dimer is +, we will get a CT angio to rule out pulmonary embolism. If d-dimer is negative  We will treat with antibiotic and prednisone as a fibrosis flare. If you start to feel worse, please go to the Emergency Room.      Magdalen Spatz, NP 04/05/2017  10:57 AM

## 2017-04-05 NOTE — Assessment & Plan Note (Signed)
Follow up per [facility] for scanning

## 2017-04-05 NOTE — Telephone Encounter (Signed)
Spoke with Candy, who states pt's d-dimer is evaluated at 0.61. Per SG verbally- order CTA for today or tomorrow.  CT has been ordered. Pt is aware and voiced his understanding. Nothing further needed.

## 2017-04-05 NOTE — Assessment & Plan Note (Signed)
Not wearing his oxygen 24/7 as prescribed Saturations of 75% today in office Plan: wear your oxygen 24/7 no matter what Maintain Oxygen saturation > 90% Go to the ED if any worsening

## 2017-04-05 NOTE — Assessment & Plan Note (Addendum)
Dyspnea worsening from baseline x 2 weeks No recent travel or leg pain or swelling Not wearing oxygen as prescribed ? PE vs IPF flare vs COPD flare Plan: Please do not go without your continuous oxygen . Wear your oxygen 24/7 on 4 L continuous Saturation goal is > 90%. BMET, CBC with diff and D-dimer stat now. CXR stat now. We will  We will call you with the results. If d-dimer is +, we will get a CT angio to rule out pulmonary embolism. If d-dimer is negative  We will treat with antibiotic and prednisone as a fibrosis flare. If you start to feel worse, please go to the Emergency Room.

## 2017-04-05 NOTE — Patient Instructions (Addendum)
It is nice to meet you today. We need to determine if your worsening shortness of breath is due to worsening IPF or a pulmonary embolism. Please do not go without your continuous oxygen . Wear your oxygen 24/7 on 4 L continuous Saturation goal is > 90%. BMET, CBC with diff and D-dimer stat now. CXR stat now. We will  We will call you with the results. If d-dimer is +, we will get a CT angio. If d-dimer is negative  We will treat with antibiotic and prednisone as a fibrosis flare. If you start to feel worse, please go to the Emergency Room.  Follow up in 2 weeks with Dr. Chase Caller or Judson Roch NP Please contact office for sooner follow up if symptoms do not improve or worsen or seek emergency care

## 2017-04-06 ENCOUNTER — Telehealth: Payer: Self-pay | Admitting: Internal Medicine

## 2017-04-06 ENCOUNTER — Telehealth: Payer: Self-pay | Admitting: Acute Care

## 2017-04-06 DIAGNOSIS — J84112 Idiopathic pulmonary fibrosis: Secondary | ICD-10-CM

## 2017-04-06 MED ORDER — DOXYCYCLINE HYCLATE 100 MG PO TABS: 100.0000 mg | ORAL_TABLET | Freq: Two times a day (BID) | ORAL | 0 refills | Status: DC

## 2017-04-06 MED ORDER — PREDNISONE 10 MG PO TABS: ORAL_TABLET | ORAL | 0 refills | Status: DC

## 2017-04-06 NOTE — Telephone Encounter (Signed)
Magdalen Spatz, NP     04/06/17 1:26 PM  Note    Please let patient  know his CT angio was negative for PE. We will treat his  shortness of breath as a flare of his fibrosis. Please place order for Doxycycline 100 mg BID x 7 days and prednisone taper Prednisone taper; 10 mg tablets: 4 tabs x 3 days, 3 tabs x 3 days, 2 tabs x 3 days 1 tab x 3 days then stop. Schedule for a 2 week follow up with Ramaswamy. If nothing available with  [facility]  Then schedule with me.   Please remind him he needs to wear his oxygen 24/7.  Even if he feels he does not need to wear it. Remind him that the pulsed is not adequate during this flare. Ask him to follow up with cards. He has been going without his oxygen and I am concerned about the impact it has had on his heart. I discussed this with him in the office 11/12. Thanks     Answered pt's questions about ABX and d-dimer results. Nothing further is needed.

## 2017-04-06 NOTE — Telephone Encounter (Signed)
Spoke with pt, aware of results/recs.  rx's sent to preferred pharmacy.  Pt already scheduled for 2 week rov.  Nothing further needed.

## 2017-04-06 NOTE — Telephone Encounter (Signed)
Pt requesting results of yesterday's CTA. SG please advise.  Thanks!

## 2017-04-06 NOTE — Telephone Encounter (Signed)
Magdalen Spatz, NP      04/06/17 1:26 PM  Note    Please let patient  know his CT angio was negative for PE. We will treat his  shortness of breath as a flare of his fibrosis. Please place order for Doxycycline 100 mg BID x 7 days and prednisone taper Prednisone taper; 10 mg tablets: 4 tabs x 3 days, 3 tabs x 3 days, 2 tabs x 3 days 1 tab x 3 days then stop. Schedule for a 2 week follow up with Ramaswamy. If nothing available with  [facility]  Then schedule with me.   Please remind him he needs to wear his oxygen 24/7.  Even if he feels he does not need to wear it. Remind him that the pulsed is not adequate during this flare. Ask him to follow up with cards. He has been going without his oxygen and I am concerned about the impact it has had on his heart. I discussed this with him in the office 11/12. Thanks      Dr Harrington Challenger, pt called into the office to schedule an appt with you or your PA, to follow-up on his cardiac status, for he went a period without his 26 and Eric Form NP wanted cards to see if this made any impact on his heart.  Above is Sarah NP with Pulmonology's note on the pt and why she advised him to schedule an appt here at our office.  Pt is scheduled tomorrow 04/07/17 at 0930 with Vin PA-C.  Pt just wants to pass this message along to you as an FYI.  Informed the pt that I will route this message to both Dr Harrington Challenger and covering RN as an Juluis Rainier.  Pt verbalized understanding and agrees with this plan.

## 2017-04-06 NOTE — Telephone Encounter (Signed)
Spoke with pt, he states he wants to switch DME company and needs his oxygen order sent to [facility]. Can we place order?

## 2017-04-06 NOTE — Telephone Encounter (Signed)
Attempted to call daughter, left message to call back.Magdalen Spatz, NP     04/06/17 1:26 PM  Note    Please let patient know his CT angio was negative for PE. We will treat his shortness of breath as a flare of his fibrosis. Please place order for Doxycycline 100 mg BID x 7 days and prednisone taper Prednisone taper; 10 mg tablets: 4 tabs x3days, 3 tabs x 3days, 2 tabs x 3days 1 tab x 3days then stop. Schedule for a 2 week follow up with Ramaswamy. If nothing available with [facility] Then schedule with me.   Please remind him he needs to wear his oxygen 24/7. Even if he feels he does not need to wear it. Remind him that the pulsed is not adequate during this flare. Ask him to follow up with cards. He has been going without his oxygen and I am concerned about the impact it has had on his heart. I discussed this with him in the office 11/12. Thanks     Answered pt's questions about ABX and d-dimer results. Nothing further is needed.

## 2017-04-06 NOTE — Telephone Encounter (Signed)
Yes go ahead with change  Dr. Brand Males, M.D., [facility].C.P Pulmonary and Critical Care Medicine Staff Physician [facility] Pager: [phone], If no answer or between  15:00h - 7:00h: call [phone]  04/06/2017 4:20 PM

## 2017-04-06 NOTE — Telephone Encounter (Signed)
Please let patient  know his CT angio was negative for PE. We will treat his  shortness of breath as a flare of his fibrosis. Please place order for Doxycycline 100 mg BID x 7 days and prednisone taper Prednisone taper; 10 mg tablets: 4 tabs x 3 days, 3 tabs x 3 days, 2 tabs x 3 days 1 tab x 3 days then stop. Schedule for a 2 week follow up with Ramaswamy. If nothing available with  [facility]  Then schedule with me.   Please remind him he needs to wear his oxygen 24/7.  Even if he feels he does not need to wear it. Remind him that the pulsed is not adequate during this flare. Ask him to follow up with cards. He has been going without his oxygen and I am concerned about the impact it has had on his heart. I discussed this with him in the office 11/12. Thanks

## 2017-04-06 NOTE — Progress Notes (Signed)
Cardiology Office Note    Date:  04/07/2017   ID:  [name], DOB 01/04/1941, MRN [id number]  PCP:  [name], PA-C  Cardiologist:  Dr. Harrington Challenger Electrophysiologist: Dr. Caryl Comes   Chief Complaint: Dyspnea   History of Present Illness:   Wayne Torres is a 76 y.o. male with hx of 22 back year tobacco smoking (quit in 2003), lung cancer in 2016 s/p SBRT at [facility] in remission, pulmonary fibrosis on 24/7 oxygen (followed by Dr. Chase Caller), CAD s/p CABG remotely, CHB s/p PPM, ICM, chronic systolic CHF, PVD, HTN and HLD presents for dyspnea.  Last cath in 2014 showed LIMA to LAD patent, SVG to OM and Diag patent,  SVG to RCA 100% , LVEF 30%. Last echo 03/2015 showed LVEF of 35-40% (no change).  Last seen by Dr. Harrington Challenger 09/2016.   Senn by pulmonary APP 04/05/17 for worsening dyspnea x 2 weeks. D-dimer minimally elevated. Follow up CT angio of chest showed no PE. Finding consistent with pulmonary fibrosis. Started on abx and taper steroids for fibrosis flare up.   The patient is here for evaluation of dyspnea.  A few weeks ago he tried to move some boxes and noted Mild chest discomfort and shortness of breath.  Resolves with rest.  No recurrence since then.  Patient continues to have shortness of breath with exertion however feels good when he is on 4 L of oxygen.  No chest tightness/pain with ambulation.  He denies orthopnea, PND, syncope, lower extremity edema or melena.  He does complains of intermittent dizziness and blurry vision while trying to stand up.  Wife and patient does not want to start antibiotic and steroid until cardiac evaluation.  Current symptoms hard to differentiate from prior angina episode. Patient is not a good historian.    Past Medical History:  Diagnosis Date  . Allergic rhinitis   . Allergic rhinitis, cause unspecified 02/28/2013  . Anxiety   . Back pain   . Bipolar disorder ([place])   . Chronic bronchitis ([place]) 02/07/2012  . Complete heart block (HCC)    a. s/p SJM Accent dc ppm, ser # [id number]  . Coronary artery disease s/p CABG 1990s    a. approx 1994 s/p CABG x 4 (LIMA->LAD, VG->RCA, VG->RI, VG->Diag);  b. 11/2008 Cath: LM 100, LAD small, RCA 50-60p, 100d, VG->RCA 100 ost, VG->RI 27m VG->Diag 40 - upper branch of diag small, 99%, LIMA-LAD patent.;  c. 08/2011 Echo: EF 45-50%, mod dil LA,/RA/RV with mild-mod reduced RV fxn.  . Depression   . ED (erectile dysfunction)   . Fatigue   . GERD (gastroesophageal reflux disease)   . GI bleed   . Hip pain, right   . History of kidney stones   . HTN (hypertension)   . Hyperlipidemia   . Lumbar disc disease   . Malignant neoplasm of prostate ([place]   . Osteoarthritis   . Peptic stricture of esophagus   . Pneumonia 1993   post-op  . PSA (psoriatic arthritis) ([place]    increased  . Pulmonary fibrosis, postinflammatory ([place]    Stopped Cellcept 02/13/13 d/t cost   . PVD (peripheral vascular disease) ([place]    a. 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  bilat ext iliac dzs.  . Shortness of breath dyspnea   . Shoulder joint pain     Past Surgical History:  Procedure Laterality Date  . CORONARY ANGIOPLASTY  06/03/2007, 01/25/2009  . CORONARY ARTERY BYPASS  GRAFT  20 yrs ago   LIMA to LAD, SVG to PDA, SVG to Ramus, SVG to D  . CYSTOSCOPY    . NASAL SEPTUM SURGERY  april 2012   dr britt, [facility] [place]  . PACEMAKER PLACEMENT  08/2011    Current Medications: Prior to Admission medications   Medication Sig Start Date End Date Taking? Authorizing Provider  acetaminophen (TYLENOL) 500 MG tablet Take 1,000 mg by mouth every 6 (six) hours as needed for moderate pain or headache.    [provider]  albuterol (PROVENTIL HFA;VENTOLIN HFA) 108 (90 BASE) MCG/ACT inhaler Inhale 2 puffs into the lungs every 6 (six) hours as needed for wheezing or shortness of breath. 09/19/14   Elsie Stain, MD  ALPRAZolam Duanne Moron) 0.25 MG tablet TAKE 1 TABLET BY MOUTH EVERY DAY  AS NEEDED 05/03/15   Biagio Borg, MD  citalopram (CELEXA) 20 MG tablet Take 1 tablet (20 mg total) by mouth daily. 07/01/15   Biagio Borg, MD  doxycycline (VIBRA-TABS) 100 MG tablet Take 1 tablet (100 mg total) 2 (two) times daily by mouth. 04/06/17   Magdalen Spatz, NP  fluticasone (FLONASE) 50 MCG/ACT nasal spray Place 2 sprays into the nose daily. Reported on 05/10/2015    [provider]  lisinopril (PRINIVIL,ZESTRIL) 10 MG tablet Take 0.5 tablets (5 mg total) by mouth daily. 05/01/14   Biagio Borg, MD  Magnesium 500 MG TABS Take 1 tablet by mouth every morning.    [provider]  metoprolol succinate (TOPROL-XL) 25 MG 24 hr tablet Take 1/2 tablet (12.5 mg) by mouth twice daily    [provider]  Multiple Vitamins-Minerals (PRESERVISION AREDS PO) Take 1 tablet by mouth 2 (two) times daily.    [provider]  nitroGLYCERIN (NITROSTAT) 0.4 MG SL tablet Place 1 tablet (0.4 mg total) under the tongue every 5 (five) minutes as needed for chest pain. 09/25/16   [name], MD  omeprazole (PRILOSEC) 40 MG capsule TAKE ONE CAPSULE BY MOUTH EVERY DAY 06/25/14   Biagio Borg, MD  OXYGEN Inhale into the lungs. 3 lpm qhs  4 lpm pulsed with exertion as needed DME:    [provider]  predniSONE (DELTASONE) 10 MG tablet 55mX3 days, 372mX3 days, 205m3 days, 50m61mdays, then stop. 04/06/17   GrocMagdalen Spatz  simvastatin (ZOCOR) 40 MG tablet TAKE 1 TABLET BY MOUTH AT BEDTIME 09/30/15   JohnBiagio Borg    Allergies:   Ofev [nintedanib] and Pirfenidone   Social History   Socioeconomic History  . Marital status: Married    Spouse name: Not on file  . Number of children: 2  . Years of education: Not on file  . Highest education level: Not on file  Social Needs  . Financial resource strain: Not on file  . Food insecurity - worry: Not on file  . Food insecurity - inability: Not on file  . Transportation needs - medical: Not on file  .  Transportation needs - non-medical: Not on file  Occupational History  . Occupation: Retired  Tobacco Use  . Smoking status: Former Smoker    Packs/day: 1.50    Years: 42.00    Pack years: 63.00    Types: Cigarettes    Last attempt to quit: 09/11/2001    Years since quitting: 15.5  . Smokeless tobacco: Never Used  . Tobacco comment: Pt does not get regular exercise  Substance and  Sexual Activity  . Alcohol use: No  . Drug use: No  . Sexual activity: Yes  Other Topics Concern  . 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   +Hx of smoking, quit about 2003.     Family History:  The patient's family history includes Heart attack in his father; Heart disease in his father.   ROS:   Please see the history of present illness.    ROS All other systems reviewed and are negative.   PHYSICAL EXAM:   VS:  BP 98/64   Pulse 64   Ht 6' (1.829 m)   Wt 210 lb (95.3 kg)   BMI 28.48 kg/m    GEN: Well nourished, well developed, in no acute distress on 4 L oxygen HEENT: normal  Neck: no JVD, carotid bruits, or masses Cardiac: RRR; no murmurs, rubs, or gallops,no edema  Respiratory: Diminished breath sounds at bases  GI: soft, nontender, nondistended, + BS MS: no deformity or atrophy  Skin: warm and dry, no rash Neuro:  Alert and Oriented x 3, Strength and sensation are intact Psych: euthymic mood, full affect  Wt Readings from Last 3 Encounters:  04/07/17 210 lb (95.3 kg)  04/05/17 208 lb 12.8 oz (94.7 kg)  12/31/16 209 lb (94.8 kg)      Studies/Labs Reviewed:   EKG:  EKG is not ordered today.    Recent Labs: 04/05/2017: BUN 15; Creatinine, Ser 1.23; Hemoglobin 17.0; Platelets 273.0; Potassium 3.7; Sodium 138   Lipid Panel    Component Value Date/Time   CHOL 88 02/08/2015 1211   TRIG 137.0 02/08/2015 1211   HDL 27.90 (L) 02/08/2015 1211   CHOLHDL 3 02/08/2015 1211   VLDL 27.4 02/08/2015  1211   LDLCALC 33 02/08/2015 1211   LDLDIRECT 49.4 05/01/2014 1717    Additional studies/ records that were reviewed today include:   Echocardiogram: 03/2015 Study Conclusions  - Left ventricle: There is hypokinesis in the basal and mid   inferior, inferolateral wall and in the apical inferior wall. The   cavity size was moderately dilated. Wall thickness was normal.   Systolic function was moderately reduced. The estimated ejection   fraction was in the range of 35% to 40%. Wall motion was normal;   there were no regional wall motion abnormalities. The study was   not technically sufficient to allow evaluation of LV diastolic   dysfunction due to atrial fibrillation. - Aortic valve: Structurally normal valve. Transvalvular velocity   was within the normal range. There was no stenosis. There was no   regurgitation. - Mitral valve: Structurally normal valve. There was no   regurgitation. - Left atrium: The atrium was moderately dilated. - Right ventricle: Systolic function was normal. - Tricuspid valve: There was trivial regurgitation. - Pulmonic valve: There was no regurgitation. - Pulmonary arteries: Systolic pressure was within the normal   range.  Impressions:  - When compared to the prior study from 12/20/2013 there is no   significant change. LVEF remains moderately decreased.  Cardiac Catheterization: 06/2012 Coronary angiography: Coronary dominance: right  Left mainstem: 99% mid left main stenosis.  Left anterior descending (LAD): 100% occlusion at the ostium.  Left circumflex (LCx): 100% occlusion proximally.  Right coronary artery (RCA): The right coronary is a dominant vessel. It has 50-60% stenosis in the proximal vessel followed by 50-60% segmental disease in the mid vessel. The PL OM is occluded proximally. The  PDA is without significant disease. There are left to right and right to right collaterals to the PL OM.  The saphenous vein graft to the  first diagonal is patent. There is 20-30% disease in the proximal vein graft. After the graft touchdown there is a 90% stenosis in the mid to distal diagonal. The vessel is very small in caliber in this location.  The saphenous vein graft to the obtuse marginal vessel is patent. There is 40-50% stenosis in the proximal vein graft. There is aneurysmal dilatation in the distal vein graft. The obtuse marginal vessel is fairly small.  The saphenous vein graft to the posterior lateral branch of the right coronary is occluded proximally.  The LIMA graft to the LAD is patent  Left ventriculography: Left ventricular systolic function is abnormal. Left ventricle is dilated with inferior wall akinesis and severe global hypokinesis. Ejection fraction is estimated at 30% there is no significant mitral regurgitation   Final Conclusions:   1. Severe three-vessel obstructive coronary disease. 2. Occluded saphenous vein graft to the PL OM. 3. Other grafts are patent including LIMA graft to the LAD, saphenous vein graft to the diagonal, and saphenous vein graft to the obtuse marginal vessel. 4. Severe left ventricular dysfunction. 5. Normal right heart pressures.  Recommendations: Recommend continued medical management. Compared to his prior study in 2010 there is no significant change in his coronary circulation. His LV function is decreased.     ASSESSMENT & PLAN:    1.  Dyspnea on exertion  -No PE on recent CTA of the chest.  His symptoms has been stable for past 2-3 weeks.  Hard to differentiate if his symptoms is from angina or not.  Will proceed with Lexiscan Myoview to rule out any ischemia.  Less suspicious for CHF exacerbation given euvolemic on exam and lack of orthopnea and PND. Wife and patient will discussed abx and steroid initiation (for fibrosis flare up) with pulmonary.   2. CAD s/p remote CABG - Last cath in 2014 showed LIMA to LAD patent, SVG to OM and Diag patent,  SVG to RCA  100% , LVEF 30%.  -As discuss above. He is note taking ASA for many years. Unable to specify the reason. However, remote hx of GI ulcer. Will restart ASA 54m. Continue PPI.   3. ICM/chronic systolic CHF  - Last echo 03/2015 showed LVEF of 35-40% (no change).  Euvolemic.  Continue lisinopril and Toprol.  4. HTN/ Intermittent dizzienss - Noted soft low BP today. He has intermittent dizziness with standing up. Not orthostatic by vitals. Given soft low BP will reduce lisinopril to 2.571mqd. Continue BB at current dose  5. HLD - No results found for requested labs within last 8760 hours. Continue statin.   6. CHB s/p [employer]Jude pacemaker   Medication Adjustments/Labs and Tests Ordered: Current medicines are reviewed at length with the patient today.  Concerns regarding medicines are outlined above.  Medication changes, Labs and Tests ordered today are listed in the Patient Instructions below. Patient Instructions  Medication Instructions:  1. START ASPIRIN 81 MG DAILY  2. DECREASE LISINOPRIL TO 2.5 MG DAILY; NEW RX HAS BEEN SENT IN  Labwork: NONE ORDERED TODAY  Testing/Procedures: Your physician has requested that you have a lexiscan myoview. For further information please visit [email or web address] follow instruction sheet, as given.    Follow-Up: DR. ROHarrington ChallengerN 1 MONTH  Any Other Special Instructions Will Be Listed Below (If Applicable).     If you need a  refill on your cardiac medications before your next appointment, please call your pharmacy.      Jarrett Soho, [place]  04/07/2017 12:03 PM    [address], [place], [place]  [address] Phone: [phone]; Fax: [phone]

## 2017-04-06 NOTE — Telephone Encounter (Signed)
Patient calling, states that he saw his doctor and was told that he may have a pulmonary flare and that he might have a blockage in his lung.  Patient is scheduled to see Vin 04-07-17.

## 2017-04-06 NOTE — Telephone Encounter (Signed)
lmtcb x1 for pt's daughter, Wynona Canes. Pt will need an OV and qualifying walk on the same due to having Medicare before change can be made.

## 2017-04-07 ENCOUNTER — Ambulatory Visit (INDEPENDENT_AMBULATORY_CARE_PROVIDER_SITE_OTHER): Payer: Medicare Other | Admitting: Physician Assistant

## 2017-04-07 VITALS — BP 98/64 | HR 64 | Ht 72.0 in | Wt 210.0 lb

## 2017-04-07 DIAGNOSIS — I255 Ischemic cardiomyopathy: Secondary | ICD-10-CM

## 2017-04-07 DIAGNOSIS — Z951 Presence of aortocoronary bypass graft: Secondary | ICD-10-CM

## 2017-04-07 DIAGNOSIS — I442 Atrioventricular block, complete: Secondary | ICD-10-CM

## 2017-04-07 DIAGNOSIS — I5022 Chronic systolic (congestive) heart failure: Secondary | ICD-10-CM

## 2017-04-07 DIAGNOSIS — E785 Hyperlipidemia, unspecified: Secondary | ICD-10-CM

## 2017-04-07 DIAGNOSIS — I2589 Other forms of chronic ischemic heart disease: Secondary | ICD-10-CM

## 2017-04-07 DIAGNOSIS — R0600 Dyspnea, unspecified: Secondary | ICD-10-CM | POA: Diagnosis not present

## 2017-04-07 MED ORDER — LISINOPRIL 2.5 MG PO TABS: 2.5000 mg | ORAL_TABLET | Freq: Every day | ORAL | 3 refills | Status: DC

## 2017-04-07 MED ORDER — ASPIRIN EC 81 MG PO TBEC: 81.0000 mg | DELAYED_RELEASE_TABLET | Freq: Every day | ORAL | Status: AC

## 2017-04-07 NOTE — Telephone Encounter (Signed)
Randa Spike, CMA      4:32 PM  Note    lmtcb x1 for pt's daughter, Wynona Canes. Pt will need an OV and qualifying walk on the same due to having Medicare before     Spoke with pt's daughter-she needs an order for rolling cart, needs larger tank, POC, and oxygen concentrator for the home. She states [employer] will not give him anymore tanks and he is running low. She would like a call back today.   SG ok to sign on all of the orders?

## 2017-04-07 NOTE — Telephone Encounter (Signed)
lmtcb

## 2017-04-07 NOTE — Telephone Encounter (Signed)
LMTCB for [employer]

## 2017-04-07 NOTE — Patient Instructions (Signed)
Medication Instructions:  1. START ASPIRIN 81 MG DAILY  2. DECREASE LISINOPRIL TO 2.5 MG DAILY; NEW RX HAS BEEN SENT IN  Labwork: NONE ORDERED TODAY  Testing/Procedures: Your physician has requested that you have a lexiscan myoview. For further information please visit [email or web address]. Please follow instruction sheet, as given.    Follow-Up: DR. Harrington Challenger IN 1 MONTH  Any Other Special Instructions Will Be Listed Below (If Applicable).     If you need a refill on your cardiac medications before your next appointment, please call your pharmacy.

## 2017-04-07 NOTE — Telephone Encounter (Signed)
Vinnie Level returned call, CB is [phone].

## 2017-04-08 ENCOUNTER — Telehealth (HOSPITAL_COMMUNITY): Payer: Self-pay | Admitting: *Deleted

## 2017-04-08 NOTE — Telephone Encounter (Signed)
Patient given detailed instructions per Myocardial Perfusion Study Information Sheet for the test on 04/14/17. Patient notified to arrive 15 minutes early and that it is imperative to arrive on time for appointment to keep from having the test rescheduled.  If you need to cancel or reschedule your appointment, please call the office within 24 hours of your appointment. . Patient verbalized understanding. Kirstie Peri

## 2017-04-08 NOTE — Telephone Encounter (Signed)
Spoke with pt's daughter, Wayne Torres ([place]). Wayne Torres states pt is currently on 4L O2. Wayne Torres states pt is currently with [employer]. Wayne Torres states she spoke with [employer] in regards to getting an extra portable tank with rolling chart in addition to the tank and carrier that pt currently has. Wayne Torres states she was advised by [employer] that medicare will  pay for either the rolling chart or carrying that pt currently has not both. Wayne Torres states per [employer], order is needed.  Order has been placed to [facility].  Nothing further needed.

## 2017-04-08 NOTE — Telephone Encounter (Signed)
Thanks so much Margie.

## 2017-04-08 NOTE — Telephone Encounter (Signed)
Vinnie Level is calling back, CB is [phone].  States she spoke with [employer] around 5:30 on 11/14 pm and is requesting to speak to nurse about what they told her.

## 2017-04-09 NOTE — Telephone Encounter (Signed)
Patient already has an appt with SG on 11/26.  LMTCB x3

## 2017-04-13 NOTE — Telephone Encounter (Signed)
Note made on appt to obtain qualifying walk at this ov.  Will close encounter.

## 2017-04-14 ENCOUNTER — Ambulatory Visit (HOSPITAL_COMMUNITY): Payer: Medicare Other | Attending: Internal Medicine

## 2017-04-14 DIAGNOSIS — R0600 Dyspnea, unspecified: Secondary | ICD-10-CM | POA: Insufficient documentation

## 2017-04-14 DIAGNOSIS — R9439 Abnormal result of other cardiovascular function study: Secondary | ICD-10-CM | POA: Insufficient documentation

## 2017-04-14 DIAGNOSIS — Z951 Presence of aortocoronary bypass graft: Secondary | ICD-10-CM | POA: Insufficient documentation

## 2017-04-14 LAB — MYOCARDIAL PERFUSION IMAGING
LVDIAVOL: 166 mL (ref 62–150)
LVSYSVOL: 119 mL
Peak HR: 69 {beats}/min
RATE: 0.4
Rest HR: 64 {beats}/min
SDS: 0
SRS: 18
SSS: 18
TID: 0.94

## 2017-04-14 MED ORDER — REGADENOSON 0.4 MG/5ML IV SOLN
0.4000 mg | Freq: Once | INTRAVENOUS | Status: AC
  Administered 2017-04-14: 0.4 mg via INTRAVENOUS

## 2017-04-14 MED ORDER — TECHNETIUM TC 99M TETROFOSMIN IV KIT
31.2000 | PACK | Freq: Once | INTRAVENOUS | Status: AC | PRN
  Administered 2017-04-14: 31.2 via INTRAVENOUS
  Filled 2017-04-14: qty 32

## 2017-04-14 MED ORDER — TECHNETIUM TC 99M TETROFOSMIN IV KIT
10.3000 | PACK | Freq: Once | INTRAVENOUS | Status: AC | PRN
  Administered 2017-04-14: 10.3 via INTRAVENOUS
  Filled 2017-04-14: qty 11

## 2017-04-19 ENCOUNTER — Encounter: Payer: Self-pay | Admitting: Acute Care

## 2017-04-19 ENCOUNTER — Ambulatory Visit (INDEPENDENT_AMBULATORY_CARE_PROVIDER_SITE_OTHER): Payer: Medicare Other | Admitting: Acute Care

## 2017-04-19 ENCOUNTER — Telehealth: Payer: Self-pay | Admitting: Acute Care

## 2017-04-19 DIAGNOSIS — J0110 Acute frontal sinusitis, unspecified: Secondary | ICD-10-CM

## 2017-04-19 DIAGNOSIS — I255 Ischemic cardiomyopathy: Secondary | ICD-10-CM

## 2017-04-19 DIAGNOSIS — C3492 Malignant neoplasm of unspecified part of left bronchus or lung: Secondary | ICD-10-CM

## 2017-04-19 DIAGNOSIS — J84112 Idiopathic pulmonary fibrosis: Secondary | ICD-10-CM | POA: Diagnosis not present

## 2017-04-19 DIAGNOSIS — R591 Generalized enlarged lymph nodes: Secondary | ICD-10-CM

## 2017-04-19 MED ORDER — AMOXICILLIN-POT CLAVULANATE 875-125 MG PO TABS: 1.0000 | ORAL_TABLET | Freq: Two times a day (BID) | ORAL | 0 refills | Status: DC

## 2017-04-19 NOTE — Patient Instructions (Addendum)
It is good to see you today. Continue wearing oxygen continuously to maintain your oxygen saturations 92-94% We will walk you today. Augmentin 875 twice daily x 7 days for sinusitis. Probiotic while on antibiotic. Follow up as needed for sinusitis. May use Chlor-trimeton 13m, 2 tablets  at bedtime as needed for post nasal drainage Follow up with Dr. RChase Callerin February 2019 for IPF. Please contact office for sooner follow up if symptoms do not improve or worsen or seek emergency care

## 2017-04-19 NOTE — Telephone Encounter (Signed)
Please call patient and let him know he will need a follow up CT scan in 3-6 months based on the CT chest he had in November  . Please schedule it for March of 2019. CT chest without. ( Follow up right mediastinal and mild right hilar lymphadenopathy) .Thanks so much.

## 2017-04-19 NOTE — Progress Notes (Signed)
History of Present Illness Wayne Torres is a 76 y.o. male former smoker with a 63 pack year smoking history. Quit 2003, with IPF on home oxygen and with  Hx of NSCLC lung cancer Spring 2016 s/p SBRT at [facility].  Pt. Is followed by Dr. Chase Caller.  Synopsis 76 year old male with idiopathic pulmonary fibrosis and chronic respiratory failure, SP SBRT for NSCLC, followed through [facility]. Currently on 4L with exertion and 3 L at rest. He has failed both Pirfenidone (Esbriet) and Ofev  . He has never attended pulmonary rehabilitation or pulmonary fibrosis support groups.He is interested in research protocols. He had a CT scan in mid July 2018 at [facility],  report shows continued evolution of radiation changes in the left upper lobe advanced paraseptal and centrilobular emphysema with extensive peripheral fibrosis. Stable irregular 6 mm right middle lobe nodule. He's been placed in one year follow-up.     04/19/2017: Pt is here for qualifying walk for oxygen . Pt. Presents for follow up. He states he is doing well.He was seen 11/12 for dyspnea with IPF flare. D dimer at the time was elevated and BLE dopplers were negative.He was non-compliant with his oxygen at the time.  He was treated with prednisone and Doxycycline and instructed to use his oxygen 24/7 and at a higher flow rate to maintain oxygen saturations > 90%. He completed his medication.He states he is much better. He noted his oxygen saturations are better since using continuous flow oxygen at a higher flow rate and maintaining sats > 90%. He is compliant with his oxygen treatment. He is currently wearing his oxygen ( after being non-compliant). He is wearing his O2 at 3 L currently. He is complaining of sinusitis.He has pain and tenderness with nasal discharge that is thick and  yellow with bloody discharge. He states he is having fevers at night.He denies chest pain, orthopnea or hemoptysis.   Test results: Test Results:ordered  stat 04/05/2017>> D-Dimer >> 0.61 04/05/2017>> BMET>>WNL 04/05/2017>> CXR>>Cardiomegaly. Areas consistent with interstitial pulmonary fibrosis remain stable. No acute findings. 04/05/2017>> CT Angio>> Negative for PE    CBC Latest Ref Rng & Units 04/05/2017 05/10/2015 02/08/2015  WBC 4.0 - 10.5 K/uL 9.8 8.8 9.8  Hemoglobin 13.0 - 17.0 g/dL 17.0 17.1(H) 14.6  Hematocrit 39.0 - 52.0 % 51.3 51.9 44.0  Platelets 150.0 - 400.0 K/uL 273.0 246.0 269.0    BMP Latest Ref Rng & Units 04/05/2017 12/02/2015 05/10/2015  Glucose 70 - 99 mg/dL 99 102(H) 99  BUN 6 - 23 mg/dL _0 Creatinine 0.40 - 1.50 mg/dL 1.23 1.30(H) 1.34  Sodium 135 - 145 mEq/L 138 138 136  Potassium 3.5 - 5.1 mEq/L 3.7 4.3 4.7  Chloride 96 - 112 mEq/L 100 101 99  CO2 19 - 32 mEq/L _1 Calcium 8.4 - 10.5 mg/dL 9.6 9.1 9.7    ProBNP    Component Value Date/Time   PROBNP 94.0 06/20/2012 1708    PFT    Component Value Date/Time   FEV1PRE 2.60 04/29/2016 1259   FEV1POST 2.43 08/21/2014 1304   FVCPRE 3.28 04/29/2016 1259   FVCPOST 3.06 08/21/2014 1304   DLCOUNC 6.46 04/29/2016 1259   PREFEV1FVCRT 79 04/29/2016 1259   PSTFEV1FVCRT 80 08/21/2014 1304    Dg Chest 2 View  Result Date: 04/05/2017 CLINICAL DATA:  Chronic dyspnea.  History of CABG.  Meal 09/24/2014. EXAM: CHEST  2 VIEW COMPARISON:  09/24/2014. FINDINGS: Cardiomegaly. Prior CABG. Dual lead pacer is  stable. Fibrotic change throughout the lung fields. No consolidation or edema. No effusion or pneumothorax. Osteopenia. IMPRESSION: Cardiomegaly. Areas consistent with interstitial pulmonary fibrosis remain stable. No acute findings. Electronically Signed   By: Staci Righter M.D.   On: 04/05/2017 11:14   Ct Angio Chest W/cm &/or Wo Cm  Result Date: 04/05/2017 CLINICAL DATA:  Lung cancer.  Pulmonary fibrosis.  Chronic dyspnea. EXAM: CT ANGIOGRAPHY CHEST WITH CONTRAST TECHNIQUE: Multidetector CT imaging of the chest was performed using the standard  protocol during bolus administration of intravenous contrast. Multiplanar CT image reconstructions and MIPs were obtained to evaluate the vascular anatomy. CONTRAST:  28m ISOVUE-370 IOPAMIDOL (ISOVUE-370) INJECTION 76% COMPARISON:  08/27/2014 FINDINGS: Cardiovascular: Heart is enlarged. Patient is status post CABG. Atherosclerotic calcification is noted in the wall of the thoracic aorta. No filling defect within the opacified pulmonary arteries to suggest the presence of an acute pulmonary embolus. Mediastinum/Nodes: 10 mm short axis right paratracheal lymph node identified. Other scattered upper normal mediastinal lymph nodes are evident. 17 mm right hilar lymph node is identified. Lungs/Pleura: Centrilobular and paraseptal emphysema is noted bilaterally. There is biapical pleural-parenchymal scarring. Left upper lobe mass seen on the prior study has become incorporated into new interstitial and airspace opacity in the left upper lobe tracking into the left hilum. This presumably represents radiation fibrosis, but no intervening chest CT is available for comparison. Subpleural reticular changes are compatible with reported history of pulmonary fibrosis. Upper Abdomen: Unremarkable. Musculoskeletal: Bone windows reveal no worrisome lytic or sclerotic osseous lesions. Review of the MIP images confirms the above findings. IMPRESSION: 1. No CT evidence for acute pulmonary embolus. 2. Left upper lobe lung mass seen previously is no longer evident with apparent post radiation fibrosis identified in the left upper lobe. 3. Emphysema with basilar predominant fibrotic lung disease consistent with the reported history of pulmonary fibrosis. 4. Borderline right mediastinal and mild right hilar lymphadenopathy. While this is likely related to the underlying chronic lung disease, follow-up CT chest in 3-6 months may be warranted to ensure stability. Electronically Signed   By: EMisty StanleyM.D.   On: 04/05/2017 19:33      Past medical hx Past Medical History:  Diagnosis Date  . Allergic rhinitis   . Allergic rhinitis, cause unspecified 02/28/2013  . Anxiety   . Back pain   . Bipolar disorder (HSpringer   . Chronic bronchitis ([place] 02/07/2012  . Complete heart block (HCC)    a. s/p [facility] dc ppm, ser # [id number] . Coronary artery disease s/p CABG 1990s    a. approx 1994 s/p CABG x 4 (LIMA->LAD, VG->RCA, VG->RI, VG->Diag);  b. 11/2008 Cath: LM 100, LAD small, RCA 50-60p, 100d, VG->RCA 100 ost, VG->RI 3102mVG->Diag 40 - upper branch of diag small, 99%, LIMA-LAD patent.;  c. 08/2011 Echo: EF 45-50%, mod dil LA,/RA/RV with mild-mod reduced RV fxn.  . Depression   . ED (erectile dysfunction)   . Fatigue   . GERD (gastroesophageal reflux disease)   . GI bleed   . Hip pain, right   . History of kidney stones   . HTN (hypertension)   . Hyperlipidemia   . Lumbar disc disease   . Malignant neoplasm of prostate ([place]  . Osteoarthritis   . Peptic stricture of esophagus   . Pneumonia 1993   post-op  . PSA (psoriatic arthritis) ([facility]   increased  . Pulmonary fibrosis, postinflammatory ([facility]   Stopped Cellcept 02/13/13 d/t cost   .  PVD (peripheral vascular disease) ([place])    a. 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  bilat ext iliac dzs.  . Shortness of breath dyspnea   . Shoulder joint pain      Social History   Tobacco Use  . Smoking status: Former Smoker    Packs/day: 1.50    Years: 42.00    Pack years: 63.00    Types: Cigarettes    Last attempt to quit: 09/11/2001    Years since quitting: 15.6  . Smokeless tobacco: Never Used  . Tobacco comment: Pt does not get regular exercise  Substance Use Topics  . Alcohol use: No  . Drug use: No    Mr.Goetze reports that he quit smoking about 15 years ago. His smoking use included cigarettes. He has a 63.00 pack-year smoking history. he has never used smokeless tobacco. He reports that he does not drink alcohol or  use drugs.  Tobacco Cessation: Former smoker quit 2003 with a 63 pack year smoking history  Past surgical hx, Family hx, Social hx all reviewed.  Current Outpatient Medications on File Prior to Visit  Medication Sig  . acetaminophen (TYLENOL) 500 MG tablet Take 1,000 mg by mouth every 6 (six) hours as needed for moderate pain or headache.  . ALPRAZolam (XANAX) 0.25 MG tablet TAKE 1 TABLET BY MOUTH EVERY DAY AS NEEDED  . amLODipine (NORVASC) 5 MG tablet Take 2.5 mg daily by mouth.  Marland Kitchen aspirin EC 81 MG tablet Take 1 tablet (81 mg total) daily by mouth.  . citalopram (CELEXA) 20 MG tablet Take 1 tablet (20 mg total) by mouth daily.  Marland Kitchen doxycycline (VIBRA-TABS) 100 MG tablet Take 1 tablet (100 mg total) 2 (two) times daily by mouth.  . fluticasone (FLONASE) 50 MCG/ACT nasal spray Place 2 sprays into the nose daily. Reported on 05/10/2015  . gabapentin (NEURONTIN) 300 MG capsule Take 300 mg daily as needed by mouth.  Marland Kitchen lisinopril (PRINIVIL,ZESTRIL) 2.5 MG tablet Take 1 tablet (2.5 mg total) daily by mouth.  . metoprolol succinate (TOPROL-XL) 25 MG 24 hr tablet Take 1/2 tablet (12.5 mg) by mouth twice daily  . Multiple Vitamins-Minerals (PRESERVISION AREDS PO) Take 1 tablet by mouth 2 (two) times daily.  . nitroGLYCERIN (NITROSTAT) 0.4 MG SL tablet Place 1 tablet (0.4 mg total) under the tongue every 5 (five) minutes as needed for chest pain.  Marland Kitchen omeprazole (PRILOSEC) 40 MG capsule TAKE ONE CAPSULE BY MOUTH EVERY DAY  . OXYGEN Inhale into the lungs. 3 lpm qhs  4 lpm pulsed with exertion as needed DME:  . predniSONE (DELTASONE) 10 MG tablet 9mX3 days, 360mX3 days, 2070m3 days, 25m78mdays, then stop.  . simvastatin (ZOCOR) 40 MG tablet TAKE 1 TABLET BY MOUTH AT BEDTIME   No current facility-administered medications on file prior to visit.      Allergies  Allergen Reactions  . Ofev [Nintedanib] Other (See Comments)    Diarrhea  Diarrhea   . Pirfenidone Nausea And Vomiting and Nausea Only     Review Of Systems:  Constitutional:   No  weight loss, night sweats,  Fevers, chills, fatigue, or  lassitude.  HEENT:   No headaches,  Difficulty swallowing,  Tooth/dental problems, or  Sore throat,                No sneezing, itching, ear ache, nasal congestion, post nasal drip,   CV:  No chest pain,  Orthopnea, PND, swelling in lower  extremities, anasarca, dizziness, palpitations, syncope.   GI  No heartburn, indigestion, abdominal pain, nausea, vomiting, diarrhea, change in bowel habits, loss of appetite, bloody stools.   Resp: N+shortness of breath with exertion and  at rest.  No excess mucus, no productive cough,  No non-productive cough,  No coughing up of blood.  No change in color of mucus.  No wheezing.  No chest wall deformity  Skin: no rash or lesions.  GU: no dysuria, change in color of urine, no urgency or frequency.  No flank pain, no hematuria   MS:  No joint pain or swelling.  No decreased range of motion.  No back pain.  Psych:  No change in mood or affect. No depression or anxiety.  No memory loss.   Vital Signs BP 126/78 (BP Location: Left Arm, Cuff Size: Normal)   Pulse 72   Ht 6' (1.829 m)   Wt 208 lb 6 oz (94.5 kg)   SpO2 91%   BMI 28.26 kg/m    Physical Exam:  General- No distress,  A&Ox3, pleasant ENT: No sinus tenderness, TM clear, pale nasal mucosa, no oral exudate,no post nasal drip, no LAN Cardiac: S1, S2, regular rate and rhythm, no murmur Chest: No wheeze/ rales/ dullness; no accessory muscle use, no nasal flaring, no sternal retractions Abd.: Soft Non-tender, obese Ext: No clubbing cyanosis, edema Neuro:  Deconditioned at baseline, MAE x 4, appropriate Skin: No rashes, warm and dry Psych: normal mood and behavior   Assessment/Plan  IPF (idiopathic pulmonary fibrosis) Resolved flare after tx with prednisone and doxy Plan: Continue wearing oxygen continuously to maintain your oxygen saturations 92-94% We will walk you today to  qualify you for oxygen per your DME request. Follow up with Dr. Chase Caller in February 2019 for IPF. Follow up CT chest without 08/2017 to recheck lymphadenopathy Please contact office for sooner follow up if symptoms do not improve or worsen or seek emergency care     Squamous cell carcinoma of lung, stage I Follow up per [facility] oncology  Sinusitis Sinusitis with fever and discolored bloody nasal drainage Plan: Augmentin 875 twice daily x 7 days for sinusitis. Probiotic while on antibiotic. Follow up as needed for sinusitis. May use Chlor-trimeton 48m, 2 tablets  at bedtime as needed for post nasal drainage Follow up with Dr. RChase Callerin February 2019 for IPF. Please contact office for sooner follow up if symptoms do not improve or worsen or seek emergency care       SMagdalen Spatz NP 04/19/2017  2:13 PM

## 2017-04-19 NOTE — Assessment & Plan Note (Signed)
Sinusitis with fever and discolored bloody nasal drainage Plan: Augmentin 875 twice daily x 7 days for sinusitis. Probiotic while on antibiotic. Follow up as needed for sinusitis. May use Chlor-trimeton 26m, 2 tablets  at bedtime as needed for post nasal drainage Follow up with Dr. RChase Callerin February 2019 for IPF. Please contact office for sooner follow up if symptoms do not improve or worsen or seek emergency care

## 2017-04-19 NOTE — Assessment & Plan Note (Addendum)
Resolved flare after tx with prednisone and doxy Plan: Continue wearing oxygen continuously to maintain your oxygen saturations 92-94% We will walk you today to qualify you for oxygen per your DME request. Follow up with Dr. Chase Caller in February 2019 for IPF. Follow up CT chest without 08/2017 to recheck lymphadenopathy Please contact office for sooner follow up if symptoms do not improve or worsen or seek emergency care

## 2017-04-19 NOTE — Assessment & Plan Note (Signed)
Follow up per [facility] oncology

## 2017-04-20 ENCOUNTER — Telehealth: Payer: Self-pay | Admitting: Physician Assistant

## 2017-04-20 NOTE — Telephone Encounter (Signed)
New Message     Patient returning call for results for myocardial perfusion

## 2017-04-20 NOTE — Telephone Encounter (Signed)
Returned pts call.  He has been made aware of his stress test results. See result note.

## 2017-04-20 NOTE — Telephone Encounter (Signed)
-----  Message from [place], [place] sent at 04/19/2017 10:00 AM EST ----- Please let patient know Dr. Alan Ripper recommendations.   ----- Message ----- From: [name], MD Sent: 04/19/2017   9:08 AM To: Leanor Kail, [place]  Myovue with no ischemia I am not convinced that LVEF is truly down  May be underestimate of myovue   Volume looked ok you said on exam He has pulmonary fibrosis  This may be stressing things at times  My inclination is to follow him clinically for worsening  I would not sched any furhter testing for now.   Place him to see me after the holidays ----- Message ----- From: Leanor Kail, PA Sent: 04/14/2017   2:46 PM To: [name], MD  Please advise for plan. Last cath in 2014. Last echo in 2016 showed stable LVEF of 35% to 40%. He was euvolemic during my evaluation.

## 2017-04-21 ENCOUNTER — Telehealth: Payer: Self-pay | Admitting: Acute Care

## 2017-04-21 MED ORDER — AMOXICILLIN-POT CLAVULANATE 875-125 MG PO TABS: 1.0000 | ORAL_TABLET | Freq: Two times a day (BID) | ORAL | 0 refills | Status: DC

## 2017-04-21 NOTE — Telephone Encounter (Signed)
Left message for pt to call us back. 

## 2017-04-21 NOTE — Telephone Encounter (Signed)
ATC pt, no answer. Left message for pt to call back.  

## 2017-04-21 NOTE — Telephone Encounter (Signed)
Pt is aware of SG's recommendations and voiced his understanding. Order for CT has been ordered.  Nothing further needed.

## 2017-04-21 NOTE — Telephone Encounter (Signed)
Pt returned call.-tr

## 2017-04-21 NOTE — Telephone Encounter (Signed)
Patient returned call, CB is [phone]

## 2017-04-21 NOTE — Telephone Encounter (Signed)
Spoke with pt who stated that he had verbalized to both the [place] and NP that pharmacy he uses now is [facility].   I told pt I would send the Rx of Augmentin to his pharmacy. Pt expressed understanding. Nothing further needed.

## 2017-06-18 ENCOUNTER — Ambulatory Visit (INDEPENDENT_AMBULATORY_CARE_PROVIDER_SITE_OTHER): Payer: Medicare Other | Admitting: Internal Medicine

## 2017-06-18 ENCOUNTER — Encounter: Payer: Self-pay | Admitting: Internal Medicine

## 2017-06-18 VITALS — BP 96/76 | HR 63 | Ht 72.0 in | Wt 210.1 lb

## 2017-06-18 DIAGNOSIS — E785 Hyperlipidemia, unspecified: Secondary | ICD-10-CM

## 2017-06-18 DIAGNOSIS — I2510 Atherosclerotic heart disease of native coronary artery without angina pectoris: Secondary | ICD-10-CM

## 2017-06-18 DIAGNOSIS — I5022 Chronic systolic (congestive) heart failure: Secondary | ICD-10-CM

## 2017-06-18 DIAGNOSIS — I10 Essential (primary) hypertension: Secondary | ICD-10-CM | POA: Diagnosis not present

## 2017-06-18 DIAGNOSIS — J84112 Idiopathic pulmonary fibrosis: Secondary | ICD-10-CM

## 2017-06-18 LAB — LIPID PANEL
CHOL/HDL RATIO: 3.2 ratio (ref 0.0–5.0)
Cholesterol, Total: 110 mg/dL (ref 100–199)
HDL: 34 mg/dL — ABNORMAL LOW (ref >39)
LDL Calculated: 48 mg/dL (ref 0–99)
Triglycerides: 142 mg/dL (ref 0–149)
VLDL CHOLESTEROL CAL: 28 mg/dL (ref 5–40)

## 2017-06-18 LAB — PRO B NATRIURETIC PEPTIDE: NT-Pro BNP: 476 pg/mL (ref 0–486)

## 2017-06-18 LAB — BASIC METABOLIC PANEL
BUN / CREAT RATIO: 15 (ref 10–24)
BUN: 20 mg/dL (ref 8–27)
CO2: 23 mmol/L (ref 20–29)
CREATININE: 1.34 mg/dL — AB (ref 0.76–1.27)
Calcium: 9.6 mg/dL (ref 8.6–10.2)
Chloride: 102 mmol/L (ref 96–106)
GFR calc Af Amer: 59 mL/min/{1.73_m2} — ABNORMAL LOW (ref >59)
GFR calc non Af Amer: 51 mL/min/{1.73_m2} — ABNORMAL LOW (ref >59)
Glucose: 106 mg/dL — ABNORMAL HIGH (ref 65–99)
Potassium: 4.4 mmol/L (ref 3.5–5.2)
SODIUM: 140 mmol/L (ref 134–144)

## 2017-06-18 NOTE — Patient Instructions (Addendum)
Medication Instructions:  Your physician recommends that you continue on your current medications as directed. Please refer to the Current Medication list given to you today.   Labwork: TODAY: LIPIDS, BMET, BNP  Testing/Procedures: Your physician has requested that you have an echocardiogram. Echocardiography is a painless test that uses sound waves to create images of your heart. It provides your doctor with information about the size and shape of your heart and how well your heart's chambers and valves are working. This procedure takes approximately one hour. There are no restrictions for this procedure.  Follow-Up: Your physician wants you to follow-up in: November with Dr. Harrington Challenger. You will receive a reminder letter in the mail two months in advance. If you don't receive a letter, please call our office to schedule the follow-up appointment.   Any Other Special Instructions Will Be Listed Below (If Applicable).     If you need a refill on your cardiac medications before your next appointment, please call your pharmacy.

## 2017-06-18 NOTE — Progress Notes (Signed)
Cardiology Office Note   Date:  06/18/2017   ID:  TREYVON BLAHUT, DOB [birth year], MRN [id number]  PCP:  [name], PA-C  Cardiologist:   Dorris Carnes, MD   F/U of CAD    History of Present Illness: Wayne Torres is a 77 y.o. male with a history ofCAD S/P CABG remotely  Cath in 2014 LIMA to LAD patent SVG to OM and Diag patent  SVG to RCA 100%  LVEF 30%  Echo in 7/15 35 to 40%  Also has a  history of lung CA and IPF  Ollowed at [facility]    I saw the pt in May 2018   He was seen by B Bhagat in November 2018  Complained of DOE as well as some chest discomfort when moving boxes Set up for [employer]   This showed inferior scar   LVEF 28%    Pt denies chest discomfort at all  Moosic he just got SOB   Treated with prednisone  Now on O2 all the time  Doing better     No edema  No CP  No PND  Wears O2 all the time     Outpatient Medications Prior to Visit  Medication Sig Dispense Refill  . acetaminophen (TYLENOL) 500 MG tablet Take 1,000 mg by mouth every 6 (six) hours as needed for moderate pain or headache.    . ALPRAZolam (XANAX) 0.25 MG tablet TAKE 1 TABLET BY MOUTH EVERY DAY AS NEEDED 30 tablet 2  . amLODipine (NORVASC) 5 MG tablet Take 2.5 mg daily by mouth.  0  . amoxicillin-clavulanate (AUGMENTIN) 875-125 MG tablet Take 1 tablet by mouth 2 (two) times daily. 14 tablet 0  . aspirin EC 81 MG tablet Take 1 tablet (81 mg total) daily by mouth.    . citalopram (CELEXA) 20 MG tablet Take 1 tablet (20 mg total) by mouth daily. 90 tablet 3  . doxycycline (VIBRA-TABS) 100 MG tablet Take 1 tablet (100 mg total) 2 (two) times daily by mouth. 14 tablet 0  . fluticasone (FLONASE) 50 MCG/ACT nasal spray Place 2 sprays into the nose daily. Reported on 05/10/2015    . gabapentin (NEURONTIN) 300 MG capsule Take 300 mg daily as needed by mouth.  1  . lisinopril (PRINIVIL,ZESTRIL) 2.5 MG tablet Take 1 tablet (2.5 mg total) daily by mouth. 90 tablet 3  . metoprolol succinate (TOPROL-XL) 25 MG  24 hr tablet Take 1/2 tablet (12.5 mg) by mouth twice daily    . Multiple Vitamins-Minerals (PRESERVISION AREDS PO) Take 1 tablet by mouth 2 (two) times daily.    . nitroGLYCERIN (NITROSTAT) 0.4 MG SL tablet Place 1 tablet (0.4 mg total) under the tongue every 5 (five) minutes as needed for chest pain. 25 tablet 3  . omeprazole (PRILOSEC) 40 MG capsule TAKE ONE CAPSULE BY MOUTH EVERY DAY 90 capsule 3  . OXYGEN Inhale into the lungs. 3 lpm qhs  4 lpm pulsed with exertion as needed DME:    . predniSONE (DELTASONE) 10 MG tablet 70mX3 days, 359mX3 days, 2071m3 days, 73m66mdays, then stop. 30 tablet 0  . simvastatin (ZOCOR) 40 MG tablet TAKE 1 TABLET BY MOUTH AT BEDTIME 90 tablet 2   No facility-administered medications prior to visit.      Allergies:   Ofev [nintedanib] and Pirfenidone   Past Medical History:  Diagnosis Date  . Allergic rhinitis   . Allergic rhinitis, cause unspecified 02/28/2013  . Anxiety   .  Back pain   . Bipolar disorder ([place])   . Chronic bronchitis ([place]) 02/07/2012  . Complete heart block (HCC)    a. s/p SJM Accent dc ppm, ser # [id number]  . Coronary artery disease s/p CABG 1990s    a. approx 1994 s/p CABG x 4 (LIMA->LAD, VG->RCA, VG->RI, VG->Diag);  b. 11/2008 Cath: LM 100, LAD small, RCA 50-60p, 100d, VG->RCA 100 ost, VG->RI 55m VG->Diag 40 - upper branch of diag small, 99%, LIMA-LAD patent.;  c. 08/2011 Echo: EF 45-50%, mod dil LA,/RA/RV with mild-mod reduced RV fxn.  . Depression   . ED (erectile dysfunction)   . Fatigue   . GERD (gastroesophageal reflux disease)   . GI bleed   . Hip pain, right   . History of kidney stones   . HTN (hypertension)   . Hyperlipidemia   . Lumbar disc disease   . Malignant neoplasm of prostate ([place]   . Osteoarthritis   . Peptic stricture of esophagus   . Pneumonia 1993   post-op  . PSA (psoriatic arthritis) ([place]    increased  . Pulmonary fibrosis, postinflammatory ([place]    Stopped Cellcept 02/13/13 d/t cost   . PVD  (peripheral vascular disease) ([place]    a. 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  bilat ext iliac dzs.  . Shortness of breath dyspnea   . Shoulder joint pain     Past Surgical History:  Procedure Laterality Date  . CORONARY ANGIOPLASTY  06/03/2007, 01/25/2009  . CORONARY ARTERY BYPASS GRAFT  20 yrs ago   LIMA to LAD, SVG to PDA, SVG to Ramus, SVG to D  . CYSTOSCOPY    . LEFT AND RIGHT HEART CATHETERIZATION WITH CORONARY ANGIOGRAM N/A 07/05/2012   Procedure: LEFT AND RIGHT HEART CATHETERIZATION WITH CORONARY ANGIOGRAM;  Surgeon: Peter M JMartinique MD;  [facility];  Service: Cardiovascular;  Laterality: N/A;  . NASAL SEPTUM SURGERY  april 2012   dr britt, [facility] [place]  . PACEMAKER PLACEMENT  08/2011  . PERMANENT PACEMAKER INSERTION N/A 09/14/2011   Procedure: PERMANENT PACEMAKER INSERTION;  Surgeon: SDeboraha Sprang MD;  [facility];  Service: Cardiovascular;  Laterality: N/A;  . VIDEO BRONCHOSCOPY WITH ENDOBRONCHIAL ULTRASOUND Left 09/05/2014   Procedure: VIDEO BRONCHOSCOPY WITH ENDOBRONCHIAL ULTRASOUND;  Surgeon: RCollene Gobble MD;  [place] [facility]  Service: Thoracic;  Laterality: Left;     Social History:  The patient  reports that he quit smoking about 15 years ago. His smoking use included cigarettes. He has a 63.00 pack-year smoking history. he has never used smokeless tobacco. He reports that he does not drink alcohol or use drugs.   Family History:  The patient's family history includes Heart attack in his father; Heart disease in his father.    ROS:  Please see the history of present illness. All other systems are reviewed and  Negative to the above problem except as noted.    PHYSICAL EXAM: VS:  BP 96/76   Pulse 63   Ht 6' (1.829 m)   Wt 210 lb 1.9 oz (95.3 kg)   BMI 28.50 kg/m   GEN: Well nourished, well developed, in no acute distress HEENT: normal Neck: no JVD, carotid bruits, or masses Cardiac: RRR; no murmurs,  rubs, or gallops,no edema  Respiratory: Relatively clear  Moving air OK GI: soft, nontender, nondistended, + BS  No hepatomegaly  MS: no deformity Moving all extremities   Skin: warm and  dry, no rash Neuro:  Strength and sensation are intact Psych: euthymic mood, full affect   EKG:  EKG is not ordered today.    Lipid Panel    Component Value Date/Time   CHOL 88 02/08/2015 1211   TRIG 137.0 02/08/2015 1211   HDL 27.90 (L) 02/08/2015 1211   CHOLHDL 3 02/08/2015 1211   VLDL 27.4 02/08/2015 1211   LDLCALC 33 02/08/2015 1211   LDLDIRECT 49.4 05/01/2014 1717      Wt Readings from Last 3 Encounters:  06/18/17 210 lb 1.9 oz (95.3 kg)  04/19/17 208 lb 6 oz (94.5 kg)  04/14/17 210 lb (95.3 kg)      ASSESSMENT AND PLAN:  1  CAD  No symptoms of angina  REcent myovue with inferior scar  No ischemia  2.  Chronic systolic CHF Volume status is good   Recent myovue LVEF calculated at 28%  I would recomman echo to confirm LVEF, guide medicial Rx   3  HL  Continue simvistatin 4  Pulm  Hx IPF  [facility]   5  s/p PPM  Follows with EP  F/U in November 2019   Signed, Dorris Carnes, MD  06/18/2017 9:08 AM    [facility] [place], [place], [place]  [address] Phone: [phone]; Fax: [phone]

## 2017-06-25 ENCOUNTER — Ambulatory Visit (HOSPITAL_COMMUNITY): Payer: Medicare Other | Attending: Cardiology

## 2017-06-25 ENCOUNTER — Other Ambulatory Visit: Payer: Self-pay

## 2017-06-25 DIAGNOSIS — I110 Hypertensive heart disease with heart failure: Secondary | ICD-10-CM | POA: Insufficient documentation

## 2017-06-25 DIAGNOSIS — E785 Hyperlipidemia, unspecified: Secondary | ICD-10-CM | POA: Diagnosis not present

## 2017-06-25 DIAGNOSIS — Z951 Presence of aortocoronary bypass graft: Secondary | ICD-10-CM | POA: Insufficient documentation

## 2017-06-25 DIAGNOSIS — I5022 Chronic systolic (congestive) heart failure: Secondary | ICD-10-CM

## 2017-06-25 DIAGNOSIS — I080 Rheumatic disorders of both mitral and aortic valves: Secondary | ICD-10-CM | POA: Insufficient documentation

## 2017-06-25 DIAGNOSIS — I2510 Atherosclerotic heart disease of native coronary artery without angina pectoris: Secondary | ICD-10-CM | POA: Insufficient documentation

## 2017-06-25 DIAGNOSIS — F319 Bipolar disorder, unspecified: Secondary | ICD-10-CM | POA: Diagnosis not present

## 2017-07-22 ENCOUNTER — Other Ambulatory Visit (HOSPITAL_BASED_OUTPATIENT_CLINIC_OR_DEPARTMENT_OTHER): Payer: Medicare Other

## 2017-08-14 ENCOUNTER — Emergency Department (HOSPITAL_COMMUNITY): Payer: Medicare Other

## 2017-08-14 ENCOUNTER — Encounter (HOSPITAL_COMMUNITY): Payer: Self-pay

## 2017-08-14 ENCOUNTER — Inpatient Hospital Stay (HOSPITAL_COMMUNITY)
Admission: EM | Admit: 2017-08-14 | Discharge: 2017-08-28 | DRG: 189 | Disposition: A | Discharge status: Home Health | Payer: Medicare Other | Attending: Internal Medicine | Admitting: Internal Medicine

## 2017-08-14 ENCOUNTER — Other Ambulatory Visit: Payer: Self-pay

## 2017-08-14 DIAGNOSIS — J431 Panlobular emphysema: Secondary | ICD-10-CM

## 2017-08-14 DIAGNOSIS — S82899A Other fracture of unspecified lower leg, initial encounter for closed fracture: Secondary | ICD-10-CM

## 2017-08-14 DIAGNOSIS — I5042 Chronic combined systolic (congestive) and diastolic (congestive) heart failure: Secondary | ICD-10-CM

## 2017-08-14 DIAGNOSIS — Z7982 Long term (current) use of aspirin: Secondary | ICD-10-CM

## 2017-08-14 DIAGNOSIS — J9620 Acute and chronic respiratory failure, unspecified whether with hypoxia or hypercapnia: Secondary | ICD-10-CM

## 2017-08-14 DIAGNOSIS — K922 Gastrointestinal hemorrhage, unspecified: Secondary | ICD-10-CM

## 2017-08-14 DIAGNOSIS — Z9981 Dependence on supplemental oxygen: Secondary | ICD-10-CM

## 2017-08-14 DIAGNOSIS — E876 Hypokalemia: Secondary | ICD-10-CM | POA: Diagnosis not present

## 2017-08-14 DIAGNOSIS — J9621 Acute and chronic respiratory failure with hypoxia: Secondary | ICD-10-CM | POA: Diagnosis not present

## 2017-08-14 DIAGNOSIS — Y92008 Other place in unspecified non-institutional (private) residence as the place of occurrence of the external cause: Secondary | ICD-10-CM

## 2017-08-14 DIAGNOSIS — I248 Other forms of acute ischemic heart disease: Secondary | ICD-10-CM

## 2017-08-14 DIAGNOSIS — Z85118 Personal history of other malignant neoplasm of bronchus and lung: Secondary | ICD-10-CM

## 2017-08-14 DIAGNOSIS — T380X5A Adverse effect of glucocorticoids and synthetic analogues, initial encounter: Secondary | ICD-10-CM | POA: Diagnosis not present

## 2017-08-14 DIAGNOSIS — E785 Hyperlipidemia, unspecified: Secondary | ICD-10-CM | POA: Diagnosis present

## 2017-08-14 DIAGNOSIS — R579 Shock, unspecified: Secondary | ICD-10-CM | POA: Diagnosis present

## 2017-08-14 DIAGNOSIS — R778 Other specified abnormalities of plasma proteins: Secondary | ICD-10-CM

## 2017-08-14 DIAGNOSIS — J9690 Respiratory failure, unspecified, unspecified whether with hypoxia or hypercapnia: Secondary | ICD-10-CM

## 2017-08-14 DIAGNOSIS — Z888 Allergy status to other drugs, medicaments and biological substances status: Secondary | ICD-10-CM

## 2017-08-14 DIAGNOSIS — Z951 Presence of aortocoronary bypass graft: Secondary | ICD-10-CM

## 2017-08-14 DIAGNOSIS — S82851A Displaced trimalleolar fracture of right lower leg, initial encounter for closed fracture: Secondary | ICD-10-CM | POA: Diagnosis present

## 2017-08-14 DIAGNOSIS — R195 Other fecal abnormalities: Secondary | ICD-10-CM | POA: Diagnosis present

## 2017-08-14 DIAGNOSIS — F319 Bipolar disorder, unspecified: Secondary | ICD-10-CM | POA: Diagnosis present

## 2017-08-14 DIAGNOSIS — J9601 Acute respiratory failure with hypoxia: Secondary | ICD-10-CM

## 2017-08-14 DIAGNOSIS — Z9181 History of falling: Secondary | ICD-10-CM

## 2017-08-14 DIAGNOSIS — E1122 Type 2 diabetes mellitus with diabetic chronic kidney disease: Secondary | ICD-10-CM | POA: Diagnosis present

## 2017-08-14 DIAGNOSIS — J84112 Idiopathic pulmonary fibrosis: Secondary | ICD-10-CM

## 2017-08-14 DIAGNOSIS — I272 Pulmonary hypertension, unspecified: Secondary | ICD-10-CM

## 2017-08-14 DIAGNOSIS — I2723 Pulmonary hypertension due to lung diseases and hypoxia: Secondary | ICD-10-CM | POA: Diagnosis present

## 2017-08-14 DIAGNOSIS — I252 Old myocardial infarction: Secondary | ICD-10-CM

## 2017-08-14 DIAGNOSIS — Z950 Presence of cardiac pacemaker: Secondary | ICD-10-CM

## 2017-08-14 DIAGNOSIS — E1165 Type 2 diabetes mellitus with hyperglycemia: Secondary | ICD-10-CM | POA: Diagnosis not present

## 2017-08-14 DIAGNOSIS — N179 Acute kidney failure, unspecified: Secondary | ICD-10-CM

## 2017-08-14 DIAGNOSIS — W1839XA Other fall on same level, initial encounter: Secondary | ICD-10-CM | POA: Diagnosis present

## 2017-08-14 DIAGNOSIS — I442 Atrioventricular block, complete: Secondary | ICD-10-CM | POA: Diagnosis present

## 2017-08-14 DIAGNOSIS — Z87442 Personal history of urinary calculi: Secondary | ICD-10-CM

## 2017-08-14 DIAGNOSIS — Z66 Do not resuscitate: Secondary | ICD-10-CM | POA: Diagnosis present

## 2017-08-14 DIAGNOSIS — Z8546 Personal history of malignant neoplasm of prostate: Secondary | ICD-10-CM

## 2017-08-14 DIAGNOSIS — G4700 Insomnia, unspecified: Secondary | ICD-10-CM | POA: Diagnosis not present

## 2017-08-14 DIAGNOSIS — I21A1 Myocardial infarction type 2: Secondary | ICD-10-CM | POA: Diagnosis present

## 2017-08-14 DIAGNOSIS — E872 Acidosis: Secondary | ICD-10-CM | POA: Diagnosis present

## 2017-08-14 DIAGNOSIS — J309 Allergic rhinitis, unspecified: Secondary | ICD-10-CM | POA: Diagnosis present

## 2017-08-14 DIAGNOSIS — Z923 Personal history of irradiation: Secondary | ICD-10-CM

## 2017-08-14 DIAGNOSIS — Z9119 Patient's noncompliance with other medical treatment and regimen: Secondary | ICD-10-CM

## 2017-08-14 DIAGNOSIS — R296 Repeated falls: Secondary | ICD-10-CM | POA: Diagnosis present

## 2017-08-14 DIAGNOSIS — Z8701 Personal history of pneumonia (recurrent): Secondary | ICD-10-CM

## 2017-08-14 DIAGNOSIS — H9190 Unspecified hearing loss, unspecified ear: Secondary | ICD-10-CM | POA: Diagnosis present

## 2017-08-14 DIAGNOSIS — M5136 Other intervertebral disc degeneration, lumbar region: Secondary | ICD-10-CM | POA: Diagnosis present

## 2017-08-14 DIAGNOSIS — J432 Centrilobular emphysema: Secondary | ICD-10-CM | POA: Diagnosis present

## 2017-08-14 DIAGNOSIS — J9811 Atelectasis: Secondary | ICD-10-CM | POA: Diagnosis present

## 2017-08-14 DIAGNOSIS — N183 Chronic kidney disease, stage 3 (moderate): Secondary | ICD-10-CM | POA: Diagnosis present

## 2017-08-14 DIAGNOSIS — R7989 Other specified abnormal findings of blood chemistry: Secondary | ICD-10-CM

## 2017-08-14 DIAGNOSIS — Z8249 Family history of ischemic heart disease and other diseases of the circulatory system: Secondary | ICD-10-CM

## 2017-08-14 DIAGNOSIS — L4050 Arthropathic psoriasis, unspecified: Secondary | ICD-10-CM | POA: Diagnosis present

## 2017-08-14 DIAGNOSIS — R7881 Bacteremia: Secondary | ICD-10-CM | POA: Diagnosis present

## 2017-08-14 DIAGNOSIS — I130 Hypertensive heart and chronic kidney disease with heart failure and stage 1 through stage 4 chronic kidney disease, or unspecified chronic kidney disease: Secondary | ICD-10-CM | POA: Diagnosis present

## 2017-08-14 DIAGNOSIS — F419 Anxiety disorder, unspecified: Secondary | ICD-10-CM | POA: Diagnosis present

## 2017-08-14 DIAGNOSIS — I255 Ischemic cardiomyopathy: Secondary | ICD-10-CM | POA: Diagnosis present

## 2017-08-14 DIAGNOSIS — E1151 Type 2 diabetes mellitus with diabetic peripheral angiopathy without gangrene: Secondary | ICD-10-CM | POA: Diagnosis present

## 2017-08-14 DIAGNOSIS — R578 Other shock: Secondary | ICD-10-CM | POA: Diagnosis present

## 2017-08-14 DIAGNOSIS — Z9862 Peripheral vascular angioplasty status: Secondary | ICD-10-CM

## 2017-08-14 DIAGNOSIS — Z79899 Other long term (current) drug therapy: Secondary | ICD-10-CM

## 2017-08-14 DIAGNOSIS — I5043 Acute on chronic combined systolic (congestive) and diastolic (congestive) heart failure: Secondary | ICD-10-CM | POA: Diagnosis present

## 2017-08-14 DIAGNOSIS — I959 Hypotension, unspecified: Secondary | ICD-10-CM

## 2017-08-14 DIAGNOSIS — K219 Gastro-esophageal reflux disease without esophagitis: Secondary | ICD-10-CM | POA: Diagnosis present

## 2017-08-14 DIAGNOSIS — Z87891 Personal history of nicotine dependence: Secondary | ICD-10-CM

## 2017-08-14 DIAGNOSIS — R011 Cardiac murmur, unspecified: Secondary | ICD-10-CM | POA: Diagnosis present

## 2017-08-14 DIAGNOSIS — Y92230 Patient room in hospital as the place of occurrence of the external cause: Secondary | ICD-10-CM | POA: Diagnosis present

## 2017-08-14 DIAGNOSIS — I2510 Atherosclerotic heart disease of native coronary artery without angina pectoris: Secondary | ICD-10-CM | POA: Diagnosis present

## 2017-08-14 LAB — CBC WITH DIFFERENTIAL/PLATELET
BASOS ABS: 0 10*3/uL (ref 0.0–0.1)
BASOS PCT: 0 %
EOS PCT: 1 %
Eosinophils Absolute: 0.2 10*3/uL (ref 0.0–0.7)
HEMATOCRIT: 41.6 % (ref 39.0–52.0)
Hemoglobin: 13.4 g/dL (ref 13.0–17.0)
LYMPHS PCT: 8 %
Lymphs Abs: 1.3 10*3/uL (ref 0.7–4.0)
MCH: 33.3 pg (ref 26.0–34.0)
MCHC: 32.2 g/dL (ref 30.0–36.0)
MCV: 103.5 fL — AB (ref 78.0–100.0)
MONOS PCT: 10 %
Monocytes Absolute: 1.5 10*3/uL — ABNORMAL HIGH (ref 0.1–1.0)
NEUTROS ABS: 12.3 10*3/uL — AB (ref 1.7–7.7)
Neutrophils Relative %: 81 %
PLATELETS: 194 10*3/uL (ref 150–400)
RBC: 4.02 MIL/uL — ABNORMAL LOW (ref 4.22–5.81)
RDW: 13.6 % (ref 11.5–15.5)
WBC: 15.3 10*3/uL — ABNORMAL HIGH (ref 4.0–10.5)

## 2017-08-14 LAB — POC OCCULT BLOOD, ED
FECAL OCCULT BLD: NEGATIVE
FECAL OCCULT BLD: POSITIVE — AB

## 2017-08-14 LAB — COMPREHENSIVE METABOLIC PANEL
ALBUMIN: 2.7 g/dL — AB (ref 3.5–5.0)
ALT: 11 U/L — AB (ref 17–63)
AST: 29 U/L (ref 15–41)
Alkaline Phosphatase: 59 U/L (ref 38–126)
Anion gap: 10 (ref 5–15)
BILIRUBIN TOTAL: 0.8 mg/dL (ref 0.3–1.2)
BUN: 31 mg/dL — ABNORMAL HIGH (ref 6–20)
CO2: 16 mmol/L — ABNORMAL LOW (ref 22–32)
CREATININE: 2.08 mg/dL — AB (ref 0.61–1.24)
Calcium: 7.3 mg/dL — ABNORMAL LOW (ref 8.9–10.3)
Chloride: 109 mmol/L (ref 101–111)
GFR calc Af Amer: 34 mL/min — ABNORMAL LOW (ref >60)
GFR, EST NON AFRICAN AMERICAN: 29 mL/min — AB (ref >60)
GLUCOSE: 119 mg/dL — AB (ref 65–99)
Potassium: 3.8 mmol/L (ref 3.5–5.1)
Sodium: 135 mmol/L (ref 135–145)
TOTAL PROTEIN: 5.5 g/dL — AB (ref 6.5–8.1)

## 2017-08-14 LAB — I-STAT ARTERIAL BLOOD GAS, ED
Acid-base deficit: 11 mmol/L — ABNORMAL HIGH (ref 0.0–2.0)
BICARBONATE: 17.1 mmol/L — AB (ref 20.0–28.0)
O2 Saturation: 86 %
PCO2 ART: 43.8 mmHg (ref 32.0–48.0)
PH ART: 7.198 — AB (ref 7.350–7.450)
PO2 ART: 62 mmHg — AB (ref 83.0–108.0)
Patient temperature: 98.2
TCO2: 18 mmol/L — ABNORMAL LOW (ref 22–32)

## 2017-08-14 LAB — I-STAT CG4 LACTIC ACID, ED: LACTIC ACID, VENOUS: 3.79 mmol/L — AB (ref 0.5–1.9)

## 2017-08-14 LAB — PROTIME-INR
INR: 1.27
Prothrombin Time: 15.7 seconds — ABNORMAL HIGH (ref 11.4–15.2)

## 2017-08-14 LAB — TROPONIN I: TROPONIN I: 0.41 ng/mL — AB (ref <0.03)

## 2017-08-14 MED ORDER — SODIUM CHLORIDE 0.9 % IV BOLUS (SEPSIS)
1000.0000 mL | Freq: Once | INTRAVENOUS | Status: AC
  Administered 2017-08-14: 1000 mL via INTRAVENOUS

## 2017-08-14 MED ORDER — SODIUM CHLORIDE 0.9 % IV SOLN
80.0000 mg | Freq: Once | INTRAVENOUS | Status: AC
  Administered 2017-08-14: 80 mg via INTRAVENOUS
  Filled 2017-08-14: qty 80

## 2017-08-14 MED ORDER — ONDANSETRON HCL 4 MG/2ML IJ SOLN
4.0000 mg | Freq: Once | INTRAMUSCULAR | Status: AC
  Administered 2017-08-14: 4 mg via INTRAVENOUS
  Filled 2017-08-14: qty 2

## 2017-08-14 MED ORDER — SODIUM CHLORIDE 0.9 % IV SOLN
10.0000 mL/h | Freq: Once | INTRAVENOUS | Status: DC
Start: 2017-08-14 — End: 2017-08-18

## 2017-08-14 MED ORDER — SODIUM CHLORIDE 0.9 % IV SOLN
8.0000 mg/h | INTRAVENOUS | Status: DC
Start: 2017-08-14 — End: 2017-08-17
  Administered 2017-08-14 – 2017-08-17 (×7): 8 mg/h via INTRAVENOUS
  Filled 2017-08-14 (×13): qty 80

## 2017-08-14 NOTE — ED Triage Notes (Addendum)
Pt BIB [facility] for eval of hypotension. Pt dxd w/ new R ankle fx today, ambulated around  House and felt lightheaded. Noted to be 60 palp on EMS arrival, persistently hypotensive on ED arrival to 75/51. EMS reports near syncopal episode at home prompting EMS

## 2017-08-14 NOTE — ED Provider Notes (Signed)
[facility] Provider Note   CSN: [id number] Arrival date & time: 08/14/17  2148     History   Chief Complaint Chief Complaint  Patient presents with  . Hypotension    HPI Wayne Torres is a 77 y.o. male.  Pt presents to the ED today with low blood pressure.  Pt was in the ED on 3/21 for a fall resulting in a bimalleolar right ankle fracture.  He saw Dr. Michaelle Birks (ortho) yesterday and a cadillac splint was applied.  The pt has been able to ambulate with it.  Today, he fell and bp was noted to be 60 on EMS arrival.  He was given 1L NS and bp is still low.  Pt denies any pain.  He has pulmonary fibrosis and said his breathing is at his norm.  He does feel nauseous.     Past Medical History:  Diagnosis Date  . Allergic rhinitis   . Anxiety   . Back pain   . Bipolar disorder ([place])   . Chronic bronchitis ([place]) 02/07/2012  . Complete heart block (HCC)    a. s/p SJM Accent dc ppm, ser # [id number]  . Coronary artery disease s/p CABG 1990s    a. approx 1994 s/p CABG x 4 (LIMA->LAD, VG->RCA, VG->RI, VG->Diag);  b. 11/2008 Cath: LM 100, LAD small, RCA 50-60p, 100d, VG->RCA 100 ost, VG->RI 71m VG->Diag 40 - upper branch of diag small, 99%, LIMA-LAD patent.;  c. 08/2011 Echo: EF 45-50%, mod dil LA,/RA/RV with mild-mod reduced RV fxn.  . Depression   . ED (erectile dysfunction)   . Fatigue   . GERD (gastroesophageal reflux disease)   . GI bleed   . Hip pain, right   . History of kidney stones   . HTN (hypertension)   . Hyperlipidemia   . Lumbar disc disease   . Malignant neoplasm of prostate ([place]   . Osteoarthritis   . Peptic stricture of esophagus   . Pneumonia 1993   Post-op  . Pulmonary fibrosis, postinflammatory ([place]    Stopped Cellcept 02/13/13 d/t cost   . PVD (peripheral vascular disease) ([facility]    a. 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  bilat ext iliac dzs.  . Shoulder joint pain     Patient Active Problem  List   Diagnosis Date Noted  . Shock ([place] 08/15/2017  . Type 2 myocardial infarction ([place]   . Dyspnea 04/05/2017  . Chronic respiratory failure ([place] 06/27/2015  . Other fatigue 05/10/2015  . Squamous cell carcinoma of lung, stage I ([place] 08/28/2014  . Benign fibroma of prostate 03/10/2013  . Bladder calculi 03/10/2013  . Kidney cysts 03/10/2013  . Gross hematuria 02/28/2013  . Allergic rhinitis 02/28/2013  . Shoulder joint pain   . Chronic systolic CHF (congestive heart failure), NYHA class 3 ([place] 05/30/2012  . Coronary artery disease s/p CABG 1990s   . IPF (idiopathic pulmonary fibrosis) ([place] 03/31/2012  . Pacemaker-St.Jude 12/28/2011  . CHB (complete heart block) ([place] 09/12/2011  . BP (high blood pressure) 09/11/2011  . Peripheral blood vessel disorder ([place] 09/11/2011  . H/O coronary artery bypass surgery 09/11/2011  . Hyperlipidemia 03/20/2011  . Sinusitis 11/18/2010  . Left wrist pain 11/18/2010  . ATHEROSLERO NATV ART EXTREM W/INTERMIT CLAUDICAT 03/04/2009  . CORONARY ARTERY BYPASS GRAFT, HX OF 02/07/2009  . PERCUTANEOUS TRANSLUMINAL CORONARY ANGIOPLASTY, HX OF 02/07/2009  . CORONARY ARTERY DISEASE 01/30/2009  . Pain in joint, shoulder region  08/01/2008  . BACK PAIN 08/01/2008  . HIP PAIN, RIGHT 01/31/2008  . PSA, INCREASED 05/05/2007  . [place] DISEASE, LUMBAR 05/04/2007  . FATIGUE 05/04/2007  . ANXIETY 02/28/2007  . ERECTILE DYSFUNCTION 02/28/2007  . DEPRESSION 02/28/2007  . Essential hypertension 02/28/2007  . OSTEOARTHRITIS 02/28/2007  . RENAL CALCULUS, HX OF 02/28/2007    Past Surgical History:  Procedure Laterality Date  . CORONARY ANGIOPLASTY  06/03/2007, 01/25/2009  . CORONARY ARTERY BYPASS GRAFT  20 yrs ago   LIMA to LAD, SVG to PDA, SVG to Ramus, SVG to D  . CYSTOSCOPY    . LEFT AND RIGHT HEART CATHETERIZATION WITH CORONARY ANGIOGRAM N/A 07/05/2012   Procedure: LEFT AND RIGHT HEART CATHETERIZATION WITH CORONARY ANGIOGRAM;  Surgeon: Peter M Martinique, MD;   Location[facility] CATH LAB;  Service: Cardiovascular;  Laterality: N/A;  . NASAL SEPTUM SURGERY  april 2012   dr britt, [place] [place] - [place]  . PACEMAKER PLACEMENT  08/2011  . PERMANENT PACEMAKER INSERTION N/A 09/14/2011   Procedure: PERMANENT PACEMAKER INSERTION;  Surgeon: Deboraha Sprang, MD;  Location: [facility] CATH LAB;  Service: Cardiovascular;  Laterality: N/A;  . VIDEO BRONCHOSCOPY WITH ENDOBRONCHIAL ULTRASOUND Left 09/05/2014   Procedure: VIDEO BRONCHOSCOPY WITH ENDOBRONCHIAL ULTRASOUND;  Surgeon: Collene Gobble, MD;  Location: [facility];  Service: Thoracic;  Laterality: Left;        Home Medications    Prior to Admission medications   Medication Sig Start Date End Date Taking? Authorizing Provider  acetaminophen (TYLENOL) 500 MG tablet Take 1,000 mg by mouth every 6 (six) hours as needed for moderate pain or headache.   Yes [provider]  ALPRAZolam Duanne Moron) 0.25 MG tablet TAKE 1 TABLET BY MOUTH EVERY DAY AS NEEDED 05/03/15  Yes Biagio Borg, MD  amLODipine (NORVASC) 5 MG tablet Take 2.5 mg daily by mouth. 01/14/17  Yes [provider]  aspirin EC 81 MG tablet Take 1 tablet (81 mg total) daily by mouth. 04/07/17  Yes Bhagat, Bhavinkumar, PA  citalopram (CELEXA) 20 MG tablet Take 1 tablet (20 mg total) by mouth daily. 07/01/15  Yes Biagio Borg, MD  fluticasone [place]) 50 MCG/ACT nasal spray Place 2 sprays into the nose daily. Reported on 05/10/2015   Yes [provider]  gabapentin (NEURONTIN) 300 MG capsule Take 300 mg by mouth 3 (three) times daily.  02/22/17  Yes [provider]  HYDROcodone-acetaminophen (NORCO/VICODIN) 5-325 MG tablet Take 1 tablet by mouth every 4 (four) hours. 08/12/17  Yes [provider]  lisinopril (PRINIVIL,ZESTRIL) 2.5 MG tablet Take 1 tablet (2.5 mg total) daily by mouth. 04/07/17 08/15/25 Yes Bhagat, Bhavinkumar, PA  metoprolol succinate (TOPROL-XL) 25 MG 24 hr tablet Take 1/2 tablet (12.5 mg) by mouth twice daily   Yes [provider]  Multiple Vitamins-Minerals (PRESERVISION AREDS PO) Take 1 tablet by mouth 2 (two) times daily.   Yes [provider]  nitroGLYCERIN (NITROSTAT) 0.4 MG SL tablet Place 1 tablet (0.4 mg total) under the tongue every 5 (five) minutes as needed for chest pain. 09/25/16  Yes [name], MD  OFEV 100 MG CAPS Take 100 mg by mouth 2 (two) times daily. 08/12/17  Yes [provider]  omeprazole (PRILOSEC) 40 MG capsule TAKE ONE CAPSULE BY MOUTH EVERY DAY 06/25/14  Yes Biagio Borg, MD  simvastatin (ZOCOR) 40 MG tablet TAKE 1 TABLET BY MOUTH AT BEDTIME 09/30/15  Yes Biagio Borg, MD  amoxicillin-clavulanate (AUGMENTIN) 875-125 MG tablet Take 1 tablet  by mouth 2 (two) times daily. Patient not taking: Reported on 08/15/2017 04/21/17   Magdalen Spatz, NP  doxycycline (VIBRA-TABS) 100 MG tablet Take 1 tablet (100 mg total) 2 (two) times daily by mouth. Patient not taking: Reported on 08/15/2017 04/06/17   Magdalen Spatz, NP  OXYGEN Inhale into the lungs. 3 lpm qhs  4 lpm pulsed with exertion as needed DME:    [provider]  predniSONE (DELTASONE) 10 MG tablet 45mX3 days, 337mX3 days, 2035m3 days, 28m58mdays, then stop. Patient not taking: Reported on 08/15/2017 04/06/17   GrocMagdalen Spatz    Family History Family History  Problem Relation Age of Onset  . Heart attack Father   . Heart disease Father   . Colon cancer Neg Hx   . Cancer Neg Hx        Colon    Social History Social History   Tobacco Use  . Smoking status: Former Smoker    Packs/day: 1.50    Years: 42.00    Pack years: 63.00    Types: Cigarettes    Last attempt to quit: 09/11/2001    Years since quitting: 15.9  . Smokeless tobacco: Never Used  . Tobacco comment: Pt does not get regular exercise  Substance Use Topics  . Alcohol use: No  . Drug use: No     Allergies   Ofev [nintedanib] and Pirfenidone   Review of Systems Review of Systems  Gastrointestinal: Positive for  nausea.  Neurological: Positive for weakness.  All other systems reviewed and are negative.    Physical Exam Updated Vital Signs BP 91/63   Pulse 68   Temp 98.1 F (36.7 C) (Axillary)   Resp 14   Ht 6' (1.829 m)   Wt 102.1 kg (225 lb 1.4 oz)   SpO2 95%   BMI 30.53 kg/m   Physical Exam  Constitutional: He is oriented to person, place, and time. He appears well-developed and well-nourished. He appears distressed.  HENT:  Head: Normocephalic and atraumatic.  Right Ear: External ear normal.  Left Ear: External ear normal.  Nose: Nose normal.  Mouth/Throat: Oropharynx is clear and moist.  Eyes: Pupils are equal, round, and reactive to light. Conjunctivae and EOM are normal.  Neck: Normal range of motion. Neck supple.  Cardiovascular: Normal rate, regular rhythm, normal heart sounds and intact distal pulses.  Pulmonary/Chest: Breath sounds normal. Tachypnea noted.  Abdominal: Soft. Bowel sounds are normal.  Genitourinary: Rectal exam shows guaiac positive stool.  Musculoskeletal:  Right leg in cast  Neurological: He is alert and oriented to person, place, and time.  Skin: Skin is warm. Capillary refill takes less than 2 seconds.  Psychiatric: He has a normal mood and affect. His behavior is normal. Judgment and thought content normal.  Nursing note and vitals reviewed.    ED Treatments / Results  Labs (all labs ordered are listed, but only abnormal results are displayed) Labs Reviewed  COMPREHENSIVE METABOLIC PANEL - Abnormal; Notable for the following components:      Result Value   CO2 16 (*)    Glucose, Bld 119 (*)    BUN 31 (*)    Creatinine, Ser 2.08 (*)    Calcium 7.3 (*)    Total Protein 5.5 (*)    Albumin 2.7 (*)    ALT 11 (*)    GFR calc non Af Amer 29 (*)    GFR calc Af Amer 34 (*)    All other  components within normal limits  TROPONIN I - Abnormal; Notable for the following components:   Troponin I 0.41 (*)    All other components within normal limits   CBC WITH DIFFERENTIAL/PLATELET - Abnormal; Notable for the following components:   WBC 15.3 (*)    RBC 4.02 (*)    MCV 103.5 (*)    Neutro Abs 12.3 (*)    Monocytes Absolute 1.5 (*)    All other components within normal limits  URINALYSIS, ROUTINE W REFLEX MICROSCOPIC - Abnormal; Notable for the following components:   Color, Urine AMBER (*)    APPearance HAZY (*)    Hgb urine dipstick SMALL (*)    Protein, ur 30 (*)    Bacteria, UA RARE (*)    All other components within normal limits  PROTIME-INR - Abnormal; Notable for the following components:   Prothrombin Time 15.7 (*)    All other components within normal limits  LACTIC ACID, PLASMA - Abnormal; Notable for the following components:   Lactic Acid, Venous 3.0 (*)    All other components within normal limits  TROPONIN I - Abnormal; Notable for the following components:   Troponin I 1.16 (*)    All other components within normal limits  TROPONIN I - Abnormal; Notable for the following components:   Troponin I 3.61 (*)    All other components within normal limits  TROPONIN I - Abnormal; Notable for the following components:   Troponin I 4.57 (*)    All other components within normal limits  BASIC METABOLIC PANEL - Abnormal; Notable for the following components:   CO2 19 (*)    BUN 31 (*)    Creatinine, Ser 1.62 (*)    Calcium 7.4 (*)    GFR calc non Af Amer 40 (*)    GFR calc Af Amer 46 (*)    All other components within normal limits  MAGNESIUM - Abnormal; Notable for the following components:   Magnesium 1.6 (*)    All other components within normal limits  BLOOD GAS, ARTERIAL - Abnormal; Notable for the following components:   pH, Arterial 7.335 (*)    pO2, Arterial 46.6 (*)    Acid-base deficit 3.3 (*)    All other components within normal limits  I-STAT CG4 LACTIC ACID, ED - Abnormal; Notable for the following components:   Lactic Acid, Venous 3.79 (*)    All other components within normal limits  I-STAT  ARTERIAL BLOOD GAS, ED - Abnormal; Notable for the following components:   pH, Arterial 7.198 (*)    pO2, Arterial 62.0 (*)    Bicarbonate 17.1 (*)    TCO2 18 (*)    Acid-base deficit 11.0 (*)    All other components within normal limits  POC OCCULT BLOOD, ED - Abnormal; Notable for the following components:   Fecal Occult Bld POSITIVE (*)    All other components within normal limits  MRSA PCR SCREENING  CULTURE, BLOOD (ROUTINE X 2)  CULTURE, BLOOD (ROUTINE X 2)  LACTIC ACID, PLASMA  PROCALCITONIN  CORTISOL  CBC  PHOSPHORUS  POC OCCULT BLOOD, ED  I-STAT CG4 LACTIC ACID, ED  PREPARE RBC (CROSSMATCH)  TYPE AND SCREEN  ABO/RH    EKG None   EKG not going through on MUSE  HR 92.  AV paced  Radiology Dg Ankle Complete Right  Result Date: 08/15/2017 CLINICAL DATA:  Ankle fracture. EXAM: RIGHT ANKLE - COMPLETE 3+ VIEW COMPARISON:  None. FINDINGS: Three views are submitted within  a plaster cast. Oblique fracture of the distal fibula is displaced 4 mm. A posterior tibial fracture is minimally displaced. The fracture at the medial malleolus is likely present. There is some widening of the medial tibiotalar distance. IMPRESSION: Impressed 1. Oblique fracture of the fibula suggesting an eversion injury. 2. Minimally displaced posterior tibial fracture. 3. Probable fracture at the medial malleolus. 4. Widening of the medial tibiotalar space. Electronically Signed   By: San Morelle M.D.   On: 08/15/2017 14:03   Ct Chest Wo Contrast  Result Date: 08/15/2017 CLINICAL DATA:  Shortness of breath. Status post SP RT for non-small cell lung cancer. Idiopathic pulmonary fibrosis. EXAM: CT CHEST WITHOUT CONTRAST TECHNIQUE: Multidetector CT imaging of the chest was performed following the standard protocol without IV contrast. COMPARISON:  One-view chest x-ray 08/14/2017. CT of the chest 04/05/2017. FINDINGS: Cardiovascular: Pacing wires are in place. The patient is status post median  sternotomy for CABG. Atherosclerotic calcifications are present in the aorta without aneurysm. Pulmonary arteries are within normal limits. Mediastinum/Nodes: No significant mediastinal, axillary, or hilar adenopathy is present. The thoracic inlet is within normal limits. The esophagus is unremarkable. Lungs/Pleura: Post treatment changes of the left upper lobe are again seen. There is increase consolidation since the prior exam. Centrilobular emphysematous changes are present. Extensive peripheral fibrosis is noted. The 5 mm right middle lobe nodule is stable. Increased airspace opacities are present at the medial lower lobes bilaterally. This likely reflects atelectasis or infection. Upper Abdomen: Atherosclerotic calcifications are present in the aorta and branch vessels without aneurysm. Limited imaging of the solid organs is within normal limits. Musculoskeletal: Exaggerated thoracic kyphosis is stable. No focal lytic or blastic lesions are present. IMPRESSION: 1. Post treatment changes of the left upper lobe with increased consolidation. 2. Stable 5 mm nodule in the right middle lobe. 3. Increased airspace opacities in the medial lower lobes bilaterally. This is likely related to atelectasis or infection. Metastatic disease is considered unlikely. Recommend short-term follow-up CT chest without contrast at 2-3 months for further evaluation. 4.  Aortic Atherosclerosis (ICD10-I70.0). 5.  Emphysema (ICD10-J43.9). Electronically Signed   By: San Morelle M.D.   On: 08/15/2017 14:01   US Renal  Result Date: 08/15/2017 CLINICAL DATA:  Acute kidney injury EXAM: RENAL / URINARY TRACT ULTRASOUND COMPLETE COMPARISON:  None. FINDINGS: Right Kidney: Length: 11.8 cm. Echogenicity within normal limits. There is mild cortical thinning. No mass or hydronephrosis visualized. Lower pole cyst measures 1.8 x 1.1 x 1.3 cm. Left Kidney: Length: 12.6 cm. Echogenicity within normal limits. No mass or hydronephrosis  visualized. Cyst at the lower pole measures 4.2 x 3.0 x 3.9 cm. Bladder: Appears normal for degree of bladder distention. Other: Small volume right upper quadrant ascites. IMPRESSION: 1. No hydronephrosis or other acute abnormality. 2. Small volume right upper quadrant ascites. Electronically Signed   By: Ulyses Jarred M.D.   On: 08/15/2017 05:59   Dg Chest Portable 1 View  Result Date: 08/14/2017 CLINICAL DATA:  Shortness of breath EXAM: PORTABLE CHEST 1 VIEW COMPARISON:  04/05/2017. FINDINGS: Low lung volumes. The cardio pericardial silhouette is enlarged. There is pulmonary vascular congestion without overt pulmonary edema. Underlying chronic interstitial changes again noted with bibasilar atelectasis/infiltrate. Left-sided permanent pacemaker again noted. Telemetry leads and defibrillator pads overlie the chest. Bones are demineralized. IMPRESSION: Cardiomegaly with low lung volumes and bibasilar atelectasis/infiltrate. Vascular congestion.  Component of interstitial edema not excluded. Underlying chronic interstitial lung disease. Electronically Signed   By: Verda Cumins.D.  On: 08/14/2017 23:29    Procedures Procedures (including critical care time)  Medications Ordered in ED Medications  0.9 %  sodium chloride infusion (10 mL/hr Intravenous Not Given 08/14/17 2356)  pantoprazole (PROTONIX) 80 mg in sodium chloride 0.9 % 250 mL (0.32 mg/mL) infusion (8 mg/hr Intravenous New Bag/Given 08/15/17 1014)  0.9 %  sodium chloride infusion (has no administration in time range)  acetaminophen (TYLENOL) tablet 650 mg (has no administration in time range)  ondansetron (ZOFRAN) injection 4 mg (has no administration in time range)  piperacillin-tazobactam (ZOSYN) IVPB 3.375 g (0 g Intravenous Stopped 08/15/17 1300)  acetaminophen (OFIRMEV) IV 1,000 mg (0 mg Intravenous Stopped 08/15/17 0530)  morphine 4 MG/ML injection 2 mg (2 mg Intravenous Given 08/15/17 1433)  MEDLINE mouth rinse (15 mLs Mouth Rinse  Given 08/15/17 1015)  sodium chloride 0.9 % bolus 1,000 mL (0 mLs Intravenous Stopped 08/15/17 0010)  ondansetron (ZOFRAN) injection 4 mg (4 mg Intravenous Given 08/14/17 2340)  pantoprazole (PROTONIX) 80 mg in sodium chloride 0.9 % 100 mL IVPB (0 mg Intravenous Stopped 08/15/17 0001)  sodium bicarbonate injection 100 mEq (100 mEq Intravenous Given 08/15/17 0213)  vancomycin (VANCOCIN) 2,000 mg in sodium chloride 0.9 % 500 mL IVPB (0 mg Intravenous Stopped 08/15/17 0454)  piperacillin-tazobactam (ZOSYN) IVPB 3.375 g (0 g Intravenous Stopped 08/15/17 0245)  magnesium sulfate IVPB 2 g 50 mL (0 g Intravenous Stopped 08/15/17 1135)     Initial Impression / Assessment and Plan / ED Course  I have reviewed the triage vital signs and the nursing notes.  Pertinent labs & imaging results that were available during my care of the patient were reviewed by me and considered in my medical decision making (see chart for details).    CRITICAL CARE Performed by: Isla Pence   Total critical care time: 60 minutes  Critical care time was exclusive of separately billable procedures and treating other patients.  Critical care was necessary to treat or prevent imminent or life-threatening deterioration.  Critical care was time spent personally by me on the following activities: development of treatment plan with patient and/or surrogate as well as nursing, discussions with consultants, evaluation of patient's response to treatment, examination of patient, obtaining history from patient or surrogate, ordering and performing treatments and interventions, ordering and review of laboratory studies, ordering and review of radiographic studies, pulse oximetry and re-evaluation of patient's condition.  Pt given blood and IVFs for GI bleed.  The pt's bp has improved, but is still low.  Pt placed on bipap and he is oxygenating better.  CXR shows edema.  PT is at a risk for PE b/c of recent fracture, but with aki, he is  unable to get a CT angio.  He is unable to be anticoagulated b/c of GI bleed. Pt does see Dr. Henrene Pastor with [facility].  I am still waiting for a call back from them.  Pt d/w critical care who will come and see pt and admit.  Final Clinical Impressions(s) / ED Diagnoses   Final diagnoses:  Acute GI bleeding  AKI (acute kidney injury) ([place])  Hypotension, unspecified hypotension type  Acute on chronic respiratory failure with hypoxia (HCC)  Elevated troponin    ED Discharge Orders    None       Isla Pence, MD 08/15/17 1501

## 2017-08-14 NOTE — Progress Notes (Signed)
Pt placed on BiPAP due to hypoxic respiratory failure. Pt SATs keep declining on a Venti mask. Pt is tolerating NIV well.

## 2017-08-14 NOTE — ED Notes (Signed)
poc occult blood was POSITIVE.

## 2017-08-14 NOTE — ED Notes (Addendum)
@  2302 results from poc occult  Blood test entered incorrect.  Test was POSITIVE.  I will have [place] back to pt's acct.

## 2017-08-15 ENCOUNTER — Inpatient Hospital Stay (HOSPITAL_COMMUNITY): Payer: Medicare Other

## 2017-08-15 ENCOUNTER — Encounter (HOSPITAL_COMMUNITY): Payer: Self-pay | Admitting: Cardiology

## 2017-08-15 DIAGNOSIS — I25119 Atherosclerotic heart disease of native coronary artery with unspecified angina pectoris: Secondary | ICD-10-CM

## 2017-08-15 DIAGNOSIS — J84112 Idiopathic pulmonary fibrosis: Secondary | ICD-10-CM | POA: Diagnosis present

## 2017-08-15 DIAGNOSIS — I5042 Chronic combined systolic (congestive) and diastolic (congestive) heart failure: Secondary | ICD-10-CM | POA: Diagnosis not present

## 2017-08-15 DIAGNOSIS — I959 Hypotension, unspecified: Secondary | ICD-10-CM

## 2017-08-15 DIAGNOSIS — Z950 Presence of cardiac pacemaker: Secondary | ICD-10-CM

## 2017-08-15 DIAGNOSIS — W1839XA Other fall on same level, initial encounter: Secondary | ICD-10-CM | POA: Diagnosis present

## 2017-08-15 DIAGNOSIS — I248 Other forms of acute ischemic heart disease: Secondary | ICD-10-CM | POA: Diagnosis not present

## 2017-08-15 DIAGNOSIS — R579 Shock, unspecified: Secondary | ICD-10-CM | POA: Diagnosis not present

## 2017-08-15 DIAGNOSIS — J9621 Acute and chronic respiratory failure with hypoxia: Principal | ICD-10-CM

## 2017-08-15 DIAGNOSIS — Z951 Presence of aortocoronary bypass graft: Secondary | ICD-10-CM | POA: Diagnosis not present

## 2017-08-15 DIAGNOSIS — N183 Chronic kidney disease, stage 3 (moderate): Secondary | ICD-10-CM | POA: Diagnosis present

## 2017-08-15 DIAGNOSIS — I2723 Pulmonary hypertension due to lung diseases and hypoxia: Secondary | ICD-10-CM | POA: Diagnosis present

## 2017-08-15 DIAGNOSIS — J9811 Atelectasis: Secondary | ICD-10-CM | POA: Diagnosis present

## 2017-08-15 DIAGNOSIS — K922 Gastrointestinal hemorrhage, unspecified: Secondary | ICD-10-CM | POA: Diagnosis present

## 2017-08-15 DIAGNOSIS — Z9981 Dependence on supplemental oxygen: Secondary | ICD-10-CM | POA: Diagnosis not present

## 2017-08-15 DIAGNOSIS — I5043 Acute on chronic combined systolic (congestive) and diastolic (congestive) heart failure: Secondary | ICD-10-CM | POA: Diagnosis not present

## 2017-08-15 DIAGNOSIS — I130 Hypertensive heart and chronic kidney disease with heart failure and stage 1 through stage 4 chronic kidney disease, or unspecified chronic kidney disease: Secondary | ICD-10-CM | POA: Diagnosis present

## 2017-08-15 DIAGNOSIS — E1122 Type 2 diabetes mellitus with diabetic chronic kidney disease: Secondary | ICD-10-CM | POA: Diagnosis present

## 2017-08-15 DIAGNOSIS — N179 Acute kidney failure, unspecified: Secondary | ICD-10-CM | POA: Diagnosis present

## 2017-08-15 DIAGNOSIS — S82891D Other fracture of right lower leg, subsequent encounter for closed fracture with routine healing: Secondary | ICD-10-CM | POA: Diagnosis not present

## 2017-08-15 DIAGNOSIS — J431 Panlobular emphysema: Secondary | ICD-10-CM | POA: Diagnosis not present

## 2017-08-15 DIAGNOSIS — Y92230 Patient room in hospital as the place of occurrence of the external cause: Secondary | ICD-10-CM | POA: Diagnosis present

## 2017-08-15 DIAGNOSIS — L4050 Arthropathic psoriasis, unspecified: Secondary | ICD-10-CM | POA: Diagnosis present

## 2017-08-15 DIAGNOSIS — Y92008 Other place in unspecified non-institutional (private) residence as the place of occurrence of the external cause: Secondary | ICD-10-CM | POA: Diagnosis not present

## 2017-08-15 DIAGNOSIS — J9601 Acute respiratory failure with hypoxia: Secondary | ICD-10-CM | POA: Diagnosis not present

## 2017-08-15 DIAGNOSIS — E872 Acidosis: Secondary | ICD-10-CM

## 2017-08-15 DIAGNOSIS — Z66 Do not resuscitate: Secondary | ICD-10-CM | POA: Diagnosis present

## 2017-08-15 DIAGNOSIS — R609 Edema, unspecified: Secondary | ICD-10-CM | POA: Diagnosis not present

## 2017-08-15 DIAGNOSIS — I21A1 Myocardial infarction type 2: Secondary | ICD-10-CM

## 2017-08-15 DIAGNOSIS — E1151 Type 2 diabetes mellitus with diabetic peripheral angiopathy without gangrene: Secondary | ICD-10-CM | POA: Diagnosis present

## 2017-08-15 DIAGNOSIS — R7881 Bacteremia: Secondary | ICD-10-CM | POA: Diagnosis present

## 2017-08-15 DIAGNOSIS — I272 Pulmonary hypertension, unspecified: Secondary | ICD-10-CM | POA: Diagnosis not present

## 2017-08-15 DIAGNOSIS — R739 Hyperglycemia, unspecified: Secondary | ICD-10-CM | POA: Diagnosis not present

## 2017-08-15 DIAGNOSIS — E876 Hypokalemia: Secondary | ICD-10-CM | POA: Diagnosis not present

## 2017-08-15 DIAGNOSIS — S82891A Other fracture of right lower leg, initial encounter for closed fracture: Secondary | ICD-10-CM | POA: Diagnosis not present

## 2017-08-15 DIAGNOSIS — I214 Non-ST elevation (NSTEMI) myocardial infarction: Secondary | ICD-10-CM | POA: Diagnosis not present

## 2017-08-15 DIAGNOSIS — E1165 Type 2 diabetes mellitus with hyperglycemia: Secondary | ICD-10-CM | POA: Diagnosis not present

## 2017-08-15 DIAGNOSIS — I255 Ischemic cardiomyopathy: Secondary | ICD-10-CM | POA: Diagnosis present

## 2017-08-15 DIAGNOSIS — M79609 Pain in unspecified limb: Secondary | ICD-10-CM | POA: Diagnosis not present

## 2017-08-15 DIAGNOSIS — R748 Abnormal levels of other serum enzymes: Secondary | ICD-10-CM | POA: Diagnosis not present

## 2017-08-15 DIAGNOSIS — M7989 Other specified soft tissue disorders: Secondary | ICD-10-CM | POA: Diagnosis not present

## 2017-08-15 DIAGNOSIS — I442 Atrioventricular block, complete: Secondary | ICD-10-CM | POA: Diagnosis not present

## 2017-08-15 DIAGNOSIS — J432 Centrilobular emphysema: Secondary | ICD-10-CM | POA: Diagnosis present

## 2017-08-15 DIAGNOSIS — R578 Other shock: Secondary | ICD-10-CM | POA: Diagnosis present

## 2017-08-15 LAB — BPAM RBC
BLOOD PRODUCT EXPIRATION DATE: 201904152359
Blood Product Expiration Date: 201904152359
ISSUE DATE / TIME: 201903232236
ISSUE DATE / TIME: 201903232236
Unit Type and Rh: 9500
Unit Type and Rh: 9500

## 2017-08-15 LAB — BASIC METABOLIC PANEL
Anion gap: 9 (ref 5–15)
BUN: 31 mg/dL — AB (ref 6–20)
CALCIUM: 7.4 mg/dL — AB (ref 8.9–10.3)
CHLORIDE: 110 mmol/L (ref 101–111)
CO2: 19 mmol/L — AB (ref 22–32)
CREATININE: 1.62 mg/dL — AB (ref 0.61–1.24)
GFR calc Af Amer: 46 mL/min — ABNORMAL LOW (ref >60)
GFR calc non Af Amer: 40 mL/min — ABNORMAL LOW (ref >60)
GLUCOSE: 97 mg/dL (ref 65–99)
Potassium: 4.4 mmol/L (ref 3.5–5.1)
Sodium: 138 mmol/L (ref 135–145)

## 2017-08-15 LAB — URINALYSIS, ROUTINE W REFLEX MICROSCOPIC
Bilirubin Urine: NEGATIVE
Glucose, UA: NEGATIVE mg/dL
Ketones, ur: NEGATIVE mg/dL
LEUKOCYTES UA: NEGATIVE
Nitrite: NEGATIVE
PH: 5 (ref 5.0–8.0)
Protein, ur: 30 mg/dL — AB
SPECIFIC GRAVITY, URINE: 1.025 (ref 1.005–1.030)
SQUAMOUS EPITHELIAL / LPF: NONE SEEN

## 2017-08-15 LAB — ABO/RH: ABO/RH(D): O POS

## 2017-08-15 LAB — TYPE AND SCREEN
ABO/RH(D): O POS
ANTIBODY SCREEN: NEGATIVE
Unit division: 0
Unit division: 0

## 2017-08-15 LAB — BLOOD GAS, ARTERIAL
ACID-BASE DEFICIT: 3.3 mmol/L — AB (ref 0.0–2.0)
BICARBONATE: 21.7 mmol/L (ref 20.0–28.0)
DRAWN BY: 244871
Delivery systems: POSITIVE
EXPIRATORY PAP: 7
FIO2: 60
Inspiratory PAP: 14
O2 Saturation: 81.4 %
PATIENT TEMPERATURE: 97.7
pCO2 arterial: 41.5 mmHg (ref 32.0–48.0)
pH, Arterial: 7.335 — ABNORMAL LOW (ref 7.350–7.450)
pO2, Arterial: 46.6 mmHg — ABNORMAL LOW (ref 83.0–108.0)

## 2017-08-15 LAB — CBC
HEMATOCRIT: 43.1 % (ref 39.0–52.0)
Hemoglobin: 14.1 g/dL (ref 13.0–17.0)
MCH: 32.6 pg (ref 26.0–34.0)
MCHC: 32.7 g/dL (ref 30.0–36.0)
MCV: 99.8 fL (ref 78.0–100.0)
Platelets: 159 10*3/uL (ref 150–400)
RBC: 4.32 MIL/uL (ref 4.22–5.81)
RDW: 15.1 % (ref 11.5–15.5)
WBC: 8.8 10*3/uL (ref 4.0–10.5)

## 2017-08-15 LAB — PREPARE RBC (CROSSMATCH)

## 2017-08-15 LAB — TROPONIN I
TROPONIN I: 4.57 ng/mL — AB (ref <0.03)
Troponin I: 1.16 ng/mL (ref <0.03)
Troponin I: 3.61 ng/mL (ref <0.03)

## 2017-08-15 LAB — PHOSPHORUS: Phosphorus: 3 mg/dL (ref 2.5–4.6)

## 2017-08-15 LAB — MAGNESIUM: Magnesium: 1.6 mg/dL — ABNORMAL LOW (ref 1.7–2.4)

## 2017-08-15 LAB — LACTIC ACID, PLASMA
Lactic Acid, Venous: 3 mmol/L (ref 0.5–1.9)
Lactic Acid, Venous: 1 mmol/L (ref 0.5–1.9)

## 2017-08-15 LAB — CORTISOL: Cortisol, Plasma: 18.4 ug/dL

## 2017-08-15 LAB — MRSA PCR SCREENING: MRSA BY PCR: NEGATIVE

## 2017-08-15 LAB — PROCALCITONIN: PROCALCITONIN: 0.14 ng/mL

## 2017-08-15 MED ORDER — MAGNESIUM SULFATE 2 GM/50ML IV SOLN
2.0000 g | Freq: Once | INTRAVENOUS | Status: AC
  Administered 2017-08-15: 2 g via INTRAVENOUS
  Filled 2017-08-15: qty 50

## 2017-08-15 MED ORDER — FUROSEMIDE 10 MG/ML IJ SOLN
40.0000 mg | Freq: Once | INTRAMUSCULAR | Status: AC
  Administered 2017-08-15: 40 mg via INTRAVENOUS
  Filled 2017-08-15: qty 4

## 2017-08-15 MED ORDER — SODIUM CHLORIDE 0.9 % IV SOLN: 1250.0000 mg | INTRAVENOUS | Status: DC

## 2017-08-15 MED ORDER — PIPERACILLIN-TAZOBACTAM 3.375 G IVPB 30 MIN
3.3750 g | Freq: Once | INTRAVENOUS | Status: AC
  Administered 2017-08-15: 3.375 g via INTRAVENOUS
  Filled 2017-08-15: qty 50

## 2017-08-15 MED ORDER — ALPRAZOLAM 0.25 MG PO TABS
0.2500 mg | ORAL_TABLET | Freq: Once | ORAL | Status: AC
  Administered 2017-08-15: 0.25 mg via ORAL
  Filled 2017-08-15 (×2): qty 1

## 2017-08-15 MED ORDER — ORAL CARE MOUTH RINSE
15.0000 mL | Freq: Two times a day (BID) | OROMUCOSAL | Status: DC
  Administered 2017-08-15 – 2017-08-27 (×24): 15 mL via OROMUCOSAL

## 2017-08-15 MED ORDER — PIPERACILLIN-TAZOBACTAM 3.375 G IVPB
3.3750 g | Freq: Three times a day (TID) | INTRAVENOUS | Status: DC
  Administered 2017-08-15 – 2017-08-16 (×4): 3.375 g via INTRAVENOUS
  Filled 2017-08-15 (×5): qty 50

## 2017-08-15 MED ORDER — SODIUM CHLORIDE 0.9 % IV SOLN: 250.0000 mL | INTRAVENOUS | Status: DC | PRN

## 2017-08-15 MED ORDER — SODIUM BICARBONATE 8.4 % IV SOLN
100.0000 meq | Freq: Once | INTRAVENOUS | Status: AC
  Administered 2017-08-15: 100 meq via INTRAVENOUS
  Filled 2017-08-15: qty 100

## 2017-08-15 MED ORDER — ONDANSETRON HCL 4 MG/2ML IJ SOLN: 4.0000 mg | Freq: Four times a day (QID) | INTRAMUSCULAR | Status: DC | PRN

## 2017-08-15 MED ORDER — ACETAMINOPHEN 325 MG PO TABS
650.0000 mg | ORAL_TABLET | ORAL | Status: DC | PRN
  Administered 2017-08-16 – 2017-08-17 (×2): 650 mg via ORAL
  Filled 2017-08-15 (×2): qty 2

## 2017-08-15 MED ORDER — VANCOMYCIN HCL 10 G IV SOLR
2000.0000 mg | Freq: Once | INTRAVENOUS | Status: AC
  Administered 2017-08-15: 2000 mg via INTRAVENOUS
  Filled 2017-08-15: qty 2000

## 2017-08-15 MED ORDER — ACETAMINOPHEN 10 MG/ML IV SOLN
1000.0000 mg | Freq: Four times a day (QID) | INTRAVENOUS | Status: AC | PRN
  Administered 2017-08-15: 1000 mg via INTRAVENOUS
  Filled 2017-08-15: qty 100

## 2017-08-15 MED ORDER — MORPHINE SULFATE (PF) 4 MG/ML IV SOLN
2.0000 mg | INTRAVENOUS | Status: DC | PRN
  Administered 2017-08-15 – 2017-08-17 (×16): 2 mg via INTRAVENOUS
  Filled 2017-08-15 (×16): qty 1

## 2017-08-15 NOTE — Progress Notes (Signed)
[facility] Progress Note Patient Name: Wayne Torres DOB: 22-Jun-1940 MRN: [id number]   Date of Service  08/15/2017  HPI/Events of Note  Request for home Xanax.   eICU Interventions  Will order: 1.Xanax 0.25 mg PO X 1 now.      Intervention Category Minor Interventions: Agitation / anxiety - evaluation and management  Vennela Jutte [place] 08/15/2017, 10:45 PM

## 2017-08-15 NOTE — Progress Notes (Signed)
Pt's increasing Troponin were called to Cardiology. Dr Elsworth Soho is also aware of those.  See new orders

## 2017-08-15 NOTE — Progress Notes (Signed)
Pharmacy Antibiotic Note  Wayne Torres is a 77 y.o. male admitted on 08/14/2017 with sepsis.  Pharmacy has been consulted for Vancocin and Zosyn dosing.  Baseline SCr ~1.3, currently 2.  Plan: Vancomycin 2042m x1 then 12537mIV every 24 hours.  Goal trough 15-20 mcg/mL. Zosyn 3.375g IV q8h (4 hour infusion).  Height: 6' (182.9 cm) Weight: 210 lb (95.3 kg) IBW/kg (Calculated) : 77.6  Temp (24hrs), Avg:97.8 F (36.6 C), Min:97.2 F (36.2 C), Max:98.2 F (36.8 C)  Recent Labs  Lab 08/14/17 2226 08/14/17 2232  WBC 15.3*  --   CREATININE 2.08*  --   LATICACIDVEN  --  3.79*    Estimated Creatinine Clearance: 36.8 mL/min (A) (by C-G formula based on SCr of 2.08 mg/dL (H)).    Allergies  Allergen Reactions  . Ofev [Nintedanib] Other (See Comments)    Diarrhea  Diarrhea   . Pirfenidone Nausea And Vomiting and Nausea Only     Thank you for allowing pharmacy to be a part of this patient's care.  VeWynona NeatPharmD, BCPS  08/15/2017 1:27 AM

## 2017-08-15 NOTE — Progress Notes (Addendum)
77 year old with IPF, chronic systolic heart failure EF 30% status post CABG and PPM with chronic hypoxic respiratory failure maintained on 4 L of oxygen. He has been having frequent falls and 4 days ago dislocated his right ankle after such a fall.  He has fallen 2 more times since. Admitted 3/23 with shock and increasing hypoxia, chest x-ray showing slight worsening of his bilateral chronic infiltrates Fecal occult blood was positive but hemoglobin was 13.4, received 2 units PRBC anyways with hemoglobin 14.1 troponins have trended up to peak 3.6, he denies chest pain. Lactic acidosis is resolved.  Required transient BiPAP but is now on high flow oxygen pro-calcitonin 0.14. On exam-bibasilar crackles, decreased work of breathing, mild accessory muscle usage, S1-S2 paced wide complex on monitor, right ankle wrapped, no edema.  Impression/plan Acute on chronic hypoxic respiratory failure-no obvious fluid overload, BNP would be difficult to interpret this setting, could simply be worsening pulmonary fibrosis, has not tolerated O FEV or pirfenidone in the past, DNR appropriate in the setting Doubt pneumonia, pro calcitonin is low, MRSA PCR is negative, will discontinue vancomycin and could stop Zosyn soon  Chronic systolic heart failure/demand ischemia-due to concern for GI bleeding, will hold aspirin and heparin, obtain cardiology input.  Upper GI bleed -GI input if hemoglobin drops further, Protonix drip for now  Updated patient and wife at bedside  Kara Mead MD. Shade Flood. [facility] Pager [phone] If no response call [phone]   08/15/2017

## 2017-08-15 NOTE — Progress Notes (Signed)
Bicarb given per RN [phone]. ABG repeat after an hour of administration per MD Hammonds. Report given to admitting unit RRT. Updated RRT that MD Hammonds wants HFNC on patient once admitted to the ICU.

## 2017-08-15 NOTE — Progress Notes (Signed)
Cardiology Consultation:   Patient ID: Wayne Torres   Admit date: 08/14/2017 Date of Consult: 08/15/2017  Primary Care Provider: [name], PA-C Primary Cardiologist: Dr. Dorris Carnes Primary Electrophysiologist:  Dr. Virl Axe   Patient Profile:   Wayne Torres is a 77 y.o. male with a history of IPF, hypertension, hyperlipidemia, and complete heart block status post pacemaker who is being seen today for the evaluation of abnormal troponin I at the request of Dr. Elsworth Soho.  History of Present Illness:   Wayne Torres is currently admitted to the hospital after recent falls.  He was reportedly ambulating in the hall at his home on his typical supplemental oxygen, fell suddenly due to  weakness, but no reported loss of consciousness. This resulted in a right ankle fracture.  He was noted to be hypotensive and hypoxic on presentation in the ER.  He reports having frequent falls of late, although not associated with frank syncope.  Chest x-ray does show worsening chronic infiltrates with known IPF.  He also has heme positive stools, although at this point hemoglobin is normal. He does not report chest pain or palpitations.  Last ischemic testing was in November 2018.  Lexiscan Myoview at that time showed a large region of inferior/inferolateral scar without active ischemia and LVEF 28%.  He has been managed medically.  I do not see a recent pacemaker interrogation in Epic.  Past Medical History:  Diagnosis Date  . Allergic rhinitis   . Anxiety   . Back pain   . Bipolar disorder ([place])   . Chronic bronchitis ([place]) 02/07/2012  . Complete heart block (H[facility])    a. s/p SJM Accent dc ppm, ser # [id number]  . Coronary artery disease s/p CABG 1990s    a. approx 1994 s/p CABG x 4 (LIMA->LAD, VG->RCA, VG->RI, VG->Diag);  b. 11/2008 Cath: LM 100, LAD small, RCA 50-60p, 100d, VG->RCA 100 ost, VG->RI 54m VG->Diag 40 - upper branch of diag small, 99%, LIMA-LAD patent.;  c. 08/2011  Echo: EF 45-50%, mod dil LA,/RA/RV with mild-mod reduced RV fxn.  . Depression   . ED (erectile dysfunction)   . Fatigue   . GERD (gastroesophageal reflux disease)   . GI bleed   . Hip pain, right   . History of kidney stones   . HTN (hypertension)   . Hyperlipidemia   . Lumbar disc disease   . Malignant neoplasm of prostate ([place]   . Osteoarthritis   . Peptic stricture of esophagus   . Pneumonia 1993   Post-op  . Pulmonary fibrosis, postinflammatory ([place]    Stopped Cellcept 02/13/13 d/t cost   . PVD (peripheral vascular disease) ([place]    a. 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  bilat ext iliac dzs.  . Shoulder joint pain     Past Surgical History:  Procedure Laterality Date  . CORONARY ANGIOPLASTY  06/03/2007, 01/25/2009  . CORONARY ARTERY BYPASS GRAFT  20 yrs ago   LIMA to LAD, SVG to PDA, SVG to Ramus, SVG to D  . CYSTOSCOPY    . LEFT AND RIGHT HEART CATHETERIZATION WITH CORONARY ANGIOGRAM N/A 07/05/2012   Procedure: LEFT AND RIGHT HEART CATHETERIZATION WITH CORONARY ANGIOGRAM;  Surgeon: Peter M JMartinique MD;  [facility];  Service: Cardiovascular;  Laterality: N/A;  . NASAL SEPTUM SURGERY  april 2012   dr britt, [facility] [place]  . PACEMAKER PLACEMENT  08/2011  . PERMANENT PACEMAKER INSERTION  N/A 09/14/2011   Procedure: PERMANENT PACEMAKER INSERTION;  Surgeon: Deboraha Sprang, MD;  Location: [facility] CATH LAB;  Service: Cardiovascular;  Laterality: N/A;  . VIDEO BRONCHOSCOPY WITH ENDOBRONCHIAL ULTRASOUND Left 09/05/2014   Procedure: VIDEO BRONCHOSCOPY WITH ENDOBRONCHIAL ULTRASOUND;  Surgeon: Collene Gobble, MD;  Location: [facility] OR;  Service: Thoracic;  Laterality: Left;     Inpatient Medications: Scheduled Meds: . mouth rinse  15 mL Mouth Rinse BID   Continuous Infusions: . sodium chloride    . sodium chloride    . acetaminophen Stopped (08/15/17 0530)  . pantoprozole (PROTONIX) infusion 8 mg/hr (08/15/17 1014)  .  piperacillin-tazobactam (ZOSYN)  IV Stopped (08/15/17 1300)   PRN Meds: sodium chloride, acetaminophen, acetaminophen, morphine injection, ondansetron (ZOFRAN) IV  Allergies:    Allergies  Allergen Reactions  . Ofev [Nintedanib] Other (See Comments)    Diarrhea  Diarrhea   . Pirfenidone Nausea And Vomiting and Nausea Only    Social History:   Social History   Socioeconomic History  . Marital status: Married    Spouse name: Not on file  . Number of children: 2  . Years of education: Not on file  . Highest education level: Not on file  Occupational History  . Occupation: Retired  Scientific laboratory technician  . Financial resource strain: Not on file  . Food insecurity:    Worry: Not on file    Inability: Not on file  . Transportation needs:    Medical: Not on file    Non-medical: Not on file  Tobacco Use  . Smoking status: Former Smoker    Packs/day: 1.50    Years: 42.00    Pack years: 63.00    Types: Cigarettes    Last attempt to quit: 09/11/2001    Years since quitting: 15.9  . Smokeless tobacco: Never Used  . Tobacco comment: Pt does not get regular exercise  Substance and Sexual Activity  . Alcohol use: No  . Drug use: No  . Sexual activity: Yes  Lifestyle  . Physical activity:    Days per week: Not on file    Minutes per session: Not on file  . Stress: Not on file  Relationships  . Social connections:    Talks on phone: Not on file    Gets together: Not on file    Attends religious service: Not on file    Active member of club or organization: Not on file    Attends meetings of clubs or organizations: Not on file    Relationship status: Not on file  . Intimate partner violence:    Fear of current or ex partner: Not on file    Emotionally abused: Not on file    Physically abused: Not on file    Forced sexual activity: Not on file  Other Topics Concern  . 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   +Hx of smoking, quit about 2003.    Family History:   The patient's family history includes Heart attack in his father; Heart disease in his father. There is no history of Colon cancer or Cancer.  ROS:  Please see the history of present illness.  Frequent falls, no palpitations, chest pain, syncope.  All other ROS reviewed and negative.     Physical Exam/Data:   Vitals:   08/15/17 1100 08/15/17 1137 08/15/17 1200 08/15/17 1210  BP: 93/65  91/61   Pulse: 72  72   Resp: (!) 27  20   Temp:    98.1 F (36.7 C)  TempSrc:    Axillary  SpO2: 96% 97% 94%   Weight:      Height:        Intake/Output Summary (Last 24 hours) at 09/02/17 1257 Last data filed at 2017-09-02 1200 Gross per 24 hour  Intake 3095 ml  Output 400 ml  Net 2695 ml   Filed Weights   08/14/17 2157 09-02-17 0500  Weight: 210 lb (95.3 kg) 225 lb 1.4 oz (102.1 kg)   Body mass index is 30.53 kg/m.   Gen: Elderly male.  Wearing oxygen via nasal cannula. HEENT: Conjunctiva and lids normal, oropharynx clear. Neck: Supple, no carotid bruits, no thyromegaly. Lungs: Coarse breath sounds with "dry" crackles throughout, no wheezing. Cardiac: Regular rate and rhythm, no S3, 2/6 systolic murmur. Abdomen: Soft, nontender, bowel sounds present. Extremities: Right leg in cast, leg edema present, distal pulses 1+. Skin: Warm and dry. Musculoskeletal: No kyphosis. Neuropsychiatric: Alert and oriented x3, affect grossly appropriate.  EKG:  I personally reviewed the tracing from 07/17/2017 which showed a ventricular paced rhythm.  Telemetry:  I personally reviewed telemetry which shows reticular pacing.  Relevant CV Studies:  Lexiscan Myoview 04/14/2017:  Nuclear stress EF: 28%.  There was no ST segment deviation noted during stress.  Findings consistent with prior myocardial infarction.  This is a high risk study.  The left ventricular ejection fraction is severely decreased (<30%).   Large  inferior to inferior lateral wall infarct from apex to base with no ischemia EF 28% diffuse hypokinesis worse in the inferior wall  Echocardiogram 06/25/2017: Study Conclusions  - Left ventricle: The cavity size was normal. Wall thickness was   increased in a pattern of moderate LVH. Systolic function was   severely reduced. The estimated ejection fraction was in the   range of 25% to 30%. Diffuse hypokinesis. Doppler parameters are   consistent with abnormal left ventricular relaxation (grade 1   diastolic dysfunction). - Ventricular septum: The contour showed diastolic flattening. - Aortic valve: Trileaflet; mildly thickened, mildly calcified   leaflets. - Mitral valve: Mild prolapse, involving the anterior leaflet.   There was trivial regurgitation. - Right ventricle: Pacer wire or catheter noted in right ventricle. - Right atrium: Pacer wire or catheter noted in right atrium. - Tricuspid valve: There was trivial regurgitation.  Impressions:  - When compared to prior, mildly reduced EF.  Laboratory Data:  Chemistry Recent Labs  Lab 08/14/17 2226 09-02-2017 0621  NA 135 138  K 3.8 4.4  CL 109 110  CO2 16* 19*  GLUCOSE 119* 97  BUN 31* 31*  CREATININE 2.08* 1.62*  CALCIUM 7.3* 7.4*  GFRNONAA 29* 40*  GFRAA 34* 46*  ANIONGAP 10 9    Recent Labs  Lab 08/14/17 2226  PROT 5.5*  ALBUMIN 2.7*  AST 29  ALT 11*  ALKPHOS 59  BILITOT 0.8   Hematology Recent Labs  Lab 08/14/17 2226 2017/09/02 0621  WBC 15.3* 8.8  RBC 4.02* 4.32  HGB 13.4 14.1  HCT 41.6 43.1  MCV 103.5* 99.8  MCH 33.3 32.6  MCHC 32.2 32.7  RDW 13.6 15.1  PLT 194 159   Cardiac Enzymes Recent Labs  Lab 08/14/17 2226 09-02-2017 0241 09/02/17 0621  TROPONINI 0.41* 1.16* 3.61*   No results for input(s): TROPIPOC in the last 168 hours.   Radiology/Studies:  US Renal  Result Date: 09/02/2017 CLINICAL DATA:  Acute kidney injury  EXAM: RENAL / URINARY TRACT ULTRASOUND COMPLETE COMPARISON:  None.  FINDINGS: Right Kidney: Length: 11.8 cm. Echogenicity within normal limits. There is mild cortical thinning. No mass or hydronephrosis visualized. Lower pole cyst measures 1.8 x 1.1 x 1.3 cm. Left Kidney: Length: 12.6 cm. Echogenicity within normal limits. No mass or hydronephrosis visualized. Cyst at the lower pole measures 4.2 x 3.0 x 3.9 cm. Bladder: Appears normal for degree of bladder distention. Other: Small volume right upper quadrant ascites. IMPRESSION: 1. No hydronephrosis or other acute abnormality. 2. Small volume right upper quadrant ascites. Electronically Signed   By: Ulyses Jarred M.D.   On: 08/15/2017 05:59   Dg Chest Portable 1 View  Result Date: 08/14/2017 CLINICAL DATA:  Shortness of breath EXAM: PORTABLE CHEST 1 VIEW COMPARISON:  04/05/2017. FINDINGS: Low lung volumes. The cardio pericardial silhouette is enlarged. There is pulmonary vascular congestion without overt pulmonary edema. Underlying chronic interstitial changes again noted with bibasilar atelectasis/infiltrate. Left-sided permanent pacemaker again noted. Telemetry leads and defibrillator pads overlie the chest. Bones are demineralized. IMPRESSION: Cardiomegaly with low lung volumes and bibasilar atelectasis/infiltrate. Vascular congestion.  Component of interstitial edema not excluded. Underlying chronic interstitial lung disease. Electronically Signed   By: Misty Stanley M.D.   On: 08/14/2017 23:29    Assessment and Plan:   1.  NSTEMI, at this point suspect type II event due to physiologic stressors and in the absence of angina symptoms.  ECG shows ventricular pacing.  2.  Multivessel CAD status post CABG in 1994 (LIMA->LAD, VG->RCA, VG->RI, VG->Diag).  Lexiscan Myoview from November 2018 revealed large region of inferior/inferolateral scar but no active ischemia and LVEF 28%.  He has been managed medically.  3.  Ischemic cardiomyopathy, LVEF 25-30% by echocardiogram in February 2019.  No obvious history of ventricular  arrhythmias.  4.  Complete heart block with [employer] pacemaker in place.  Patient follows with Dr. Caryl Comes.  5.  Pulmonary fibrosis with chronic hypoxic respiratory failure, on oxygen at home.  6.  Essential hypertension.  Current blood pressure is low to low normal.  7.  Heme positive stools with suspected upper GI bleed although hemoglobin normal at this time.  Patient currently receiving supportive measures and antibiotics per critical care team.  For the time being he has been taking off of aspirin, Norvasc, lisinopril, Toprol-XL, and Zocor which were his home medications.  Would continue to trend troponin I levels to peak, although hold off on heparin in light of concerns about possible developing GI bleed and in the absence of chest discomfort.  Follow-up limited echocardiogram is been ordered by primary team.  We will get pacemaker interrogated to ensure normal function.  We will continue to follow with you.   Signed, Rozann Lesches, MD  08/15/2017 12:57 PM

## 2017-08-15 NOTE — H&P (Signed)
PULMONARY / CRITICAL CARE MEDICINE   Name: Wayne Torres MRN: [id number] DOB: 06-11-1940    ADMISSION DATE:  08/14/2017  REFERRING MD:  EDP , DR Gilford Raid   CHIEF COMPLAINT:  Dyspnea , hypotension   HISTORY OF PRESENT ILLNESS:   77 yo male former smoker with multiple comorbidities with IPF , chronic respiratory failure on home oxygen at 4l/m , CHF (EF30%), CAD s/p CABG, s/p pacemaker , HTN , previous lung cancer s/p SBRT presented to ER on 08/14/17 after witnessed collapse at home . Pt was walking to [place] with assistance of wife Gilford Rile and he became very weak all over lowering to floor. EMS was called and found to be hypotensive and hypoxic on 4l/m . He was brought to ER found to have metabolic acidosis with pH at 7.198, PCo2 43, PO2 62,HCO3 17. He was placed on BIPAP . CHest xray showed IPF with vascular congestion .  He was given 1 L IVF for hypotension . His stools were guaiac positive and was given 2 u PRBCs. . Blood pressure since arrival to ER has improved slightly . He has intermittent episodes of hypoxia on BIPAP . He is alert and answers questions pt is accompanied by his wife and pastor . Wife says he has frequent falls at home . He haf a fall 3/21 with right ankle fracture . Seen by ortho 3/22 with LE leg cast. Placed . Has ongoing pain , using hydrocodone . Discussion with patient and Dr. Phylliss Bob  regarding his wishes , he does not want BIPAP and wishes for DNR/DNI status . Wife and pastor at bedside and aware of pt wishes  Pt is not on anticoagulation , no plavix or aspirin . Not taking NSAIDS . At baseline is is dyspneic with activity . O2 sats do drop some on 4l/m . He has ongoing balance issues and has had several falls at home over the past several months to year per wife . They were not aware of any bleeding or melena .  No stomach pain , diarrhea or vomiting . Does have nausea especially when he takes OFEV for his IPF.   He is followed by Cardiology with known CHF , CAD s/p  CABG and pacemaker.  Denies chest pain , palpitations or syncope . Denies calf pain or hemoptysis .   He will require hospitalization and admission to ICU. For now will given 2 amps of HCO3 and reevaluate ABG in 1 hr. Remove BIPAP per pt request and place on O2 high flow to keep O2 sats >88-90 % .    PAST MEDICAL HISTORY :  He  has a past medical history of Allergic rhinitis, Allergic rhinitis, cause unspecified (02/28/2013), Anxiety, Back pain, Bipolar disorder ([place]), Chronic bronchitis ([place]) (02/07/2012), Complete heart block ([place]), Coronary artery disease s/p CABG 1990s, Depression, ED (erectile dysfunction), Fatigue, GERD (gastroesophageal reflux disease), GI bleed, Hip pain, right, History of kidney stones, HTN (hypertension), Hyperlipidemia, Lumbar disc disease, Malignant neoplasm of prostate ([place]), Osteoarthritis, Peptic stricture of esophagus, Pneumonia (1993), PSA (psoriatic arthritis) ([place]), Pulmonary fibrosis, postinflammatory ([place]), PVD (peripheral vascular disease) ([place]), Shortness of breath dyspnea, and Shoulder joint pain.  PAST SURGICAL HISTORY: He  has a past surgical history that includes Coronary angioplasty (06/03/2007, 01/25/2009); Nasal septum surgery (april 2012); Coronary artery bypass graft (20 yrs ago); pacemaker placement (08/2011); permanent pacemaker insertion (N/A, 09/14/2011); left and right heart catheterization with coronary angiogram (N/A, 07/05/2012); Cystoscopy; and Video bronchoscopy with endobronchial ultrasound (Left, 09/05/2014).  Allergies  Allergen  Reactions  . Ofev [Nintedanib] Other (See Comments)    Diarrhea  Diarrhea   . Pirfenidone Nausea And Vomiting and Nausea Only    No current facility-administered medications on file prior to encounter.    Current Outpatient Medications on File Prior to Encounter  Medication Sig  . acetaminophen (TYLENOL) 500 MG tablet Take 1,000 mg by mouth every 6 (six) hours as needed for moderate pain or headache.  . ALPRAZolam  (XANAX) 0.25 MG tablet TAKE 1 TABLET BY MOUTH EVERY DAY AS NEEDED  . amLODipine (NORVASC) 5 MG tablet Take 2.5 mg daily by mouth.  Marland Kitchen aspirin EC 81 MG tablet Take 1 tablet (81 mg total) daily by mouth.  . citalopram (CELEXA) 20 MG tablet Take 1 tablet (20 mg total) by mouth daily.  . fluticasone (FLONASE) 50 MCG/ACT nasal spray Place 2 sprays into the nose daily. Reported on 05/10/2015  . gabapentin (NEURONTIN) 300 MG capsule Take 300 mg by mouth 3 (three) times daily.   Marland Kitchen HYDROcodone-acetaminophen (NORCO/VICODIN) 5-325 MG tablet Take 1 tablet by mouth every 4 (four) hours.  Marland Kitchen lisinopril (PRINIVIL,ZESTRIL) 2.5 MG tablet Take 1 tablet (2.5 mg total) daily by mouth.  . metoprolol succinate (TOPROL-XL) 25 MG 24 hr tablet Take 1/2 tablet (12.5 mg) by mouth twice daily  . Multiple Vitamins-Minerals (PRESERVISION AREDS PO) Take 1 tablet by mouth 2 (two) times daily.  . nitroGLYCERIN (NITROSTAT) 0.4 MG SL tablet Place 1 tablet (0.4 mg total) under the tongue every 5 (five) minutes as needed for chest pain.  Marland Kitchen OFEV 100 MG CAPS Take 100 mg by mouth 2 (two) times daily.  Marland Kitchen omeprazole (PRILOSEC) 40 MG capsule TAKE ONE CAPSULE BY MOUTH EVERY DAY  . simvastatin (ZOCOR) 40 MG tablet TAKE 1 TABLET BY MOUTH AT BEDTIME  . amoxicillin-clavulanate (AUGMENTIN) 875-125 MG tablet Take 1 tablet by mouth 2 (two) times daily. (Patient not taking: Reported on 08/15/2017)  . doxycycline (VIBRA-TABS) 100 MG tablet Take 1 tablet (100 mg total) 2 (two) times daily by mouth. (Patient not taking: Reported on 08/15/2017)  . OXYGEN Inhale into the lungs. 3 lpm qhs  4 lpm pulsed with exertion as needed DME:  . predniSONE (DELTASONE) 10 MG tablet 58mX3 days, 365mX3 days, 2037m3 days, 50m17mdays, then stop. (Patient not taking: Reported on 08/15/2017)    FAMILY HISTORY:  His indicated that his mother is deceased. He indicated that his father is deceased. He indicated that his maternal grandmother is deceased. He indicated that  his maternal grandfather is deceased. He indicated that his paternal grandmother is deceased. He indicated that his paternal grandfather is deceased. He indicated that the status of his neg hx is unknown.   SOCIAL HISTORY: He  reports that he quit smoking about 15 years ago. His smoking use included cigarettes. He has a 63.00 pack-year smoking history. He has never used smokeless tobacco. He reports that he does not drink alcohol or use drugs.  REVIEW OF SYSTEMS:   Constitutional:   No  weight loss, night sweats,  Fevers, chills, + fatigue, or  lassitude.  HEENT:   No headaches,  Difficulty swallowing,  Tooth/dental problems, or  Sore throat,                No sneezing, itching, ear ache, nasal congestion, post nasal drip,   CV:  No chest pain,  Orthopnea, PND, swelling in lower extremities, anasarca, dizziness, palpitations, syncope.   GI  No heartburn, indigestion, abdominal pain,   vomiting, diarrhea,  change in bowel habits, loss of appetite, bloody stools. +nausea with OFEV   Resp:+ shortness of breath with exertion  No excess mucus, no productive cough,  No non-productive cough,  No coughing up of blood.  No change in color of mucus.  No wheezing.  No chest wall deformity  Skin: no rash or lesions.  GU: no dysuria, change in color of urine, no urgency or frequency.  No flank pain, no hematuria   MS:  + right ankle fx , + pain   Psych:  No change in mood or affect. No depression or anxiety.  No memory loss.  Neuo: +balance issues , frequent fall       SUBJECTIVE:  Want this mask off   VITAL SIGNS: BP 92/64   Pulse 86   Temp (!) 97.2 F (36.2 C) (Oral)   Resp (!) 24   Ht 6' (1.829 m)   Wt 210 lb (95.3 kg)   SpO2 (!) 82%   BMI 28.48 kg/m   HEMODYNAMICS:    VENTILATOR SETTINGS: Vent Mode: BIPAP;PSV FiO2 (%):  [50 %-60 %] 60 % PEEP:  [7 cmH20] 7 cmH20 Pressure Support:  [14 cmH20] 14 cmH20  INTAKE / OUTPUT: No intake/output data recorded.  PHYSICAL  EXAMINATION: General:  Elderly male on BIPAP  Neuro:  Alert , F/C MAE  HEENT:  BIPAP in place , AT [facility]  Cardiovascular:  RRR , Gr 1 SM , Lungs:  BB crackles  Abdomen:  Soft, BS + x 4 , NT , no gurading  Musculoskeletal:  Right LE case , toes warm, pulses intact  Skin:  Intact   LABS:  BMET Recent Labs  Lab 08/14/17 2226  NA 135  K 3.8  CL 109  CO2 16*  BUN 31*  CREATININE 2.08*  GLUCOSE 119*    Electrolytes Recent Labs  Lab 08/14/17 2226  CALCIUM 7.3*    CBC Recent Labs  Lab 08/14/17 2226  WBC 15.3*  HGB 13.4  HCT 41.6  PLT 194    Coag's Recent Labs  Lab 08/14/17 2305  INR 1.27    Sepsis Markers Recent Labs  Lab 08/14/17 2232  LATICACIDVEN 3.79*    ABG Recent Labs  Lab 08/14/17 2234  PHART 7.198*  PCO2ART 43.8  PO2ART 62.0*    Liver Enzymes Recent Labs  Lab 08/14/17 2226  AST 29  ALT 11*  ALKPHOS 59  BILITOT 0.8  ALBUMIN 2.7*    Cardiac Enzymes Recent Labs  Lab 08/14/17 2226  TROPONINI 0.41*    Glucose No results for input(s): GLUCAP in the last 168 hours.  Imaging Dg Chest Portable 1 View  Result Date: 08/14/2017 CLINICAL DATA:  Shortness of breath EXAM: PORTABLE CHEST 1 VIEW COMPARISON:  04/05/2017. FINDINGS: Low lung volumes. The cardio pericardial silhouette is enlarged. There is pulmonary vascular congestion without overt pulmonary edema. Underlying chronic interstitial changes again noted with bibasilar atelectasis/infiltrate. Left-sided permanent pacemaker again noted. Telemetry leads and defibrillator pads overlie the chest. Bones are demineralized. IMPRESSION: Cardiomegaly with low lung volumes and bibasilar atelectasis/infiltrate. Vascular congestion.  Component of interstitial edema not excluded. Underlying chronic interstitial lung disease. Electronically Signed   By: Misty Stanley M.D.   On: 08/14/2017 23:29     STUDIES:  3/23 Echo >>  CULTURES: 3/23 BC >> 3/23 UC>>  ANTIBIOTICS: 3/23 Vanc >> 3/23 Zosyn  >>  SIGNIFICANT EVENTS: 3/23 ER admit with resp distress  3/23 DNR/DNI/NO BIPAP   LINES/TUBES:   DISCUSSION: 77 yo male former  smoker with underlying IPF , CHF , previous lung cancer s/p SBRT with recent right ankle fracture presented to ER 3/23 with hypotension, metabolic acidosis and acute on chronic respiratory failure . Treated with IVF and 2 u PRBC for guaic stools now with volume overload. He was treated with BIPAP for increased wob however pt wants DNR/DNI and no BIPAP .   ASSESSMENT / PLAN:  PULMONARY A: Acute on Chronic hypoxic Respiratory Failure  Pulmonary edema +/- PNA  Pulmonary Fibrosis with home O2 at 4l/m at baseline  Hx of Lung Cancer s/p SBRT  P:   Check cxr in am  O2 to keep O2 sats >88-90% Pt wishes for DNR/DNI, no BIPAP  Begin IV abx  Repeat ABG   CARDIOVASCULAR A:  CHF (echo 06/2017 EF 25-30%)  Hypotension  S/p Pacemaker  P:  Hold on lasix for now w/hypotension  Check echo  Hold on additional IV  Consider pacemaker interrogation in am   RENAL A:   Metabolic Acidosis  Acute on chronic  Renal Failure  P:   Give 2 amp HCO3  bmet in am    GASTROINTESTINAL A:   Heme positive stools ? Etiology  P:   Begin PPI   HEMATOLOGIC A:   Heme positive Stools  P:  S/p 2 U PRBC in ER 3/23  Trend cbc    INFECTIOUS A:   Leukocytosis /elevated Lactate -SIRS/Sepsis  Possible PNA  P:   Begin IV Abx with Deniece Ree /Zosyn  Pan Culture  Trend lactate  Check PCT   ENDOCRINE A:   No acute issues  P:   Monitor BS on chemistry , add SSI if elevated  Check cortisol   NEUROLOGIC A:   Ankle Pain from fx  P:   Pain management , avoid over sedation .     FAMILY  - Updates: Wife updated at bedside in ER . Discussion with pt at bedside in ER , pt wishes for DNR/DNI status and does not want BIPAP .   - Inter-disciplinary family meet or Palliative Care meeting due by: 3/30   Tammy Parrett NP-C  Pulmonary and [place] Pager: [phone]  08/15/2017, 1:24 AM

## 2017-08-15 NOTE — Progress Notes (Signed)
Pt's family spoke to Dr. Elsworth Soho. Family and pt are very emotional., tearful. Stated "their own pastor will come instead of chaplin on the unit at this time" Emotional support was given.  Pt requires pain medication frequently. Communicated to the MD.

## 2017-08-15 NOTE — Progress Notes (Signed)
Patient's wife requested patient to become a "triple X" due to a family dispute with their estranged daughter, Trequan Marsolek. A list of people that are allowed to visit the patient was provided. The password, [employer], will remain the same. Security was notified and currently have a description of the daughter.

## 2017-08-15 NOTE — Progress Notes (Signed)
Patient arrived in ICU on BIPAP. On arrival he immediately ripped off the BIPAP and said he didn't want it anymore. Patient placed on hiflo nasal cannula 15L 100% FIO2 with Pox in 70's. Changed to Hiflo at 40L with 100% FIO2 with Pox slowly climbing to now 98%. SBP 104 so am hesitant to give any diuretics at this point. Will monitor closely. ABG shows improved acidosis; will order Chest CT now.

## 2017-08-15 NOTE — ED Notes (Signed)
No blood draw at this time,   Pt receiving blood at this time.

## 2017-08-15 NOTE — Progress Notes (Addendum)
[place] Progress Note Patient Name: [name] DOB: 1940/09/07 MRN: [id number]   Date of Service  08/15/2017  HPI/Events of Note  History of IPF and increasing hypoxia in setting of NSTEMI. Request from Dr. Elsworth Soho, who rounded on him today, to give a dose of Lasix if his BP will tolerate. BP = 112/64 now.  eICU Interventions  Will order: 1. Lasix 40 mg IV X 1 now.      Intervention Category Major Interventions: Hypoxemia - evaluation and management  Sommer,Steven Cornelia Copa 08/15/2017, 7:49 PM

## 2017-08-15 NOTE — Progress Notes (Signed)
Increased troponin was reported to Dr. Elsworth Soho. No actions needed at this time.

## 2017-08-15 NOTE — Progress Notes (Signed)
Pt placed on BiPAP per patient and family request. Pt is still have some Respiratory Failure specifically due to hypoxia.

## 2017-08-15 NOTE — Progress Notes (Signed)
Pt transferred from and back from CT, Xray on nonrebreather mask. Pt tolerated well. Placed back to HFNC in the room.

## 2017-08-15 NOTE — Progress Notes (Signed)
Placed STAT orders for labs and meds. Communicated orders to RN directly who said she would get to it as soon as she could.  Placed ABG order and communicated directly to RT to remove patient from BIPAP per his wishes and place on Hiflo instead. Obtain repeat ABG after RN has given the IV Bicarb that has been ordered.

## 2017-08-15 NOTE — Progress Notes (Signed)
Pt is off the NIV machine per MD Hammonds. Venti mask placed back on patient.  MD wants a repeat ABG after an hour or so once the Bicarb is administered. Patient daughter is at the bedside at this time.

## 2017-08-16 ENCOUNTER — Inpatient Hospital Stay (HOSPITAL_COMMUNITY): Payer: Medicare Other

## 2017-08-16 DIAGNOSIS — R609 Edema, unspecified: Secondary | ICD-10-CM

## 2017-08-16 DIAGNOSIS — I21A1 Myocardial infarction type 2: Secondary | ICD-10-CM

## 2017-08-16 DIAGNOSIS — M7989 Other specified soft tissue disorders: Secondary | ICD-10-CM

## 2017-08-16 DIAGNOSIS — R579 Shock, unspecified: Secondary | ICD-10-CM

## 2017-08-16 DIAGNOSIS — I214 Non-ST elevation (NSTEMI) myocardial infarction: Secondary | ICD-10-CM

## 2017-08-16 DIAGNOSIS — I5042 Chronic combined systolic (congestive) and diastolic (congestive) heart failure: Secondary | ICD-10-CM

## 2017-08-16 DIAGNOSIS — I442 Atrioventricular block, complete: Secondary | ICD-10-CM

## 2017-08-16 DIAGNOSIS — M79609 Pain in unspecified limb: Secondary | ICD-10-CM

## 2017-08-16 LAB — BASIC METABOLIC PANEL
ANION GAP: 13 (ref 5–15)
BUN: 18 mg/dL (ref 6–20)
CHLORIDE: 100 mmol/L — AB (ref 101–111)
CO2: 25 mmol/L (ref 22–32)
Calcium: 8.3 mg/dL — ABNORMAL LOW (ref 8.9–10.3)
Creatinine, Ser: 1.45 mg/dL — ABNORMAL HIGH (ref 0.61–1.24)
GFR calc Af Amer: 53 mL/min — ABNORMAL LOW (ref >60)
GFR, EST NON AFRICAN AMERICAN: 46 mL/min — AB (ref >60)
Glucose, Bld: 103 mg/dL — ABNORMAL HIGH (ref 65–99)
Potassium: 3.6 mmol/L (ref 3.5–5.1)
SODIUM: 138 mmol/L (ref 135–145)

## 2017-08-16 LAB — PHOSPHORUS: Phosphorus: 2 mg/dL — ABNORMAL LOW (ref 2.5–4.6)

## 2017-08-16 LAB — PROCALCITONIN: Procalcitonin: 0.25 ng/mL

## 2017-08-16 LAB — OCCULT BLOOD, POC DEVICE: Fecal Occult Bld: POSITIVE — AB

## 2017-08-16 LAB — CBC
HCT: 45.4 % (ref 39.0–52.0)
HEMOGLOBIN: 14.6 g/dL (ref 13.0–17.0)
MCH: 32.4 pg (ref 26.0–34.0)
MCHC: 32.2 g/dL (ref 30.0–36.0)
MCV: 100.7 fL — AB (ref 78.0–100.0)
PLATELETS: 173 10*3/uL (ref 150–400)
RBC: 4.51 MIL/uL (ref 4.22–5.81)
RDW: 14.6 % (ref 11.5–15.5)
WBC: 9.2 10*3/uL (ref 4.0–10.5)

## 2017-08-16 LAB — ECHOCARDIOGRAM LIMITED
HEIGHTINCHES: 72 in
WEIGHTICAEL: 3509.72 [oz_av]

## 2017-08-16 LAB — BLOOD PRODUCT ORDER (VERBAL) VERIFICATION

## 2017-08-16 LAB — TROPONIN I: TROPONIN I: 3.5 ng/mL — AB (ref <0.03)

## 2017-08-16 LAB — MAGNESIUM: MAGNESIUM: 1.4 mg/dL — AB (ref 1.7–2.4)

## 2017-08-16 MED ORDER — FUROSEMIDE 10 MG/ML IJ SOLN
40.0000 mg | Freq: Once | INTRAMUSCULAR | Status: AC
Start: 2017-08-16 — End: 2017-08-16
  Administered 2017-08-16: 40 mg via INTRAVENOUS
  Filled 2017-08-16: qty 4

## 2017-08-16 NOTE — Progress Notes (Signed)
PULMONARY / CRITICAL CARE MEDICINE   Name: Wayne Torres MRN: [id number] DOB: [birth year]    ADMISSION DATE:  08/14/2017  REFERRING MD:  EDP , DR Gilford Raid   CHIEF COMPLAINT:  Dyspnea , hypotension   HISTORY OF PRESENT ILLNESS:   77 year old with IPF, chronic systolic heart failure EF 30% status post CABG and PPM with chronic hypoxic respiratory failure maintained on 4 L of oxygen. He has been having frequent falls and 4 days ago dislocated his right ankle after such a fall.  He has fallen 2 more times since. Admitted 3/23 with shock and increasing hypoxia, chest x-ray showing slight worsening of his bilateral chronic infiltrates Fecal occult blood was positive but hemoglobin was 13.4, received 2 units PRBC anyways with hemoglobin 14.1 troponins have trended up to peak 3.6, he denies chest pain.   Required transient BiPAP but is now on high flow oxygen pro-calcitonin 0.14.   SUBJECTIVE:  No sig change overnight.  Off bipap, on HFNC.  Feels better.  Wife at bedside.   VITAL SIGNS: BP 101/86   Pulse 85   Temp 98 F (36.7 C) (Oral)   Resp 18   Ht 6' (1.829 m)   Wt 99.5 kg (219 lb 5.7 oz)   SpO2 92%   BMI 29.75 kg/m   HEMODYNAMICS:    VENTILATOR SETTINGS: FiO2 (%):  [35 %-60 %] 35 %  INTAKE / OUTPUT: I/O last 3 completed shifts: In: 3670 [I.V.:1782.5; IV Piggyback:1887.5] Out: 5045 [Urine:5045]  PHYSICAL EXAMINATION: General:  Elderly male NAD on HFNC.  Hard of hearing  Neuro:  Alert , F/C MAE  HEENT:  Mm moist, no JVD  Cardiovascular:  RRR , Gr 1 SM , Lungs:  resps even non labored on HFNC 35L, BB crackles  Abdomen:  Soft, BS + x 4 , NT , no gurading  Musculoskeletal:  Right LE cast , toes warm, pulses intact  Skin:  Intact   LABS:  BMET Recent Labs  Lab 08/14/17 2226 08/15/17 0621 08/16/17 0525  NA 135 138 138  K 3.8 4.4 3.6  CL 109 110 100*  CO2 16* 19* 25  BUN 31* 31* 18  CREATININE 2.08* 1.62* 1.45*  GLUCOSE 119* 97 103*     Electrolytes Recent Labs  Lab 08/14/17 2226 08/15/17 0621 08/16/17 0525  CALCIUM 7.3* 7.4* 8.3*  MG  --  1.6* 1.4*  PHOS  --  3.0 2.0*    CBC Recent Labs  Lab 08/14/17 2226 08/15/17 0621 08/16/17 0525  WBC 15.3* 8.8 9.2  HGB 13.4 14.1 14.6  HCT 41.6 43.1 45.4  PLT 194 159 173    Coag's Recent Labs  Lab 08/14/17 2305  INR 1.27    Sepsis Markers Recent Labs  Lab 08/14/17 2232 08/15/17 0241 08/15/17 0621 08/16/17 0525  LATICACIDVEN 3.79* 3.0* 1.0  --   PROCALCITON  --  0.14  --  0.25    ABG Recent Labs  Lab 08/14/17 2234 08/15/17 0330  PHART 7.198* 7.335*  PCO2ART 43.8 41.5  PO2ART 62.0* 46.6*    Liver Enzymes Recent Labs  Lab 08/14/17 2226  AST 29  ALT 11*  ALKPHOS 59  BILITOT 0.8  ALBUMIN 2.7*    Cardiac Enzymes Recent Labs  Lab 08/15/17 0621 08/15/17 1258 08/16/17 0525  TROPONINI 3.61* 4.57* 3.50*    Glucose No results for input(s): GLUCAP in the last 168 hours.  Imaging Dg Chest Port 1 View  Result Date: 08/16/2017 CLINICAL DATA:  Respiratory failure. EXAM: PORTABLE CHEST  1 VIEW COMPARISON:  Radiograph of August 14, 2017. FINDINGS: Stable cardiomegaly. Status post coronary artery bypass graft. Atherosclerosis of thoracic aorta is noted. Left-sided pacemaker is unchanged in position. Stable interstitial densities are noted bilaterally. Stable left basilar airspace opacity is noted. Bony thorax is unremarkable. IMPRESSION: Stable bilateral interstitial densities are noted which may represent chronic disease, with stable left lung opacity concerning for possible atelectasis or infiltrate. No significant change compared to prior exam. Aortic Atherosclerosis (ICD10-I70.0). Electronically Signed   By: Marijo Conception, M.D.   On: 08/16/2017 07:50     STUDIES:  3/23 Echo >>  CULTURES: 3/23 BC >> 3/23 UC>>  ANTIBIOTICS: 3/23 Vanc >>3/24 3/23 Zosyn >>  SIGNIFICANT EVENTS: 3/23 ER admit with resp distress  3/23 DNR/DNI/NO BIPAP    LINES/TUBES:   DISCUSSION: 77 yo male former smoker with underlying IPF , CHF , previous lung cancer s/p SBRT with recent right ankle fracture presented to ER 3/23 with hypotension, metabolic acidosis and acute on chronic respiratory failure . Treated with IVF and 2 u PRBC for guaic stools now with volume overload. He was treated with BIPAP for increased wob however pt wants DNR/DNI and no BIPAP .   ASSESSMENT / PLAN:  PULMONARY A: Acute on Chronic hypoxic Respiratory Failure - doubt PNA, pct low.  ?worsening of pulm fibrosis - has not tol OFEV or pirfenidone in past.  Some degree mild volume overload. Weaning O2.  Pulmonary Fibrosis with home O2 at 4l/m at baseline  Hx of Lung Cancer s/p SBRT  P:   Wean O2 as able  Mobilize  F/u CXR  DNR/DNI  Continue zosyn for now - can likely narrow or d/c in next day or so - follow CXR Hold further diuresis for today - put out ~5L overnight   CARDIOVASCULAR A:  CHF (echo 06/2017 EF 25-30%)  Hypotension  S/p Pacemaker  NSTEMI P:  Echo pending  Cardiology following  Holding home ASA, B blocker  Holding anticoagulation given concerns for possible GI bleeding  Pacemaker interrogation pending   RENAL A:   Metabolic Acidosis  Acute on chronic  Renal Failure  P:   F/u chem    GASTROINTESTINAL A:   Heme positive stools ? Etiology  P:   PPI  Trend Hgb   HEMATOLOGIC A:   Heme positive Stools - hgb stable  P:  S/p 2 U PRBC in ER 3/23  Trend cbc    INFECTIOUS A:   Leukocytosis /elevated Lactate -SIRS/Sepsis  Possible PNA  P:   Continue zosyn - vanc off  Pan Culture  Trend lactate  Check PCT   ENDOCRINE A:   No acute issues  P:    monitor glucose on chem   NEUROLOGIC A:   Ankle Pain from fx  P:   Pain management , avoid over sedation .  PT    FAMILY  - Updates: Wife updated at bedside at length 3/25.   - Inter-disciplinary family meet or Palliative Care meeting due by: 3/30  Can tx to SDU.    Nickolas Madrid, NP 08/16/2017  10:21 AM Pager: [phone] or [phone]

## 2017-08-16 NOTE — Progress Notes (Addendum)
Progress Note  Patient Name: Wayne Torres Date of Encounter: 08/16/2017  Primary Cardiologist: Dorris Carnes, MD   Subjective   77 year old gentleman with a history of coronary artery disease-status post coronary artery bypass grafting.  He has a history of idiopathic pulmonary fibrosis, hypertension, hyperlipidemia.  He was brought into the emergency room with generalized weakness and had fallen and broken his right ankle.  He was found to have sepsis.  No chest pain, no SOB, eating a small amount of lunch.    Inpatient Medications    Scheduled Meds: . mouth rinse  15 mL Mouth Rinse BID   Continuous Infusions: . sodium chloride    . sodium chloride    . pantoprozole (PROTONIX) infusion 8 mg/hr (08/16/17 1200)  . piperacillin-tazobactam (ZOSYN)  IV Stopped (08/16/17 1141)   PRN Meds: sodium chloride, acetaminophen, morphine injection, ondansetron (ZOFRAN) IV   Vital Signs    Vitals:   08/16/17 0900 08/16/17 1000 08/16/17 1100 08/16/17 1108  BP: 101/86 117/69 109/68   Pulse: 85 75 (!) 46   Resp: _0 Temp:    98.1 F (36.7 C)  TempSrc:    Oral  SpO2: 92% 94% 91%   Weight:      Height:        Intake/Output Summary (Last 24 hours) at 08/16/2017 1312 Last data filed at 08/16/2017 1200 Gross per 24 hour  Intake 712.5 ml  Output 4995 ml  Net -4282.5 ml   Filed Weights   08/14/17 2157 08/15/17 0500 08/16/17 0407  Weight: 210 lb (95.3 kg) 225 lb 1.4 oz (102.1 kg) 219 lb 5.7 oz (99.5 kg)    Telemetry    V pacing  - Personally Reviewed  ECG    No new - Personally Reviewed  Physical Exam   GEN: No acute distress.   Neck: No JVD Cardiac: RRR, no murmurs, rubs, or gallops.  Respiratory: Clear to auscultation bilaterally to few rales. GI: Soft, nontender, non-distended  MS: No edema; No deformity. Neuro:  Nonfocal  Psych: Normal affect   Labs    Chemistry Recent Labs  Lab 08/14/17 2226 08/15/17 0621 08/16/17 0525  NA 135 138 138  K 3.8  4.4 3.6  CL 109 110 100*  CO2 16* 19* 25  GLUCOSE 119* 97 103*  BUN 31* 31* 18  CREATININE 2.08* 1.62* 1.45*  CALCIUM 7.3* 7.4* 8.3*  PROT 5.5*  --   --   ALBUMIN 2.7*  --   --   AST 29  --   --   ALT 11*  --   --   ALKPHOS 59  --   --   BILITOT 0.8  --   --   GFRNONAA 29* 40* 46*  GFRAA 34* 46* 53*  ANIONGAP _1 Hematology Recent Labs  Lab 08/14/17 2226 08/15/17 0621 08/16/17 0525  WBC 15.3* 8.8 9.2  RBC 4.02* 4.32 4.51  HGB 13.4 14.1 14.6  HCT 41.6 43.1 45.4  MCV 103.5* 99.8 100.7*  MCH 33.3 32.6 32.4  MCHC 32.2 32.7 32.2  RDW 13.6 15.1 14.6  PLT 194 159 173    Cardiac Enzymes Recent Labs  Lab 08/15/17 0241 08/15/17 0621 08/15/17 1258 08/16/17 0525  TROPONINI 1.16* 3.61* 4.57* 3.50*   No results for input(s): TROPIPOC in the last 168 hours.   BNPNo results for input(s): BNP, PROBNP in the last 168 hours.   DDimer No results for input(s): DDIMER in the last  168 hours.   Radiology    Dg Ankle Complete Right  Result Date: 08/15/2017 CLINICAL DATA:  Ankle fracture. EXAM: RIGHT ANKLE - COMPLETE 3+ VIEW COMPARISON:  None. FINDINGS: Three views are submitted within a plaster cast. Oblique fracture of the distal fibula is displaced 4 mm. A posterior tibial fracture is minimally displaced. The fracture at the medial malleolus is likely present. There is some widening of the medial tibiotalar distance. IMPRESSION: Impressed 1. Oblique fracture of the fibula suggesting an eversion injury. 2. Minimally displaced posterior tibial fracture. 3. Probable fracture at the medial malleolus. 4. Widening of the medial tibiotalar space. Electronically Signed   By: San Morelle M.D.   On: 08/15/2017 14:03   Ct Chest Wo Contrast  Result Date: 08/15/2017 CLINICAL DATA:  Shortness of breath. Status post SP RT for non-small cell lung cancer. Idiopathic pulmonary fibrosis. EXAM: CT CHEST WITHOUT CONTRAST TECHNIQUE: Multidetector CT imaging of the chest was performed  following the standard protocol without IV contrast. COMPARISON:  One-view chest x-ray 08/14/2017. CT of the chest 04/05/2017. FINDINGS: Cardiovascular: Pacing wires are in place. The patient is status post median sternotomy for CABG. Atherosclerotic calcifications are present in the aorta without aneurysm. Pulmonary arteries are within normal limits. Mediastinum/Nodes: No significant mediastinal, axillary, or hilar adenopathy is present. The thoracic inlet is within normal limits. The esophagus is unremarkable. Lungs/Pleura: Post treatment changes of the left upper lobe are again seen. There is increase consolidation since the prior exam. Centrilobular emphysematous changes are present. Extensive peripheral fibrosis is noted. The 5 mm right middle lobe nodule is stable. Increased airspace opacities are present at the medial lower lobes bilaterally. This likely reflects atelectasis or infection. Upper Abdomen: Atherosclerotic calcifications are present in the aorta and branch vessels without aneurysm. Limited imaging of the solid organs is within normal limits. Musculoskeletal: Exaggerated thoracic kyphosis is stable. No focal lytic or blastic lesions are present. IMPRESSION: 1. Post treatment changes of the left upper lobe with increased consolidation. 2. Stable 5 mm nodule in the right middle lobe. 3. Increased airspace opacities in the medial lower lobes bilaterally. This is likely related to atelectasis or infection. Metastatic disease is considered unlikely. Recommend short-term follow-up CT chest without contrast at 2-3 months for further evaluation. 4.  Aortic Atherosclerosis (ICD10-I70.0). 5.  Emphysema (ICD10-J43.9). Electronically Signed   By: San Morelle M.D.   On: 08/15/2017 14:01   US Renal  Result Date: 08/15/2017 CLINICAL DATA:  Acute kidney injury EXAM: RENAL / URINARY TRACT ULTRASOUND COMPLETE COMPARISON:  None. FINDINGS: Right Kidney: Length: 11.8 cm. Echogenicity within normal limits.  There is mild cortical thinning. No mass or hydronephrosis visualized. Lower pole cyst measures 1.8 x 1.1 x 1.3 cm. Left Kidney: Length: 12.6 cm. Echogenicity within normal limits. No mass or hydronephrosis visualized. Cyst at the lower pole measures 4.2 x 3.0 x 3.9 cm. Bladder: Appears normal for degree of bladder distention. Other: Small volume right upper quadrant ascites. IMPRESSION: 1. No hydronephrosis or other acute abnormality. 2. Small volume right upper quadrant ascites. Electronically Signed   By: Ulyses Jarred M.D.   On: 08/15/2017 05:59   Dg Chest Port 1 View  Result Date: 08/16/2017 CLINICAL DATA:  Respiratory failure. EXAM: PORTABLE CHEST 1 VIEW COMPARISON:  Radiograph of August 14, 2017. FINDINGS: Stable cardiomegaly. Status post coronary artery bypass graft. Atherosclerosis of thoracic aorta is noted. Left-sided pacemaker is unchanged in position. Stable interstitial densities are noted bilaterally. Stable left basilar airspace opacity is noted. Bony thorax is  unremarkable. IMPRESSION: Stable bilateral interstitial densities are noted which may represent chronic disease, with stable left lung opacity concerning for possible atelectasis or infiltrate. No significant change compared to prior exam. Aortic Atherosclerosis (ICD10-I70.0). Electronically Signed   By: Marijo Conception, M.D.   On: 08/16/2017 07:50   Dg Chest Portable 1 View  Result Date: 08/14/2017 CLINICAL DATA:  Shortness of breath EXAM: PORTABLE CHEST 1 VIEW COMPARISON:  04/05/2017. FINDINGS: Low lung volumes. The cardio pericardial silhouette is enlarged. There is pulmonary vascular congestion without overt pulmonary edema. Underlying chronic interstitial changes again noted with bibasilar atelectasis/infiltrate. Left-sided permanent pacemaker again noted. Telemetry leads and defibrillator pads overlie the chest. Bones are demineralized. IMPRESSION: Cardiomegaly with low lung volumes and bibasilar atelectasis/infiltrate. Vascular  congestion.  Component of interstitial edema not excluded. Underlying chronic interstitial lung disease. Electronically Signed   By: Misty Stanley M.D.   On: 08/14/2017 23:29    Cardiac Studies   06/25/17 Echo Study Conclusions  - Left ventricle: The cavity size was normal. Wall thickness was   increased in a pattern of moderate LVH. Systolic function was   severely reduced. The estimated ejection fraction was in the   range of 25% to 30%. Diffuse hypokinesis. Doppler parameters are   consistent with abnormal left ventricular relaxation (grade 1   diastolic dysfunction). - Ventricular septum: The contour showed diastolic flattening. - Aortic valve: Trileaflet; mildly thickened, mildly calcified   leaflets. - Mitral valve: Mild prolapse, involving the anterior leaflet.   There was trivial regurgitation. - Right ventricle: Pacer wire or catheter noted in right ventricle. - Right atrium: Pacer wire or catheter noted in right atrium. - Tricuspid valve: There was trivial regurgitation.  Impressions:  - When compared to prior, mildly reduced EF.  Limited Echo today Pending   Patient Profile     77 y.o. male with a history of IPF, CAD with hx CABG and last cath 2014 with occluded VG to PL OM, other grafts were patent, hypertension, hyperlipidemia, and complete heart block status post pacemaker now admitted with hypotension, metabolic acidosis and acute on chronic respiratory failure.  And troponin pk of 4.57.    Assessment & Plan    NSTEMI - EKG with V pacing.  Suspect type II in acute illness. Heparin not started due to GI bleed and no chest pain.    CAD with hx CABG in 1994 (LIMA->LAD, VG->RCA, VG->RI, VG->Diag).  Lexiscan Myoview from November 2018 revealed large region of inferior/inferolateral scar but no active ischemia and LVEF 28%.   -last cath 2014 with one occluded VG   ICM with EF 28% on last nuc 2018 and similar now.  Negative 1525 Limited echo today.    Pulmonary  HTN followed at [facility]   Hx of PPM [employer] followed by Dr. Caryl Comes evaluated yesterday mode switch <1%, AT/AF burden <1%  Battery longevity 7.2 yrs.  See shadow chart for complete interrogation.   HTN controlled and on low end 110/99 to 87/55 last pm meds are on hold -amlodipine 2.5 mg daily on hold, lisinopril 2.5 mg daily , toprol 12.5 mg BID,  Would add BB back first once BP allows.   Heme + stools has rec'd blood but Hgb is stable.  Has rec'd 2 uPRBCs.  Acute Resp. Failure, pt is partial DNR and does not want intubation or CPR, agreed to BiPAP --CCM in followed  Chronic resp failure with with pulmonary fibrosis on home 02.   Hx of Lung cancer   AKI  on admit now Cr improving   Usually runs 1.23 to 1.34 was 2.08 on admit now 1.45   HLD on zocor 40 mg daily.   For questions or updates, please contact [place] Please consult [email or web address] for contact info under Cardiology/STEMI.      Signed, Cecilie Kicks, NP  08/16/2017, 1:12 PM     Attending Note:   The patient was seen and examined.  Agree with assessment and plan as noted above.  Changes made to the above note as needed.  Patient seen and independently examined with Cecilie Kicks, NP .   We discussed all aspects of the encounter. I agree with the assessment and plan as stated above.  1.  Elevated troponin levels: The patient had a peak troponin level of 4.57.  This was in the setting of bacteremia and hypertension. Not had any episodes of chest pain but it should be noted that he had bypass surgery for severe coronary artery disease without having any episodes of chest pain.  Echocardiogram has been ordered.  If his ejection fraction remains unchanged then we might consider continued medical therapy.  If the ejection fraction is significantly lower than we might need to consider heart catheterization.  2.  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  There is  grade 1 diastolic dysfunction. Repeat echocardiogram is pending.    I have spent a total of 40 minutes with patient reviewing hospital  notes , telemetry, EKGs, labs and examining patient as well as establishing an assessment and plan that was discussed with the patient. > 50% of time was spent in direct patient care.    Thayer Headings, Brooke Bonito., MD, [facility] 08/16/2017, 2:49 PM [address],  [place] Pager [phone]

## 2017-08-16 NOTE — Progress Notes (Signed)
Preliminary note by tech--Bilateral lower extremities venous duplex study completed. Negative for deep and superficial veins thrombosis where vessels approachable. Limited study on right calf veins due to bandage covered.    Wayne Torres(RDMS RVT) 08/16/17 1:59 PM

## 2017-08-17 ENCOUNTER — Telehealth: Payer: Self-pay | Admitting: Internal Medicine

## 2017-08-17 ENCOUNTER — Other Ambulatory Visit: Payer: Self-pay

## 2017-08-17 ENCOUNTER — Inpatient Hospital Stay (HOSPITAL_COMMUNITY): Payer: Medicare Other

## 2017-08-17 DIAGNOSIS — S82899A Other fracture of unspecified lower leg, initial encounter for closed fracture: Secondary | ICD-10-CM

## 2017-08-17 DIAGNOSIS — I5043 Acute on chronic combined systolic (congestive) and diastolic (congestive) heart failure: Secondary | ICD-10-CM

## 2017-08-17 LAB — CBC
HEMATOCRIT: 45.3 % (ref 39.0–52.0)
Hemoglobin: 15.3 g/dL (ref 13.0–17.0)
MCH: 32.9 pg (ref 26.0–34.0)
MCHC: 33.8 g/dL (ref 30.0–36.0)
MCV: 97.4 fL (ref 78.0–100.0)
Platelets: 170 10*3/uL (ref 150–400)
RBC: 4.65 MIL/uL (ref 4.22–5.81)
RDW: 13.7 % (ref 11.5–15.5)
WBC: 8.5 10*3/uL (ref 4.0–10.5)

## 2017-08-17 LAB — BASIC METABOLIC PANEL
ANION GAP: 12 (ref 5–15)
BUN: 12 mg/dL (ref 6–20)
CO2: 27 mmol/L (ref 22–32)
Calcium: 8.5 mg/dL — ABNORMAL LOW (ref 8.9–10.3)
Chloride: 98 mmol/L — ABNORMAL LOW (ref 101–111)
Creatinine, Ser: 1.21 mg/dL (ref 0.61–1.24)
GFR, EST NON AFRICAN AMERICAN: 57 mL/min — AB (ref >60)
Glucose, Bld: 107 mg/dL — ABNORMAL HIGH (ref 65–99)
POTASSIUM: 3.5 mmol/L (ref 3.5–5.1)
Sodium: 137 mmol/L (ref 135–145)

## 2017-08-17 LAB — PROCALCITONIN: PROCALCITONIN: 0.13 ng/mL

## 2017-08-17 MED ORDER — OXYCODONE-ACETAMINOPHEN 7.5-325 MG PO TABS: 1.0000 | ORAL_TABLET | Freq: Four times a day (QID) | ORAL | Status: DC | PRN

## 2017-08-17 MED ORDER — OXYCODONE-ACETAMINOPHEN 5-325 MG PO TABS
1.0000 | ORAL_TABLET | Freq: Four times a day (QID) | ORAL | Status: DC | PRN
  Administered 2017-08-17 – 2017-08-19 (×6): 1 via ORAL
  Administered 2017-08-20: 2 via ORAL
  Administered 2017-08-21 – 2017-08-26 (×12): 1 via ORAL
  Administered 2017-08-27 – 2017-08-28 (×3): 2 via ORAL
  Filled 2017-08-17 (×5): qty 1
  Filled 2017-08-17 (×2): qty 2
  Filled 2017-08-17 (×10): qty 1
  Filled 2017-08-17: qty 2
  Filled 2017-08-17: qty 1
  Filled 2017-08-17: qty 2
  Filled 2017-08-17 (×2): qty 1

## 2017-08-17 MED ORDER — FUROSEMIDE 10 MG/ML IJ SOLN
40.0000 mg | Freq: Once | INTRAMUSCULAR | Status: AC
  Administered 2017-08-17: 40 mg via INTRAVENOUS
  Filled 2017-08-17: qty 4

## 2017-08-17 MED ORDER — PANTOPRAZOLE SODIUM 40 MG PO TBEC
40.0000 mg | DELAYED_RELEASE_TABLET | Freq: Two times a day (BID) | ORAL | Status: DC
  Administered 2017-08-17 – 2017-08-28 (×23): 40 mg via ORAL
  Filled 2017-08-17 (×23): qty 1

## 2017-08-17 MED ORDER — OXYCODONE HCL 5 MG PO TABS
2.5000 mg | ORAL_TABLET | Freq: Four times a day (QID) | ORAL | Status: DC | PRN
  Administered 2017-08-17 – 2017-08-18 (×2): 5 mg via ORAL
  Administered 2017-08-18: 2.5 mg via ORAL
  Administered 2017-08-19 – 2017-08-28 (×6): 5 mg via ORAL
  Filled 2017-08-17 (×10): qty 1

## 2017-08-17 NOTE — Care Management Note (Addendum)
Case Management Note  Patient Details  Name: Wayne Torres MRN: [id number] Date of Birth: 03-22-1941  Subjective/Objective:                 Spoke with patient and spouse at the bedside. They are interested in going home at DC.  They state that heave DME: Oxygen through [employer], 3/1, canes, and WC waiting for them to pick up at [facility] in [facility]. Bathroom has walk in shower with seat.  Patient does not have nebulizer or CPAP.  They have a daughter who is a Therapist, sports and would like to talk to her about what [facility] agency to use. Wife states she will let us know in next few days.  Currently patient continues HFNC with past medical history including IPF, emphysema, pulmonary hypertension, systolic heart failure. He was admitted with acute on chronic respiratory failure in the setting of an NSTEMI and possible GI bleed.   16:00 Spoke w daughter Wayne Torres [phone] at request of patient's wife. Wayne Torres would like to use [facility] for [facility] services at DC. She will speak with her mom and coordinate what other DME they may need.      Action/Plan:  Cm will continue to follow for DC planning.  Expected Discharge Date:                  Expected Discharge Plan:  [place]  In-House Referral:     Discharge planning Services  CM Consult  Post Acute Care Choice:    Choice offered to:     DME Arranged:    DME Agency:     HH Arranged:    [place] Agency:     Status of Service:  In process, will continue to follow  If discussed at Long Length of Stay Meetings, dates discussed:    Additional Comments:  Carles Collet, RN 08/17/2017, 3:02 PM

## 2017-08-17 NOTE — Plan of Care (Signed)
FiO2 increased to 65% today. 53m IV Lasix given X 1. IV Morphine appropriately changed to PO Percocet. APAP and Perc both given once today. PT assessed patient today as well. No acute changes this shift.

## 2017-08-17 NOTE — Progress Notes (Signed)
PULMONARY / CRITICAL CARE MEDICINE   Name: Wayne Torres MRN: [id number] DOB: Apr 09, 1941    ADMISSION DATE:  08/14/2017 CONSULTATION DATE:  08/14/2017  REFERRING MD:  EDP  CHIEF COMPLAINT:  Feels a little better  HISTORY OF PRESENT ILLNESS:   77 y/o male with a history of IPF, systolic heart failure and CAD was admitted with acute on chronic respiratory fialure with hypoxemia after a fall and ankle fracture.  There was concern for a GI bleed on admission but he has not had bleeding since admission.   PAST MEDICAL HISTORY :  He  has a past medical history of Allergic rhinitis, Anxiety, Back pain, Bipolar disorder ([place]), Chronic bronchitis ([place]) (02/07/2012), Complete heart block ([place]), Coronary artery disease s/p CABG 1990s, Depression, ED (erectile dysfunction), Fatigue, GERD (gastroesophageal reflux disease), GI bleed, Hip pain, right, History of kidney stones, HTN (hypertension), Hyperlipidemia, Lumbar disc disease, Malignant neoplasm of prostate ([place]), Osteoarthritis, Peptic stricture of esophagus, Pneumonia (1993), Pulmonary fibrosis, postinflammatory ([place]), PVD (peripheral vascular disease) ([place]), and Shoulder joint pain.  PAST SURGICAL HISTORY: He  has a past surgical history that includes Coronary angioplasty (06/03/2007, 01/25/2009); Nasal septum surgery (april 2012); Coronary artery bypass graft (20 yrs ago); pacemaker placement (08/2011); permanent pacemaker insertion (N/A, 09/14/2011); left and right heart catheterization with coronary angiogram (N/A, 07/05/2012); Cystoscopy; and Video bronchoscopy with endobronchial ultrasound (Left, 09/05/2014).    SUBJECTIVE:  Feels a little better  VITAL SIGNS: BP 108/67   Pulse 84   Temp 98.2 F (36.8 C) (Oral)   Resp 20   Ht 6' (1.829 m)   Wt 218 lb 4.1 oz (99 kg)   SpO2 91%   BMI 29.60 kg/m   HEMODYNAMICS:    VENTILATOR SETTINGS: FiO2 (%):  [60 %-65 %] 65 %  INTAKE / OUTPUT: I/O last 3 completed shifts: In: 1122.5 [I.V.:1010;  IV Piggyback:112.5] Out: 8675 [Urine:8675]  PHYSICAL EXAMINATION:  General:  Resting comfortably in bed HENT: NCAT OP clear PULM: Crackles bases B, normal effort CV: RRR, no mgr GI: BS+, soft, nontender MSK: normal bulk and tone Neuro: sleepy, lethargic   LABS:  BMET Recent Labs  Lab 08/15/17 0621 08/16/17 0525 08/17/17 0439  NA 138 138 137  K 4.4 3.6 3.5  CL 110 100* 98*  CO2 19* 25 27  BUN 31* 18 12  CREATININE 1.62* 1.45* 1.21  GLUCOSE 97 103* 107*    Electrolytes Recent Labs  Lab 08/15/17 0621 08/16/17 0525 08/17/17 0439  CALCIUM 7.4* 8.3* 8.5*  MG 1.6* 1.4*  --   PHOS 3.0 2.0*  --     CBC Recent Labs  Lab 08/15/17 0621 08/16/17 0525 08/17/17 0439  WBC 8.8 9.2 8.5  HGB 14.1 14.6 15.3  HCT 43.1 45.4 45.3  PLT 159 173 170    Coag's Recent Labs  Lab 08/14/17 2305  INR 1.27    Sepsis Markers Recent Labs  Lab 08/14/17 2232 08/15/17 0241 08/15/17 0621 08/16/17 0525 08/17/17 0439  LATICACIDVEN 3.79* 3.0* 1.0  --   --   PROCALCITON  --  0.14  --  0.25 0.13    ABG Recent Labs  Lab 08/14/17 2234 08/15/17 0330  PHART 7.198* 7.335*  PCO2ART 43.8 41.5  PO2ART 62.0* 46.6*    Liver Enzymes Recent Labs  Lab 08/14/17 2226  AST 29  ALT 11*  ALKPHOS 59  BILITOT 0.8  ALBUMIN 2.7*    Cardiac Enzymes Recent Labs  Lab 08/15/17 0621 08/15/17 1258 08/16/17 0525  TROPONINI 3.61* 4.57* 3.50*  Glucose No results for input(s): GLUCAP in the last 168 hours.  Imaging Dg Chest Portable 1 View  Result Date: 08/17/2017 CLINICAL DATA:  Acute on chronic respiratory failure EXAM: PORTABLE CHEST 1 VIEW COMPARISON:  08/16/2017 FINDINGS: Left pacer remains in place, unchanged. Prior CABG. Cardiomegaly. Stable chronic interstitial densities within the lungs, left greater than right. No visible effusions. No acute bony abnormality. IMPRESSION: No significant change since prior study. Electronically Signed   By: Rolm Baptise M.D.   On:  08/17/2017 08:52     STUDIES:  CT chest : honeycombing and traction bronchiectasis noted, some consolidation in the left upper lobe, increased GGO in the bases  3/25 Left leg ultrasound> negative for DVT  CULTURES: 3.23 blood > neg  ANTIBIOTICS: vanc 3/23 x1 Zosyn 3/23 > 3/25  SIGNIFICANT EVENTS:   LINES/TUBES:   DISCUSSION: 77 y/o male with a complicated past medical history including IPF, emphysema, pulmonary hypertension, systolic heart failure was admitted with acute on chronic respiratory failure in the setting of an NSTEMI and possible GI bleed.  No bleeding noted since admission, he has had some improved sensation of dyspnea with lasix, but oxygenation remains poor. In general I favor a diagnosis of acute pulmonary edema as the primary problem on top of his known chronic lung disease.  ON 3/26 he is oversedated and has atelectasis.  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) P:  diruese today Cardiology consult appreciated Treating wth supportive management, no LHC planned for now  RENAL A:   No acute issues P:   Monitor BMET and UOP Replace electrolytes as needed   GASTROINTESTINAL A:   Dark stools, no clear bleeding P:   Change PPI from drip to oral Advance diet  HEMATOLOGIC A:   No acute issues P:  Monitor for bleedign  INFECTIOUS A:   No acute issues P:   Monitor for fever  ENDOCRINE A:   No acute issues P:   Monitor glucose  NEUROLOGIC A:   Pain  P:   Stop morphine Get out of bed Incentive spirometry  ORTHO: Ankle fracture, casted Plan: F/u  with orto   FAMILY  - Updates: updated wife 3/25 bedside  - Inter-disciplinary family meet or Palliative Care meeting due by:  Day 7  Maintain in ICU   Roselie Awkward, MD [place] [facility] Pager: [phone] Cell: [phone] After 3pm or if no response, call [phone]   08/17/2017, 12:32 PM

## 2017-08-17 NOTE — Telephone Encounter (Signed)
New Message   Pt's daughter is calling because the pt is in the icu and has been since Saturday, states he is not doing well and would like to speak to Dr. Harrington Challenger. Please call

## 2017-08-17 NOTE — Progress Notes (Addendum)
Progress Note  Patient Name: Wayne Torres Date of Encounter: 08/17/2017  Primary Cardiologist: Dorris Carnes, MD   Subjective   77 year old gentleman with a history of coronary artery disease-status post coronary artery bypass grafting.  He has a history of idiopathic pulmonary fibrosis, hypertension, hyperlipidemia.  He was brought into the emergency room with generalized weakness and had fallen and broken his right ankle.  He was found to have sepsis  Troponin peaked at 4.57.  Now trending down.   Echo yesterday showed moderate reduction of LV function with EF 35-40%.   Severe hypokinesis of the inferior and inferolateral myocardium  + moderate pulmonary hypertension   The EF is actually higher than his previous echo from Feb. 1, 2019 which showed an EF of 25-30%   Bilateral venous duplex scan showed no evidence of DVT   Inpatient Medications    Scheduled Meds: . mouth rinse  15 mL Mouth Rinse BID   Continuous Infusions: . sodium chloride    . sodium chloride    . pantoprozole (PROTONIX) infusion 8 mg/hr (08/17/17 0500)   PRN Meds: sodium chloride, acetaminophen, morphine injection, ondansetron (ZOFRAN) IV   Vital Signs    Vitals:   08/17/17 0400 08/17/17 0500 08/17/17 0809 08/17/17 0835  BP: (!) 95/58     Pulse: 88   84  Resp: (!) 22   20  Temp:   98.7 F (37.1 C)   TempSrc:   Oral   SpO2: (!) 89%   93%  Weight:  218 lb 4.1 oz (99 kg)    Height:        Intake/Output Summary (Last 24 hours) at 08/17/2017 0855 Last data filed at 08/17/2017 0500 Gross per 24 hour  Intake 625 ml  Output 4100 ml  Net -3475 ml   Filed Weights   08/15/17 0500 08/16/17 0407 08/17/17 0500  Weight: 225 lb 1.4 oz (102.1 kg) 219 lb 5.7 oz (99.5 kg) 218 lb 4.1 oz (99 kg)    Telemetry    NSR with A sensing and V pacing ,    - Personally Reviewed  ECG       Physical Exam   GEN:  mild respiratory distress  Neck: No JVD Cardiac: RRR,  Soft systolic murmur  Respiratory:  Clear to auscultation bilaterally. GI: Soft, nontender, non-distended  MS:  right foot in a cast  Neuro:  Nonfocal  Psych: Normal affect   Labs    Chemistry Recent Labs  Lab 08/14/17 2226 08/15/17 0621 08/16/17 0525 08/17/17 0439  NA 135 138 138 137  K 3.8 4.4 3.6 3.5  CL 109 110 100* 98*  CO2 16* 19* 25 27  GLUCOSE 119* 97 103* 107*  BUN 31* 31* 18 12  CREATININE 2.08* 1.62* 1.45* 1.21  CALCIUM 7.3* 7.4* 8.3* 8.5*  PROT 5.5*  --   --   --   ALBUMIN 2.7*  --   --   --   AST 29  --   --   --   ALT 11*  --   --   --   ALKPHOS 59  --   --   --   BILITOT 0.8  --   --   --   GFRNONAA 29* 40* 46* 57*  GFRAA 34* 46* 53* >60  ANIONGAP _0 Hematology Recent Labs  Lab 08/15/17 0621 08/16/17 0525 08/17/17 0439  WBC 8.8 9.2 8.5  RBC 4.32 4.51 4.65  HGB 14.1  14.6 15.3  HCT 43.1 45.4 45.3  MCV 99.8 100.7* 97.4  MCH 32.6 32.4 32.9  MCHC 32.7 32.2 33.8  RDW 15.1 14.6 13.7  PLT 159 173 170    Cardiac Enzymes Recent Labs  Lab 08/15/17 0241 08/15/17 0621 08/15/17 1258 08/16/17 0525  TROPONINI 1.16* 3.61* 4.57* 3.50*   No results for input(s): TROPIPOC in the last 168 hours.   BNPNo results for input(s): BNP, PROBNP in the last 168 hours.   DDimer No results for input(s): DDIMER in the last 168 hours.   Radiology    Dg Ankle Complete Right  Result Date: 08/15/2017 CLINICAL DATA:  Ankle fracture. EXAM: RIGHT ANKLE - COMPLETE 3+ VIEW COMPARISON:  None. FINDINGS: Three views are submitted within a plaster cast. Oblique fracture of the distal fibula is displaced 4 mm. A posterior tibial fracture is minimally displaced. The fracture at the medial malleolus is likely present. There is some widening of the medial tibiotalar distance. IMPRESSION: Impressed 1. Oblique fracture of the fibula suggesting an eversion injury. 2. Minimally displaced posterior tibial fracture. 3. Probable fracture at the medial malleolus. 4. Widening of the medial tibiotalar space.  Electronically Signed   By: San Morelle M.D.   On: 08/15/2017 14:03   Ct Chest Wo Contrast  Result Date: 08/15/2017 CLINICAL DATA:  Shortness of breath. Status post SP RT for non-small cell lung cancer. Idiopathic pulmonary fibrosis. EXAM: CT CHEST WITHOUT CONTRAST TECHNIQUE: Multidetector CT imaging of the chest was performed following the standard protocol without IV contrast. COMPARISON:  One-view chest x-ray 08/14/2017. CT of the chest 04/05/2017. FINDINGS: Cardiovascular: Pacing wires are in place. The patient is status post median sternotomy for CABG. Atherosclerotic calcifications are present in the aorta without aneurysm. Pulmonary arteries are within normal limits. Mediastinum/Nodes: No significant mediastinal, axillary, or hilar adenopathy is present. The thoracic inlet is within normal limits. The esophagus is unremarkable. Lungs/Pleura: Post treatment changes of the left upper lobe are again seen. There is increase consolidation since the prior exam. Centrilobular emphysematous changes are present. Extensive peripheral fibrosis is noted. The 5 mm right middle lobe nodule is stable. Increased airspace opacities are present at the medial lower lobes bilaterally. This likely reflects atelectasis or infection. Upper Abdomen: Atherosclerotic calcifications are present in the aorta and branch vessels without aneurysm. Limited imaging of the solid organs is within normal limits. Musculoskeletal: Exaggerated thoracic kyphosis is stable. No focal lytic or blastic lesions are present. IMPRESSION: 1. Post treatment changes of the left upper lobe with increased consolidation. 2. Stable 5 mm nodule in the right middle lobe. 3. Increased airspace opacities in the medial lower lobes bilaterally. This is likely related to atelectasis or infection. Metastatic disease is considered unlikely. Recommend short-term follow-up CT chest without contrast at 2-3 months for further evaluation. 4.  Aortic  Atherosclerosis (ICD10-I70.0). 5.  Emphysema (ICD10-J43.9). Electronically Signed   By: San Morelle M.D.   On: 08/15/2017 14:01   Dg Chest Portable 1 View  Result Date: 08/17/2017 CLINICAL DATA:  Acute on chronic respiratory failure EXAM: PORTABLE CHEST 1 VIEW COMPARISON:  08/16/2017 FINDINGS: Left pacer remains in place, unchanged. Prior CABG. Cardiomegaly. Stable chronic interstitial densities within the lungs, left greater than right. No visible effusions. No acute bony abnormality. IMPRESSION: No significant change since prior study. Electronically Signed   By: Rolm Baptise M.D.   On: 08/17/2017 08:52   Dg Chest Port 1 View  Result Date: 08/16/2017 CLINICAL DATA:  Respiratory failure. EXAM: PORTABLE CHEST 1 VIEW COMPARISON:  Radiograph of August 14, 2017. FINDINGS: Stable cardiomegaly. Status post coronary artery bypass graft. Atherosclerosis of thoracic aorta is noted. Left-sided pacemaker is unchanged in position. Stable interstitial densities are noted bilaterally. Stable left basilar airspace opacity is noted. Bony thorax is unremarkable. IMPRESSION: Stable bilateral interstitial densities are noted which may represent chronic disease, with stable left lung opacity concerning for possible atelectasis or infiltrate. No significant change compared to prior exam. Aortic Atherosclerosis (ICD10-I70.0). Electronically Signed   By: Marijo Conception, M.D.   On: 08/16/2017 07:50    Cardiac Studies      Patient Profile     77 y.o. male with hx of CAD . CABG, chronic systolic CHF with EF 58-25%.   Assessment & Plan    1.  Non-ST segment elevation myocardial infarction: The patient's ejection fraction is stable.  I suspect that the troponin elevation is due to demand ischemia.  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  Would continue conservative therapy without plans for heart  catheterization at this point.  2.  Acute on chronic systolic congestive heart failure: Ejection fraction is stable at 35-40% by echo. He is diuresed 5 L so far during this admission.  3.  Fracture of the right ankle: The patient appears to be fairly stable from a cardiac standpoint but would be at risk for general anesthesia.  If orthopedic surgery is considered, I would recommend a regional block if possible.  He also has significant pulmonary fibrosis and would be at fairly high risk of respiratory issues with general anesthesia.     For questions or updates, please contact [place] Please consult [email or web address] for contact info under Cardiology/STEMI.      Signed, Mertie Moores, MD  08/17/2017, 8:55 AM

## 2017-08-17 NOTE — Evaluation (Signed)
Physical Therapy Evaluation Patient Details Name: Wayne Torres MRN: [id number] DOB: 1940-06-18 Today's Date: 08/17/2017   History of Present Illness  77 y.o. male admitted for fall with associated right ankle fracture. He was found to be have septic shock and hypoxia. Pt has a history of multiple recent falls. PMH: IPF, systolic CHF, CAD s/p CABG, HTN, PVD, and HLD.  Clinical Impression  Pt presents with overall decrease in functional mobility secondary to above including impairments listed below (see PT Problem List). Pt most limited by cardiopulmonary status and RLE NWB status at this time. Pt min assist for bed mobility and min guard for lateral scoot transfer from bed to drop arm recliner chair. Pt on 10L HFNC throughout session. Per pt's wife, pt is prescribed home oxygen, but does not wear it frequently. Pt has significant history of multiple falls in the past few months. Pt to benefit from continued acute PT to maximize safety, functional mobility, and independence prior to d/c home with HHPT. Pt may require SNF level care if family unable to provide 24 hr supervision at home.      Follow Up Recommendations Home health PT;Supervision/Assistance - 24 hour    Equipment Recommendations  Wheelchair (measurements PT)    Recommendations for Other Services       Precautions / Restrictions Precautions Precautions: Fall Restrictions Weight Bearing Restrictions: Yes RLE Weight Bearing: Non weight bearing      Mobility  Bed Mobility Overal bed mobility: Needs Assistance Bed Mobility: Supine to Sit     Supine to sit: Min assist     General bed mobility comments: min assist to rotate hips and scoot to EOB, pt able to elevate trunk with HOB elevated  Transfers Overall transfer level: Needs assistance Equipment used: None Transfers: Lateral/Scoot Transfers          Lateral/Scoot Transfers: Min guard General transfer comment: min guard for scooting from EOB into drop arm  recliner. VCs for technique and to maintain NWB status  Ambulation/Gait             General Gait Details: not performed this session  Stairs            Wheelchair Mobility    Modified Rankin (Stroke Patients Only)       Balance Overall balance assessment: Needs assistance Sitting-balance support: Feet supported Sitting balance-Leahy Scale: Fair                                       Pertinent Vitals/Pain Pain Assessment: No/denies pain    Home Living Family/patient expects to be discharged to:: Private residence Living Arrangements: Spouse/significant other Available Help at Discharge: Family Type of Home: House Home Access: Stairs to enter Entrance Stairs-Rails: None Entrance Stairs-Number of Steps: 2 Home Layout: One level Home Equipment: Environmental consultant - 2 wheels;Cane - single point;Bedside commode;Shower seat;Tub bench      Prior Function Level of Independence: Independent         Comments: on home oxygen (4L?). per wife, doesn't always wear it     Hand Dominance        Extremity/Trunk Assessment   Upper Extremity Assessment Upper Extremity Assessment: Defer to OT evaluation    Lower Extremity Assessment Lower Extremity Assessment: Generalized weakness;RLE deficits/detail RLE Deficits / Details: R ankle fx       Communication   Communication: HOH  Cognition Arousal/Alertness: Awake/alert Behavior During Therapy: [facility] for  tasks assessed/performed Overall Cognitive Status: Within Functional Limits for tasks assessed                                        General Comments General comments (skin integrity, edema, etc.): VSS throughout. Pt on 10L HFNC throughout session.    Exercises     Assessment/Plan    PT Assessment Patient needs continued PT services  PT Problem List Decreased strength;Decreased balance;Decreased activity tolerance;Decreased mobility;Decreased knowledge of use of DME;Decreased knowledge  of precautions;Cardiopulmonary status limiting activity       PT Treatment Interventions DME instruction;Gait training;Stair training;Functional mobility training;Therapeutic activities;Therapeutic exercise;Balance training;Neuromuscular re-education;Patient/family education    PT Goals (Current goals can be found in the Care Plan section)  Acute Rehab PT Goals Patient Stated Goal: to go home PT Goal Formulation: With patient/family Time For Goal Achievement: 08/31/17 Potential to Achieve Goals: Good    Frequency Min 3X/week   Barriers to discharge Inaccessible home environment 2-3 steps to enter home    Co-evaluation               AM-PAC PT "6 Clicks" Daily Activity  Outcome Measure Difficulty turning over in bed (including adjusting bedclothes, sheets and blankets)?: A Little Difficulty moving from lying on back to sitting on the side of the bed? : A Little Difficulty sitting down on and standing up from a chair with arms (e.g., wheelchair, bedside commode, etc,.)?: A Little Help needed moving to and from a bed to chair (including a wheelchair)?: A Little Help needed walking in hospital room?: A Lot Help needed climbing 3-5 steps with a railing? : Total 6 Click Score: 15    End of Session Equipment Utilized During Treatment: Gait belt;Oxygen Activity Tolerance: Patient tolerated treatment well Patient left: in chair;with call bell/phone within reach;with chair alarm set;with family/visitor present Nurse Communication: Mobility status;Precautions PT Visit Diagnosis: Other abnormalities of gait and mobility (R26.89);Muscle weakness (generalized) (M62.81);History of falling (Z91.81)    Time: 0964-3838 PT Time Calculation (min) (ACUTE ONLY): 22 min   Charges:   PT Evaluation $PT Eval Moderate Complexity: 1 Mod     PT G Codes:        Vic Ripper, SPT  Vic Ripper 08/17/2017, 4:23 PM

## 2017-08-18 ENCOUNTER — Inpatient Hospital Stay (HOSPITAL_COMMUNITY): Payer: Medicare Other

## 2017-08-18 DIAGNOSIS — E876 Hypokalemia: Secondary | ICD-10-CM

## 2017-08-18 LAB — BASIC METABOLIC PANEL
Anion gap: 11 (ref 5–15)
BUN: 11 mg/dL (ref 6–20)
CHLORIDE: 97 mmol/L — AB (ref 101–111)
CO2: 30 mmol/L (ref 22–32)
Calcium: 8.8 mg/dL — ABNORMAL LOW (ref 8.9–10.3)
Creatinine, Ser: 0.97 mg/dL (ref 0.61–1.24)
Glucose, Bld: 111 mg/dL — ABNORMAL HIGH (ref 65–99)
POTASSIUM: 3.3 mmol/L — AB (ref 3.5–5.1)
SODIUM: 138 mmol/L (ref 135–145)

## 2017-08-18 LAB — GLUCOSE, CAPILLARY
Glucose-Capillary: 105 mg/dL — ABNORMAL HIGH (ref 65–99)
Glucose-Capillary: 192 mg/dL — ABNORMAL HIGH (ref 65–99)

## 2017-08-18 MED ORDER — POTASSIUM CHLORIDE CRYS ER 20 MEQ PO TBCR
40.0000 meq | EXTENDED_RELEASE_TABLET | Freq: Once | ORAL | Status: AC
  Administered 2017-08-18: 40 meq via ORAL
  Filled 2017-08-18: qty 2

## 2017-08-18 MED ORDER — POTASSIUM CHLORIDE CRYS ER 20 MEQ PO TBCR
20.0000 meq | EXTENDED_RELEASE_TABLET | Freq: Two times a day (BID) | ORAL | Status: DC
  Administered 2017-08-18 – 2017-08-23 (×10): 20 meq via ORAL
  Filled 2017-08-18 (×10): qty 1

## 2017-08-18 MED ORDER — METHYLPREDNISOLONE SODIUM SUCC 125 MG IJ SOLR
125.0000 mg | Freq: Four times a day (QID) | INTRAMUSCULAR | Status: DC
  Administered 2017-08-18 – 2017-08-19 (×5): 125 mg via INTRAVENOUS
  Filled 2017-08-18 (×5): qty 2

## 2017-08-18 MED ORDER — INSULIN ASPART 100 UNIT/ML ~~LOC~~ SOLN
2.0000 [IU] | SUBCUTANEOUS | Status: DC
  Administered 2017-08-18: 4 [IU] via SUBCUTANEOUS
  Administered 2017-08-19: 2 [IU] via SUBCUTANEOUS
  Administered 2017-08-19: 6 [IU] via SUBCUTANEOUS
  Administered 2017-08-19: 4 [IU] via SUBCUTANEOUS

## 2017-08-18 MED ORDER — FUROSEMIDE 10 MG/ML IJ SOLN
40.0000 mg | Freq: Once | INTRAMUSCULAR | Status: AC
  Administered 2017-08-18: 40 mg via INTRAVENOUS
  Filled 2017-08-18: qty 4

## 2017-08-18 NOTE — Progress Notes (Signed)
RT note- New sp02 order threshold now >88%. Flow decreased to 25L, continue to wean as tolerated.

## 2017-08-18 NOTE — Progress Notes (Signed)
Met patient and he shared he had already been visited by his pastor already. Wayne Torres, Chaplain   08/18/17 1600  Clinical Encounter Type  Visited With Patient  Visit Type Initial  Referral From Chaplain  Consult/Referral To Chaplain

## 2017-08-18 NOTE — Progress Notes (Signed)
Progress Note  Patient Name: Wayne Torres Date of Encounter: 08/18/2017  Primary Cardiologist: Dorris Carnes, MD   Subjective   77 year old gentleman with a history of coronary artery disease-status post coronary artery bypass grafting. He has a history of idiopathic pulmonary fibrosis, hypertension, hyperlipidemia.  He was brought into the emergency room with generalized weakness and had fallen and broken his right ankle. He was found to have sepsis  He has chronic combined systolic and diastolic congestive heart failure.  His baseline ejection fraction is around 35-40%.  Cardia [facility] Heal yesterday showed that his ejection fraction is remains the same ( following the mild NSTEMI - Troponin of 4.57)   He is continued to have issues with hypoxemia.  This is likely due to a combination of his pulmonary fibrosis and possibly some congestive heart failure/pulmonary edema.  Inpatient Medications    Scheduled Meds: . mouth rinse  15 mL Mouth Rinse BID  . pantoprazole  40 mg Oral BID   Continuous Infusions: . sodium chloride    . sodium chloride     PRN Meds: sodium chloride, ondansetron (ZOFRAN) IV, oxyCODONE-acetaminophen **AND** oxyCODONE   Vital Signs    Vitals:   08/18/17 0700 08/18/17 0747 08/18/17 0800 08/18/17 0900  BP: 106/63  105/68 (!) 119/107  Pulse: 87  88 83  Resp: 18  (!) 23 (!) 21  Temp:  98.9 F (37.2 C)    TempSrc:  Oral    SpO2: 93%  94% 91%  Weight:      Height:        Intake/Output Summary (Last 24 hours) at 08/18/2017 1006 Last data filed at 08/18/2017 0747 Gross per 24 hour  Intake 215 ml  Output 2770 ml  Net -2555 ml   Filed Weights   08/16/17 0407 08/17/17 0500 08/18/17 0600  Weight: 219 lb 5.7 oz (99.5 kg) 218 lb 4.1 oz (99 kg) 215 lb 13.3 oz (97.9 kg)    Telemetry     NSR  - Personally Reviewed  ECG     NSR  - Personally Reviewed  Physical Exam   GEN: No acute distress.   Neck: No JVD Cardiac: RRR, no murmurs, rubs, or  gallops.  Respiratory: rales bilaterally  GI: Soft, nontender, non-distended  MS: No edema; No deformity. Neuro:  Nonfocal  Psych: Normal affect   Labs    Chemistry Recent Labs  Lab 08/14/17 2226  08/16/17 0525 08/17/17 0439 08/18/17 0453  NA 135   < > 138 137 138  K 3.8   < > 3.6 3.5 3.3*  CL 109   < > 100* 98* 97*  CO2 16*   < > _0 GLUCOSE 119*   < > 103* 107* 111*  BUN 31*   < > _1 CREATININE 2.08*   < > 1.45* 1.21 0.97  CALCIUM 7.3*   < > 8.3* 8.5* 8.8*  PROT 5.5*  --   --   --   --   ALBUMIN 2.7*  --   --   --   --   AST 29  --   --   --   --   ALT 11*  --   --   --   --   ALKPHOS 59  --   --   --   --   BILITOT 0.8  --   --   --   --   GFRNONAA 29*   < > 46* 57* >60  GFRAA 34*   < > 53* >60 >60  ANIONGAP 10   < > _0 < > = values in this interval not displayed.     Hematology Recent Labs  Lab 08/15/17 0621 08/16/17 0525 08/17/17 0439  WBC 8.8 9.2 8.5  RBC 4.32 4.51 4.65  HGB 14.1 14.6 15.3  HCT 43.1 45.4 45.3  MCV 99.8 100.7* 97.4  MCH 32.6 32.4 32.9  MCHC 32.7 32.2 33.8  RDW 15.1 14.6 13.7  PLT 159 173 170    Cardiac Enzymes Recent Labs  Lab 08/15/17 0241 08/15/17 0621 08/15/17 1258 08/16/17 0525  TROPONINI 1.16* 3.61* 4.57* 3.50*   No results for input(s): TROPIPOC in the last 168 hours.   BNPNo results for input(s): BNP, PROBNP in the last 168 hours.   DDimer No results for input(s): DDIMER in the last 168 hours.   Radiology    Dg Chest Portable 1 View  Result Date: 08/17/2017 CLINICAL DATA:  Acute on chronic respiratory failure EXAM: PORTABLE CHEST 1 VIEW COMPARISON:  08/16/2017 FINDINGS: Left pacer remains in place, unchanged. Prior CABG. Cardiomegaly. Stable chronic interstitial densities within the lungs, left greater than right. No visible effusions. No acute bony abnormality. IMPRESSION: No significant change since prior study. Electronically Signed   By: Rolm Baptise M.D.   On: 08/17/2017 08:52    Cardiac  Studies     Patient Profile     77 y.o. male with a history of coronary artery disease, chronic combined systolic and diastolic congestive heart failure and significant pulmonary fibrosis.  He is admitted with generalized weakness and sepsis.  Assessment & Plan    1.  Chronic combined systolic and diastolic congestive heart failure:   his ejection fraction remained stable.  Continue current medications. Agree with giving him an additional dose of Lasix today to see if this helps with his oxygenation. He is net -7.1 L so far during this admission.  His creatinine has continued to improve with diuresis. Cont lasix 40 IV daily. Replace potassium   2.  Right ankle fracture: At some point he will likely need to go for surgical repair of his ankle.  I would prefer that we do a regional block which would be very low risk from a cardiac standpoint.   3. Hypokalemia:   Replace with Kdur .   For questions or updates, please contact [place] Please consult [email or web address] for contact info under Cardiology/STEMI.      Signed, Mertie Moores, MD  08/18/2017, 10:06 AM

## 2017-08-18 NOTE — Progress Notes (Signed)
PULMONARY / CRITICAL CARE MEDICINE   Name: Wayne Torres MRN: [id number] DOB: 13-Jan-1941    ADMISSION DATE:  08/14/2017 CONSULTATION DATE:  08/14/2017  REFERRING MD:  EDP  CHIEF COMPLAINT:  Feels a little better  HISTORY OF PRESENT ILLNESS:   77 y/o male with a history of IPF, systolic heart failure and CAD was admitted with acute on chronic respiratory fialure with hypoxemia after a fall and ankle fracture.  There was concern for a GI bleed on admission but he has not had bleeding since admission.   SUBJECTIVE:  No distress, feels better, unfortunately still on very high oxygen VITAL SIGNS: Blood Pressure (Abnormal) 119/107   Pulse 83   Temperature 98.9 F (37.2 C) (Oral)   Respiration (Abnormal) 21   Height 6' (1.829 m)   Weight 215 lb 13.3 oz (97.9 kg)   Oxygen Saturation 91%   Body Mass Index 29.27 kg/m   HEMODYNAMICS:    VENTILATOR SETTINGS: FiO2 (%):  [65 %] 65 %  INTAKE / OUTPUT:  Intake/Output Summary (Last 24 hours) at 08/18/2017 0954 Last data filed at 08/18/2017 0747 Gross per 24 hour  Intake 250 ml  Output 2770 ml  Net -2520 ml     PHYSICAL EXAMINATION: General: 77 year old white male comfortable lying in bed.  No acute distress however does desaturate with minimal movement HEENT: Normocephalic atraumatic mucous membranes are moist Pulmonary: Crackles bases, no accessory use at rest Cardiac: Regular rate and rhythm no murmur rub or gallop Abdomen: Soft nontender Extremities: Right lower extremity is casted.  CMS is intact.  Warm and dry. Neuro/psych: Awake oriented very hard of hearing no focal deficits   LABS:  BMET Recent Labs  Lab 08/16/17 0525 08/17/17 0439 08/18/17 0453  NA 138 137 138  K 3.6 3.5 3.3*  CL 100* 98* 97*  CO2 _0 BUN _1 CREATININE 1.45* 1.21 0.97  GLUCOSE 103* 107* 111*    Electrolytes Recent Labs  Lab 08/15/17 0621 08/16/17 0525 08/17/17 0439 08/18/17 0453  CALCIUM 7.4* 8.3* 8.5* 8.8*  MG  1.6* 1.4*  --   --   PHOS 3.0 2.0*  --   --     CBC Recent Labs  Lab 08/15/17 0621 08/16/17 0525 08/17/17 0439  WBC 8.8 9.2 8.5  HGB 14.1 14.6 15.3  HCT 43.1 45.4 45.3  PLT 159 173 170    Coag's Recent Labs  Lab 08/14/17 2305  INR 1.27    Sepsis Markers Recent Labs  Lab 08/14/17 2232 08/15/17 0241 08/15/17 0621 08/16/17 0525 08/17/17 0439  LATICACIDVEN 3.79* 3.0* 1.0  --   --   PROCALCITON  --  0.14  --  0.25 0.13    ABG Recent Labs  Lab 08/14/17 2234 08/15/17 0330  PHART 7.198* 7.335*  PCO2ART 43.8 41.5  PO2ART 62.0* 46.6*    Liver Enzymes Recent Labs  Lab 08/14/17 2226  AST 29  ALT 11*  ALKPHOS 59  BILITOT 0.8  ALBUMIN 2.7*    Cardiac Enzymes Recent Labs  Lab 08/15/17 0621 08/15/17 1258 08/16/17 0525  TROPONINI 3.61* 4.57* 3.50*    Glucose No results for input(s): GLUCAP in the last 168 hours.  Imaging No results found.   STUDIES:  CT chest : honeycombing and traction bronchiectasis noted, some consolidation in the left upper lobe, increased GGO in the bases  3/25 Left leg ultrasound> negative for DVT  CULTURES: 3.23 blood > neg  ANTIBIOTICS: vanc 3/23 x1 Zosyn 3/23 > 3/25  SIGNIFICANT EVENTS:   LINES/TUBES:   DISCUSSION: 77 y/o male with a complicated past medical history including IPF, emphysema, pulmonary hypertension, systolic heart failure was admitted with acute on chronic respiratory failure in the setting of an NSTEMI and possible GI bleed.  No bleeding noted since admission, he has had some improved sensation of dyspnea with lasix, but oxygenation remains poor. In general I favor a diagnosis of acute pulmonary edema as the primary problem on top of his known chronic lung disease.  ON 3/26 he is oversedated and has atelectasis.  ASSESSMENT / PLAN:  Acute respiratory failure with hypoxemia in setting of acute pulmonary edema and atelectasis superimposed on IPF -IPF flare seems doubtful -His lower extremity  Dopplers were negative for DVT Plan Continue to wean oxygen for pulse oximetry goal greater than 88% Repeat Lasix again today Repeat portable chest x-ray in a.m., if this is improved but he still continues to require high oxygen we may need to consider CT angiogram to rule out pulmonary emboli We will check ESR Depending on above may need to consider a steroid trial  NSTEMI in setting of known CAD Acute decompensated systolic heart failure (EF 35-40%) 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

## 2017-08-19 ENCOUNTER — Encounter (HOSPITAL_COMMUNITY): Payer: Self-pay | Admitting: Radiology

## 2017-08-19 ENCOUNTER — Inpatient Hospital Stay (HOSPITAL_COMMUNITY): Payer: Medicare Other

## 2017-08-19 LAB — GLUCOSE, CAPILLARY
GLUCOSE-CAPILLARY: 138 mg/dL — AB (ref 65–99)
GLUCOSE-CAPILLARY: 196 mg/dL — AB (ref 65–99)
GLUCOSE-CAPILLARY: 207 mg/dL — AB (ref 65–99)
Glucose-Capillary: 172 mg/dL — ABNORMAL HIGH (ref 65–99)
Glucose-Capillary: 188 mg/dL — ABNORMAL HIGH (ref 65–99)
Glucose-Capillary: 181 mg/dL — ABNORMAL HIGH (ref 65–99)

## 2017-08-19 LAB — BASIC METABOLIC PANEL
Anion gap: 12 (ref 5–15)
BUN: 19 mg/dL (ref 6–20)
CALCIUM: 9.2 mg/dL (ref 8.9–10.3)
CO2: 25 mmol/L (ref 22–32)
Chloride: 99 mmol/L — ABNORMAL LOW (ref 101–111)
Creatinine, Ser: 1.07 mg/dL (ref 0.61–1.24)
GFR calc Af Amer: >60 mL/min (ref >60)
GLUCOSE: 212 mg/dL — AB (ref 65–99)
Potassium: 3.9 mmol/L (ref 3.5–5.1)
Sodium: 136 mmol/L (ref 135–145)

## 2017-08-19 LAB — CBC
HCT: 46.9 % (ref 39.0–52.0)
Hemoglobin: 15.8 g/dL (ref 13.0–17.0)
MCH: 32.9 pg (ref 26.0–34.0)
MCHC: 33.7 g/dL (ref 30.0–36.0)
MCV: 97.7 fL (ref 78.0–100.0)
Platelets: 206 10*3/uL (ref 150–400)
RBC: 4.8 MIL/uL (ref 4.22–5.81)
RDW: 13 % (ref 11.5–15.5)
WBC: 9.1 10*3/uL (ref 4.0–10.5)

## 2017-08-19 LAB — SEDIMENTATION RATE: Sed Rate: 24 mm/hr — ABNORMAL HIGH (ref 0–16)

## 2017-08-19 MED ORDER — FUROSEMIDE 10 MG/ML IJ SOLN
40.0000 mg | Freq: Once | INTRAMUSCULAR | Status: AC
  Administered 2017-08-19: 40 mg via INTRAVENOUS
  Filled 2017-08-19: qty 4

## 2017-08-19 MED ORDER — INSULIN ASPART 100 UNIT/ML ~~LOC~~ SOLN
0.0000 [IU] | Freq: Three times a day (TID) | SUBCUTANEOUS | Status: DC
  Administered 2017-08-19 (×2): 3 [IU] via SUBCUTANEOUS
  Administered 2017-08-20: 5 [IU] via SUBCUTANEOUS
  Administered 2017-08-20 – 2017-08-21 (×3): 3 [IU] via SUBCUTANEOUS
  Administered 2017-08-21: 2 [IU] via SUBCUTANEOUS
  Administered 2017-08-21 – 2017-08-22 (×3): 3 [IU] via SUBCUTANEOUS
  Administered 2017-08-22: 5 [IU] via SUBCUTANEOUS
  Administered 2017-08-23: 3 [IU] via SUBCUTANEOUS
  Administered 2017-08-23: 2 [IU] via SUBCUTANEOUS
  Administered 2017-08-23 – 2017-08-25 (×4): 3 [IU] via SUBCUTANEOUS
  Administered 2017-08-26: 2 [IU] via SUBCUTANEOUS
  Administered 2017-08-26: 3 [IU] via SUBCUTANEOUS
  Administered 2017-08-27: 5 [IU] via SUBCUTANEOUS
  Administered 2017-08-28: 3 [IU] via SUBCUTANEOUS

## 2017-08-19 MED ORDER — METHYLPREDNISOLONE SODIUM SUCC 1000 MG IJ SOLR
250.0000 mg | Freq: Four times a day (QID) | INTRAMUSCULAR | Status: DC
  Administered 2017-08-19 – 2017-08-20 (×3): 250 mg via INTRAVENOUS
  Filled 2017-08-19 (×4): qty 2

## 2017-08-19 MED ORDER — IOPAMIDOL (ISOVUE-370) INJECTION 76%
INTRAVENOUS | Status: AC
  Administered 2017-08-19: 100 mL
  Filled 2017-08-19: qty 100

## 2017-08-19 NOTE — Progress Notes (Signed)
Progress Note  Patient Name: Wayne Torres Date of Encounter: 08/19/2017  Primary Cardiologist: Dorris Carnes, MD   Subjective   77 year old gentleman with a history of coronary artery disease-status post coronary artery bypass grafting. He has a history of idiopathic pulmonary fibrosis, hypertension, hyperlipidemia.  He was brought into the emergency room with generalized weakness and had fallen and broken his right ankle. He was found to have sepsis  He has chronic combined systolic and diastolic congestive heart failure.  His baseline ejection fraction is around 35-40%.  Cardia Phillip Heal yesterday showed that his ejection fraction is remains the same ( following the mild NSTEMI - Troponin of 4.57)   He continues to diurese .  Net -9.0 liters so far this admission Renal function is stable   Inpatient Medications    Scheduled Meds: . insulin aspart  0-15 Units Subcutaneous TID WC  . mouth rinse  15 mL Mouth Rinse BID  . methylPREDNISolone (SOLU-MEDROL) injection  125 mg Intravenous Q6H  . pantoprazole  40 mg Oral BID  . potassium chloride  20 mEq Oral BID   Continuous Infusions: . sodium chloride     PRN Meds: sodium chloride, ondansetron (ZOFRAN) IV, oxyCODONE-acetaminophen **AND** oxyCODONE   Vital Signs    Vitals:   08/19/17 1100 08/19/17 1134 08/19/17 1200 08/19/17 1300  BP: 136/78  132/87 134/82  Pulse: 80  85 85  Resp: (!) 22  20 (!) 35  Temp:  98.1 F (36.7 C)    TempSrc:  Oral    SpO2: 91%  91% 91%  Weight:      Height:        Intake/Output Summary (Last 24 hours) at 08/19/2017 1424 Last data filed at 08/19/2017 1358 Gross per 24 hour  Intake 240 ml  Output 1285 ml  Net -1045 ml   Filed Weights   08/17/17 0500 08/18/17 0600 08/19/17 0434  Weight: 218 lb 4.1 oz (99 kg) 215 lb 13.3 oz (97.9 kg) 208 lb 5.4 oz (94.5 kg)    Telemetry    NSR  - Personally Reviewed  ECG    NSR  - Personally Reviewed  Physical Exam   GEN: No acute distress.     Neck: No JVD Cardiac:  RR   Respiratory: rales bilaterally  GI: + BS  MS:  no edema  Neuro:  Nonfocal  Psych: Normal affect   Labs    Chemistry Recent Labs  Lab 08/14/17 2226  08/17/17 0439 08/18/17 0453 08/19/17 0834  NA 135   < > 137 138 136  K 3.8   < > 3.5 3.3* 3.9  CL 109   < > 98* 97* 99*  CO2 16*   < > _0 GLUCOSE 119*   < > 107* 111* 212*  BUN 31*   < > _1 CREATININE 2.08*   < > 1.21 0.97 1.07  CALCIUM 7.3*   < > 8.5* 8.8* 9.2  PROT 5.5*  --   --   --   --   ALBUMIN 2.7*  --   --   --   --   AST 29  --   --   --   --   ALT 11*  --   --   --   --   ALKPHOS 59  --   --   --   --   BILITOT 0.8  --   --   --   --   [facility]  29*   < > 57* >60 >60  GFRAA 34*   < > >60 >60 >60  ANIONGAP 10   < > _0 < > = values in this interval not displayed.     Hematology Recent Labs  Lab 08/16/17 0525 08/17/17 0439 08/19/17 0834  WBC 9.2 8.5 9.1  RBC 4.51 4.65 4.80  HGB 14.6 15.3 15.8  HCT 45.4 45.3 46.9  MCV 100.7* 97.4 97.7  MCH 32.4 32.9 32.9  MCHC 32.2 33.8 33.7  RDW 14.6 13.7 13.0  PLT 173 170 206    Cardiac Enzymes Recent Labs  Lab 08/15/17 0241 08/15/17 0621 08/15/17 1258 08/16/17 0525  TROPONINI 1.16* 3.61* 4.57* 3.50*   No results for input(s): TROPIPOC in the last 168 hours.   BNPNo results for input(s): BNP, PROBNP in the last 168 hours.   DDimer No results for input(s): DDIMER in the last 168 hours.   Radiology    Ct Chest High Resolution  Result Date: 08/19/2017 CLINICAL DATA:  Inpatient. Admitted with acute on chronic respiratory failure after fall with ankle fracture. 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 History of IPF. Former smoker. EXAM: CT CHEST WITHOUT CONTRAST TECHNIQUE: Multidetector CT imaging of the chest was performed following the standard protocol without intravenous contrast. High resolution imaging of the lungs, as well as inspiratory and expiratory  imaging, was performed. COMPARISON:  Chest radiograph from one day prior. 08/15/2017 chest CT. FINDINGS: Cardiovascular: Stable mild cardiomegaly. No significant pericardial fluid/thickening. Stable configuration of 2 lead left subclavian pacemaker with lead tips in the right atrium and right ventricular apex. Three-vessel coronary atherosclerosis status post CABG. Atherosclerotic nonaneurysmal thoracic aorta. Stable dilated main pulmonary artery (3.7 cm diameter). Mediastinum/Nodes: No discrete thyroid nodules. Unremarkable esophagus. No pathologically enlarged axillary, mediastinal or discrete hilar lymph nodes, noting limited sensitivity for the detection of hilar adenopathy on this noncontrast study. Lungs/Pleura: No pneumothorax. Trace dependent bilateral pleural effusions, decreased bilaterally. Moderate centrilobular and paraseptal emphysema. There is sharply marginated bandlike consolidation in the left upper lobe with associated volume loss and distortion, stable in thickness although increased in density since 04/05/2017 chest CT. The previously visualized consolidation in medial basilar lower lobes bilaterally on the 08/15/2017 chest CT has largely resolved, compatible with improved aeration and decreased atelectasis. No acute consolidative airspace disease or lung masses. Basilar right upper lobe 5 mm solid pulmonary nodule (series 6/image 84) appears slightly increased from 3 mm on 04/05/2017 chest CT. Separate basilar right upper lobe 5 mm solid pulmonary nodule (series 6/image 73) is stable since 04/05/2017 chest CT. No additional significant pulmonary nodules. No significant lobular air trapping on the expiration sequence. Redemonstrated is patchy confluent subpleural reticulation and ground-glass attenuation throughout both lungs with associated mild traction bronchiectasis and architectural distortion, with a dependent basilar gradient to these findings. Redemonstrated are unusual confluent  prominent cystic airspaces in the subpleural dependent lower lobes, favored to represent a combination of emphysema and honeycombing. These findings have not appreciably progressed since 04/05/2017 chest CT. Upper abdomen: Tiny hiatal hernia. Musculoskeletal: No aggressive appearing focal osseous lesions. Intact sternotomy wires. 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 Moderate thoracic spondylosis. IMPRESSION: 1. 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 2. Improved aeration at the lung bases, with resolved consolidation from the 08/15/2017 chest CT, compatible with resolved atelectasis. No acute consolidative  airspace disease to suggest a pneumonia. 3. Stable bandlike radiation fibrosis in the left upper lobe, mildly increased in density since 04/05/2017 chest CT, probably due to superimposed mild hypoventilation. No definite local tumor recurrence. Recommend attention on follow-up chest CT in 3 months. 4. Small 5 mm solid right upper lobe pulmonary nodule appears slightly increased in size since 04/05/2017 chest CT. Recommend attention on follow-up chest CT in 3 months. 5. Trace bilateral pleural effusions, decreased bilaterally. Cardiomegaly. 6. Stable dilated main pulmonary artery, compatible with chronic pulmonary arterial hypertension. Aortic Atherosclerosis (ICD10-I70.0) and Emphysema (ICD10-J43.9). Electronically Signed   By: Ilona Sorrel M.D.   On: 08/19/2017 10:15   Dg Chest Port 1 View  Result Date: 08/19/2017 CLINICAL DATA:  Idiopathic pulmonary fibrosis EXAM: PORTABLE CHEST 1 VIEW COMPARISON:  08/17/2017 FINDINGS: Prominent reticular lung markings bilaterally are stable and compatible with chronic fibrosis. No superimposed infiltrate or effusion. Negative for edema. Dual lead pacemaker unchanged in position. IMPRESSION:  Pulmonary fibrosis without interval change. No superimposed acute abnormality. Electronically Signed   By: Franchot Gallo M.D.   On: 08/19/2017 07:48    Cardiac Studies     Patient Profile     77 y.o. male with a history of coronary artery disease, chronic combined systolic and diastolic congestive heart failure and significant pulmonary fibrosis.  He is admitted with generalized weakness and sepsis.  Assessment & Plan    1.  Chronic combined systolic and diastolic congestive heart failure:   his ejection fraction remained stable.  Continue current medications. Continues to diurese well  Net -9.0 liters so far  Breathing is better    2.  Right ankle fracture: At some point he will likely need to go for surgical repair of his ankle.  I would prefer that we do a regional block which would be very low risk from a cardiac standpoint.   3. Hypokalemia:   Better     For questions or updates, please contact [place] Please consult [email or web address] for contact info under Cardiology/STEMI.      Signed, Mertie Moores, MD  08/19/2017, 2:24 PM

## 2017-08-19 NOTE — Progress Notes (Signed)
Physical Therapy Treatment Patient Details Name: Wayne Torres MRN: [id number] DOB: 02/08/1941 Today's Date: 08/19/2017    History of Present Illness Pt is a 77 y.o. male admitted for fall with associated right ankle fracture. He was found to be have septic shock and hypoxia. Pt has a history of multiple recent falls. PMH: IPF, systolic CHF, CAD s/p CABG, HTN, PVD, and HLD.    PT Comments    Pt making good progress with functional mobility. He remains limited secondary to poor pulmonary tolerance. Pt on 20-30L at 50% with RN present adjusting O2 as needed. Pt would continue to benefit from skilled physical therapy services at this time while admitted and after d/c to address the below listed limitations in order to improve overall safety and independence with functional mobility.   Follow Up Recommendations  Home health PT;Supervision/Assistance - 24 hour     Equipment Recommendations  Wheelchair (measurements PT);Wheelchair cushion (measurements PT);Other (comment)(w/c with elevating leg rests)    Recommendations for Other Services       Precautions / Restrictions Precautions Precautions: Fall Restrictions Weight Bearing Restrictions: Yes RLE Weight Bearing: Non weight bearing    Mobility  Bed Mobility Overal bed mobility: Needs Assistance Bed Mobility: Supine to Sit;Sit to Supine     Supine to sit: Supervision Sit to supine: Supervision   General bed mobility comments: supervision for safety  Transfers Overall transfer level: Needs assistance Equipment used: (back of straight back chair) Transfers: Sit to/from Stand Sit to Stand: Min guard         General transfer comment: min guard for safety; pt able to achieve standing position from EOB while maintaining NWB R LE independently  Ambulation/Gait             General Gait Details: not performed this session   Stairs            Wheelchair Mobility    Modified Rankin (Stroke Patients Only)       Balance Overall balance assessment: Needs assistance Sitting-balance support: Feet supported Sitting balance-Leahy Scale: Good     Standing balance support: During functional activity;Bilateral upper extremity supported Standing balance-Leahy Scale: Poor                              Cognition Arousal/Alertness: Awake/alert Behavior During Therapy: WFL for tasks assessed/performed Overall Cognitive Status: Within Functional Limits for tasks assessed                                        Exercises      General Comments        Pertinent Vitals/Pain Pain Assessment: No/denies pain    Home Living                      Prior Function            PT Goals (current goals can now be found in the care plan section) Acute Rehab PT Goals PT Goal Formulation: With patient/family Time For Goal Achievement: 08/31/17 Potential to Achieve Goals: Good Progress towards PT goals: Progressing toward goals    Frequency    Min 3X/week      PT Plan Current plan remains appropriate    Co-evaluation              AM-PAC PT "6 Clicks" Daily Activity  Outcome Measure  Difficulty turning over in bed (including adjusting bedclothes, sheets and blankets)?: None Difficulty moving from lying on back to sitting on the side of the bed? : None Difficulty sitting down on and standing up from a chair with arms (e.g., wheelchair, bedside commode, etc,.)?: Unable Help needed moving to and from a bed to chair (including a wheelchair)?: A Little Help needed walking in hospital room?: A Lot Help needed climbing 3-5 steps with a railing? : Total 6 Click Score: 15    End of Session Equipment Utilized During Treatment: Gait belt;Oxygen Activity Tolerance: Other (comment)(poor pulmonary tolerance) Patient left: in bed;with call bell/phone within reach;with SCD's reapplied Nurse Communication: Mobility status;Precautions PT Visit Diagnosis: Other  abnormalities of gait and mobility (R26.89);Muscle weakness (generalized) (M62.81);History of falling (Z91.81)     Time: 6067-7034 PT Time Calculation (min) (ACUTE ONLY): 16 min  Charges:  $Therapeutic Activity: 8-22 mins                    G Codes:       [place], [place], [place] 08/19/2017, 2:26 PM

## 2017-08-19 NOTE — Progress Notes (Signed)
PULMONARY / CRITICAL CARE MEDICINE   Name: Wayne Torres MRN: [id number] DOB: January 21, 1941    ADMISSION DATE:  08/14/2017 CONSULTATION DATE:  08/14/2017  REFERRING MD:  EDP  CHIEF COMPLAINT:  Feels a little better  HISTORY OF PRESENT ILLNESS:   77 y/o male with a history of IPF, systolic heart failure and CAD was admitted with acute on chronic respiratory fialure with hypoxemia after a fall and ankle fracture.  There was concern for a GI bleed on admission but he has not had bleeding since admission.   SUBJECTIVE:  Still on high-dose oxygen but feeling better initiated steroid trial yesterday on 3/27 VITAL SIGNS: Blood Pressure (Abnormal) 118/48   Pulse 86   Temperature 98.5 F (36.9 C) (Oral)   Respiration 16   Height 6' (1.829 m)   Weight 208 lb 5.4 oz (94.5 kg)   Oxygen Saturation (Abnormal) 88%   Body Mass Index 28.26 kg/m   HEMODYNAMICS:    VENTILATOR SETTINGS: FiO2 (%):  [50 %-55 %] 50 %  INTAKE / OUTPUT:  Intake/Output Summary (Last 24 hours) at 08/19/2017 0949 Last data filed at 08/19/2017 0600 Gross per 24 hour  Intake 240 ml  Output 1310 ml  Net -1070 ml     PHYSICAL EXAMINATION: General: This is a 77 year old white male is currently resting comfortably in bed he is in no acute distress HEENT: Normocephalic atraumatic mucous membranes are moist Pulmonary crackles bases, equal chest rise, no accessory use Cardiac: Regular rate and rhythm Abdomen: Soft nontender Extremities: Warm dry trace lower extremity edema Neuro: Hard of hearing, otherwise completely intact Muscular skeletal: Right leg cast, intact CMS.   LABS:  BMET Recent Labs  Lab 08/17/17 0439 08/18/17 0453 08/19/17 0834  NA 137 138 136  K 3.5 3.3* 3.9  CL 98* 97* 99*  CO2 _0 BUN _1 CREATININE 1.21 0.97 1.07  GLUCOSE 107* 111* 212*    Electrolytes Recent Labs  Lab 08/15/17 0621 08/16/17 0525 08/17/17 0439 08/18/17 0453 08/19/17 0834  CALCIUM 7.4* 8.3* 8.5*  8.8* 9.2  MG 1.6* 1.4*  --   --   --   PHOS 3.0 2.0*  --   --   --     CBC Recent Labs  Lab 08/16/17 0525 08/17/17 0439 08/19/17 0834  WBC 9.2 8.5 9.1  HGB 14.6 15.3 15.8  HCT 45.4 45.3 46.9  PLT 173 170 206    Coag's Recent Labs  Lab 08/14/17 2305  INR 1.27    Sepsis Markers Recent Labs  Lab 08/14/17 2232 08/15/17 0241 08/15/17 0621 08/16/17 0525 08/17/17 0439  LATICACIDVEN 3.79* 3.0* 1.0  --   --   PROCALCITON  --  0.14  --  0.25 0.13    ABG Recent Labs  Lab 08/14/17 2234 08/15/17 0330  PHART 7.198* 7.335*  PCO2ART 43.8 41.5  PO2ART 62.0* 46.6*    Liver Enzymes Recent Labs  Lab 08/14/17 2226  AST 29  ALT 11*  ALKPHOS 59  BILITOT 0.8  ALBUMIN 2.7*    Cardiac Enzymes Recent Labs  Lab 08/15/17 0621 08/15/17 1258 08/16/17 0525  TROPONINI 3.61* 4.57* 3.50*    Glucose Recent Labs  Lab 08/18/17 1653 08/18/17 1944 08/19/17 0027 08/19/17 0426 08/19/17 0757  GLUCAP 105* 192* 196* 138* 207*    Imaging Dg Chest Port 1 View  Result Date: 08/19/2017 CLINICAL DATA:  Idiopathic pulmonary fibrosis EXAM: PORTABLE CHEST 1 VIEW COMPARISON:  08/17/2017 FINDINGS: Prominent reticular lung markings bilaterally are  stable and compatible with chronic fibrosis. No superimposed infiltrate or effusion. Negative for edema. Dual lead pacemaker unchanged in position. IMPRESSION: Pulmonary fibrosis without interval change. No superimposed acute abnormality. Electronically Signed   By: Franchot Gallo M.D.   On: 08/19/2017 07:48     STUDIES:  CT chest : honeycombing and traction bronchiectasis noted, some consolidation in the left upper lobe, increased GGO in the bases  3/25 Left leg ultrasound> negative for DVT  CULTURES: 3.23 blood > neg  ANTIBIOTICS: vanc 3/23 x1 Zosyn 3/23 > 3/25  SIGNIFICANT EVENTS:   LINES/TUBES:   DISCUSSION: 77 y/o male with a complicated past medical history including IPF, emphysema, pulmonary hypertension, systolic heart  failure was admitted with acute on chronic respiratory failure in the setting of an NSTEMI and possible GI bleed.  No bleeding noted since admission, he has had some improved sensation of dyspnea with lasix, but oxygenation remains poor. In general I favor a diagnosis of acute pulmonary edema as the primary problem on top of his known chronic lung disease.  ON 3/26 he is oversedated and has atelectasis.  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  FAMILY  - Updates: updated wife 3/25 bedside  - Inter-disciplinary family meet or Palliative Care meeting due by:  Day 7  Maintain in ICU   Erick Colace ACNP-BC [place] Pager # [phone] OR # [phone] if no answer    08/19/2017, 9:49 AM

## 2017-08-19 NOTE — Plan of Care (Signed)
  Problem: Education: Goal: Knowledge of General Education information will improve Outcome: Progressing   Problem: Health Behavior/Discharge Planning: Goal: Ability to manage health-related needs will improve Outcome: Progressing   Problem: Clinical Measurements: Goal: Ability to maintain clinical measurements within normal limits will improve Outcome: Progressing Goal: Will remain free from infection Outcome: Progressing Goal: Diagnostic test results will improve Outcome: Progressing Goal: Respiratory complications will improve Outcome: Progressing Note:  HFNC to keep O2 sat  >88 Goal: Cardiovascular complication will be avoided Outcome: Progressing Note:  Permanent pacemaker in place, v pacing on demand   Problem: Activity: Goal: Risk for activity intolerance will decrease Outcome: Progressing   Problem: Nutrition: Goal: Adequate nutrition will be maintained Outcome: Progressing   Problem: Coping: Goal: Level of anxiety will decrease Outcome: Progressing   Problem: Elimination: Goal: Will not experience complications related to bowel motility Outcome: Progressing Goal: Will not experience complications related to urinary retention Outcome: Progressing   Problem: Pain Managment: Goal: General experience of comfort will improve Outcome: Progressing   Problem: Safety: Goal: Ability to remain free from injury will improve Outcome: Progressing   Problem: Skin Integrity: Goal: Risk for impaired skin integrity will decrease Outcome: Progressing

## 2017-08-20 LAB — BASIC METABOLIC PANEL
ANION GAP: 8 (ref 5–15)
BUN: 24 mg/dL — AB (ref 6–20)
CO2: 30 mmol/L (ref 22–32)
Calcium: 9.2 mg/dL (ref 8.9–10.3)
Chloride: 94 mmol/L — ABNORMAL LOW (ref 101–111)
Creatinine, Ser: 1.28 mg/dL — ABNORMAL HIGH (ref 0.61–1.24)
GFR calc Af Amer: >60 mL/min (ref >60)
GFR, EST NON AFRICAN AMERICAN: 53 mL/min — AB (ref >60)
GLUCOSE: 190 mg/dL — AB (ref 65–99)
POTASSIUM: 4 mmol/L (ref 3.5–5.1)
Sodium: 132 mmol/L — ABNORMAL LOW (ref 135–145)

## 2017-08-20 LAB — CULTURE, BLOOD (ROUTINE X 2)
CULTURE: NO GROWTH
CULTURE: NO GROWTH
Special Requests: ADEQUATE
Special Requests: ADEQUATE

## 2017-08-20 LAB — GLUCOSE, CAPILLARY
GLUCOSE-CAPILLARY: 157 mg/dL — AB (ref 65–99)
Glucose-Capillary: 175 mg/dL — ABNORMAL HIGH (ref 65–99)
Glucose-Capillary: 207 mg/dL — ABNORMAL HIGH (ref 65–99)

## 2017-08-20 MED ORDER — NON FORMULARY: 5.0000 mg | Freq: Every day | Status: DC

## 2017-08-20 MED ORDER — MELATONIN 3 MG PO TABS
6.0000 mg | ORAL_TABLET | Freq: Every day | ORAL | Status: DC
  Administered 2017-08-20 – 2017-08-27 (×8): 6 mg via ORAL
  Filled 2017-08-20 (×9): qty 2

## 2017-08-20 MED ORDER — METHYLPREDNISOLONE SODIUM SUCC 125 MG IJ SOLR
125.0000 mg | Freq: Four times a day (QID) | INTRAMUSCULAR | Status: DC
  Administered 2017-08-20 – 2017-08-23 (×12): 125 mg via INTRAVENOUS
  Filled 2017-08-20 (×12): qty 2

## 2017-08-20 NOTE — Progress Notes (Signed)
PULMONARY / CRITICAL CARE MEDICINE   Name: Wayne Torres MRN: [id number] DOB: [birth year]    ADMISSION DATE:  08/14/2017 CONSULTATION DATE:  08/14/2017  REFERRING MD:  EDP  CHIEF COMPLAINT:  Feels a little better  HISTORY OF PRESENT ILLNESS:   77 y/o male with a history of IPF, systolic heart failure and CAD was admitted with acute on chronic respiratory fialure with hypoxemia after a fall and ankle fracture.  There was concern for a GI bleed on admission but he has not had bleeding since admission.   SUBJECTIVE:  Still on high flow oxygen continues to feel somewhat better however he is frustrated, wants to leave the hospital VITAL SIGNS: Blood Pressure (Abnormal) 142/97   Pulse (Abnormal) 105   Temperature 98.6 F (37 C) (Oral)   Respiration (Abnormal) 21   Height 6' (1.829 m)   Weight 208 lb 5.4 oz (94.5 kg)   Oxygen Saturation (Abnormal) 87%   Body Mass Index 28.26 kg/m   HEMODYNAMICS:    VENTILATOR SETTINGS: FiO2 (%):  [50 %-60 %] 50 %  INTAKE / OUTPUT:  Intake/Output Summary (Last 24 hours) at 08/20/2017 1001 Last data filed at 08/20/2017 0800 Gross per 24 hour  Intake 104 ml  Output 1725 ml  Net -1621 ml     PHYSICAL EXAMINATION: General: This is 77 year old male patient resting comfortably in bed he is in no acute distress HEENT normocephalic atraumatic no jugular venous distention mucous membranes moist Pulmonary: Crackles bases, otherwise clear.  Equal chest rise no accessory use Cardiac: Regular rate and rhythm without murmur rub or gallop Abdomen: Soft nontender no organomegaly Extremities: Warm, dry, brisk cap refill, strong pulses. Neuro/psych: Awake alert oriented hard of hearing otherwise intact. GU: Voiding quantity sufficient   LABS:  BMET Recent Labs  Lab 08/18/17 0453 08/19/17 0834 08/20/17 0330  NA 138 136 132*  K 3.3* 3.9 4.0  CL 97* 99* 94*  CO2 _0 BUN 11 19 24*  CREATININE 0.97 1.07 1.28*  GLUCOSE 111* 212* 190*     Electrolytes Recent Labs  Lab 08/15/17 0621 08/16/17 0525  08/18/17 0453 08/19/17 0834 08/20/17 0330  CALCIUM 7.4* 8.3*   < > 8.8* 9.2 9.2  MG 1.6* 1.4*  --   --   --   --   PHOS 3.0 2.0*  --   --   --   --    < > = values in this interval not displayed.    CBC Recent Labs  Lab 08/16/17 0525 08/17/17 0439 08/19/17 0834  WBC 9.2 8.5 9.1  HGB 14.6 15.3 15.8  HCT 45.4 45.3 46.9  PLT 173 170 206    Coag's Recent Labs  Lab 08/14/17 2305  INR 1.27    Sepsis Markers Recent Labs  Lab 08/14/17 2232 08/15/17 0241 08/15/17 0621 08/16/17 0525 08/17/17 0439  LATICACIDVEN 3.79* 3.0* 1.0  --   --   PROCALCITON  --  0.14  --  0.25 0.13    ABG Recent Labs  Lab 08/14/17 2234 08/15/17 0330  PHART 7.198* 7.335*  PCO2ART 43.8 41.5  PO2ART 62.0* 46.6*    Liver Enzymes Recent Labs  Lab 08/14/17 2226  AST 29  ALT 11*  ALKPHOS 59  BILITOT 0.8  ALBUMIN 2.7*    Cardiac Enzymes Recent Labs  Lab 08/15/17 0621 08/15/17 1258 08/16/17 0525  TROPONINI 3.61* 4.57* 3.50*    Glucose Recent Labs  Lab 08/19/17 0426 08/19/17 0757 08/19/17 1131 08/19/17 1555 08/19/17 2214 08/20/17  0803  GLUCAP 138* 207* 172* 188* 181* 157*    Imaging Ct Angio Chest Pe W Or Wo Contrast  Result Date: 08/20/2017 CLINICAL DATA:  77 year old male with history of pulmonary fibrosis presenting with shortness of breath. EXAM: CT ANGIOGRAPHY CHEST WITH CONTRAST TECHNIQUE: Multidetector CT imaging of the chest was performed using the standard protocol during bolus administration of intravenous contrast. Multiplanar CT image reconstructions and MIPs were obtained to evaluate the vascular anatomy. CONTRAST:  <See Chart> ISOVUE-370 IOPAMIDOL (ISOVUE-370) INJECTION 76% COMPARISON:  Chest radiograph dated 08/19/2017 and CT dated 08/18/2017 and chest CT dated 04/05/2017 FINDINGS: Cardiovascular: There is mild cardiomegaly. Coronary vascular calcification and postsurgical changes of CABG. No  pericardial effusion. Left pectoral pacemaker device. There is mild atherosclerotic calcification of the thoracic aorta. No aneurysmal dilatation or evidence of dissection. Mild dilatation of the main pulmonary trunk suggestive of underlying pulmonary hypertension. Evaluation of the pulmonary arteries is limited due to respiratory motion artifact. No large or central pulmonary artery embolus identified. There is apparent non opacification of a subsegmental branch in the anterior left upper lobe (series 6 image 113) which appears somewhat similar to the study of 04/05/2017 and most likely represents scarring. Acute subsegmental pulmonary embolus favored less likely. Clinical correlation is recommended. Mediastinum/Nodes: There is no hilar or mediastinal adenopathy. The esophagus and the thyroid gland are grossly unremarkable. No mediastinal fluid collection. Lungs/Pleura: There is moderate centrilobular and paraseptal emphysema. There is background of pulmonary fibrosis as described on the recent high-resolution chest CT dated 08/18/2017. Consolidative change/fibrosis in the lingula appears similar to the prior CT. No new consolidative changes. There is no pleural effusion or pneumothorax. The central airways are patent. Upper Abdomen: Colonic diverticulosis. The visualized upper abdomen is otherwise unremarkable. Musculoskeletal: Degenerative changes of the spine. No acute osseous pathology. Review of the MIP images confirms the above findings. IMPRESSION: 1. An area of non opacification in the distal left upper lobe pulmonary artery branch appears somewhat similar to the study of 2018 and most likely related to underlying scarring. A small subsegmental pulmonary embolus is less likely. Clinical correlation is recommended. 2. Background of emphysema and pulmonary fibrosis/interstitial lung disease. No change in the appearance of the lungs since the CT of 08/18/2017. 3. Cardiomegaly. 4. Aortic Atherosclerosis  (ICD10-I70.0) and Emphysema (ICD10-J43.9). These results were called by telephone at the time of interpretation on 08/20/2017 at 12:10 am to Dr. Selena Batten, who verbally acknowledged these results. Electronically Signed   By: Anner Crete M.D.   On: 08/20/2017 00:12     STUDIES:  CT chest : honeycombing and traction bronchiectasis noted, some consolidation in the left upper lobe, increased GGO in the bases  3/25 Left leg ultrasound> negative for DVT CT angio 3/28: 1. An area of non opacification in the distal left upper lobe pulmonary artery branch appears somewhat similar to the study of 2018 and most likely related to underlying scarring. A small subsegmental pulmonary embolus is less likely. Clinical correlation is recommended. 2. Background of emphysema and pulmonary fibrosis/interstitial lung disease. No change in the appearance of the lungs since the CT of 08/18/2017. 3. Cardiomegaly. 4. Aortic Atherosclerosis \ CULTURES: 3.23 blood > neg  ANTIBIOTICS: vanc 3/23 x1 Zosyn 3/23 > 3/25  SIGNIFICANT EVENTS:   LINES/TUBES:   DISCUSSION: 77 y/o male with a complicated past medical history including IPF, emphysema, pulmonary hypertension, systolic heart failure was admitted with acute on chronic respiratory failure in the setting of an NSTEMI and possible GI bleed.  No bleeding  noted since admission, he has had some improved sensation of dyspnea with lasix, but oxygenation remains poor. In general I favor a diagnosis of acute pulmonary edema as the primary problem on top of his known chronic lung disease.  ON 3/26 he is oversedated and has atelectasis.  ASSESSMENT / PLAN:  Acute respiratory failure with hypoxemia in setting of acute pulmonary edema and atelectasis superimposed on IPF -IPF flare seems doubtful -His lower extremity Dopplers were negative for DVT -CT angio: neg for PE. + chronic changes.  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   Dark stools, no clear bleeding plan Continue PPI twice daily   Ankle fracture, casted Plan: Aloe up with Sophronia Simas as needed analgesia  Steroid-induced hyperglycemia Plan Sliding scale insulin   Insomnia Plan Add melatonin   FAMILY  - Updates: updated wife 3/25 bedside  - Inter-disciplinary family meet or Palliative Care meeting due by:  Day 7  DVT prophylaxis: scds SUP: PPI Diet: regular  Activity: oob Disposition : sdu    Erick Colace ACNP-BC [place] Pager # [phone] OR # [phone] if no answer    08/20/2017, 10:01 AM

## 2017-08-20 NOTE — Plan of Care (Signed)
  Problem: Clinical Measurements: Goal: Respiratory complications will improve Outcome: Progressing   Problem: Education: Goal: Knowledge of General Education information will improve Outcome: Progressing

## 2017-08-20 NOTE — Progress Notes (Signed)
Progress Note  Patient Name: Wayne Torres Date of Encounter: 08/20/2017  Primary Cardiologist: Dorris Carnes, MD   Subjective   77 year old gentleman with a history of coronary artery disease-status post coronary artery bypass grafting. He has a history of idiopathic pulmonary fibrosis, hypertension, hyperlipidemia.  He was brought into the emergency room with generalized weakness and had fallen and broken his right ankle. He was found to have sepsis  He has chronic combined systolic and diastolic congestive heart failure.  His baseline ejection fraction is around 35-40%.  Echocardiogram  yesterday showed that his ejection fraction is remains the same ( following the mild NSTEMI - Troponin of 4.57)   He had a net diuresis of 9.6 L so far this admission. Renal function is fairly stable.   Inpatient Medications    Scheduled Meds: . insulin aspart  0-15 Units Subcutaneous TID WC  . mouth rinse  15 mL Mouth Rinse BID  . Melatonin  6 mg Oral QHS  . pantoprazole  40 mg Oral BID  . potassium chloride  20 mEq Oral BID   Continuous Infusions: . sodium chloride    . methylPREDNISolone (SOLU-MEDROL) injection Stopped (08/20/17 1030)   PRN Meds: sodium chloride, ondansetron (ZOFRAN) IV, oxyCODONE-acetaminophen **AND** oxyCODONE   Vital Signs    Vitals:   08/20/17 1000 08/20/17 1100 08/20/17 1200 08/20/17 1300  BP:   (!) 143/75 121/78  Pulse: 99 82 76 77  Resp: 20 (!) 21 (!) 21 14  Temp:   97.6 F (36.4 C)   TempSrc:   Oral   SpO2: (!) 89% 92% 92% 95%  Weight:      Height:        Intake/Output Summary (Last 24 hours) at 08/20/2017 1343 Last data filed at 08/20/2017 1300 Gross per 24 hour  Intake 896 ml  Output 1755 ml  Net -859 ml   Filed Weights   08/17/17 0500 08/18/17 0600 08/19/17 0434  Weight: 218 lb 4.1 oz (99 kg) 215 lb 13.3 oz (97.9 kg) 208 lb 5.4 oz (94.5 kg)    Telemetry    NSR   - Personally Reviewed  ECG    NSR   - Personally  Reviewed  Physical Exam   Physical Exam: Blood pressure 121/78, pulse 77, temperature 97.6 F (36.4 C), temperature source Oral, resp. rate 14, height 6' (1.829 m), weight 208 lb 5.4 oz (94.5 kg), SpO2 95 %.  GEN:  Well nourished, well developed in no acute distress HEENT: Normal NECK: No JVD; No carotid bruits LYMPHATICS: No lymphadenopathy CARDIAC: RRR  RESPIRATORY:  Clear to auscultation without rales, wheezing or rhonchi  ABDOMEN: Soft, non-tender, non-distended MUSCULOSKELETAL:  No edema; No deformity  SKIN: Warm and dry NEUROLOGIC:  Alert and oriented x 3   Labs    Chemistry Recent Labs  Lab 08/14/17 2226  08/18/17 0453 08/19/17 0834 08/20/17 0330  NA 135   < > 138 136 132*  K 3.8   < > 3.3* 3.9 4.0  CL 109   < > 97* 99* 94*  CO2 16*   < > _0 GLUCOSE 119*   < > 111* 212* 190*  BUN 31*   < > 11 19 24*  CREATININE 2.08*   < > 0.97 1.07 1.28*  CALCIUM 7.3*   < > 8.8* 9.2 9.2  PROT 5.5*  --   --   --   --   ALBUMIN 2.7*  --   --   --   --  AST 29  --   --   --   --   ALT 11*  --   --   --   --   ALKPHOS 59  --   --   --   --   BILITOT 0.8  --   --   --   --   GFRNONAA 29*   < > >60 >60 53*  GFRAA 34*   < > >60 >60 >60  ANIONGAP 10   < > _0 < > = values in this interval not displayed.     Hematology Recent Labs  Lab 08/16/17 0525 08/17/17 0439 08/19/17 0834  WBC 9.2 8.5 9.1  RBC 4.51 4.65 4.80  HGB 14.6 15.3 15.8  HCT 45.4 45.3 46.9  MCV 100.7* 97.4 97.7  MCH 32.4 32.9 32.9  MCHC 32.2 33.8 33.7  RDW 14.6 13.7 13.0  PLT 173 170 206    Cardiac Enzymes Recent Labs  Lab 08/15/17 0241 08/15/17 0621 08/15/17 1258 08/16/17 0525  TROPONINI 1.16* 3.61* 4.57* 3.50*   No results for input(s): TROPIPOC in the last 168 hours.   BNPNo results for input(s): BNP, PROBNP in the last 168 hours.   DDimer No results for input(s): DDIMER in the last 168 hours.   Radiology    Ct Angio Chest Pe W Or Wo Contrast  Result Date:  08/20/2017 CLINICAL DATA:  77 year old male with history of pulmonary fibrosis presenting with shortness of breath. EXAM: CT ANGIOGRAPHY CHEST WITH CONTRAST TECHNIQUE: Multidetector CT imaging of the chest was performed using the standard protocol during bolus administration of intravenous contrast. Multiplanar CT image reconstructions and MIPs were obtained to evaluate the vascular anatomy. CONTRAST:  <See Chart> ISOVUE-370 IOPAMIDOL (ISOVUE-370) INJECTION 76% COMPARISON:  Chest radiograph dated 08/19/2017 and CT dated 08/18/2017 and chest CT dated 04/05/2017 FINDINGS: Cardiovascular: There is mild cardiomegaly. Coronary vascular calcification and postsurgical changes of CABG. No pericardial effusion. Left pectoral pacemaker device. There is mild atherosclerotic calcification of the thoracic aorta. No aneurysmal dilatation or evidence of dissection. Mild dilatation of the main pulmonary trunk suggestive of underlying pulmonary hypertension. Evaluation of the pulmonary arteries is limited due to respiratory motion artifact. No large or central pulmonary artery embolus identified. There is apparent non opacification of a subsegmental branch in the anterior left upper lobe (series 6 image 113) which appears somewhat similar to the study of 04/05/2017 and most likely represents scarring. Acute subsegmental pulmonary embolus favored less likely. Clinical correlation is recommended. Mediastinum/Nodes: There is no hilar or mediastinal adenopathy. The esophagus and the thyroid gland are grossly unremarkable. No mediastinal fluid collection. Lungs/Pleura: There is moderate centrilobular and paraseptal emphysema. There is background of pulmonary fibrosis as described on the recent high-resolution chest CT dated 08/18/2017. Consolidative change/fibrosis in the lingula appears similar to the prior CT. No new consolidative changes. There is no pleural effusion or pneumothorax. The central airways are patent. Upper Abdomen:  Colonic diverticulosis. The visualized upper abdomen is otherwise unremarkable. Musculoskeletal: Degenerative changes of the spine. No acute osseous pathology. Review of the MIP images confirms the above findings. IMPRESSION: 1. An area of non opacification in the distal left upper lobe pulmonary artery branch appears somewhat similar to the study of 2018 and most likely related to underlying scarring. A small subsegmental pulmonary embolus is less likely. Clinical correlation is recommended. 2. Background of emphysema and pulmonary fibrosis/interstitial lung disease. No change in the appearance of the lungs since the CT of 08/18/2017. 3. Cardiomegaly.  4. Aortic Atherosclerosis (ICD10-I70.0) and Emphysema (ICD10-J43.9). These results were called by telephone at the time of interpretation on 08/20/2017 at 12:10 am to Dr. Selena Batten, who verbally acknowledged these results. Electronically Signed   By: Anner Crete M.D.   On: 08/20/2017 00:12   Ct Chest High Resolution  Result Date: 08/19/2017 CLINICAL DATA:  Inpatient. Admitted with acute on chronic respiratory failure after fall with ankle fracture. 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 History of IPF. Former smoker. EXAM: CT CHEST WITHOUT CONTRAST TECHNIQUE: Multidetector CT imaging of the chest was performed following the standard protocol without intravenous contrast. High resolution imaging of the lungs, as well as inspiratory and expiratory imaging, was performed. COMPARISON:  Chest radiograph from one day prior. 08/15/2017 chest CT. FINDINGS: Cardiovascular: Stable mild cardiomegaly. No significant pericardial fluid/thickening. Stable configuration of 2 lead left subclavian pacemaker with lead tips in the right atrium and right ventricular apex. Three-vessel coronary atherosclerosis status post CABG. Atherosclerotic nonaneurysmal thoracic aorta. Stable dilated main pulmonary artery (3.7 cm  diameter). Mediastinum/Nodes: No discrete thyroid nodules. Unremarkable esophagus. No pathologically enlarged axillary, mediastinal or discrete hilar lymph nodes, noting limited sensitivity for the detection of hilar adenopathy on this noncontrast study. Lungs/Pleura: No pneumothorax. Trace dependent bilateral pleural effusions, decreased bilaterally. Moderate centrilobular and paraseptal emphysema. There is sharply marginated bandlike consolidation in the left upper lobe with associated volume loss and distortion, stable in thickness although increased in density since 04/05/2017 chest CT. The previously visualized consolidation in medial basilar lower lobes bilaterally on the 08/15/2017 chest CT has largely resolved, compatible with improved aeration and decreased atelectasis. No acute consolidative airspace disease or lung masses. Basilar right upper lobe 5 mm solid pulmonary nodule (series 6/image 84) appears slightly increased from 3 mm on 04/05/2017 chest CT. Separate basilar right upper lobe 5 mm solid pulmonary nodule (series 6/image 73) is stable since 04/05/2017 chest CT. No additional significant pulmonary nodules. No significant lobular air trapping on the expiration sequence. Redemonstrated is patchy confluent subpleural reticulation and ground-glass attenuation throughout both lungs with associated mild traction bronchiectasis and architectural distortion, with a dependent basilar gradient to these findings. Redemonstrated are unusual confluent prominent cystic airspaces in the subpleural dependent lower lobes, favored to represent a combination of emphysema and honeycombing. These findings have not appreciably progressed since 04/05/2017 chest CT. Upper abdomen: Tiny hiatal hernia. Musculoskeletal: No aggressive appearing focal osseous lesions. Intact sternotomy wires. 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 Moderate thoracic spondylosis.  IMPRESSION: 1. 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 2. Improved aeration at the lung bases, with resolved consolidation from the 08/15/2017 chest CT, compatible with resolved atelectasis. No acute consolidative airspace disease to suggest a pneumonia. 3. Stable bandlike radiation fibrosis in the left upper lobe, mildly increased in density since 04/05/2017 chest CT, probably due to superimposed mild hypoventilation. No definite local tumor recurrence. Recommend attention on follow-up chest CT in 3 months. 4. Small 5 mm solid right upper lobe pulmonary nodule appears slightly increased in size since 04/05/2017 chest CT. Recommend attention on follow-up chest CT in 3 months. 5. Trace bilateral pleural effusions, decreased bilaterally. Cardiomegaly. 6. Stable dilated main pulmonary artery, compatible with chronic pulmonary arterial hypertension. Aortic Atherosclerosis (ICD10-I70.0) and Emphysema (ICD10-J43.9). Electronically Signed   By: Ilona Sorrel M.D.   On: 08/19/2017 10:15   Dg Chest Port 1  View  Result Date: 08/19/2017 CLINICAL DATA:  Idiopathic pulmonary fibrosis EXAM: PORTABLE CHEST 1 VIEW COMPARISON:  08/17/2017 FINDINGS: Prominent reticular lung markings bilaterally are stable and compatible with chronic fibrosis. No superimposed infiltrate or effusion. Negative for edema. Dual lead pacemaker unchanged in position. IMPRESSION: Pulmonary fibrosis without interval change. No superimposed acute abnormality. Electronically Signed   By: Franchot Gallo M.D.   On: 08/19/2017 07:48    Cardiac Studies     Patient Profile     77 y.o. male with a history of coronary artery disease, chronic combined systolic and diastolic congestive heart failure and significant pulmonary fibrosis.  He is admitted with generalized weakness and sepsis.  Assessment & Plan    1.  Chronic combined systolic  and diastolic congestive heart failure:   his ejection fraction remained stable.  Continue current medications. Continues to diurese well .  IV lasix has been stopped .  Net diuresis of 9.6 L so far. Breathing is better  Weaning his O2.   PCCM is following   2.  Right ankle fracture: At some point he will likely need to go for surgical repair of his ankle.  I would prefer that we do a regional block which would be very low risk from a cardiac standpoint.   3. Hypokalemia:   Better     For questions or updates, please contact [place] Please consult [email or web address] for contact info under Cardiology/STEMI.      Signed, Mertie Moores, MD  08/20/2017, 1:43 PM

## 2017-08-21 DIAGNOSIS — J9601 Acute respiratory failure with hypoxia: Secondary | ICD-10-CM

## 2017-08-21 DIAGNOSIS — R748 Abnormal levels of other serum enzymes: Secondary | ICD-10-CM

## 2017-08-21 LAB — GLUCOSE, CAPILLARY
GLUCOSE-CAPILLARY: 145 mg/dL — AB (ref 65–99)
GLUCOSE-CAPILLARY: 173 mg/dL — AB (ref 65–99)
Glucose-Capillary: 165 mg/dL — ABNORMAL HIGH (ref 65–99)
Glucose-Capillary: 146 mg/dL — ABNORMAL HIGH (ref 65–99)

## 2017-08-21 LAB — BASIC METABOLIC PANEL
Anion gap: 10 (ref 5–15)
BUN: 27 mg/dL — AB (ref 6–20)
CO2: 26 mmol/L (ref 22–32)
Calcium: 9.2 mg/dL (ref 8.9–10.3)
Chloride: 99 mmol/L — ABNORMAL LOW (ref 101–111)
Creatinine, Ser: 1.01 mg/dL (ref 0.61–1.24)
GFR calc Af Amer: >60 mL/min (ref >60)
GFR calc non Af Amer: >60 mL/min (ref >60)
GLUCOSE: 157 mg/dL — AB (ref 65–99)
POTASSIUM: 4 mmol/L (ref 3.5–5.1)
Sodium: 135 mmol/L (ref 135–145)

## 2017-08-21 MED ORDER — ENOXAPARIN SODIUM 40 MG/0.4ML ~~LOC~~ SOLN
40.0000 mg | SUBCUTANEOUS | Status: DC
  Administered 2017-08-22 – 2017-08-28 (×7): 40 mg via SUBCUTANEOUS
  Filled 2017-08-21 (×7): qty 0.4

## 2017-08-21 MED ORDER — ENOXAPARIN SODIUM 40 MG/0.4ML ~~LOC~~ SOLN: 40.0000 mg | SUBCUTANEOUS | Status: DC

## 2017-08-21 MED ORDER — FUROSEMIDE 40 MG PO TABS
40.0000 mg | ORAL_TABLET | Freq: Every day | ORAL | Status: DC
  Administered 2017-08-21 – 2017-08-28 (×8): 40 mg via ORAL
  Filled 2017-08-21 (×7): qty 1

## 2017-08-21 MED ORDER — POLYETHYLENE GLYCOL 3350 17 G PO PACK
17.0000 g | PACK | Freq: Every day | ORAL | Status: DC | PRN
  Administered 2017-08-21: 17 g via ORAL
  Filled 2017-08-21: qty 1

## 2017-08-21 MED ORDER — ALPRAZOLAM 0.25 MG PO TABS
0.2500 mg | ORAL_TABLET | Freq: Two times a day (BID) | ORAL | Status: DC | PRN
  Administered 2017-08-21 – 2017-08-27 (×2): 0.25 mg via ORAL
  Filled 2017-08-21 (×2): qty 1

## 2017-08-21 MED ORDER — CITALOPRAM HYDROBROMIDE 20 MG PO TABS
20.0000 mg | ORAL_TABLET | Freq: Every day | ORAL | Status: DC
  Administered 2017-08-21 – 2017-08-28 (×8): 20 mg via ORAL
  Filled 2017-08-21 (×8): qty 1

## 2017-08-21 NOTE — Progress Notes (Signed)
Off IV lasix - I's/O's matched overnight. LVEF 35-40%. Will likely need maintenance diuretic -consider lasix 40 mg po daily. Creatinine normal today. No further suggestions at this time. Dr. Acie Fredrickson recommends regional anesthesia if possible for ankle surgery to reduce cardiac risk.  Will sign-off. Call with questions.   Pixie Casino, MD, [facility], [place] Director of the [facility]  [facility] Diplomate of the American Board of Clinical Lipidology Attending Cardiologist  Direct Dial: [phone]  Fax: [phone]  Website:  [email or web address]

## 2017-08-21 NOTE — Progress Notes (Signed)
[place] TEAM 1 - Stepdown/ICU TEAM  ANDREA COLGLAZIER  YKZ:[id number] DOB: 09/16/1940 DOA: 08/14/2017 PCP: [name], PA-C    Brief Narrative:  77 y/o male with a history of IPF, systolic heart failure and CAD who was admitted with acute on chronic respiratory fialure with hypoxemia after a fall and ankle fracture.  There was concern for a GI bleed on admission but he has not had bleeding since admission.   Significant Events: 3/23 admit 3/30 [facility] assumed care   Subjective:  Resting comfortably in bedside chair eating supper w/o trouble.  Denies CP, SOB, N/V, or abdom pain.    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 rate 18, height 6' (1.829 m), weight 97.3 kg (214 lb 8.1 oz), SpO2 94 %.  Intake/Output Summary (Last 24 hours) at 08/21/2017 1812 Last data filed at 08/21/2017 1700 Gross  per 24 hour  Intake 1610 ml  Output 960 ml  Net 650 ml   Filed Weights   08/18/17 0600 08/19/17 0434 08/21/17 0500  Weight: 97.9 kg (215 lb 13.3 oz) 94.5 kg (208 lb 5.4 oz) 97.3 kg (214 lb 8.1 oz)    Examination: General: No acute respiratory distress Lungs: Clear to auscultation bilaterally - distant bs th/o  Cardiovascular: Regular rate and rhythm without murmur Abdomen: Nontender, nondistended, soft, bowel sounds positive, no rebound, no ascites, no appreciable mass Extremities: trace B LE edema   CBC: Recent Labs  Lab 08/14/17 2226  08/16/17 0525 08/17/17 0439 08/19/17 0834  WBC 15.3*   < > 9.2 8.5 9.1  NEUTROABS 12.3*  --   --   --   --   HGB 13.4   < > 14.6 15.3 15.8  HCT 41.6   < > 45.4 45.3 46.9  MCV 103.5*   < > 100.7* 97.4 97.7  PLT 194   < > 173 170 206   < > = values in this interval not displayed.   Basic Metabolic Panel: Recent Labs  Lab 08/15/17 0621 08/16/17 0525  08/19/17 0834 08/20/17 0330 08/21/17 0844  NA 138 138   < > 136 132* 135  K 4.4 3.6   < > 3.9 4.0 4.0  CL 110 100*   < > 99* 94* 99*  CO2 19* 25   < > 25 30 26  GLUCOSE 97 103*   < > 212* 190* 157*  BUN 31* 18   < > 19 24* 27*  CREATININE 1.62* 1.45*   < > 1.07 1.28* 1.01  CALCIUM 7.4* 8.3*   < > 9.2 9.2 9.2  MG 1.6* 1.4*  --   --   --   --   PHOS 3.0 2.0*  --   --   --   --    < > = values in this interval not displayed.   GFR: Estimated Creatinine Clearance: 76.4 mL/min (by C-G formula based on SCr of 1.01 mg/dL).  Liver Function Tests: Recent Labs  Lab 08/14/17 2226  AST 29  ALT 11*  ALKPHOS 59  BILITOT 0.8  PROT 5.5*  ALBUMIN 2.7*    Coagulation Profile: Recent Labs  Lab 08/14/17 2305  INR 1.27    Cardiac Enzymes: Recent Labs  Lab 08/14/17 2226 08/15/17 0241 08/15/17 0621 08/15/17 1258 08/16/17 0525  TROPONINI 0.41* 1.16* 3.61* 4.57* 3.50*    HbA1C: Hgb A1c MFr Bld  Date/Time Value Ref Range Status  02/08/2015 12:11 PM 5.8 4.6 - 6.5 % Final     Comment:    Glycemic Control Guidelines for People with Diabetes:Non Diabetic:  <6%Goal of Therapy: <7%Additional Action Suggested:  >8%     CBG: Recent Labs  Lab 08/20/17 1618 08/21/17 0007 08/21/17 0758 08/21/17 1127 08/21/17 1635  GLUCAP 207* 145* 165* 173* 146*    Recent Results (from the past 240 hour(s))  Culture, blood (routine x 2)     Status: None   Collection Time: 08/14/17 10:10 PM  Result Value Ref Range Status   Specimen Description BLOOD LEFT HAND  Final   Special Requests IN PEDIATRIC BOTTLE Blood Culture adequate volume  Final   Culture   Final    NO GROWTH 5 DAYS Performed at [facility], [place] [address] [place], [place] [address]    Report Status 08/20/2017 FINAL  Final  Culture, blood (routine x 2)     Status: None   Collection Time: 08/14/17 10:20 PM  Result Value Ref Range Status   Specimen Description BLOOD RIGHT ANTECUBITAL  Final   Special Requests   Final    BOTTLES DRAWN AEROBIC AND ANAEROBIC Blood Culture adequate volume   Culture   Final    NO GROWTH 5 DAYS Performed at [facility], [place] [address], [place], [place] [address]    Report Status 08/20/2017 FINAL  Final  MRSA PCR Screening     Status: None   Collection Time: 08/15/17  3:30 AM  Result Value Ref Range Status   MRSA by PCR NEGATIVE NEGATIVE Final    Comment:        The GeneXpert MRSA Assay (FDA approved for NASAL specimens only), is one component of a comprehensive MRSA colonization surveillance program. It is not intended to diagnose MRSA infection nor to guide or monitor treatment for MRSA infections. Performed at [facility], [place] [address] [place], [place] [address]      Scheduled Meds: . insulin aspart  0-15 Units Subcutaneous TID WC  . mouth rinse  15 mL Mouth Rinse BID  . Melatonin  6 mg Oral QHS  . methylPREDNISolone (SOLU-MEDROL) injection  125 mg Intravenous Q6H  . pantoprazole  40 mg Oral BID  . potassium chloride  20 mEq Oral BID      LOS: 6 days   Cherene Altes, MD [facility] Office  [phone] Pager -  Text Page per Shea Evans as per below:  On-Call/Text Page:      [email or web address]      password [email or web address]  If 7PM-7AM, please contact night-coverage [email or web address] Password [facility] 08/21/2017, 6:12 PM

## 2017-08-21 NOTE — Plan of Care (Signed)
AA+Ox4. Limited code status. NWB to RLE. ML PIV to LUE. HFNC @ 20 L and 50%. Still awaiting step-down bed.

## 2017-08-22 LAB — CBC
HCT: 46.8 % (ref 39.0–52.0)
Hemoglobin: 15.6 g/dL (ref 13.0–17.0)
MCH: 32.5 pg (ref 26.0–34.0)
MCHC: 33.3 g/dL (ref 30.0–36.0)
MCV: 97.5 fL (ref 78.0–100.0)
PLATELETS: 223 10*3/uL (ref 150–400)
RBC: 4.8 MIL/uL (ref 4.22–5.81)
RDW: 12.9 % (ref 11.5–15.5)
WBC: 9.1 10*3/uL (ref 4.0–10.5)

## 2017-08-22 LAB — BASIC METABOLIC PANEL
Anion gap: 11 (ref 5–15)
BUN: 33 mg/dL — ABNORMAL HIGH (ref 6–20)
CALCIUM: 9 mg/dL (ref 8.9–10.3)
CO2: 26 mmol/L (ref 22–32)
Chloride: 98 mmol/L — ABNORMAL LOW (ref 101–111)
Creatinine, Ser: 1.24 mg/dL (ref 0.61–1.24)
GFR calc non Af Amer: 55 mL/min — ABNORMAL LOW (ref >60)
Glucose, Bld: 171 mg/dL — ABNORMAL HIGH (ref 65–99)
Potassium: 3.8 mmol/L (ref 3.5–5.1)
SODIUM: 135 mmol/L (ref 135–145)

## 2017-08-22 LAB — GLUCOSE, CAPILLARY
GLUCOSE-CAPILLARY: 156 mg/dL — AB (ref 65–99)
GLUCOSE-CAPILLARY: 196 mg/dL — AB (ref 65–99)
Glucose-Capillary: 163 mg/dL — ABNORMAL HIGH (ref 65–99)
Glucose-Capillary: 233 mg/dL — ABNORMAL HIGH (ref 65–99)

## 2017-08-22 NOTE — Progress Notes (Addendum)
PROGRESS NOTE    Wayne Torres  NMM:[id number] DOB: 1941-05-03 DOA: 08/14/2017 PCP: [name], PA-C   Brief Narrative: Wayne Torres is a 77 y.o. male with a history of IPF, systolic heart failure and CAD who was admitted with acute on chronic respiratory fialure with hypoxemia after a fall and ankle fracture. There was concern for a GI bleed on admission but he has not had bleeding since admission. He is currently on a steroid taper with attempts to wean oxygen.   Assessment & Plan:   Active Problems:   Acute GI bleeding   CHB (complete heart block) (HCC)   Chronic combined systolic and diastolic CHF (congestive heart failure) (HCC)   Acute on chronic respiratory failure (HCC)   Shock (HCC)   Type 2 myocardial infarction (HCC)   Ankle fracture   Acute on chronic respiratory failure with hypoxia Secondary to IPF and acute pulmonary edema secondary to heart failure. Currently on FiO2 of 50% -Continue steroid taper per CCM recommendations -Wean oxygen as able  Acute on chronic combined systolic/diastolic heart failure Cardiology consulted and managing. Improvement with diuresis -Cardiology recommendations: Lasix PO  Anxiety -Continue Xanax and Celeca  NSTEMI CAD S/p CABG. Asymptomatic. Likely type 2 NSTEMI per cardiology evaluation.  Pulmonary hypertension Secondary to IPF.  Dark stools FOBT positive. Hemoglobin stable. Outpatient GI follow-up  Right ankle fracture Outpatient management  Hyperglycemia Secondary to steroids -Continue SSI  Complete heart block S/p [employer] Jude pacemaker    DVT prophylaxis: Lovenox Code Status:   Code Status: Partial Code Family Communication: None at bedside Disposition Plan: Home with home health PT and DME when oxygen weaned down.   Consultants:   PCCM  Cardiology (signed off 3/30)  Procedures:   None  Antimicrobials:  Zosyn (3/23>>3/25)  Vancomycin (3/23)    Subjective: Breathing better. Walked  around without issue.  Objective: Vitals:   08/22/17 1000 08/22/17 1100 08/22/17 1158 08/22/17 1200  BP: 120/66 128/81  (!) 144/75  Pulse: 96 73  71  Resp: (!) _0 Temp:   97.9 F (36.6 C)   TempSrc:   Oral   SpO2: 90% 93%  92%  Weight:      Height:        Intake/Output Summary (Last 24 hours) at 08/22/2017 1311 Last data filed at 08/22/2017 1200 Gross per 24 hour  Intake 1255 ml  Output 2075 ml  Net -820 ml   Filed Weights   08/19/17 0434 08/21/17 0500 08/22/17 0457  Weight: 94.5 kg (208 lb 5.4 oz) 97.3 kg (214 lb 8.1 oz) 94.6 kg (208 lb 8.9 oz)    Examination:  General exam: Appears calm and comfortable Respiratory system: Clear to auscultation. Respiratory effort normal. Cardiovascular system: S1 & S2 heard, RRR. No murmurs. Gastrointestinal system: Abdomen is nondistended, soft and nontender. No organomegaly or masses felt. Normal bowel sounds heard. Central nervous system: Alert and oriented. No focal neurological deficits. Extremities: No edema. No calf tenderness Skin: No cyanosis. No rashes Psychiatry: Judgement and insight appear normal. Mood & affect appropriate.     Data Reviewed: I have personally reviewed following labs and imaging studies  CBC: Recent Labs  Lab 08/16/17 0525 08/17/17 0439 08/19/17 0834 08/22/17 0456  WBC 9.2 8.5 9.1 9.1  HGB 14.6 15.3 15.8 15.6  HCT 45.4 45.3 46.9 46.8  MCV 100.7* 97.4 97.7 97.5  PLT 173 170 206 465   Basic Metabolic Panel: Recent Labs  Lab 08/16/17 0525  08/18/17 0453 08/19/17  9223 08/20/17 0330 08/21/17 0844 08/22/17 0456  NA 138   < > 138 136 132* 135 135  K 3.6   < > 3.3* 3.9 4.0 4.0 3.8  CL 100*   < > 97* 99* 94* 99* 98*  CO2 25   < > _0 GLUCOSE 103*   < > 111* 212* 190* 157* 171*  BUN 18   < > 11 19 24* 27* 33*  CREATININE 1.45*   < > 0.97 1.07 1.28* 1.01 1.24  CALCIUM 8.3*   < > 8.8* 9.2 9.2 9.2 9.0  MG 1.4*  --   --   --   --   --   --   PHOS 2.0*  --   --   --   --   --    --    < > = values in this interval not displayed.   GFR: Estimated Creatinine Clearance: 61.4 mL/min (by C-G formula based on SCr of 1.24 mg/dL). Liver Function Tests: No results for input(s): AST, ALT, ALKPHOS, BILITOT, PROT, ALBUMIN in the last 168 hours. No results for input(s): LIPASE, AMYLASE in the last 168 hours. No results for input(s): AMMONIA in the last 168 hours. Coagulation Profile: No results for input(s): INR, PROTIME in the last 168 hours. Cardiac Enzymes: Recent Labs  Lab 08/16/17 0525  TROPONINI 3.50*   BNP (last 3 results) Recent Labs    06/18/17 0946  PROBNP 476   HbA1C: No results for input(s): HGBA1C in the last 72 hours. CBG: Recent Labs  Lab 08/21/17 0758 08/21/17 1127 08/21/17 1635 08/22/17 0856 08/22/17 1208  GLUCAP 165* 173* 146* 156* 163*   Lipid Profile: No results for input(s): CHOL, HDL, LDLCALC, TRIG, CHOLHDL, LDLDIRECT in the last 72 hours. Thyroid Function Tests: No results for input(s): TSH, T4TOTAL, FREET4, T3FREE, THYROIDAB in the last 72 hours. Anemia Panel: No results for input(s): VITAMINB12, FOLATE, FERRITIN, TIBC, IRON, RETICCTPCT in the last 72 hours. Sepsis Labs: Recent Labs  Lab 08/16/17 0525 08/17/17 0439  PROCALCITON 0.25 0.13    Recent Results (from the past 240 hour(s))  Culture, blood (routine x 2)     Status: None   Collection Time: 08/14/17 10:10 PM  Result Value Ref Range Status   Specimen Description BLOOD LEFT HAND  Final   Special Requests IN PEDIATRIC BOTTLE Blood Culture adequate volume  Final   Culture   Final    NO GROWTH 5 DAYS Performed at [facility], [place] [address] [place], [place] [address]    Report Status 08/20/2017 FINAL  Final  Culture, blood (routine x 2)     Status: None   Collection Time: 08/14/17 10:20 PM  Result Value Ref Range Status   Specimen Description BLOOD RIGHT ANTECUBITAL  Final   Special Requests   Final    BOTTLES DRAWN AEROBIC AND ANAEROBIC Blood Culture  adequate volume   Culture   Final    NO GROWTH 5 DAYS Performed at [facility], [place] [address] [place], [place] [address]    Report Status 08/20/2017 FINAL  Final  MRSA PCR Screening     Status: None   Collection Time: 08/15/17  3:30 AM  Result Value Ref Range Status   MRSA by PCR NEGATIVE NEGATIVE Final    Comment:        The GeneXpert MRSA Assay (FDA approved for NASAL specimens only), is one component of a comprehensive MRSA colonization surveillance program. It  is not intended to diagnose MRSA infection nor to guide or monitor treatment for MRSA infections. Performed at [facility], [place] [address] [place], [place] [address]          Radiology Studies: No results found.      Scheduled Meds: . citalopram  20 mg Oral Daily  . enoxaparin (LOVENOX) injection  40 mg Subcutaneous Q24H  . furosemide  40 mg Oral Daily  . insulin aspart  0-15 Units Subcutaneous TID WC  . mouth rinse  15 mL Mouth Rinse BID  . Melatonin  6 mg Oral QHS  . methylPREDNISolone (SOLU-MEDROL) injection  125 mg Intravenous Q6H  . pantoprazole  40 mg Oral BID  . potassium chloride  20 mEq Oral BID   Continuous Infusions:   LOS: 7 days     Cordelia Poche, MD [facility] 08/22/2017, 1:11 PM Pager: [phone]  If 7PM-7AM, please contact night-coverage [email or web address] Password [email or web address] 08/22/2017, 1:11 PM

## 2017-08-23 LAB — GLUCOSE, CAPILLARY
GLUCOSE-CAPILLARY: 162 mg/dL — AB (ref 65–99)
GLUCOSE-CAPILLARY: 142 mg/dL — AB (ref 65–99)
GLUCOSE-CAPILLARY: 181 mg/dL — AB (ref 65–99)
Glucose-Capillary: 154 mg/dL — ABNORMAL HIGH (ref 65–99)

## 2017-08-23 LAB — BASIC METABOLIC PANEL
ANION GAP: 9 (ref 5–15)
BUN: 37 mg/dL — ABNORMAL HIGH (ref 6–20)
CO2: 29 mmol/L (ref 22–32)
Calcium: 8.8 mg/dL — ABNORMAL LOW (ref 8.9–10.3)
Chloride: 98 mmol/L — ABNORMAL LOW (ref 101–111)
Creatinine, Ser: 1.12 mg/dL (ref 0.61–1.24)
GFR calc non Af Amer: >60 mL/min (ref >60)
GLUCOSE: 162 mg/dL — AB (ref 65–99)
POTASSIUM: 4 mmol/L (ref 3.5–5.1)
Sodium: 136 mmol/L (ref 135–145)

## 2017-08-23 LAB — CBC
HEMATOCRIT: 47.4 % (ref 39.0–52.0)
Hemoglobin: 15.8 g/dL (ref 13.0–17.0)
MCH: 32.6 pg (ref 26.0–34.0)
MCHC: 33.3 g/dL (ref 30.0–36.0)
MCV: 97.9 fL (ref 78.0–100.0)
Platelets: 247 10*3/uL (ref 150–400)
RBC: 4.84 MIL/uL (ref 4.22–5.81)
RDW: 13 % (ref 11.5–15.5)
WBC: 9.2 10*3/uL (ref 4.0–10.5)

## 2017-08-23 MED ORDER — PREDNISONE 50 MG PO TABS
50.0000 mg | ORAL_TABLET | Freq: Every day | ORAL | Status: DC
  Administered 2017-08-24 – 2017-08-28 (×5): 50 mg via ORAL
  Filled 2017-08-23 (×4): qty 1

## 2017-08-23 NOTE — Progress Notes (Signed)
08/23/17  1300 hrs: The patient has declined multiple attempts by this RN to ambulate the patient up to the chair. The patient was reeducated and encouraged to get out of bed and into the chair, but he still refuses, stating: "I will get up for my bath tonight".

## 2017-08-23 NOTE — Care Management Important Message (Signed)
Important Message  Patient Details  Name: Wayne Torres MRN: [id number] Date of Birth: 02-May-1941   Medicare Important Message Given:  Yes    Orbie Pyo 08/23/2017, 2:44 PM

## 2017-08-23 NOTE — Progress Notes (Signed)
Physical Therapy Treatment Patient Details Name: Wayne Torres MRN: [id number] DOB: 1940/11/13 Today's Date: 08/23/2017    History of Present Illness Pt is a 77 y.o. male admitted for fall with associated right ankle fracture. He was found to be have septic shock and hypoxia. Pt has a history of multiple recent falls. PMH: IPF, systolic CHF, CAD s/p CABG, HTN, PVD, and HLD.    PT Comments    Pt progressing towards functional mobility goals. Pt performed sit<>stands from bed and stand pivot transfer to from bed to recliner chair min assist with RW. Pt demonstrates mild impulsivity this session and requires mod to max cues to maintain NWB on RLE and for safe management of RW. Pt on HFNC with SpO2 in high 80s to low 90s throughout session. Current plan remains appropriate. Will continue to follow acutely and progress as tolerated.    Follow Up Recommendations  Home health PT;Supervision/Assistance - 24 hour     Equipment Recommendations  Wheelchair (measurements PT);Wheelchair cushion (measurements PT);Other (comment)    Recommendations for Other Services       Precautions / Restrictions Precautions Precautions: Fall Restrictions Weight Bearing Restrictions: Yes RLE Weight Bearing: Non weight bearing    Mobility  Bed Mobility Overal bed mobility: Needs Assistance Bed Mobility: Supine to Sit     Supine to sit: Supervision     General bed mobility comments: supervision for safety  Transfers Overall transfer level: Needs assistance Equipment used: Rolling walker (2 wheeled) Transfers: Sit to/from [facility] Sit to Stand: Min assist Stand pivot transfers: Min assist       General transfer comment: min assist for safety and to stabilize RW. pt requried multiple VCs and demonstration for proper hand placement and to maintain NWB status. Pt performed sit<>stand from bed in lowest position 5x with pt R foot on PT foot to maintain NWB. min assist for stand  pivot transfer to chair for safety.  Ambulation/Gait                 Stairs            Wheelchair Mobility    Modified Rankin (Stroke Patients Only)       Balance Overall balance assessment: Needs assistance Sitting-balance support: Feet supported Sitting balance-Leahy Scale: Good     Standing balance support: During functional activity;Bilateral upper extremity supported Standing balance-Leahy Scale: Poor Standing balance comment: requires bil UE on RW for support                            Cognition Arousal/Alertness: Awake/alert Behavior During Therapy: Impulsive Overall Cognitive Status: Impaired/Different from baseline Area of Impairment: Safety/judgement;Following commands                       Following Commands: Follows one step commands inconsistently;Follows multi-step commands inconsistently Safety/Judgement: Decreased awareness of safety;Decreased awareness of deficits            Exercises      General Comments General comments (skin integrity, edema, etc.): pt's wife present during session, pt on HFNC throughout session. SpO2 in high 80s to low 90s      Pertinent Vitals/Pain Pain Assessment: No/denies pain    Home Living                      Prior Function            PT Goals (current goals  can now be found in the care plan section) Acute Rehab PT Goals Patient Stated Goal: to go home PT Goal Formulation: With patient/family Time For Goal Achievement: 08/31/17 Potential to Achieve Goals: Good Progress towards PT goals: Progressing toward goals    Frequency    Min 3X/week      PT Plan Current plan remains appropriate    Co-evaluation              AM-PAC PT "6 Clicks" Daily Activity  Outcome Measure  Difficulty turning over in bed (including adjusting bedclothes, sheets and blankets)?: None Difficulty moving from lying on back to sitting on the side of the bed? : None Difficulty  sitting down on and standing up from a chair with arms (e.g., wheelchair, bedside commode, etc,.)?: Unable Help needed moving to and from a bed to chair (including a wheelchair)?: A Little Help needed walking in hospital room?: A Lot Help needed climbing 3-5 steps with a railing? : Total 6 Click Score: 15    End of Session Equipment Utilized During Treatment: Gait belt;Oxygen Activity Tolerance: Patient tolerated treatment well;Other (comment)(limited by decreased pulmonary tolerance) Patient left: in chair;with call bell/phone within reach;with family/visitor present Nurse Communication: Mobility status;Precautions PT Visit Diagnosis: Other abnormalities of gait and mobility (R26.89);Muscle weakness (generalized) (M62.81);History of falling (Z91.81)     Time: 5883-2549 PT Time Calculation (min) (ACUTE ONLY): 17 min  Charges:                       G Codes:       Vic Ripper, SPT   Vic Ripper 08/23/2017, 5:10 PM

## 2017-08-23 NOTE — Progress Notes (Signed)
PULMONARY / CRITICAL CARE MEDICINE   Name: Wayne Torres MRN: [id number] DOB: Feb 10, 1941    ADMISSION DATE:  08/14/2017 CONSULTATION DATE:  08/14/2017  REFERRING MD:  EDP  CHIEF COMPLAINT:  Feels a little better  HISTORY OF PRESENT ILLNESS:   77 y/o male with a history of IPF, systolic heart failure and CAD was admitted with acute on chronic respiratory fialure with hypoxemia after a fall and ankle fracture.  There was concern for a GI bleed on admission but he has not had bleeding since admission.   SUBJECTIVE:  No sig change over weekend.  Looks good.  O2 needs improving slowly.  Feels good.  Wants to go home.    VITAL SIGNS: BP (!) 147/86 (BP Location: Right Arm)   Pulse 67   Temp 97.6 F (36.4 C) (Oral)   Resp (!) 21   Ht 6' (1.829 m)   Wt 95.6 kg (210 lb 12.2 oz)   SpO2 90%   BMI 28.58 kg/m   HEMODYNAMICS:    VENTILATOR SETTINGS: FiO2 (%):  [40 %-50 %] 40 %  INTAKE / OUTPUT:  Intake/Output Summary (Last 24 hours) at 08/23/2017 1015 Last data filed at 08/23/2017 0800 Gross per 24 hour  Intake 0 ml  Output 1105 ml  Net -1105 ml     PHYSICAL EXAMINATION: General: chronically ill appearing male, NAD in bed, pleasant, no c/o  HEENT normocephalic atraumatic no jugular venous distention mucous membranes moist Pulmonary: resps even non labored, few scattered crackles,  Cardiac: Regular rate and rhythm without murmur rub or gallop Abdomen: Soft nontender no organomegaly Extremities: Warm, dry, brisk cap refill, strong pulses. Neuro/psych: Awake alert oriented hard of hearing otherwise intact.    LABS:  BMET Recent Labs  Lab 08/21/17 0844 08/22/17 0456 08/23/17 0344  NA 135 135 136  K 4.0 3.8 4.0  CL 99* 98* 98*  CO2 _0 BUN 27* 33* 37*  CREATININE 1.01 1.24 1.12  GLUCOSE 157* 171* 162*    Electrolytes Recent Labs  Lab 08/21/17 0844 08/22/17 0456 08/23/17 0344  CALCIUM 9.2 9.0 8.8*    CBC Recent Labs  Lab 08/19/17 0834  08/22/17 0456 08/23/17 0344  WBC 9.1 9.1 9.2  HGB 15.8 15.6 15.8  HCT 46.9 46.8 47.4  PLT 206 223 247    Coag's No results for input(s): APTT, INR in the last 168 hours.  Sepsis Markers Recent Labs  Lab 08/17/17 0439  PROCALCITON 0.13    ABG No results for input(s): PHART, PCO2ART, PO2ART in the last 168 hours.  Liver Enzymes No results for input(s): AST, ALT, ALKPHOS, BILITOT, ALBUMIN in the last 168 hours.  Cardiac Enzymes No results for input(s): TROPONINI, PROBNP in the last 168 hours.  Glucose Recent Labs  Lab 08/21/17 1635 08/22/17 0856 08/22/17 1208 08/22/17 1600 08/22/17 2138 08/23/17 0721  GLUCAP 146* 156* 163* 233* 196* 154*    Imaging No results found.   STUDIES:  CT chest : honeycombing and traction bronchiectasis noted, some consolidation in the left upper lobe, increased GGO in the bases  3/25 Left leg ultrasound> negative for DVT CT angio 3/28: 1. An area of non opacification in the distal left upper lobe pulmonary artery branch appears somewhat similar to the study of 2018 and most likely related to underlying scarring. A small subsegmental pulmonary embolus is less likely. Clinical correlation is recommended. 2. Background of emphysema and pulmonary fibrosis/interstitial lung disease. No change in the appearance of the lungs since the CT  of 08/18/2017. 3. Cardiomegaly. 4. Aortic Atherosclerosis \ CULTURES: 3.23 blood > neg  ANTIBIOTICS: vanc 3/23 x1 Zosyn 3/23 > 3/25  SIGNIFICANT EVENTS:   LINES/TUBES:   DISCUSSION: 77 y/o male with a complicated past medical history including IPF, emphysema, pulmonary hypertension, systolic heart failure was admitted with acute on chronic respiratory failure in the setting of an NSTEMI and possible GI bleed.  No bleeding noted since admission, he has had some improved sensation of dyspnea with lasix, but oxygenation remains poor. In general I favor a diagnosis of acute pulmonary edema as the primary  problem on top of his known chronic lung disease.    ASSESSMENT / PLAN:  Acute respiratory failure with hypoxemia in setting of acute pulmonary edema and atelectasis superimposed on IPF -IPF flare seems doubtful -His lower extremity Dopplers were negative for DVT -CT angio: neg for PE. + chronic changes.  Plan S/p 5 days high dose steroids - reduced over weekend to 156m q6h -- will transition to oral today with slow taper over 3 weeks  Continue to wean O2 as able  Mobilize  Pulmonary hygiene  Cont lasix as long as BUN/cr allow - tolerating 480mPO daily   NSTEMI in setting of known CAD Acute decompensated systolic heart failure (EF 35-40%) Pulmonary hypertension due to IPF/chronic lung disease (WHO group 3) Plan:  Continuing conservative measures Continue telemetry  Intermittent Fluid and electrolyte imbalance Plan F/u chem in am    Dark stools, no clear bleeding plan Continue PPI twice daily   Ankle fracture, casted Plan: F/u with ortho  PRN pain rx    Steroid-induced hyperglycemia Plan Sliding scale insulin Reducing steroids     FAMILY  - Updates: pt updated 4/1  - Inter-disciplinary family meet or Palliative Care meeting due by:  Day 7  Improving slowly.  Looks MUCH better than last week. Likely nearing baseline resp status.  Can start d/c planning - will likely be dependent on HH/equipment needs and O2 requirement.   KaNickolas MadridNP 08/23/2017  10:15 AM Pager: [phone] or [phone]

## 2017-08-23 NOTE — Progress Notes (Signed)
PROGRESS NOTE    Wayne FEDORA  Torres:[id number] DOB: 15-Dec-1940 DOA: 08/14/2017 PCP: [name], PA-C    Brief Narrative:  77 year old male who presents with dyspnea and hypotension. He does have a significant past medical history for IPF, chronic hypoxic respiratory failure, systolic heart failure, ischemic cardiomyopathy status post bypass grafting, hypertension, and lung cancer-NSCLC. Patient developed significant weakness, ambulatory dysfunction, he sustained a fall, no loss of consciousness but injured his right ankle (acute fracture). Due to worsening symptoms he was brought into the hospital. On initial physical examination blood pressure was 75/51, his oximetry was 80% on 4 L. He required noninvasive mechanical ventilation. Heart rate 86, temperature is 10.2, respiratory 24. Awake and alert, lungs with bilateral rails, heart S1-S2 present, tachycardic, abdomen soft nontender, no lower extremity edema.   Patient was admitted to hospital with the working diagnosis acute on chronic hypoxic respiratory failure.    Assessment & Plan:   Active Problems:   Acute GI bleeding   CHB (complete heart block) (HCC)   Chronic combined systolic and diastolic CHF (congestive heart failure) (HCC)   Acute on chronic respiratory failure (HCC)   Shock (HCC)   Type 2 myocardial infarction (HCC)   Ankle fracture  1. Acute on chronic hypoxic respiratory failure. Clinically improving, will continue supplemental 02 per high flow nasal cannula, continue oxymetry monitoring. Continue with systemic steroids prednisone 50 mg daily.    2. Heart failure decompensation, Responding well to diuresis, continue telemetry monitoring. Furosemide 40 mg daily.   3. CAD.Marland KitchenChest pain free.   4. Pulmonary HTN. Continue oxymetry monitoring and supplemental 02 per [place] to target 02 saturation above 88%  5. Anxiety and depression. Continue alprazolam and citalopram.   6. T2DM. Will continue glucose cover and  monitoring with insulin sliding scale.   DVT prophylaxis: enoxaparin   Code Status:  dnr  Family Communication:  No family at the bedside Disposition Plan:  Home when clinically improved   Consultants:     Procedures:     Antimicrobials:       Subjective: Patient with improved dyspnea, but not back to baseline, out of bed as tolerated. Tolerating po well, no nausea or vomiting.   Objective: Vitals:   08/23/17 0800 08/23/17 0900 08/23/17 1000 08/23/17 1200  BP: (!) 147/86 (!) 144/85 114/61 136/78  Pulse: 67 83 88 73  Resp: (!) 21 18 (!) 9 20  Temp:    97.9 F (36.6 C)  TempSrc:      SpO2: 90% (!) 89% (!) 87% 91%  Weight:      Height:        Intake/Output Summary (Last 24 hours) at 08/23/2017 1452 Last data filed at 08/23/2017 1200 Gross per 24 hour  Intake 490 ml  Output 1355 ml  Net -865 ml   Filed Weights   08/21/17 0500 08/22/17 0457 08/23/17 0500  Weight: 97.3 kg (214 lb 8.1 oz) 94.6 kg (208 lb 8.9 oz) 95.6 kg (210 lb 12.2 oz)    Examination:   General: Not in pain or dyspnea, deconditioned Neurology: Awake and alert, non focal  E ENT: mild pallor, no icterus, oral mucosa moist Cardiovascular: No JVD. S1-S2 present, rhythmic, no gallops, rubs, or murmurs. No lower extremity edema. Pulmonary: decreased breath sounds bilaterally at bases, decreased air movement, no wheezing, rhonchi or rales. Gastrointestinal. Abdomen with no organomegaly, non tender, no rebound or guarding Skin. No rashes Musculoskeletal: no joint deformities     Data Reviewed: I have personally reviewed following labs  and imaging studies  CBC: Recent Labs  Lab 08/17/17 0439 08/19/17 0834 08/22/17 0456 08/23/17 0344  WBC 8.5 9.1 9.1 9.2  HGB 15.3 15.8 15.6 15.8  HCT 45.3 46.9 46.8 47.4  MCV 97.4 97.7 97.5 97.9  PLT 170 206 223 403   Basic Metabolic Panel: Recent Labs  Lab 08/19/17 0834 08/20/17 0330 08/21/17 0844 08/22/17 0456 08/23/17 0344  NA 136 132* 135 135 136    K 3.9 4.0 4.0 3.8 4.0  CL 99* 94* 99* 98* 98*  CO2 _0 GLUCOSE 212* 190* 157* 171* 162*  BUN 19 24* 27* 33* 37*  CREATININE 1.07 1.28* 1.01 1.24 1.12  CALCIUM 9.2 9.2 9.2 9.0 8.8*   GFR: Estimated Creatinine Clearance: 68.4 mL/min (by C-G formula based on SCr of 1.12 mg/dL). Liver Function Tests: No results for input(s): AST, ALT, ALKPHOS, BILITOT, PROT, ALBUMIN in the last 168 hours. No results for input(s): LIPASE, AMYLASE in the last 168 hours. No results for input(s): AMMONIA in the last 168 hours. Coagulation Profile: No results for input(s): INR, PROTIME in the last 168 hours. Cardiac Enzymes: No results for input(s): CKTOTAL, CKMB, CKMBINDEX, TROPONINI in the last 168 hours. BNP (last 3 results) Recent Labs    06/18/17 0946  PROBNP 476   HbA1C: No results for input(s): HGBA1C in the last 72 hours. CBG: Recent Labs  Lab 08/22/17 1208 08/22/17 1600 08/22/17 2138 08/23/17 0721 08/23/17 1158  GLUCAP 163* 233* 196* 154* 162*   Lipid Profile: No results for input(s): CHOL, HDL, LDLCALC, TRIG, CHOLHDL, LDLDIRECT in the last 72 hours. Thyroid Function Tests: No results for input(s): TSH, T4TOTAL, FREET4, T3FREE, THYROIDAB in the last 72 hours. Anemia Panel: No results for input(s): VITAMINB12, FOLATE, FERRITIN, TIBC, IRON, RETICCTPCT in the last 72 hours.    Radiology Studies: I have reviewed all of the imaging during this hospital visit personally     Scheduled Meds: . citalopram  20 mg Oral Daily  . enoxaparin (LOVENOX) injection  40 mg Subcutaneous Q24H  . furosemide  40 mg Oral Daily  . insulin aspart  0-15 Units Subcutaneous TID WC  . mouth rinse  15 mL Mouth Rinse BID  . Melatonin  6 mg Oral QHS  . pantoprazole  40 mg Oral BID  . potassium chloride  20 mEq Oral BID  . [START ON 08/24/2017] predniSONE  50 mg Oral Q breakfast   Continuous Infusions:   LOS: 8 days        Tymar Polyak Gerome Apley, MD [facility] Pager  [phone]

## 2017-08-24 DIAGNOSIS — S82891D Other fracture of right lower leg, subsequent encounter for closed fracture with routine healing: Secondary | ICD-10-CM

## 2017-08-24 LAB — GLUCOSE, CAPILLARY
GLUCOSE-CAPILLARY: 173 mg/dL — AB (ref 65–99)
Glucose-Capillary: 104 mg/dL — ABNORMAL HIGH (ref 65–99)
Glucose-Capillary: 111 mg/dL — ABNORMAL HIGH (ref 65–99)
Glucose-Capillary: 159 mg/dL — ABNORMAL HIGH (ref 65–99)
Glucose-Capillary: 163 mg/dL — ABNORMAL HIGH (ref 65–99)

## 2017-08-24 LAB — BASIC METABOLIC PANEL
Anion gap: 10 (ref 5–15)
BUN: 34 mg/dL — AB (ref 6–20)
CO2: 30 mmol/L (ref 22–32)
Calcium: 8.9 mg/dL (ref 8.9–10.3)
Chloride: 96 mmol/L — ABNORMAL LOW (ref 101–111)
Creatinine, Ser: 1.18 mg/dL (ref 0.61–1.24)
GFR calc Af Amer: >60 mL/min (ref >60)
GFR, EST NON AFRICAN AMERICAN: 59 mL/min — AB (ref >60)
GLUCOSE: 130 mg/dL — AB (ref 65–99)
POTASSIUM: 4 mmol/L (ref 3.5–5.1)
Sodium: 136 mmol/L (ref 135–145)

## 2017-08-24 NOTE — Progress Notes (Signed)
PULMONARY / CRITICAL CARE MEDICINE   Name: Wayne Torres MRN: [id number] DOB: 07/31/1940    ADMISSION DATE:  08/14/2017 CONSULTATION DATE:  08/14/2017  REFERRING MD:  EDP  CHIEF COMPLAINT:  Feels a little better  HISTORY OF PRESENT ILLNESS:   77 y/o male with a history of IPF, non-compliance with his oxygen at times,  systolic heart failure and CAD was admitted with acute on chronic respiratory failure with hypoxemia after a fall and ankle fracture.  There was concern for a GI bleed on admission but he has not had bleeding since admission.   SUBJECTIVE:  Supine in bed on oxygen at 8 L at rest.   O2 needs improving slowly.  Pt.  Wants to go home, but wife does not want him home until he is ready due to his mobility needs, and his oxygen compliance.    VITAL SIGNS: BP 122/81 (BP Location: Right Arm)   Pulse 67   Temp 97.6 F (36.4 C) (Oral)   Resp 16   Ht 6' (1.829 m)   Wt 204 lb 5.9 oz (92.7 kg)   SpO2 93%   BMI 27.72 kg/m   HEMODYNAMICS:    VENTILATOR SETTINGS:    INTAKE / OUTPUT:  Intake/Output Summary (Last 24 hours) at 08/24/2017 0737 Last data filed at 08/24/2017 0800 Gross per 24 hour  Intake 970 ml  Output 1390 ml  Net -420 ml     PHYSICAL EXAMINATION: General: chronically ill appearing male, supine in bed, NAD HEENT normocephalic atraumatic no JVD,  HOH Pulmonary: Bilateral chest excursion , Crackles per bases Cardiac:S1, S2, RRR, No RMG Abdomen: Obese, soft, non-tender, non-distended Extremities: Warm and dry, Brisk refill to all nailbeds, Right leg in open cast, Good movement  To R and L toes. Neuro/psych: Awake and alert, MAE x 4, Appropriate    LABS:  BMET Recent Labs  Lab 08/22/17 0456 08/23/17 0344 08/24/17 0630  NA 135 136 136  K 3.8 4.0 4.0  CL 98* 98* 96*  CO2 _0 BUN 33* 37* 34*  CREATININE 1.24 1.12 1.18  GLUCOSE 171* 162* 130*    Electrolytes Recent Labs  Lab 08/22/17 0456 08/23/17 0344 08/24/17 0630  CALCIUM  9.0 8.8* 8.9    CBC Recent Labs  Lab 08/19/17 0834 08/22/17 0456 08/23/17 0344  WBC 9.1 9.1 9.2  HGB 15.8 15.6 15.8  HCT 46.9 46.8 47.4  PLT 206 223 247    Coag's No results for input(s): APTT, INR in the last 168 hours.  Sepsis Markers No results for input(s): LATICACIDVEN, PROCALCITON, O2SATVEN in the last 168 hours.  ABG No results for input(s): PHART, PCO2ART, PO2ART in the last 168 hours.  Liver Enzymes No results for input(s): AST, ALT, ALKPHOS, BILITOT, ALBUMIN in the last 168 hours.  Cardiac Enzymes No results for input(s): TROPONINI, PROBNP in the last 168 hours.  Glucose Recent Labs  Lab 08/23/17 0721 08/23/17 1158 08/23/17 1647 08/23/17 2146 08/24/17 0000 08/24/17 0758  GLUCAP 154* 162* 142* 181* 163* 104*    Imaging No results found.   STUDIES:  CT chest : honeycombing and traction bronchiectasis noted, some consolidation in the left upper lobe, increased GGO in the bases  3/25 Left leg ultrasound> negative for DVT CT angio 3/28: 1. An area of non opacification in the distal left upper lobe pulmonary artery branch appears somewhat similar to the study of 2018 and most likely related to underlying scarring. A small subsegmental pulmonary embolus is less likely. Clinical  correlation is recommended. 2. Background of emphysema and pulmonary fibrosis/interstitial lung disease. No change in the appearance of the lungs since the CT of 08/18/2017. 3. Cardiomegaly. 4. Aortic Atherosclerosis \ CULTURES: 3.23 blood > neg  ANTIBIOTICS: vanc 3/23 x1 Zosyn 3/23 > 3/25  SIGNIFICANT EVENTS: Admit 3/23  LINES/TUBES:   DISCUSSION: 77 y/o male with a complicated past medical history including IPF, emphysema, pulmonary hypertension, systolic heart failure was admitted with acute on chronic respiratory failure in the setting of an NSTEMI and possible GI bleed.  No bleeding noted since admission, he has had some improved sensation of dyspnea with lasix, but  oxygenation remains poor. In general I favor a diagnosis of acute pulmonary edema as the primary problem on top of his known chronic lung disease.    ASSESSMENT / PLAN:  Acute respiratory failure with hypoxemia in setting of acute pulmonary edema and atelectasis superimposed on IPF -IPF flare seems doubtful -His lower extremity Dopplers were negative for DVT -CT angio: neg for PE. + chronic changes.  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) Plan:  Continuing conservative measures Continue telemetry Will need cards follow up Continue Lasix as above, watch creatinine  Intermittent Fluid and electrolyte imbalance Plan F/u chem in am  Monitor UP  Replete electrolytes prn   Dark stools, no clear bleeding plan Continue PPI twice daily Monitor CBC/ for Signs of bleeding Transfuse for HGB < 7   Ankle fracture, casted Plan: Frequent vascular checks/ assessment F/u with ortho  PRN pain rx    Steroid-induced hyperglycemia Plan Sliding scale insulin Reducing steroids     FAMILY  - Updates: pt updated 4/2  - Inter-disciplinary family meet or Palliative Care meeting due by:  Day 7  Improving slowly.  Looks MUCH better than last week. Likely nearing baseline resp status.  Can start d/c planning - will likely be dependent on HH/equipment needs and O2 requirement. Has order to step down bed. Patient does not wear his oxygen at home like he should. ( See telephone note dated 04/06/2017)   He is anxious to go home.  His wife is concerned about  Him being sent home too  early. He will need home PT/OT and should consider Pulmonary rehab once out of his cast.. Discussed with patient that pulsed oxygen is no longer an option for him due to his oxygen needs.   SaMagdalen SpatzAGACNP-BC 08/24/2017  9:03 AM Pager: ([phone]

## 2017-08-24 NOTE — Progress Notes (Signed)
PT Cancellation Note  Patient Details Name: ERCOLE GEORG MRN: [id number] DOB: [birth year]   Cancelled Treatment:    Reason Eval/Treat Not Completed: Patient declined, no reason specified, patient refusing to work with PT at this time despite maximal encouragement.   Duncan Dull 08/24/2017, 11:53 AM Alben Deeds, PT DPT  Board Certified Neurologic Specialist [phone]

## 2017-08-24 NOTE — Progress Notes (Signed)
[place] TEAM 1 - Stepdown/ICU TEAM  ELIZABETH HAFF  FBP:[id number] DOB: Apr 23, 1941 DOA: 08/14/2017 PCP: [name], PA-C    Brief Narrative:  77 y/o male with a history of IPF, systolic heart failure, and CAD who was admitted with acute on chronic respiratory fialure with hypoxemia after a fall and ankle fracture.  There was concern for a GI bleed on admission but he has not had bleeding since admission.   Significant Events: 3/23 admit 3/30 [facility] assumed care   Subjective:  Resting comfortably.  No evidence of acute resp distress, or uncontrolled pain.    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) Care per PCCM   NSTEMI - CAD s/p CABG Cards has evaluated - no further inpt w/u planned   Dark stools, no clear bleeding Continue PPI BID - begin lovenox for DVT prophy as pt high risk given broken ankle - Hgb normal/borderline elevated   R Ankle fracture > casted Follow up with Ortho as outpt   Steroid-induced hyperglycemia Sliding scale insulin w/ CBG controlled - no prior hx of DM  DVT prophylaxis: SCDs Code Status: FULL CODE Family Communication: no family present at time of visit  Disposition Plan: SDU  Consultants:  PCCM Cardiology   Antimicrobials:  Vanc 3/23 Zosyn 3/23 > 3/25  Objective: Blood pressure 122/81, pulse (!) 58, temperature 97.6 F (36.4 C), temperature source Oral, resp. rate 19, height 6' (1.829 m), weight 92.7 kg (204 lb 5.9  oz), SpO2 96 %.  Intake/Output Summary (Last 24 hours) at 08/24/2017 1053 Last data filed at 08/24/2017 1000 Gross per 24 hour  Intake 600 ml  Output 1530 ml  Net -930 ml   Filed Weights   08/22/17 0457 08/23/17 0500 08/24/17 0500  Weight: 94.6 kg (208 lb 8.9 oz) 95.6 kg (210 lb 12.2 oz) 92.7 kg (204 lb 5.9 oz)    Examination: General: No acute respiratory distress - resting comfortably  Lungs: Clear to auscultation bilaterally w/ faint diffuse fine crackles  Cardiovascular: RRR Abdomen: ND, soft, BS+ Extremities: trace B LE edema persists   CBC: Recent Labs  Lab 08/19/17 0834 08/22/17 0456 08/23/17 0344  WBC 9.1 9.1 9.2  HGB 15.8 15.6 15.8  HCT 46.9 46.8 47.4  MCV 97.7 97.5 97.9  PLT 206 223 709   Basic Metabolic Panel: Recent Labs  Lab 08/22/17 0456 08/23/17 0344 08/24/17 0630  NA 135 136 136  K 3.8 4.0 4.0  CL 98* 98* 96*  CO2 _0 GLUCOSE 171* 162* 130*  BUN 33* 37* 34*  CREATININE 1.24 1.12 1.18  CALCIUM 9.0 8.8* 8.9   GFR: Estimated Creatinine Clearance: 59.4 mL/min (by C-G formula based on SCr of 1.18 mg/dL).  Liver Function Tests: No results for input(s): AST, ALT, ALKPHOS, BILITOT, PROT, ALBUMIN in the last 168 hours.  Coagulation Profile: No results for input(s): INR, PROTIME in the last 168 hours.  Cardiac Enzymes: No results for  input(s): CKTOTAL, CKMB, CKMBINDEX, TROPONINI in the last 168 hours.  HbA1C: Hgb A1c MFr Bld  Date/Time Value Ref Range Status  02/08/2015 12:11 PM 5.8 4.6 - 6.5 % Final    Comment:    Glycemic Control Guidelines for People with Diabetes:Non Diabetic:  <6%Goal of Therapy: <7%Additional Action Suggested:  >8%     CBG: Recent Labs  Lab 08/23/17 1158 08/23/17 1647 08/23/17 2146 08/24/17 0000 08/24/17 0758  GLUCAP 162* 142* 181* 163* 104*    Recent Results (from the past 240 hour(s))  Culture, blood (routine x 2)     Status: None   Collection Time: 08/14/17 10:10 PM  Result Value Ref Range Status    Specimen Description BLOOD LEFT HAND  Final   Special Requests IN PEDIATRIC BOTTLE Blood Culture adequate volume  Final   Culture   Final    NO GROWTH 5 DAYS Performed at [facility], [place] [address], [place], [place] [address]    Report Status 08/20/2017 FINAL  Final  Culture, blood (routine x 2)     Status: None   Collection Time: 08/14/17 10:20 PM  Result Value Ref Range Status   Specimen Description BLOOD RIGHT ANTECUBITAL  Final   Special Requests   Final    BOTTLES DRAWN AEROBIC AND ANAEROBIC Blood Culture adequate volume   Culture   Final    NO GROWTH 5 DAYS Performed at [facility], [place] [address] [place], [place] [address]    Report Status 08/20/2017 FINAL  Final  MRSA PCR Screening     Status: None   Collection Time: 08/15/17  3:30 AM  Result Value Ref Range Status   MRSA by PCR NEGATIVE NEGATIVE Final    Comment:        The GeneXpert MRSA Assay (FDA approved for NASAL specimens only), is one component of a comprehensive MRSA colonization surveillance program. It is not intended to diagnose MRSA infection nor to guide or monitor treatment for MRSA infections. Performed at [facility], [place] [address] [place], [place] [address]      Scheduled Meds: . citalopram  20 mg Oral Daily  . enoxaparin (LOVENOX) injection  40 mg Subcutaneous Q24H  . furosemide  40 mg Oral Daily  . insulin aspart  0-15 Units Subcutaneous TID WC  . mouth rinse  15 mL Mouth Rinse BID  . Melatonin  6 mg Oral QHS  . pantoprazole  40 mg Oral BID  . predniSONE  50 mg Oral Q breakfast     LOS: 9 days   Cherene Altes, MD [facility] Office  [phone] Pager - Text Page per Amion as per below:  On-Call/Text Page:      [email or web address]      password [email or web address]  If 7PM-7AM, please contact night-coverage [email or web address] Password [facility] 08/24/2017, 10:53 AM

## 2017-08-25 ENCOUNTER — Inpatient Hospital Stay (HOSPITAL_COMMUNITY): Payer: Medicare Other

## 2017-08-25 DIAGNOSIS — J84112 Idiopathic pulmonary fibrosis: Secondary | ICD-10-CM

## 2017-08-25 DIAGNOSIS — I272 Pulmonary hypertension, unspecified: Secondary | ICD-10-CM

## 2017-08-25 DIAGNOSIS — S82891A Other fracture of right lower leg, initial encounter for closed fracture: Secondary | ICD-10-CM

## 2017-08-25 DIAGNOSIS — R739 Hyperglycemia, unspecified: Secondary | ICD-10-CM

## 2017-08-25 LAB — BASIC METABOLIC PANEL
Anion gap: 10 (ref 5–15)
BUN: 37 mg/dL — ABNORMAL HIGH (ref 6–20)
CALCIUM: 8.6 mg/dL — AB (ref 8.9–10.3)
CHLORIDE: 97 mmol/L — AB (ref 101–111)
CO2: 28 mmol/L (ref 22–32)
CREATININE: 1.14 mg/dL (ref 0.61–1.24)
Glucose, Bld: 123 mg/dL — ABNORMAL HIGH (ref 65–99)
Potassium: 3.9 mmol/L (ref 3.5–5.1)
SODIUM: 135 mmol/L (ref 135–145)

## 2017-08-25 LAB — CBC
HCT: 49.9 % (ref 39.0–52.0)
HEMOGLOBIN: 17.1 g/dL — AB (ref 13.0–17.0)
MCH: 33.5 pg (ref 26.0–34.0)
MCHC: 34.3 g/dL (ref 30.0–36.0)
MCV: 97.8 fL (ref 78.0–100.0)
PLATELETS: 268 10*3/uL (ref 150–400)
RBC: 5.1 MIL/uL (ref 4.22–5.81)
RDW: 12.7 % (ref 11.5–15.5)
WBC: 11.5 10*3/uL — ABNORMAL HIGH (ref 4.0–10.5)

## 2017-08-25 LAB — GLUCOSE, CAPILLARY
GLUCOSE-CAPILLARY: 151 mg/dL — AB (ref 65–99)
GLUCOSE-CAPILLARY: 174 mg/dL — AB (ref 65–99)
Glucose-Capillary: 112 mg/dL — ABNORMAL HIGH (ref 65–99)
Glucose-Capillary: 158 mg/dL — ABNORMAL HIGH (ref 65–99)

## 2017-08-25 NOTE — Consult Note (Signed)
Reason for Consult:Ankle fx Referring Physician: [name] is an 77 y.o. male.  HPI: Wayne Torres fell on 3/23 at home. He says this was a mechanical fall though his chart remarks on hx/o frequent falls per wife. He was seen by ortho in [place] and had a right trimal fx. He was splinted but had respiratory issues the following day and was brought to [facility] where he was admitted. He has spent the last 10d or so undergoing intensive medical treatment. He doesn't have any complaints about the ankle per se.  Past Medical History:  Diagnosis Date  . Allergic rhinitis   . Anxiety   . Back pain   . Bipolar disorder ([place])   . Chronic bronchitis ([place]) 02/07/2012  . Complete heart block (HCC)    a. s/p SJM Accent dc ppm, ser # [id number]  . Coronary artery disease s/p CABG 1990s    a. approx 1994 s/p CABG x 4 (LIMA->LAD, VG->RCA, VG->RI, VG->Diag);  b. 11/2008 Cath: LM 100, LAD small, RCA 50-60p, 100d, VG->RCA 100 ost, VG->RI 73m VG->Diag 40 - upper branch of diag small, 99%, LIMA-LAD patent.;  c. 08/2011 Echo: EF 45-50%, mod dil LA,/RA/RV with mild-mod reduced RV fxn.  . Depression   . ED (erectile dysfunction)   . Fatigue   . GERD (gastroesophageal reflux disease)   . GI bleed   . Hip pain, right   . History of kidney stones   . HTN (hypertension)   . Hyperlipidemia   . Lumbar disc disease   . Malignant neoplasm of prostate ([place]   . Osteoarthritis   . Peptic stricture of esophagus   . Pneumonia 1993   Post-op  . Pulmonary fibrosis, postinflammatory ([place]    Stopped Cellcept 02/13/13 d/t cost   . PVD (peripheral vascular disease) ([place]    a. 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  bilat ext iliac dzs.  . Shoulder joint pain     Past Surgical History:  Procedure Laterality Date  . CORONARY ANGIOPLASTY  06/03/2007, 01/25/2009  . CORONARY ARTERY BYPASS GRAFT  20 yrs ago   LIMA to LAD, SVG to PDA, SVG to Ramus, SVG to D  . CYSTOSCOPY     . LEFT AND RIGHT HEART CATHETERIZATION WITH CORONARY ANGIOGRAM N/A 07/05/2012   Procedure: LEFT AND RIGHT HEART CATHETERIZATION WITH CORONARY ANGIOGRAM;  Surgeon: Peter M JMartinique MD;  [facility];  Service: Cardiovascular;  Laterality: N/A;  . NASAL SEPTUM SURGERY  april 2012   dr britt, [facility] [place]  . PACEMAKER PLACEMENT  08/2011  . PERMANENT PACEMAKER INSERTION N/A 09/14/2011   Procedure: PERMANENT PACEMAKER INSERTION;  Surgeon: SDeboraha Sprang MD;  [facility];  Service: Cardiovascular;  Laterality: N/A;  . VIDEO BRONCHOSCOPY WITH ENDOBRONCHIAL ULTRASOUND Left 09/05/2014   Procedure: VIDEO BRONCHOSCOPY WITH ENDOBRONCHIAL ULTRASOUND;  Surgeon: RCollene Gobble MD;  Location: [facility] OR;  Service: Thoracic;  Laterality: Left;    Family History  Problem Relation Age of Onset  . Heart attack Father   . Heart disease Father   . Colon cancer Neg Hx   . Cancer Neg Hx        Colon    Social History:  reports that he quit smoking about 15 years ago. His smoking use included cigarettes. He has a 63.00 pack-year smoking history. He has never used smokeless tobacco. He reports that he does not drink alcohol or use drugs.  Allergies:  Allergies  Allergen Reactions  . Ofev [Nintedanib] Other (See Comments)    Diarrhea  Diarrhea   . Pirfenidone Nausea And Vomiting and Nausea Only    Medications: I have reviewed the patient's current medications.  Results for orders placed or performed during the hospital encounter of 08/14/17 (from the past 48 hour(s))  Glucose, capillary     Status: Abnormal   Collection Time: 08/23/17  4:47 PM  Result Value Ref Range   Glucose-Capillary 142 (H) 65 - 99 mg/dL  Glucose, capillary     Status: Abnormal   Collection Time: 08/23/17  9:46 PM  Result Value Ref Range   Glucose-Capillary 181 (H) 65 - 99 mg/dL  Glucose, capillary     Status: Abnormal   Collection Time: 08/24/17 12:00 AM  Result Value Ref Range   Glucose-Capillary 163 (H) 65 -  99 mg/dL  Basic metabolic panel     Status: Abnormal   Collection Time: 08/24/17  6:30 AM  Result Value Ref Range   Sodium 136 135 - 145 mmol/L   Potassium 4.0 3.5 - 5.1 mmol/L   Chloride 96 (L) 101 - 111 mmol/L   CO2 30 22 - 32 mmol/L   Glucose, Bld 130 (H) 65 - 99 mg/dL   BUN 34 (H) 6 - 20 mg/dL   Creatinine, Ser 1.18 0.61 - 1.24 mg/dL   Calcium 8.9 8.9 - 10.3 mg/dL   GFR calc non Af Amer 59 (L) >60 mL/min   GFR calc Af Amer >60 >60 mL/min    Comment: (NOTE) The eGFR has been calculated using the CKD EPI equation. This calculation has not been validated in all clinical situations. eGFR's persistently <60 mL/min signify possible Chronic Kidney Disease.    Anion gap 10 5 - 15    Comment: Performed at [facility] [address], [place], [place] [address]  Glucose, capillary     Status: Abnormal   Collection Time: 08/24/17  7:58 AM  Result Value Ref Range   Glucose-Capillary 104 (H) 65 - 99 mg/dL  Glucose, capillary     Status: Abnormal   Collection Time: 08/24/17 11:57 AM  Result Value Ref Range   Glucose-Capillary 159 (H) 65 - 99 mg/dL  Glucose, capillary     Status: Abnormal   Collection Time: 08/24/17  4:25 PM  Result Value Ref Range   Glucose-Capillary 111 (H) 65 - 99 mg/dL  Glucose, capillary     Status: Abnormal   Collection Time: 08/24/17  9:44 PM  Result Value Ref Range   Glucose-Capillary 173 (H) 65 - 99 mg/dL  CBC     Status: Abnormal   Collection Time: 08/25/17  2:54 AM  Result Value Ref Range   WBC 11.5 (H) 4.0 - 10.5 K/uL   RBC 5.10 4.22 - 5.81 MIL/uL   Hemoglobin 17.1 (H) 13.0 - 17.0 g/dL   HCT 49.9 39.0 - 52.0 %   MCV 97.8 78.0 - 100.0 fL   MCH 33.5 26.0 - 34.0 pg   MCHC 34.3 30.0 - 36.0 g/dL   RDW 12.7 11.5 - 15.5 %   Platelets 268 150 - 400 K/uL    Comment: Performed at [facility], [place]. [address], [place], [place] [address]  Basic metabolic panel     Status: Abnormal   Collection Time: 08/25/17  2:54 AM  Result Value Ref Range    Sodium 135 135 - 145 mmol/L   Potassium 3.9 3.5 - 5.1 mmol/L  Chloride 97 (L) 101 - 111 mmol/L   CO2 28 22 - 32 mmol/L   Glucose, Bld 123 (H) 65 - 99 mg/dL   BUN 37 (H) 6 - 20 mg/dL   Creatinine, Ser 1.14 0.61 - 1.24 mg/dL   Calcium 8.6 (L) 8.9 - 10.3 mg/dL   GFR calc non Af Amer >60 >60 mL/min   GFR calc Af Amer >60 >60 mL/min    Comment: (NOTE) The eGFR has been calculated using the CKD EPI equation. This calculation has not been validated in all clinical situations. eGFR's persistently <60 mL/min signify possible Chronic Kidney Disease.    Anion gap 10 5 - 15    Comment: Performed at [facility] [address], [place], [place] [address]  Glucose, capillary     Status: Abnormal   Collection Time: 08/25/17  7:58 AM  Result Value Ref Range   Glucose-Capillary 112 (H) 65 - 99 mg/dL  Glucose, capillary     Status: Abnormal   Collection Time: 08/25/17 12:50 PM  Result Value Ref Range   Glucose-Capillary 158 (H) 65 - 99 mg/dL    Dg Chest Port 1 View  Result Date: 08/25/2017 CLINICAL DATA:  Respiratory failure.  Shortness of breath. EXAM: PORTABLE CHEST 1 VIEW COMPARISON:  CTA chest 08/19/2017 FINDINGS: Heart size is exaggerated by low lung volumes. Pacing wires are stable. Chronic left upper lobe airspace disease is again noted. Chronic interstitial disease is stable. No other significant consolidation is present. The heart size and mediastinal contours are within normal limits. Both lungs are clear. The visualized skeletal structures are unremarkable. IMPRESSION: 1. Stable chronic interstitial lung disease. 2. Low lung volumes Electronically Signed   By: San Morelle M.D.   On: 08/25/2017 09:09    Review of Systems  Constitutional: Negative for weight loss.  HENT: Negative for ear discharge, ear pain, hearing loss and tinnitus.   Eyes: Negative for blurred vision, double vision, photophobia and pain.  Respiratory: Positive for shortness of breath. Negative for cough  and sputum production.   Cardiovascular: Negative for chest pain.  Gastrointestinal: Negative for abdominal pain, nausea and vomiting.  Genitourinary: Negative for dysuria, flank pain, frequency and urgency.  Musculoskeletal: Positive for joint pain (Right ankle). Negative for back pain, falls, myalgias and neck pain.  Neurological: Negative for dizziness, tingling, sensory change, focal weakness, loss of consciousness and headaches.  Endo/Heme/Allergies: Does not bruise/bleed easily.  Psychiatric/Behavioral: Negative for depression, memory loss and substance abuse. The patient is not nervous/anxious.    Blood pressure 109/74, pulse (!) 58, temperature 97.6 F (36.4 C), temperature source Oral, resp. rate 20, height 6' (1.829 m), weight 91.9 kg (202 lb 9.6 oz), SpO2 93 %. Physical Exam  Constitutional: He appears well-developed and well-nourished. No distress.  HENT:  Head: Normocephalic and atraumatic.  Eyes: Conjunctivae are normal. Right eye exhibits no discharge. Left eye exhibits no discharge. No scleral icterus.  Neck: Normal range of motion.  Cardiovascular: Normal rate and regular rhythm.  Respiratory: Effort normal. No respiratory distress.  Musculoskeletal:  RLE No traumatic wounds, ecchymosis, or rash  Nontender, lower leg in splint  No knee or ankle effusion  Knee stable to varus/ valgus and anterior/posterior stress  Sens DPN, SPN, TN intact  Motor EHL 5/5  Neurological: He is alert.  Skin: Skin is warm and dry. He is not diaphoretic.  Psychiatric: He has a normal mood and affect. His behavior is normal.    Assessment/Plan: Right trimalleolar ankle fx -- X-rays  from 3/24 show acceptable alignment in the splint. This unstable fx should undergo ORIF but the patient would like to remain under the care of his orthopedist in [place]. I think this is an acceptable plan though if he remains hospitalized into next week we may need to revisit. Should he change his mind please  let me know. I told him we'd be happy to fix this for him. He should remain NWB in his splint.    Lisette Abu, PA-C Orthopedic Surgery [phone] 08/25/2017, 2:48 PM

## 2017-08-25 NOTE — Care Management Note (Signed)
Case Management Note  Patient Details  Name: Wayne Torres MRN: [id number] Date of Birth: December 28, 1940  Subjective/Objective:                    Action/Plan: Spoke to daughter Wayne Torres [phone].  Wayne Torres has concerns regarding her fathers care and would like to speak to MD.   Wayne Torres stated her father has a partial cast and kelix on her foot, per daughter no one has checked her fathers skin. Daughter is requesting an orthopedic consult before her father is discharged from the hospital .   Patient currently has home oxygen through [place]. Wayne Torres wants home oxygen changed to [place] . Explained [facility] will have to pick up their equipment , CM will request new order for home oxygen , qualifying saturation note .  Daughter aware and asks NCM give [facility] her mothers number to arrange pick up of equipment [name] [phone]) and for delivery of home oxygen through [facility].Wayne Torres has picked up a wheelchair from [place] . She would like a walker and 3 in 1 ordered through [place] and delivered to her fathers hospital room. She is also requesting home health RN,PT, and an aide. Family will transport her dad home at the time of discharge. Daughter is a Marine scientist and plans to take FMLA to help her mother care for her father. Daughter requesting to speak to MD.  NCM explained to daughter I will page MD explain her concerns and requests and ask him to call her.   Text paged MD awaiting call back.    Expected Discharge Date:                  Expected Discharge Plan:  [place]  In-House Referral:  NA  Discharge planning Services  CM Consult  Post Acute Care Choice:  Durable Medical Equipment Choice offered to:  Adult Children  DME Arranged:  3-N-1, Walker rolling, Oxygen DME Agency:  [place] Arranged:    [place] Agency:  [place]  Status of Service:  In process, will continue to  follow  If discussed at Long Length of Stay Meetings, dates discussed:    Additional Comments:  [name], RN 08/25/2017, 1:46 PM

## 2017-08-25 NOTE — Progress Notes (Signed)
PT Cancellation Note  Patient Details Name: MERIT MAYBEE MRN: [id number] DOB: June 25, 1940   Cancelled Treatment:    Reason Eval/Treat Not Completed: Other (comment) attempted to see x2, patient initially refusing due to lunch, and then patient had requested to get back to bed.    Duncan Dull 08/25/2017, 2:49 PM Alben Deeds, PT DPT  Board Certified Neurologic Specialist [phone]

## 2017-08-25 NOTE — Progress Notes (Signed)
OT Cancellation Note  Patient Details Name: Wayne Torres MRN: [id number] DOB: 06-30-1940   Cancelled Treatment:    Reason Eval/Treat Not Completed: Patient declined, no reason specified(Will follow.)  [name] 08/25/2017, 2:01 PM

## 2017-08-25 NOTE — Progress Notes (Signed)
PROGRESS NOTE    Wayne Torres  TGY:[id number] DOB: 21-Apr-1941 DOA: 08/14/2017 PCP: [name], PA-C   Brief Narrative:  77 y/o WM PMHx  IPF, systolic heart failure, and CAD   Admitted with acute on chronic respiratory fialure with hypoxemia after a fall and ankle fracture.  There was concern for a GI bleed on admission but he has not had bleeding since admission.     Subjective: 4/3 A/O x4, negative CP, negative RLE pain, states at home uses 3 1/2 L to 4 L O2 via [place].  Stated he fell secondary to not using his O2 while exerting himself.  States initially orthopedic surgeon who saw him in [place] wanted to pin his ankle but he decided against that and they just placed a cast.  States cast is been on RIGHT lower extremity slightly> weeks.  Sees Dr. Geralyn Flash CCM for IPF.  Review of EMR reveals that patient fractured right ankle on 3/21 was seen by Dr. Lenn Sink health:PLAN: Placed in a well-padded short leg Cadillac type splint, with an mold placed by Dr. Smitty Cords. He was instructed on strict elevation of the right lower extremity over these next several days. Prescription for wheelchair provided. He has pain medications as provided by the emergency department, and declined further prescriptions. He will see Dr. Tawni Millers next week for skin check, and tentatively scheduled for open reduction with internal fixation of the right ankle fracture by Dr. Tawni Millers next Friday. He will be scheduled to see surgical wellness in the interim for medical clearance/optimization. Unfortunately patient wound up hospitalized here before plan could be effectively implemented     Assessment & Plan:   Active Problems:   Acute GI bleeding   CHB (complete heart block) (HCC)   Chronic combined systolic and diastolic CHF (congestive heart failure) (HCC)   Acute on chronic respiratory failure (HCC)   Shock ([place])   Type 2 myocardial infarction (HCC)   Ankle fracture  Acute on Chronic  Respiratory failure with Hypoxia (patient's baseline O2 demand 3.5-4 L O2 via [place]) -Multifactorial acute pulmonary edema, atelectasis, IPF, COPD. -Dopplers negative for DVT: CT angiogram negative for PE - Prednisone 50 mg daily.  We will slowly taper - Titrate O2 to maintain SPO2 88-92% -Patient currently on 6 L O2 via [place], which is higher than his normal range.  Chronic systolic and diastolic CHF (EF 28-76%) decompensated.  Base weight? 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 Hilbert Odor orthopedic surgery who recommended patient allow surgery however patient declined stating he wanted to stay with his orthopedic surgeon in [place].    Hyperglycemia -Iatrogenic secondary to steroid - Moderate SSI     DVT prophylaxis: Lovenox Code Status: Full Family Communication: None Disposition Plan: TBD   Consultants:  P CCM Cardiology   Procedures/Significant Events:  3/23 admit 3/24 DG RIGHT ankle:-Oblique fracture of the fibula suggesting an eversion injury. -  Minimally displaced posterior tibial fracture.- Probable fracture at the medial malleolus. - Widening of the medial tibiotalar space. 3/30 [facility] assumed care     I have personally reviewed and interpreted all radiology studies and my findings are as above.  VENTILATOR SETTINGS:    Cultures   Antimicrobials: Anti-infectives (From admission, onward)   Start     Stop   08/16/17 0000   vancomycin (VANCOCIN) 1,250 mg in sodium chloride 0.9 % 250 mL IVPB  Status:  Discontinued     08/15/17 0954   08/15/17 0800  piperacillin-tazobactam (ZOSYN) IVPB 3.375 g  Status:  Discontinued     08/16/17 1353   08/15/17 0145  vancomycin (VANCOCIN) 2,000 mg in sodium chloride 0.9 % 500 mL IVPB     08/15/17 0454   08/15/17 0145  piperacillin-tazobactam (ZOSYN) IVPB 3.375 g     08/15/17 0245       Devices    LINES / TUBES:      Continuous Infusions:   Objective: Vitals:   08/25/17 1200 08/25/17 1210 08/25/17 1223 08/25/17 1300  BP:  109/74    Pulse: 66 61  (!) 58  Resp: _0 Temp:   97.6 F (36.4 C)   TempSrc:   Oral   SpO2: 93% 93%  93%  Weight:      Height:        Intake/Output Summary (Last 24 hours) at 08/25/2017 0752 Last data filed at 08/25/2017 0700 Gross per 24 hour  Intake 720 ml  Output 1630 ml  Net -910 ml   Filed Weights   08/23/17 0500 08/24/17 0500 08/25/17 0327  Weight: 210 lb 12.2 oz (95.6 kg) 204 lb 5.9 oz (92.7 kg) 202 lb 9.6 oz (91.9 kg)    Examination:  General: A/O x4, positive acute on chronic respiratory distress Neck:  Negative scars, masses, torticollis, lymphadenopathy, JVD Lungs: diffuse poor air movement throughout, negative  wheezes or crackles Cardiovascular: Regular rate and rhythm without murmur gallop or rub normal S1 and S2 Abdomen: negative abdominal pain, nondistended, positive soft, bowel sounds, no rebound, no ascites, no appreciable mass Extremities: positive RLE in a cast, toes appear cyanotic but good DP pulse, good capillary refill to all metatarsals  Skin: Negative rashes, lesions, ulcers Psychiatric:  Negative depression, negative anxiety, negative fatigue, negative mania  Central nervous system:  Cranial nerves II through XII intact, tongue/uvula midline, all extremities muscle strength 5/5, sensation intact throughout, negative dysarthria, negative expressive aphasia, negative receptive aphasia.  .      Data Reviewed: Care during the described time interval was provided by me .  I have reviewed this patient's available data, including medical history, events of note, physical examination, and all test results as part of my evaluation.   CBC: Recent Labs  Lab 08/19/17 0834 08/22/17 0456 08/23/17 0344 08/25/17 0254  WBC 9.1 9.1 9.2 11.5*  HGB 15.8 15.6 15.8 17.1*  HCT 46.9 46.8 47.4 49.9  MCV 97.7 97.5 97.9 97.8  PLT 206 223 247 882   Basic Metabolic Panel: Recent Labs  Lab 08/21/17 0844 08/22/17 0456 08/23/17 0344 08/24/17 0630 08/25/17 0254  NA 135 135 136 136 135  K 4.0 3.8 4.0 4.0 3.9  CL 99* 98* 98* 96* 97*  CO2 _1 GLUCOSE 157* 171* 162* 130* 123*  BUN 27* 33* 37* 34* 37*  CREATININE 1.01 1.24 1.12 1.18 1.14  CALCIUM 9.2 9.0 8.8* 8.9 8.6*   GFR: Estimated Creatinine Clearance: 61.5  mL/min (by C-G formula based on SCr of 1.14 mg/dL). Liver Function Tests: No results for input(s): AST, ALT, ALKPHOS, BILITOT, PROT, ALBUMIN in the last 168 hours. No results for input(s): LIPASE, AMYLASE in the last 168 hours. No results for input(s): AMMONIA in the last 168 hours. Coagulation Profile: No results for input(s): INR, PROTIME in the last 168 hours. Cardiac Enzymes: No results for input(s): CKTOTAL, CKMB, CKMBINDEX, TROPONINI in the last 168 hours. BNP (last 3 results) Recent Labs    06/18/17 0946  PROBNP 476   HbA1C: No results for input(s): HGBA1C in the last 72 hours. CBG: Recent Labs  Lab 08/24/17 0000 08/24/17 0758 08/24/17 1157 08/24/17 1625 08/24/17 2144  GLUCAP 163* 104* 159* 111* 173*   Lipid Profile: No results for input(s): CHOL, HDL, LDLCALC, TRIG, CHOLHDL, LDLDIRECT in the last 72 hours. Thyroid Function Tests: No results for input(s): TSH, T4TOTAL, FREET4, T3FREE, THYROIDAB in the last 72 hours. Anemia Panel: No results for input(s): VITAMINB12, FOLATE, FERRITIN, TIBC, IRON, RETICCTPCT in the last 72 hours. Urine  analysis:    Component Value Date/Time   COLORURINE AMBER (A) 08/14/2017 2158   APPEARANCEUR HAZY (A) 08/14/2017 2158   LABSPEC 1.025 08/14/2017 2158   PHURINE 5.0 08/14/2017 2158   GLUCOSEU NEGATIVE 08/14/2017 2158   GLUCOSEU NEGATIVE 02/08/2015 1211   HGBUR SMALL (A) 08/14/2017 2158   BILIRUBINUR NEGATIVE 08/14/2017 2158   [place] NEGATIVE 08/14/2017 2158   PROTEINUR 30 (A) 08/14/2017 2158   UROBILINOGEN 1.0 02/08/2015 1211   NITRITE NEGATIVE 08/14/2017 2158   LEUKOCYTESUR NEGATIVE 08/14/2017 2158   Sepsis Labs: _0 (procalcitonin:4,lacticidven:4)  )No results found for this or any previous visit (from the past 240 hour(s)).       Radiology Studies: No results found.      Scheduled Meds: . citalopram  20 mg Oral Daily  . enoxaparin (LOVENOX) injection  40 mg Subcutaneous Q24H  . furosemide  40 mg Oral Daily  . insulin aspart  0-15 Units Subcutaneous TID WC  . mouth rinse  15 mL Mouth Rinse BID  . Melatonin  6 mg Oral QHS  . pantoprazole  40 mg Oral BID  . predniSONE  50 mg Oral Q breakfast   Continuous Infusions:   LOS: 10 days    Time spent: 40 minutes    Teckla Christiansen, Geraldo Docker, MD [facility] Pager [phone]   If 7PM-7AM, please contact night-coverage [email or web address] Password [email or web address] 08/25/2017, 7:52 AM

## 2017-08-25 NOTE — Evaluation (Signed)
Occupational Therapy Evaluation Patient Details Name: Wayne Torres MRN: [id number] DOB: 1940/06/09 Today's Date: 08/25/2017    History of Present Illness Pt is a 77 y.o. male admitted for fall with associated right ankle fracture. He was found to be have septic shock and hypoxia. Pt has a history of multiple recent falls. PMH: IPF, systolic CHF, CAD s/p CABG, HTN, PVD, and HLD.   Clinical Impression   Pt reports being independent in self care prior to admission and using 4-6L 02. He admits he should have been using his oxygen more often. Pt demonstrates difficulty maintaining NWB on R LE and is impulsive with mobility. He is a high risk for falls. Pt currently requires min assist for mobility and ADL. Will follow acutely. Pt is very eager to go home.    Follow Up Recommendations  Home health OT;Supervision/Assistance - 24 hour    Equipment Recommendations  None recommended by OT    Recommendations for Other Services       Precautions / Restrictions Precautions Precautions: Fall Restrictions Weight Bearing Restrictions: Yes RLE Weight Bearing: Non weight bearing      Mobility Bed Mobility Overal bed mobility: Needs Assistance Bed Mobility: Sit to Supine       Sit to supine: Supervision   General bed mobility comments: supervision for safety  Transfers   Equipment used: Rolling walker (2 wheeled) Transfers: Sit to/from [facility] Sit to Stand: Min guard Stand pivot transfers: Min assist       General transfer comment: pt moves impulsively, cues for technique and to maintain NWB on R LE    Balance Overall balance assessment: Needs assistance   Sitting balance-Leahy Scale: Good     Standing balance support: During functional activity;Bilateral upper extremity supported Standing balance-Leahy Scale: Poor Standing balance comment: requires bil UE on RW for support                           ADL either performed or assessed with  clinical judgement   ADL Overall ADL's : Needs assistance/impaired Eating/Feeding: Independent;Sitting   Grooming: Wash/dry hands;Wash/dry face;Sitting;Brushing hair;Set up   Upper Body Bathing: Minimal assistance;Sitting Upper Body Bathing Details (indicate cue type and reason): assist for back Lower Body Bathing: Minimal assistance;Sit to/from stand Lower Body Bathing Details (indicate cue type and reason): steadying assist in standing Upper Body Dressing : Set up;Sitting   Lower Body Dressing: Minimal assistance;Sit to/from stand   Toilet Transfer: Min guard;Stand-pivot;BSC   Toileting- Water quality scientist and Hygiene: Minimal assistance;Sit to/from stand               Vision Baseline Vision/History: Macular Degeneration(in L eye) Patient Visual Report: No change from baseline       Perception     Praxis      Pertinent Vitals/Pain Pain Assessment: No/denies pain     Hand Dominance Right   Extremity/Trunk Assessment Upper Extremity Assessment Upper Extremity Assessment: Overall WFL for tasks assessed   Lower Extremity Assessment RLE Deficits / Details: R ankle fx   Cervical / Trunk Assessment Cervical / Trunk Assessment: Normal   Communication Communication Communication: HOH   Cognition Arousal/Alertness: Awake/alert Behavior During Therapy: Impulsive Overall Cognitive Status: Impaired/Different from baseline Area of Impairment: Safety/judgement                         Safety/Judgement: Decreased awareness of safety;Decreased awareness of deficits     General Comments: pt  with difficulty maintaining NWB on R LE   General Comments       Exercises     Shoulder Instructions      Home Living Family/patient expects to be discharged to:: Private residence Living Arrangements: Spouse/significant other Available Help at Discharge: Family Type of Home: House Home Access: Stairs to enter Technical brewer of Steps: 2 Entrance  Stairs-Rails: None Home Layout: One level     Bathroom Shower/Tub: Occupational psychologist: Handicapped height     Home Equipment: Environmental consultant - 2 wheels;Cane - single point;Bedside commode;Shower seat;Tub bench          Prior Functioning/Environment Level of Independence: Independent        Comments: on home oxygen (4L?). per wife, doesn't always wear it        OT Problem List: Decreased strength;Decreased activity tolerance;Impaired balance (sitting and/or standing);Decreased cognition;Decreased knowledge of use of DME or AE;Decreased knowledge of precautions;Cardiopulmonary status limiting activity      OT Treatment/Interventions: Self-care/ADL training;DME and/or AE instruction;Therapeutic activities;Balance training;Patient/family education    OT Goals(Current goals can be found in the care plan section) Acute Rehab OT Goals Patient Stated Goal: to go home OT Goal Formulation: With patient Time For Goal Achievement: 09/08/17 Potential to Achieve Goals: Good  OT Frequency: Min 2X/week   Barriers to D/C:            Co-evaluation              AM-PAC PT "6 Clicks" Daily Activity     Outcome Measure Help from another person eating meals?: None Help from another person taking care of personal grooming?: A Little Help from another person toileting, which includes using toliet, bedpan, or urinal?: A Little Help from another person bathing (including washing, rinsing, drying)?: A Little Help from another person to put on and taking off regular upper body clothing?: None Help from another person to put on and taking off regular lower body clothing?: A Little 6 Click Score: 20   End of Session Equipment Utilized During Treatment: Gait belt;Rolling walker;Oxygen(6L) Nurse Communication: Mobility status  Activity Tolerance: Patient tolerated treatment well Patient left: in bed;with call bell/phone within reach;with bed alarm set  OT Visit Diagnosis:  Unsteadiness on feet (R26.81);Other symptoms and signs involving cognitive function;Muscle weakness (generalized) (M62.81);History of falling (Z91.81)                Time: 0149-9692 OT Time Calculation (min): 20 min Charges:  OT General Charges $OT Visit: 1 Visit OT Evaluation $OT Eval Moderate Complexity: 1 Mod G-Codes:     [name] 08/25/2017, 2:58 PM  08/25/2017 Nestor Lewandowsky, OTR/L Pager: [phone]

## 2017-08-25 NOTE — Progress Notes (Signed)
Nutrition Brief Note  Patient identified for LOS (10 days).  Wt Readings from Last 15 Encounters:  08/25/17 202 lb 9.6 oz (91.9 kg)  06/18/17 210 lb 1.9 oz (95.3 kg)  04/19/17 208 lb 6 oz (94.5 kg)  04/14/17 210 lb (95.3 kg)  04/07/17 210 lb (95.3 kg)  04/05/17 208 lb 12.8 oz (94.7 kg)  12/31/16 209 lb (94.8 kg)  09/25/16 209 lb 12.8 oz (95.2 kg)  09/21/16 208 lb (94.3 kg)  04/29/16 215 lb (97.5 kg)  03/24/16 206 lb 3.2 oz (93.5 kg)  01/07/16 215 lb (97.5 kg)  09/24/15 220 lb 6.4 oz (100 kg)  09/12/15 220 lb (99.8 kg)  06/21/15 231 lb (104.8 kg)    Body mass index is 27.48 kg/m. Patient meets criteria for overweight based on current BMI. Per review, pt has lost 8 lbs (3.8% body weight) in the past 2-2.5 months; this is not significant for time frame. He was admitted for acute on chronic respiratory failure with hypoxemia after having a fall and ankle fx. Pt reports good appetite but he does not like the food here. He reports that breakfast has been the worst tasting meal. He has recently been having someone bring items in for meals. He denies abdominal pain, nausea, chewing difficulty, swallowing difficulty, or the sensation of items getting stuck in his throat when eating or drinking.   Current diet order is Heart Healthy, patient is consuming approximately 85-100% of meals at this time.  Medications reviewed; 40 oral Lasix/day, sliding scale Novolog, 40 mg oral Protonix BID, 50 mg Deltasone/day.  Labs reviewed; CBG: 112 mg/dL this AM, Cl: 97 mmol/L, BUN: 37 mg/dL, Ca: 8.6 mg/dL.   No nutrition interventions warranted at this time. If nutrition issues arise, please consult RD.      Jarome Matin, MS, RD, LDN, [facility] Inpatient Clinical Dietitian Pager # [phone] After hours/weekend pager # [phone]

## 2017-08-26 DIAGNOSIS — I248 Other forms of acute ischemic heart disease: Secondary | ICD-10-CM

## 2017-08-26 LAB — CBC
HEMATOCRIT: 51.4 % (ref 39.0–52.0)
Hemoglobin: 16.9 g/dL (ref 13.0–17.0)
MCH: 32.4 pg (ref 26.0–34.0)
MCHC: 32.9 g/dL (ref 30.0–36.0)
MCV: 98.7 fL (ref 78.0–100.0)
PLATELETS: 290 10*3/uL (ref 150–400)
RBC: 5.21 MIL/uL (ref 4.22–5.81)
RDW: 13 % (ref 11.5–15.5)
WBC: 14 10*3/uL — AB (ref 4.0–10.5)

## 2017-08-26 LAB — BASIC METABOLIC PANEL
Anion gap: 9 (ref 5–15)
BUN: 35 mg/dL — ABNORMAL HIGH (ref 6–20)
CALCIUM: 8.7 mg/dL — AB (ref 8.9–10.3)
CO2: 30 mmol/L (ref 22–32)
Chloride: 97 mmol/L — ABNORMAL LOW (ref 101–111)
Creatinine, Ser: 1.13 mg/dL (ref 0.61–1.24)
GFR calc Af Amer: >60 mL/min (ref >60)
Glucose, Bld: 107 mg/dL — ABNORMAL HIGH (ref 65–99)
POTASSIUM: 4 mmol/L (ref 3.5–5.1)
SODIUM: 136 mmol/L (ref 135–145)

## 2017-08-26 LAB — MAGNESIUM: MAGNESIUM: 2.1 mg/dL (ref 1.7–2.4)

## 2017-08-26 LAB — GLUCOSE, CAPILLARY
GLUCOSE-CAPILLARY: 88 mg/dL (ref 65–99)
GLUCOSE-CAPILLARY: 129 mg/dL — AB (ref 65–99)
Glucose-Capillary: 176 mg/dL — ABNORMAL HIGH (ref 65–99)
Glucose-Capillary: 169 mg/dL — ABNORMAL HIGH (ref 65–99)

## 2017-08-26 NOTE — Progress Notes (Signed)
PROGRESS NOTE    Wayne Torres  ASN:[id number] DOB: 09-29-1940 DOA: 08/14/2017 PCP: [name], PA-C   Brief Narrative:  77 y/o WM PMHx  IPF, systolic heart failure, and CAD   Admitted with acute on chronic respiratory fialure with hypoxemia after a fall and ankle fracture.  There was concern for a GI bleed on admission but he has not had bleeding since admission.   Review of EMR reveals that patient fractured right ankle on 3/21 was seen by Dr. Lenn Sink health:PLAN: Placed in a well-padded short leg Cadillac type splint, with an mold placed by Dr. Smitty Cords. He was instructed on strict elevation of the right lower extremity over these next several days. Prescription for wheelchair provided. He has pain medications as provided by the emergency department, and declined further prescriptions. He will see Dr. Tawni Millers next week for skin check, and tentatively scheduled for open reduction with internal fixation of the right ankle fracture by Dr. Tawni Millers next Friday. He will be scheduled to see surgical wellness in the interim for medical clearance/optimization. Unfortunately patient wound up hospitalized here before plan could be effectively implemented   Subjective: 4/4 A/O x4, negative CP, negative abdominal pain.  Positive continued acute on chronic S OB.  Currently on 5 L O2 via [place].  After lengthy discussion with patient and wife they do not want to proceed with repair of RIGHT ankle fracture at [facility].        Assessment & Plan:   Active Problems:   Acute GI bleeding   CHB (complete heart block) (HCC)   Chronic combined systolic and diastolic CHF (congestive heart failure) (HCC)   Acute on chronic respiratory failure (HCC)   Shock ([place])   Type 2 myocardial infarction (HCC)   Ankle fracture  Acute on Chronic Respiratory failure with Hypoxia (patient's baseline O2 demand 3.5-4 L O2 via [place]) -Multifactorial acute pulmonary edema, atelectasis, IPF, COPD. -Dopplers  negative for DVT: CT angiogram negative for PE - Prednisone 50 mg daily.  Slowly taper over 3 weeks.  Patient request reassignment to pulmonologist at [facility].  Will attempt to facilitate prior to discharge.  Will also inform patient and wife to cancel their follow-up on 4/12 NP [facility] pulmonology, and Dr. Brand Males on 5/7. - Titrate O2 to maintain SPO2 88 - 92% -Patient currently on 5 L O2 via [place], which is slightly higher than his home O2 regimen. -Spoke at length with patient and wife understand they will not be able to obtain cardiopulmonary rehab until RIGHT unstable ankle fracture repaired and released by orthopedic surgeon.  Chronic systolic and Diastolic CHF (EF 19-37%) decompensated.  Base weight? -Strict in and out since admission -13.7 L -Daily weight Filed Weights   08/23/17 0500 08/24/17 0500 08/25/17 0327  Weight: 210 lb 12.2 oz (95.6 kg) 204 lb 5.9 oz (92.7 kg) 202 lb 9.6 oz (91.9 kg)  -Lasix 40 mg daily per cardiology recommendation.  We will continue diuresis. - Schedule follow-up 4 weeks with Dr. 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 - Continue conservative treatment: No plans for cardiac catheterization   Dark stools no clear bleeding -Protonix 40 mg  BID   RIGHT Ankle Fracture - Unstable fracture consulted Dr. Hilbert Odor orthopedic surgery who recommended patient allow surgery however patient declined stating he wanted to stay with his orthopedic surgeon in [place].  - Cardiology recommends regional anesthesia if possible for ankle surgery to reduce cardiac risk    Hyperglycemia -Iatrogenic secondary to steroid - Moderate SSI     DVT prophylaxis: Lovenox Code Status: Full Family Communication: None Disposition Plan: TBD   Consultants:  PCCM Cardiology   Procedures/Significant Events:  3/23 admit 3/24 DG RIGHT ankle:-Oblique fracture of the fibula suggesting an eversion injury. - Minimally displaced posterior tibial fracture.- Probable fracture at the medial malleolus. - Widening of the medial tibiotalar space. 3/25 Echocardiogram: LVEF= 35% to 40%. Severe hypokinesis of the inferolateral and inferior myocardium; consistent with ischemia in the distribution of the right coronary or left circumflex coronary artery.  - Ventricular septum: Septal motion showed paradox. These changes are consistent with right ventricular pacing. - Right ventricle: severely dilated. Wall - Pulmonary arteries: PA peak pressure: 52 mm Hg (S), assuming normal RA pressure of 3   mm Hg. 3/30 TRH assumed care     I have personally reviewed and interpreted all radiology studies and my findings are as above.  VENTILATOR SETTINGS:    Cultures   Antimicrobials: Anti-infectives (From admission, onward)   Start     Stop   08/16/17 0000  vancomycin (VANCOCIN) 1,250 mg in sodium chloride 0.9 % 250 mL IVPB  Status:  Discontinued     08/15/17 0954   08/15/17 0800  piperacillin-tazobactam (ZOSYN) IVPB 3.375 g  Status:  Discontinued     08/16/17 1353   08/15/17 0145  vancomycin (VANCOCIN) 2,000 mg in sodium chloride 0.9 % 500 mL IVPB     08/15/17 0454   08/15/17 0145  piperacillin-tazobactam (ZOSYN) IVPB 3.375 g     08/15/17 0245       Devices    LINES / TUBES:      Continuous Infusions:   Objective: Vitals:   08/25/17 2001 08/26/17 0022 08/26/17 0418 08/26/17 0800  BP: 123/75 126/76 122/83   Pulse: 69 70 (!) 54   Resp: (!) _0 Temp: 97.7 F (36.5 C) 97.6 F (36.4 C) 97.7 F (36.5 C) 98.2 F (36.8 C)  TempSrc: Oral Oral Oral Oral   SpO2: 90% 93% 93%   Weight:      Height:        Intake/Output Summary (Last 24 hours) at 08/26/2017 9166 Last data filed at 08/25/2017 1707 Gross per 24 hour  Intake 360 ml  Output 460 ml  Net -100 ml   Filed Weights   08/23/17 0500 08/24/17 0500 08/25/17 0327  Weight: 210 lb 12.2 oz (95.6 kg) 204 lb 5.9 oz (92.7 kg) 202 lb 9.6 oz (91.9 kg)    Physical Exam:  General: A/O x4, positive acute on chronic respiratory distress (improving) Neck:  Negative scars, masses, torticollis, lymphadenopathy, JVD Lungs: diffuse poor air movement throughout, negative  wheezes or crackles Cardiovascular: Regular rate and rhythm without murmur gallop or rub normal S1 and S2 Abdomen: negative abdominal pain, nondistended, positive soft, bowel sounds, no rebound, no ascites, no appreciable mass Extremities: positive RLE in a cast, toes appear cyanotic but good DP pulse, good capillary refill to all metatarsals  Skin: Negative rashes, lesions, ulcers Psychiatric:  Negative depression, negative anxiety, negative fatigue, negative mania  Central nervous system:  Cranial nerves II through XII intact, tongue/uvula midline, all extremities muscle  strength 5/5, sensation intact throughout, negative dysarthria, negative expressive aphasia, negative receptive aphasia.   .     Data Reviewed: Care during the described time interval was provided by me .  I have reviewed this patient's available data, including medical history, events of note, physical examination, and all test results as part of my evaluation.   CBC: Recent Labs  Lab 08/22/17 0456 08/23/17 0344 08/25/17 0254 08/26/17 0411  WBC 9.1 9.2 11.5* 14.0*  HGB 15.6 15.8 17.1* 16.9  HCT 46.8 47.4 49.9 51.4  MCV 97.5 97.9 97.8 98.7  PLT 223 247 268 606   Basic Metabolic Panel: Recent Labs  Lab 08/22/17 0456 08/23/17 0344 08/24/17 0630 08/25/17 0254 08/26/17 0411  NA 135 136 136 135 136  K 3.8 4.0 4.0 3.9 4.0  CL 98* 98* 96* 97* 97*   CO2 _0 GLUCOSE 171* 162* 130* 123* 107*  BUN 33* 37* 34* 37* 35*  CREATININE 1.24 1.12 1.18 1.14 1.13  CALCIUM 9.0 8.8* 8.9 8.6* 8.7*  MG  --   --   --   --  2.1   GFR: Estimated Creatinine Clearance: 62 mL/min (by C-G formula based on SCr of 1.13 mg/dL). Liver Function Tests: No results for input(s): AST, ALT, ALKPHOS, BILITOT, PROT, ALBUMIN in the last 168 hours. No results for input(s): LIPASE, AMYLASE in the last 168 hours. No results for input(s): AMMONIA in the last 168 hours. Coagulation Profile: No results for input(s): INR, PROTIME in the last 168 hours. Cardiac Enzymes: No results for input(s): CKTOTAL, CKMB, CKMBINDEX, TROPONINI in the last 168 hours. BNP (last 3 results) Recent Labs    06/18/17 0946  PROBNP 476   HbA1C: No results for input(s): HGBA1C in the last 72 hours. CBG: Recent Labs  Lab 08/25/17 0758 08/25/17 1250 08/25/17 1753 08/25/17 2209 08/26/17 0758  GLUCAP 112* 158* 151* 174* 88   Lipid Profile: No results for input(s): CHOL, HDL, LDLCALC, TRIG, CHOLHDL, LDLDIRECT in the last 72 hours. Thyroid Function Tests: No results for input(s): TSH, T4TOTAL, FREET4, T3FREE, THYROIDAB in the last 72 hours. Anemia Panel: No results for input(s): VITAMINB12, FOLATE, FERRITIN, TIBC, IRON, RETICCTPCT in the last 72 hours. Urine analysis:    Component Value Date/Time   COLORURINE AMBER (A) 08/14/2017 2158   APPEARANCEUR HAZY (A) 08/14/2017 2158   LABSPEC 1.025 08/14/2017 2158   PHURINE 5.0 08/14/2017 2158   GLUCOSEU NEGATIVE 08/14/2017 2158   GLUCOSEU NEGATIVE 02/08/2015 1211   HGBUR SMALL (A) 08/14/2017 2158   BILIRUBINUR NEGATIVE 08/14/2017 2158   [place] NEGATIVE 08/14/2017 2158   PROTEINUR 30 (A) 08/14/2017 2158   UROBILINOGEN 1.0 02/08/2015 1211   NITRITE NEGATIVE 08/14/2017 2158   LEUKOCYTESUR NEGATIVE 08/14/2017 2158   Sepsis Labs: _1 (procalcitonin:4,lacticidven:4)  )No results found for this or any previous visit  (from the past 240 hour(s)).       Radiology Studies: Dg Chest Port 1 View  Result Date: 08/25/2017 CLINICAL DATA:  Respiratory failure.  Shortness of breath. EXAM: PORTABLE CHEST 1 VIEW COMPARISON:  CTA chest 08/19/2017 FINDINGS: Heart size is exaggerated by low lung volumes. Pacing wires are stable. Chronic left upper lobe airspace disease is again noted. Chronic interstitial disease is stable. No other significant consolidation is present. The heart size and mediastinal contours are within normal limits. Both lungs are clear. The visualized skeletal structures are unremarkable. IMPRESSION: 1. Stable chronic interstitial lung disease. 2. Low lung volumes Electronically Signed   By: Wynetta Fines.D.  On: 08/25/2017 09:09        Scheduled Meds: . citalopram  20 mg Oral Daily  . enoxaparin (LOVENOX) injection  40 mg Subcutaneous Q24H  . furosemide  40 mg Oral Daily  . insulin aspart  0-15 Units Subcutaneous TID WC  . mouth rinse  15 mL Mouth Rinse BID  . Melatonin  6 mg Oral QHS  . pantoprazole  40 mg Oral BID  . predniSONE  50 mg Oral Q breakfast   Continuous Infusions:   LOS: 11 days    Time spent: 40 minutes    Auden Wettstein, Geraldo Docker, MD [facility] Pager [phone]   If 7PM-7AM, please contact night-coverage [email or web address] Password [email or web address] 08/26/2017, 9:03 AM

## 2017-08-26 NOTE — Progress Notes (Signed)
Physical Therapy Treatment Patient Details Name: Wayne Torres MRN: [id number] DOB: 1940/06/27 Today's Date: 08/26/2017    History of Present Illness Pt is a 77 y.o. male admitted for fall with associated right ankle fracture. He was found to be have septic shock and hypoxia. Pt has a history of multiple recent falls. PMH: IPF, systolic CHF, CAD s/p CABG, HTN, PVD, and HLD.    PT Comments    On arrival to room, pt refused PT. When questioned on pt's rational he reported he will not do PT until his ankle is fixed and he is home. Educated pt on the importance of mobility in recovery and precautions to avoid re-injury. Pt seems fearful of doing additional injury to ankle. With max encouragement, pt agreed to transfer to recliner chair. Min guard for safety and cues to maintain WB precautions. Pt unable/or unwilling to off load R LE during pivot transfer despite cues. After transfer was complete, this PTA demonstrated the correct technique to pivot while maintaining NWB on RLE. Will continue to follow acutely however pt may refuse additional attempts at PT.   Follow Up Recommendations  Home health PT;Supervision/Assistance - 24 hour     Equipment Recommendations  Wheelchair (measurements PT);Wheelchair cushion (measurements PT);Other (comment)    Recommendations for Other Services       Precautions / Restrictions Precautions Precautions: Fall Restrictions Weight Bearing Restrictions: Yes RLE Weight Bearing: Non weight bearing    Mobility  Bed Mobility Overal bed mobility: Needs Assistance Bed Mobility: Supine to Sit     Supine to sit: Modified independent (Device/Increase time)        Transfers Overall transfer level: Needs assistance Equipment used: Rolling walker (2 wheeled) Transfers: Sit to/from [facility] Sit to Stand: Supervision Stand pivot transfers: Min guard       General transfer comment: pt moves impulsively, Unable to maintain NWB on RLE  despite cues. After transfer this PTA demonstrated correct pivot technique to avoid WB on R LE.  Ambulation/Gait             General Gait Details: Pt refusing ambulation, only agreeable to transfer   Stairs            Wheelchair Mobility    Modified Rankin (Stroke Patients Only)       Balance Overall balance assessment: Needs assistance Sitting-balance support: Feet supported Sitting balance-Leahy Scale: Good     Standing balance support: During functional activity;Bilateral upper extremity supported Standing balance-Leahy Scale: Poor Standing balance comment: requires bil UE on RW for support                            Cognition Arousal/Alertness: Awake/alert Behavior During Therapy: Impulsive Overall Cognitive Status: Impaired/Different from baseline Area of Impairment: Safety/judgement                         Safety/Judgement: Decreased awareness of safety;Decreased awareness of deficits     General Comments: pt with difficulty maintaining NWB on R LE      Exercises      General Comments General comments (skin integrity, edema, etc.): VVS      Pertinent Vitals/Pain Pain Assessment: No/denies pain    Home Living                      Prior Function            PT Goals (current goals  can now be found in the care plan section) Acute Rehab PT Goals Patient Stated Goal: to go home PT Goal Formulation: With patient/family Time For Goal Achievement: 08/31/17 Potential to Achieve Goals: Good Progress towards PT goals: Not progressing toward goals - comment(pt refusing to participate beyond a transfer.)    Frequency    Min 3X/week      PT Plan Current plan remains appropriate    Co-evaluation              AM-PAC PT "6 Clicks" Daily Activity  Outcome Measure  Difficulty turning over in bed (including adjusting bedclothes, sheets and blankets)?: None Difficulty moving from lying on back to sitting on  the side of the bed? : None Difficulty sitting down on and standing up from a chair with arms (e.g., wheelchair, bedside commode, etc,.)?: Unable Help needed moving to and from a bed to chair (including a wheelchair)?: A Little Help needed walking in hospital room?: A Lot Help needed climbing 3-5 steps with a railing? : Total 6 Click Score: 15    End of Session Equipment Utilized During Treatment: Gait belt;Oxygen Activity Tolerance: Other (comment)(Pt self limiting due to fear of reinjury) Patient left: in chair;with call bell/phone within reach Nurse Communication: Mobility status;Precautions PT Visit Diagnosis: Other abnormalities of gait and mobility (R26.89);Muscle weakness (generalized) (M62.81);History of falling (Z91.81)     Time: 1700-1749 PT Time Calculation (min) (ACUTE ONLY): 12 min  Charges:  $Therapeutic Activity: 8-22 mins                    G Codes:       Benjiman Core, [place] Pager [phone] Acute Rehab   Allena Katz 08/26/2017, 10:29 AM

## 2017-08-26 NOTE — Progress Notes (Signed)
PULMONARY / CRITICAL CARE MEDICINE   Name: Wayne Torres MRN: [id number] DOB: [birth year]    ADMISSION DATE:  08/14/2017 CONSULTATION DATE:  08/14/2017  REFERRING MD:  EDP  CHIEF COMPLAINT:  Feels a little better  HISTORY OF PRESENT ILLNESS:   77 y/o male with a history of IPF, non-compliance with his oxygen at times,  systolic heart failure and CAD was admitted with acute on chronic respiratory failure with hypoxemia after a fall and ankle fracture.  There was concern for a GI bleed on admission but he has not had bleeding since admission.   SUBJECTIVE:  Looks great.  Wants to go home.  OOB in chair.  Still some SOB with activity but improving.  On 6L (wears 4L _0 ).     VITAL SIGNS: BP 113/67 (BP Location: Right Arm)   Pulse 60   Temp 98.2 F (36.8 C) (Oral)   Resp 20   Ht 6' (1.829 m)   Wt 91.9 kg (202 lb 9.6 oz)   SpO2 93%   BMI 27.48 kg/m    INTAKE / OUTPUT:  Intake/Output Summary (Last 24 hours) at 08/26/2017 1132 Last data filed at 08/26/2017 1002 Gross per 24 hour  Intake 360 ml  Output 735 ml  Net -375 ml     PHYSICAL EXAMINATION: General: chronically ill appearing male, NAD OOB in chair  HEENT mm moist, no JVD, HOH Pulmonary: resps even non labored on 6L [place], scattered crackles  Cardiac:S1, S2, RRR, No RMG Abdomen: Obese, soft, non-tender, non-distended Extremities: Warm and dry, Brisk refill to all nailbeds, Right leg in open cast, Good movement  To R and L toes. Neuro/psych: Awake and alert, MAE x 4, Appropriate    LABS:  BMET Recent Labs  Lab 08/24/17 0630 08/25/17 0254 08/26/17 0411  NA 136 135 136  K 4.0 3.9 4.0  CL 96* 97* 97*  CO2 _1 BUN 34* 37* 35*  CREATININE 1.18 1.14 1.13  GLUCOSE 130* 123* 107*    Electrolytes Recent Labs  Lab 08/24/17 0630 08/25/17 0254 08/26/17 0411  CALCIUM 8.9 8.6* 8.7*  MG  --   --  2.1    CBC Recent Labs  Lab 08/23/17 0344 08/25/17 0254 08/26/17 0411  WBC 9.2 11.5* 14.0*  HGB  15.8 17.1* 16.9  HCT 47.4 49.9 51.4  PLT 247 268 290    Coag's No results for input(s): APTT, INR in the last 168 hours.  Sepsis Markers No results for input(s): LATICACIDVEN, PROCALCITON, O2SATVEN in the last 168 hours.  ABG No results for input(s): PHART, PCO2ART, PO2ART in the last 168 hours.  Liver Enzymes No results for input(s): AST, ALT, ALKPHOS, BILITOT, ALBUMIN in the last 168 hours.  Cardiac Enzymes No results for input(s): TROPONINI, PROBNP in the last 168 hours.  Glucose Recent Labs  Lab 08/24/17 2144 08/25/17 0758 08/25/17 1250 08/25/17 1753 08/25/17 2209 08/26/17 0758  GLUCAP 173* 112* 158* 151* 174* 88    Imaging No results found.   STUDIES:  CT chest : honeycombing and traction bronchiectasis noted, some consolidation in the left upper lobe, increased GGO in the bases  3/25 Left leg ultrasound> negative for DVT CT angio 3/28: 1. An area of non opacification in the distal left upper lobe pulmonary artery branch appears somewhat similar to the study of 2018 and most likely related to underlying scarring. A small subsegmental pulmonary embolus is less likely. Clinical correlation is recommended. 2. Background of emphysema and pulmonary fibrosis/interstitial lung disease. No  change in the appearance of the lungs since the CT of 08/18/2017. 3. Cardiomegaly. 4. Aortic Atherosclerosis \ CULTURES: 3.23 blood > neg  ANTIBIOTICS: vanc 3/23 x1 Zosyn 3/23 > 3/25  SIGNIFICANT EVENTS: Admit 3/23  LINES/TUBES:   DISCUSSION: 41 -year-old with hx IPF admitted with acute on chronic respiratory failure r/t heart failure,  ankle fracture, NSTEMI, GI bleed.  Improving slowly with diuresis, high dose steroids now with slow PO steroid taper.   ASSESSMENT / PLAN:  Acute respiratory failure with hypoxemia in setting of acute pulmonary edema and atelectasis superimposed on IPF -IPF flare seems doubtful -His lower extremity Dopplers were negative for DVT -CT  angio: neg for PE. + chronic changes.  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   PCCM will f/u as outpt.  Please call if needed while he remains in hospital.   Nickolas Madrid, NP 08/26/2017  11:32 AM Pager: [phone] or [phone]

## 2017-08-27 LAB — GLUCOSE, CAPILLARY
GLUCOSE-CAPILLARY: 223 mg/dL — AB (ref 65–99)
GLUCOSE-CAPILLARY: 149 mg/dL — AB (ref 65–99)
Glucose-Capillary: 82 mg/dL (ref 65–99)
Glucose-Capillary: 117 mg/dL — ABNORMAL HIGH (ref 65–99)

## 2017-08-27 LAB — CBC
HEMATOCRIT: 52.2 % — AB (ref 39.0–52.0)
HEMOGLOBIN: 17.3 g/dL — AB (ref 13.0–17.0)
MCH: 33.1 pg (ref 26.0–34.0)
MCHC: 33.1 g/dL (ref 30.0–36.0)
MCV: 99.8 fL (ref 78.0–100.0)
Platelets: 289 10*3/uL (ref 150–400)
RBC: 5.23 MIL/uL (ref 4.22–5.81)
RDW: 13.1 % (ref 11.5–15.5)
WBC: 13.7 10*3/uL — ABNORMAL HIGH (ref 4.0–10.5)

## 2017-08-27 LAB — BASIC METABOLIC PANEL
ANION GAP: 10 (ref 5–15)
BUN: 33 mg/dL — ABNORMAL HIGH (ref 6–20)
CHLORIDE: 98 mmol/L — AB (ref 101–111)
CO2: 29 mmol/L (ref 22–32)
Calcium: 8.7 mg/dL — ABNORMAL LOW (ref 8.9–10.3)
Creatinine, Ser: 1.13 mg/dL (ref 0.61–1.24)
GFR calc non Af Amer: >60 mL/min (ref >60)
GLUCOSE: 94 mg/dL (ref 65–99)
POTASSIUM: 3.7 mmol/L (ref 3.5–5.1)
Sodium: 137 mmol/L (ref 135–145)

## 2017-08-27 LAB — MAGNESIUM: MAGNESIUM: 1.9 mg/dL (ref 1.7–2.4)

## 2017-08-27 NOTE — Progress Notes (Addendum)
NCM spoke to Foster Simpson # [phone]. She has spoken to [facility] to pick up oxygen. Contacted [facility] with new orders for oxygen and hospital bed. Pt will need DME delivered to home prior to dc, he will transport to home via [facility] ([facility] paperwork on shadow chart)    Spoke to [facility] and they cannot service oxygen. Pt has one payment left with [facility] ([facility]). [facility] can deliver hospital. Contacted [facility] to not  pick up of oxygen at home. Informed dtr that pt will need to continue oxygen with [facility].   Made appt for Dr Smitty Cords on 09/07/2017 at 2:45 pm. Dr Jamse Arn does not see pt in the Clinic. Made Dr Sherral Hammers aware and he will discuss with pt and family. Will keep appt with  Current Pulmonologist. Contacted to make appt with Dr Harrington Challenger, she does not have any upcoming appts, Scheduled appt with [facility] on 09/21/2017 at 8:30 am. Jonnie Finner RN CCM Case Mgmt phone [phone]

## 2017-08-27 NOTE — Progress Notes (Signed)
Marland Kitchen  Durable Medical Equipment  (From admission, onward)        Start     Ordered   08/27/17 1408  For home use only DME oxygen  Once    Question Answer Comment  Mode or (Route) Nasal cannula   Liters per Minute 5   Frequency Continuous (stationary and portable oxygen unit needed)   Oxygen delivery system Gas      08/27/17 1408   08/27/17 0900  For home use only DME Hospital bed  Once    Question Answer Comment  Patient has (list medical condition): Unstable RIGHT ankle fracture nonweightbearing   The above medical condition requires: Patient requires the ability to reposition frequently   Bed type Semi-electric   Hoyer Lift Yes   Support Surface: Gel Overlay      08/27/17 0900

## 2017-08-27 NOTE — Progress Notes (Signed)
SATURATION QUALIFICATIONS: (This note is used to comply with regulatory documentation for home oxygen)  Patient Saturations on Room Air at Rest = 83%  Patient Saturations on Room Air while Ambulating = patient is not ambulatory  Patient Saturations on Liters of oxygen while Ambulating =   Please briefly explain why patient needs home oxygen: patient is not ambulatory and quickly de-sats when taken off 4 Litters of oxygen when resting.

## 2017-08-27 NOTE — Progress Notes (Addendum)
PROGRESS NOTE    Wayne Torres  CWC:[id number] DOB: 1940/05/28 DOA: 08/14/2017 PCP: [name], PA-C   Brief Narrative:  77 y/o WM PMHx  IPF, systolic heart failure, and CAD   Admitted with acute on chronic respiratory fialure with hypoxemia after a fall and ankle fracture.  There was concern for a GI bleed on admission but he has not had bleeding since admission.   Review of EMR reveals that patient fractured right ankle on 3/21 was seen by Dr. Lenn Sink health:PLAN: Placed in a well-padded short leg [place] type splint, with an mold placed by Dr. Judeth Cornfield. [place] orthopedic surgeon. He was instructed on strict elevation of the right lower extremity over these next several days. Prescription for wheelchair provided. He has pain medications as provided by the emergency department, and declined further prescriptions. He will see Dr. Tawni Millers next week for skin check, and tentatively scheduled for open reduction with internal fixation of the right ankle fracture by Dr. Tawni Millers next Friday. He will be scheduled to see surgical wellness in the interim for medical clearance/optimization. Unfortunately patient wound up hospitalized here before plan could be effectively implemented   Subjective: 4/5 A/O x4, negative CP, negative abdominal pain.  Positive acute on chronic S OB, currently on 5 L O2 via [place].     Assessment & Plan:   Active Problems:   Acute GI bleeding   CHB (complete heart block) (HCC)   Chronic combined systolic and diastolic CHF (congestive heart failure) (HCC)   Acute on chronic respiratory failure (HCC)   Shock ([place])   Type 2 myocardial infarction (HCC)   Ankle fracture  Acute on Chronic Respiratory failure with Hypoxia (patient's baseline O2 demand 3.5-4 L O2 via [place]) -Multifactorial acute pulmonary edema, atelectasis, IPF, COPD. -Dopplers negative for DVT: CT angiogram negative for PE - Prednisone 50 mg daily.  Slowly taper over 3 weeks.  Patient  request reassignment to pulmonologist at [facility].  Will attempt to facilitate prior to discharge.  Will also inform patient and wife to cancel their follow-up on 4/12 NP [facility] pulmonology, and Dr. Brand Males on 5/7. - Titrate O2 to maintain SPO2 88 - 92% -Patient currently on 5 L O2 via [place], which is slightly higher than his home O2 regimen. -Spoke at length with patient and wife understand they will not be able to obtain cardiopulmonary rehab until RIGHT unstable ankle fracture repaired and released by orthopedic surgeon.  COPD/IPF -Schedule establish care appointment in 1-2 weeks Dr. Verita [facility] pulmonology, acute on chronic respiratory failure with hypoxia secondary to COPD/IPF  Chronic systolic and Diastolic CHF (EF 76-16%) decompensated.  Base weight? -Strict in and out since admission -13.7 L -Daily weight Filed Weights   08/24/17 0500 08/25/17 0327 08/27/17 0500  Weight: 204 lb 5.9 oz (92.7 kg) 202 lb 9.6 oz (91.9 kg) 204 lb 9.4 oz (92.8 kg)  -Lasix 40 mg daily per cardiology recommendation.  We will continue diuresis. - Schedule follow-up 4 weeks with Dr. 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 - Continue conservative treatment: No plans for cardiac catheterization   Dark stools no clear bleeding -Protonix  40 mg BID   RIGHT Ankle Fracture - Unstable fracture consulted Dr. Hilbert Odor orthopedic surgery who recommended patient allow surgery however patient declined stating he wanted to stay with his orthopedic surgeon in [place].  - Cardiology recommends regional  anesthesia if possible for ankle surgery to reduce cardiac risk -Schedule follow-up withDr. Judeth Cornfield. [place] orthopedic surgeon so in 1 week for repair of unstable right ankle fracture   Hyperglycemia -Iatrogenic secondary to steroid - Moderate SSI  Goals of care - 4/5 spoke at length with patient, wife and daughter RN Vinnie Level [facility]), concerning plan of care.  Patient is insistent that he be discharged and have his RIGHT ankle repaired by Dr. Judeth Cornfield. [place] orthopedic surgeon in [place]..  NCM order Hospital bed, [facility] lift, contact Advance care provide home O2 (patient currently has [facility])   DVT prophylaxis: Lovenox Code Status: Full Family Communication: None Disposition Plan: TBD   Consultants:  PCCM Cardiology   Procedures/Significant Events:  3/23 admit 3/24 DG RIGHT ankle:-Oblique fracture of the fibula suggesting an eversion injury. - Minimally displaced posterior tibial fracture.- Probable fracture at the medial malleolus. - Widening of the medial tibiotalar space. 3/25 Echocardiogram: LVEF= 35% to 40%. Severe hypokinesis of the inferolateral and inferior myocardium; consistent with ischemia in the distribution of the right coronary or left circumflex coronary artery.  - Ventricular septum: Septal motion showed paradox. These changes are consistent with right ventricular pacing. - Right ventricle: severely dilated. Wall - Pulmonary arteries: PA peak pressure: 52 mm Hg (S), assuming normal RA pressure of 3   mm Hg. 3/30 TRH assumed care     I have personally reviewed and interpreted all radiology studies and my findings are as above.  VENTILATOR SETTINGS:    Cultures   Antimicrobials: Anti-infectives (From admission, onward)   Start     Stop   08/16/17 0000  vancomycin (VANCOCIN) 1,250 mg in sodium chloride 0.9 % 250 mL IVPB  Status:  Discontinued     08/15/17 0954   08/15/17 0800  piperacillin-tazobactam (ZOSYN) IVPB 3.375 g   Status:  Discontinued     08/16/17 1353   08/15/17 0145  vancomycin (VANCOCIN) 2,000 mg in sodium chloride 0.9 % 500 mL IVPB     08/15/17 0454   08/15/17 0145  piperacillin-tazobactam (ZOSYN) IVPB 3.375 g     08/15/17 0245       Devices    LINES / TUBES:      Continuous Infusions:   Objective: Vitals:   08/27/17 0519 08/27/17 0522 08/27/17 0757 08/27/17 0816  BP:  125/80    Pulse: (!) 55 (!) 57  62  Resp: _0 Temp: 97.7 F (36.5 C)  97.9 F (36.6 C)   TempSrc: Oral  Oral   SpO2: 98% 95%    Weight:      Height:        Intake/Output Summary (Last 24 hours) at 08/27/2017 0842 Last data filed at 08/27/2017 0521 Gross per 24 hour  Intake -  Output 1375 ml  Net -1375 ml   Filed Weights   08/24/17 0500 08/25/17 0327 08/27/17 0500  Weight: 204 lb 5.9 oz (92.7 kg) 202 lb 9.6 oz (91.9 kg) 204 lb 9.4 oz (92.8 kg)    Physical Exam:  General: A/O x4, positive acute on chronic respiratory distress (almost back to baseline O2 requirement) Neck:  Negative scars, masses, torticollis, lymphadenopathy, JVD Lungs: diffuse poor air movement throughout (significantly improved), negative  wheezes or crackles  Cardiovascular: Regular rate and rhythm without murmur gallop or rub normal S1 and S2 Abdomen: negative abdominal pain, nondistended, positive soft, bowel sounds, no rebound, no ascites, no appreciable mass Extremities: positive RLE in a cast, toes appear cyanotic but good DP pulse, good capillary refill to all metatarsals  Skin: Negative rashes, lesions, ulcers Psychiatric:  Negative depression, negative anxiety, negative fatigue, negative mania  Central nervous system:  Cranial nerves II through XII intact, tongue/uvula midline, all extremities muscle strength 5/5, sensation intact throughout, negative dysarthria, negative expressive aphasia, negative receptive aphasia    .     Data Reviewed: Care during the described time interval was provided by me .  I have  reviewed this patient's available data, including medical history, events of note, physical examination, and all test results as part of my evaluation.   CBC: Recent Labs  Lab 08/22/17 0456 08/23/17 0344 08/25/17 0254 08/26/17 0411 08/27/17 0719  WBC 9.1 9.2 11.5* 14.0* 13.7*  HGB 15.6 15.8 17.1* 16.9 17.3*  HCT 46.8 47.4 49.9 51.4 52.2*  MCV 97.5 97.9 97.8 98.7 99.8  PLT 223 247 268 290 741   Basic Metabolic Panel: Recent Labs  Lab 08/22/17 0456 08/23/17 0344 08/24/17 0630 08/25/17 0254 08/26/17 0411  NA 135 136 136 135 136  K 3.8 4.0 4.0 3.9 4.0  CL 98* 98* 96* 97* 97*  CO2 _0 GLUCOSE 171* 162* 130* 123* 107*  BUN 33* 37* 34* 37* 35*  CREATININE 1.24 1.12 1.18 1.14 1.13  CALCIUM 9.0 8.8* 8.9 8.6* 8.7*  MG  --   --   --   --  2.1   GFR: Estimated Creatinine Clearance: 62 mL/min (by C-G formula based on SCr of 1.13 mg/dL). Liver Function Tests: No results for input(s): AST, ALT, ALKPHOS, BILITOT, PROT, ALBUMIN in the last 168 hours. No results for input(s): LIPASE, AMYLASE in the last 168 hours. No results for input(s): AMMONIA in the last 168 hours. Coagulation Profile: No results for input(s): INR, PROTIME in the last 168 hours. Cardiac Enzymes: No results for input(s): CKTOTAL, CKMB, CKMBINDEX, TROPONINI in the last 168 hours. BNP (last 3 results) Recent Labs    06/18/17 0946  PROBNP 476   HbA1C: No results for input(s): HGBA1C in the last 72 hours. CBG: Recent Labs  Lab 08/26/17 0758 08/26/17 1226 08/26/17 1707 08/26/17 2158 08/27/17 0756  GLUCAP 88 129* 176* 169* 82   Lipid Profile: No results for input(s): CHOL, HDL, LDLCALC, TRIG, CHOLHDL, LDLDIRECT in the last 72 hours. Thyroid Function Tests: No results for input(s): TSH, T4TOTAL, FREET4, T3FREE, THYROIDAB in the last 72 hours. Anemia Panel: No results for input(s): VITAMINB12, FOLATE, FERRITIN, TIBC, IRON, RETICCTPCT in the last 72 hours. Urine analysis:    Component Value  Date/Time   COLORURINE AMBER (A) 08/14/2017 2158   APPEARANCEUR HAZY (A) 08/14/2017 2158   LABSPEC 1.025 08/14/2017 2158   PHURINE 5.0 08/14/2017 2158   GLUCOSEU NEGATIVE 08/14/2017 2158   GLUCOSEU NEGATIVE 02/08/2015 1211   HGBUR SMALL (A) 08/14/2017 2158   BILIRUBINUR NEGATIVE 08/14/2017 2158   [place] NEGATIVE 08/14/2017 2158   PROTEINUR 30 (A) 08/14/2017 2158   UROBILINOGEN 1.0 02/08/2015 1211   NITRITE NEGATIVE 08/14/2017 2158   LEUKOCYTESUR NEGATIVE 08/14/2017 2158   Sepsis Labs: _1 (procalcitonin:4,lacticidven:4)  )No results found for this or any previous visit (from the past 240 hour(s)).       Radiology Studies: No results found.      Scheduled Meds: . citalopram  20 mg Oral Daily  . enoxaparin (LOVENOX) injection  40 mg Subcutaneous Q24H  . furosemide  40 mg Oral Daily  . insulin aspart  0-15 Units Subcutaneous TID WC  . mouth rinse  15 mL Mouth Rinse BID  . Melatonin  6 mg Oral QHS  . pantoprazole  40 mg Oral BID  . predniSONE  50 mg Oral Q breakfast   Continuous Infusions:   LOS: 12 days    Time spent: 40 minutes    WOODS, Geraldo Docker, MD [facility] Pager [phone]   If 7PM-7AM, please contact night-coverage [email or web address] Password [facility] 08/27/2017, 8:42 AM

## 2017-08-27 NOTE — Progress Notes (Signed)
Occupational Therapy Treatment Patient Details Name: Wayne Torres MRN: [id number] DOB: 09-05-1940 Today's Date: 08/27/2017    History of present illness Pt is a 77 y.o. male admitted for fall with associated right ankle fracture. He was found to be have septic shock and hypoxia. Pt has a history of multiple recent falls. PMH: IPF, systolic CHF, CAD s/p CABG, HTN, PVD, and HLD.   OT comments  Pt is self limiting. Performed seated grooming at EOB, but declined and OOB activity. Educated pt in benefits for maintaining strength and endurance for impending R LE surgery and continuing to practice NWB on R LE. Pt with poor insight. Has w/c at home.  Follow Up Recommendations  Home health OT;Supervision/Assistance - 24 hour    Equipment Recommendations  None recommended by OT    Recommendations for Other Services      Precautions / Restrictions Precautions Precautions: Fall Restrictions Weight Bearing Restrictions: Yes RLE Weight Bearing: Non weight bearing       Mobility Bed Mobility Overal bed mobility: Needs Assistance Bed Mobility: Supine to Sit;Sit to Supine     Supine to sit: Supervision Sit to supine: Supervision   General bed mobility comments: supervision for safety  Transfers                      Balance     Sitting balance-Leahy Scale: Good                                     ADL either performed or assessed with clinical judgement   ADL Overall ADL's : Needs assistance/impaired     Grooming: Wash/dry hands;Wash/dry face;Oral care;Brushing hair;Sitting;Supervision/safety                                 General ADL Comments: pt declining OOB to chair, stating he stayed up all day yesterday and so he does not need to today, encouraged OOB for meals     Vision       Perception     Praxis      Cognition Arousal/Alertness: Awake/alert Behavior During Therapy: Impulsive Overall Cognitive Status:  Impaired/Different from baseline Area of Impairment: Safety/judgement                         Safety/Judgement: Decreased awareness of safety;Decreased awareness of deficits     General Comments: pt with difficulty maintaining NWB on R LE        Exercises     Shoulder Instructions       General Comments      Pertinent Vitals/ Pain       Pain Assessment: Faces Faces Pain Scale: No hurt  Home Living                                          Prior Functioning/Environment              Frequency  Min 2X/week        Progress Toward Goals  OT Goals(current goals can now be found in the care plan section)  Progress towards OT goals: Progressing toward goals  Acute Rehab OT Goals Patient Stated Goal: to go home OT Goal Formulation: With patient  Time For Goal Achievement: 09/08/17 Potential to Achieve Goals: Good  Plan Discharge plan remains appropriate    Co-evaluation                 AM-PAC PT "6 Clicks" Daily Activity     Outcome Measure   Help from another person eating meals?: None Help from another person taking care of personal grooming?: A Little Help from another person toileting, which includes using toliet, bedpan, or urinal?: A Little Help from another person bathing (including washing, rinsing, drying)?: A Little Help from another person to put on and taking off regular upper body clothing?: None Help from another person to put on and taking off regular lower body clothing?: A Little 6 Click Score: 20    End of Session Equipment Utilized During Treatment: Oxygen(5L)  OT Visit Diagnosis: Unsteadiness on feet (R26.81);Other symptoms and signs involving cognitive function;Muscle weakness (generalized) (M62.81);History of falling (Z91.81)   Activity Tolerance Patient tolerated treatment well   Patient Left in bed;with call bell/phone within reach;with bed alarm set   Nurse Communication          Time:  8377-9396 OT Time Calculation (min): 10 min  Charges: OT General Charges $OT Visit: 1 Visit OT Treatments $Self Care/Home Management : 8-22 mins  08/27/2017 Nestor Lewandowsky, OTR/L Pager: [phone]   Werner Lean, Haze Boyden 08/27/2017, 3:21 PM

## 2017-08-28 DIAGNOSIS — I248 Other forms of acute ischemic heart disease: Secondary | ICD-10-CM

## 2017-08-28 DIAGNOSIS — J431 Panlobular emphysema: Secondary | ICD-10-CM

## 2017-08-28 DIAGNOSIS — I5042 Chronic combined systolic (congestive) and diastolic (congestive) heart failure: Secondary | ICD-10-CM

## 2017-08-28 DIAGNOSIS — I272 Pulmonary hypertension, unspecified: Secondary | ICD-10-CM

## 2017-08-28 LAB — CBC
HCT: 48.8 % (ref 39.0–52.0)
Hemoglobin: 15.7 g/dL (ref 13.0–17.0)
MCH: 31.6 pg (ref 26.0–34.0)
MCHC: 32.2 g/dL (ref 30.0–36.0)
MCV: 98.2 fL (ref 78.0–100.0)
Platelets: 299 10*3/uL (ref 150–400)
RBC: 4.97 MIL/uL (ref 4.22–5.81)
RDW: 13.2 % (ref 11.5–15.5)
WBC: 12.3 10*3/uL — ABNORMAL HIGH (ref 4.0–10.5)

## 2017-08-28 LAB — BASIC METABOLIC PANEL
Anion gap: 10 (ref 5–15)
BUN: 33 mg/dL — AB (ref 6–20)
CALCIUM: 8.7 mg/dL — AB (ref 8.9–10.3)
CO2: 28 mmol/L (ref 22–32)
Chloride: 98 mmol/L — ABNORMAL LOW (ref 101–111)
Creatinine, Ser: 1.15 mg/dL (ref 0.61–1.24)
GFR calc Af Amer: >60 mL/min (ref >60)
GLUCOSE: 186 mg/dL — AB (ref 65–99)
Potassium: 3.8 mmol/L (ref 3.5–5.1)
Sodium: 136 mmol/L (ref 135–145)

## 2017-08-28 LAB — GLUCOSE, CAPILLARY
GLUCOSE-CAPILLARY: 81 mg/dL (ref 65–99)
Glucose-Capillary: 177 mg/dL — ABNORMAL HIGH (ref 65–99)

## 2017-08-28 LAB — MAGNESIUM: MAGNESIUM: 2 mg/dL (ref 1.7–2.4)

## 2017-08-28 MED ORDER — OXYCODONE-ACETAMINOPHEN 5-325 MG PO TABS: 1.0000 | ORAL_TABLET | Freq: Four times a day (QID) | ORAL | 0 refills | Status: AC | PRN

## 2017-08-28 MED ORDER — MELATONIN 3 MG PO TABS: 6.0000 mg | ORAL_TABLET | Freq: Every day | ORAL | 0 refills | Status: AC

## 2017-08-28 MED ORDER — PREDNISONE 20 MG PO TABS: 30.0000 mg | ORAL_TABLET | Freq: Every day | ORAL | 0 refills | Status: AC

## 2017-08-28 MED ORDER — PREDNISONE 10 MG PO TABS: 5.0000 mg | ORAL_TABLET | Freq: Every day | ORAL | 0 refills | Status: AC

## 2017-08-28 MED ORDER — FUROSEMIDE 40 MG PO TABS: 40.0000 mg | ORAL_TABLET | Freq: Every day | ORAL | 0 refills | Status: AC

## 2017-08-28 MED ORDER — PREDNISONE 10 MG PO TABS: 10.0000 mg | ORAL_TABLET | Freq: Every day | ORAL | 0 refills | Status: AC

## 2017-08-28 MED ORDER — ALPRAZOLAM 0.25 MG PO TABS: 0.2500 mg | ORAL_TABLET | Freq: Two times a day (BID) | ORAL | 0 refills | Status: AC | PRN

## 2017-08-28 MED ORDER — PREDNISONE 20 MG PO TABS: 20.0000 mg | ORAL_TABLET | Freq: Every day | ORAL | 0 refills | Status: AC

## 2017-08-28 MED ORDER — PREDNISONE 50 MG PO TABS: ORAL_TABLET | ORAL | 0 refills | Status: AC

## 2017-08-28 MED ORDER — POLYETHYLENE GLYCOL 3350 17 G PO PACK: 17.0000 g | PACK | Freq: Every day | ORAL | 0 refills | Status: AC | PRN

## 2017-08-28 NOTE — Discharge Summary (Signed)
Physician Discharge Summary  Wayne Torres HCW:[id number] DOB: 01/18/1941 DOA: 08/14/2017  PCP: [name], PA-C  Admit date: 08/14/2017 Discharge date: 08/28/2017  Time spent:  35 minutes  Recommendations for Outpatient Follow-up:   Acute on Chronic Respiratory failure with Hypoxia (patient's baseline O2 demand 3.5-4 L O2 via [place]) -Multifactorial acute pulmonary edema, atelectasis, IPF, COPD. -Dopplers negative for DVT: CT angiogram negative for PE - Prednisone 50 mg daily.  Slowly taper over 3 weeks.  Patient request reassignment to pulmonologist at [facility].   -Patient has follow-up in system with 4/12 NP [facility] pulmonology, and Dr. Brand Males on 5/7. - Titrate O2 to maintain SPO2 88 - 92% -Patient currently on 5 L O2 via [place], which is slightly higher than his home O2 regimen. -Spoke at length with patient and wife understand they will not be able to obtain cardiopulmonary rehab until RIGHT unstable ankle fracture repaired and released by orthopedic surgeon.   COPD/IPF -Schedule establish care appointment in 1-2 weeks Dr. Verita Lamb B. [facility] pulmonology, acute on chronic respiratory failure with hypoxia secondary to COPD/IPF   Chronic systolic and Diastolic CHF (EF 17-61%) decompensated.  Base weight? -Strict in and out since admission -13.7 L -Daily weight      Filed Weights    08/24/17 0500 08/25/17 0327 08/27/17 0500  Weight: 204 lb 5.9 oz (92.7 kg) 202 lb 9.6 oz (91.9 kg) 204 lb 9.4 oz (92.8 kg)  -Lasix 40 mg daily per cardiology recommendation.    Patient to continue diuresis at discharge.   - Schedule follow-up 4 weeks with Dr. 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 - Continue conservative treatment: No plans for cardiac catheterization   Dark stools no clear bleeding -Protonix 40 mg BID   RIGHT Ankle Fracture - Unstable fracture consulted Dr. Hilbert Odor orthopedic surgery who recommended patient allow surgery however patient declined stating he wanted to stay with his orthopedic surgeon in [place].  - Cardiology recommends regional anesthesia if possible for ankle surgery to reduce cardiac risk -Schedule follow-up with Dr. Judeth Cornfield. [place] orthopedic surgeon so in 1 week for repair of unstable right ankle fracture   Hyperglycemia -Iatrogenic secondary to steroid - Moderate SSI   Goals of care - NCM order Hospital bed, [facility] lift, contact [facility] provide home O2 (patient currently has [place])     Discharge Diagnoses:  Active Problems:   Acute GI bleeding   CHB (complete heart block) (HCC)   Chronic combined systolic and diastolic CHF (congestive heart failure) (HCC)   Acute on chronic respiratory failure (HCC)   Shock ([place])   Type 2 myocardial infarction [facility])   Ankle fracture   Discharge Condition: Stable  Diet recommendation: Heart healthy  Filed Weights   08/24/17 0500 08/25/17 0327 08/27/17 0500  Weight: 204 lb 5.9 oz (92.7 kg) 202 lb 9.6 oz (91.9 kg) 204 lb 9.4 oz (92.8 kg)    History of present illness:  77 y/o WM PMHx  IPF, systolic heart failure, and CAD    Admitted with acute on chronic respiratory fialure with hypoxemia after a fall and ankle fracture.  There was concern for  a GI bleed on admission but he has not had bleeding since admission.    Review of EMR reveals that patient fractured right ankle on 3/21 was seen by Dr. Lenn Sink health:PLAN: Placed in a well-padded short leg Cadillac type splint, with an mold placed by Dr. Judeth Cornfield. [place] orthopedic surgeon. He was instructed on  strict elevation of the right lower extremity over these next several days. Prescription for wheelchair provided. He has pain medications as provided by the emergency department, and declined further prescriptions. He will see Dr. Tawni Millers next week for skin check, and tentatively scheduled for open reduction with internal fixation of the right ankle fracture by Dr. Tawni Millers next Friday. He will be scheduled to see surgical wellness in the interim for medical clearance/optimization. Unfortunately patient wound up hospitalized here before plan could be effectively implemented  During his hospitalization patient treated for acute on chronic respiratory failure with hypoxia secondary to COPD exacerbation, IPF, and decompensated chronic systolic and diastolic CHF.  Patient aggressively diuresed, placed on steroids for COPD exacerbation/IPF and has had improvement with respiratory status.  Patient still has RIGHT ankle unstable fracture which she declined to have repaired here, preferring to return to previous orthopedic surgeon who initially started workup.  Stable for discharge.     Procedure 3/23 admit 3/24 DG RIGHT ankle:-Oblique fracture of the fibula suggesting an eversion injury. - Minimally displaced posterior tibial fracture.- Probable fracture at the medial malleolus. - Widening of the medial tibiotalar space. 3/25 Echocardiogram: LVEF= 35% to 40%. Severe hypokinesis of the inferolateral and inferior myocardium; consistent with ischemia in the distribution of the right coronary or left circumflex coronary artery.  - Ventricular septum: Septal motion showed paradox. These changes are consistent with right ventricular pacing. - Right ventricle: severely dilated. Wall - Pulmonary arteries: PA peak pressure: 52 mm Hg (S), assuming normal RA pressure of 3   mm Hg. 3/30 TRH assumed care    Consultations: PCCM Cardiology     Discharge Exam: Vitals:   08/27/17 0757 08/27/17 0816 08/27/17 1135  08/27/17 1356  BP:    111/64  Pulse:  62  83  Resp:  18  18  Temp: 97.9 F (36.6 C)  98.1 F (36.7 C) 97.6 F (36.4 C)  TempSrc: Oral  Oral Oral  SpO2:    93%  Weight:      Height:        General: A/O x4, positive acute on chronic respiratory distress (almost back to baseline O2 requirement) Neck:  Negative scars, masses, torticollis, lymphadenopathy, JVD Lungs: diffuse poor air movement throughout (significantly improved), negative  wheezes or crackles Cardiovascular: Regular rate and rhythm without murmur gallop or rub normal S1 and S2    Discharge Instructions   Allergies as of 08/28/2017      Reactions   Ofev [nintedanib] Other (See Comments)   Diarrhea  Diarrhea    Pirfenidone Nausea And Vomiting, Nausea Only      Medication List    STOP taking these medications   amLODipine 5 MG tablet Commonly known as:  NORVASC   amoxicillin-clavulanate 875-125 MG tablet Commonly known as:  AUGMENTIN   doxycycline 100 MG tablet Commonly known as:  VIBRA-TABS   HYDROcodone-acetaminophen 5-325 MG tablet Commonly known as:  NORCO/VICODIN   lisinopril 2.5 MG tablet Commonly known as:  PRINIVIL,ZESTRIL   metoprolol succinate 25 MG 24 hr tablet Commonly known as:  TOPROL-XL     TAKE these medications   acetaminophen 500 MG tablet Commonly  known as:  TYLENOL Take 1,000 mg by mouth every 6 (six) hours as needed for moderate pain or headache.   ALPRAZolam 0.25 MG tablet Commonly known as:  XANAX Take 1 tablet (0.25 mg total) by mouth 2 (two) times daily as needed for anxiety. What changed:    when to take this  reasons to take this   aspirin EC 81 MG tablet Take 1 tablet (81 mg total) daily by mouth.   citalopram 20 MG tablet Commonly known as:  CELEXA Take 1 tablet (20 mg total) by mouth daily.   fluticasone 50 MCG/ACT nasal spray Commonly known as:  FLONASE Place 2 sprays into the nose daily. Reported on 05/10/2015   furosemide 40 MG tablet Commonly known  as:  LASIX Take 1 tablet (40 mg total) by mouth daily. Start taking on:  08/29/2017   gabapentin 300 MG capsule Commonly known as:  NEURONTIN Take 300 mg by mouth 3 (three) times daily.   Melatonin 3 MG Tabs Take 2 tablets (6 mg total) by mouth at bedtime.   nitroGLYCERIN 0.4 MG SL tablet Commonly known as:  NITROSTAT Place 1 tablet (0.4 mg total) under the tongue every 5 (five) minutes as needed for chest pain.   OFEV 100 MG Caps Generic drug:  Nintedanib Take 100 mg by mouth 2 (two) times daily.   omeprazole 40 MG capsule Commonly known as:  PRILOSEC TAKE ONE CAPSULE BY MOUTH EVERY DAY   oxyCODONE-acetaminophen 5-325 MG tablet Commonly known as:  PERCOCET/ROXICET Take 1-2 tablets by mouth every 6 (six) hours as needed for severe pain.   OXYGEN Inhale into the lungs. 3 lpm qhs  4 lpm pulsed with exertion as needed DME:   polyethylene glycol packet Commonly known as:  MIRALAX / GLYCOLAX Take 17 g by mouth daily as needed (Constipation).   predniSONE 20 MG tablet Commonly known as:  DELTASONE Take 1.5 tablets (30 mg total) by mouth daily. Day 8-15 What changed:  You were already taking a medication with the same name, and this prescription was added. Make sure you understand how and when to take each.   predniSONE 20 MG tablet Commonly known as:  DELTASONE Take 1 tablet (20 mg total) by mouth daily. Day 15-20 What changed:  You were already taking a medication with the same name, and this prescription was added. Make sure you understand how and when to take each.   predniSONE 10 MG tablet Commonly known as:  DELTASONE Take 1 tablet (10 mg total) by mouth daily. Day 20-25 What changed:  You were already taking a medication with the same name, and this prescription was added. Make sure you understand how and when to take each.   predniSONE 10 MG tablet Commonly known as:  DELTASONE Take 0.5 tablets (5 mg total) by mouth daily. Day 26 until completion What changed:   You were already taking a medication with the same name, and this prescription was added. Make sure you understand how and when to take each.   predniSONE 50 MG tablet Commonly known as:  DELTASONE 1 tab PO daily Day 1-7 Start taking on:  08/29/2017 What changed:    medication strength  additional instructions   PRESERVISION AREDS PO Take 1 tablet by mouth 2 (two) times daily.   simvastatin 40 MG tablet Commonly known as:  ZOCOR TAKE 1 TABLET BY MOUTH AT BEDTIME            Durable Medical Equipment  (From admission, onward)  Start     Ordered   08/27/17 1408  For home use only DME oxygen  Once    Question Answer Comment  Mode or (Route) Nasal cannula   Liters per Minute 5   Frequency Continuous (stationary and portable oxygen unit needed)   Oxygen delivery system Gas      08/27/17 1408   08/27/17 0900  For home use only DME Hospital bed  Once    Question Answer Comment  Patient has (list medical condition): Unstable RIGHT ankle fracture nonweightbearing   The above medical condition requires: Patient requires the ability to reposition frequently   Bed type Semi-electric   Hoyer Lift Yes   Support Surface: Gel Overlay      08/27/17 0900     Allergies  Allergen Reactions  . Ofev [Nintedanib] Other (See Comments)    Diarrhea  Diarrhea   . Pirfenidone Nausea And Vomiting and Nausea Only   Follow-up Information    Brand Males, MD Follow up on 09/28/2017.   Specialty:  Pulmonary Disease Why:  at 330pm  Contact information: [place] [address] [phone]        Melvenia Needles, NP Follow up on 09/03/2017.   Specialty:  Pulmonary Disease Why:  at 1015 am  Contact information: [address] [place] [address] [phone]        Ernest Haber., MD Follow up on 09/07/2017.   Specialty:  Sports Medicine Why:  appointment 09/07/2017 at 2:45 pm, please call to reschedule if you are unable to make this  appt Contact information: [place] [address] [phone]        [facility] [place] Follow up.   Contact information: [place] [address] [phone]        [facility] Follow up.   Specialty:  Home Health Services Why:  Home Health RN, Physical Therapy, Occupational Therapy, aide and Social Worker Contact information: [address] [place] [address] [phone]        Leanor Kail, [place] Follow up.   Specialty:  Cardiology Why:  appt scheduled for 09/21/2017 at 8:30 am- please call to reschedule if you cannot make the appointment  Contact information: [place] [address] [phone]            The results of significant diagnostics from this hospitalization (including imaging, microbiology, ancillary and laboratory) are listed below for reference.    Significant Diagnostic Studies: Dg Ankle Complete Right  Result Date: 08/15/2017 CLINICAL DATA:  Ankle fracture. EXAM: RIGHT ANKLE - COMPLETE 3+ VIEW COMPARISON:  None. FINDINGS: Three views are submitted within a plaster cast. Oblique fracture of the distal fibula is displaced 4 mm. A posterior tibial fracture is minimally displaced. The fracture at the medial malleolus is likely present. There is some widening of the medial tibiotalar distance. IMPRESSION: Impressed 1. Oblique fracture of the fibula suggesting an eversion injury. 2. Minimally displaced posterior tibial fracture. 3. Probable fracture at the medial malleolus. 4. Widening of the medial tibiotalar space. Electronically Signed   By: San Morelle M.D.   On: 08/15/2017 14:03   Ct Chest Wo Contrast  Result Date: 08/15/2017 CLINICAL DATA:  Shortness of breath. Status post SP RT for non-small cell lung cancer. Idiopathic pulmonary fibrosis. EXAM: CT CHEST WITHOUT CONTRAST TECHNIQUE: Multidetector CT imaging of the chest  was performed following the standard protocol without IV  contrast. COMPARISON:  One-view chest x-ray 08/14/2017. CT of the chest 04/05/2017. FINDINGS: Cardiovascular: Pacing wires are in place. The patient is status post median sternotomy for CABG. Atherosclerotic calcifications are present in the aorta without aneurysm. Pulmonary arteries are within normal limits. Mediastinum/Nodes: No significant mediastinal, axillary, or hilar adenopathy is present. The thoracic inlet is within normal limits. The esophagus is unremarkable. Lungs/Pleura: Post treatment changes of the left upper lobe are again seen. There is increase consolidation since the prior exam. Centrilobular emphysematous changes are present. Extensive peripheral fibrosis is noted. The 5 mm right middle lobe nodule is stable. Increased airspace opacities are present at the medial lower lobes bilaterally. This likely reflects atelectasis or infection. Upper Abdomen: Atherosclerotic calcifications are present in the aorta and branch vessels without aneurysm. Limited imaging of the solid organs is within normal limits. Musculoskeletal: Exaggerated thoracic kyphosis is stable. No focal lytic or blastic lesions are present. IMPRESSION: 1. Post treatment changes of the left upper lobe with increased consolidation. 2. Stable 5 mm nodule in the right middle lobe. 3. Increased airspace opacities in the medial lower lobes bilaterally. This is likely related to atelectasis or infection. Metastatic disease is considered unlikely. Recommend short-term follow-up CT chest without contrast at 2-3 months for further evaluation. 4.  Aortic Atherosclerosis (ICD10-I70.0). 5.  Emphysema (ICD10-J43.9). Electronically Signed   By: San Morelle M.D.   On: 08/15/2017 14:01   Ct Angio Chest Pe W Or Wo Contrast  Result Date: 08/20/2017 CLINICAL DATA:  77 year old male with history of pulmonary fibrosis presenting with shortness of breath. EXAM: CT ANGIOGRAPHY CHEST WITH  CONTRAST TECHNIQUE: Multidetector CT imaging of the chest was performed using the standard protocol during bolus administration of intravenous contrast. Multiplanar CT image reconstructions and MIPs were obtained to evaluate the vascular anatomy. CONTRAST:  <See Chart> ISOVUE-370 IOPAMIDOL (ISOVUE-370) INJECTION 76% COMPARISON:  Chest radiograph dated 08/19/2017 and CT dated 08/18/2017 and chest CT dated 04/05/2017 FINDINGS: Cardiovascular: There is mild cardiomegaly. Coronary vascular calcification and postsurgical changes of CABG. No pericardial effusion. Left pectoral pacemaker device. There is mild atherosclerotic calcification of the thoracic aorta. No aneurysmal dilatation or evidence of dissection. Mild dilatation of the main pulmonary trunk suggestive of underlying pulmonary hypertension. Evaluation of the pulmonary arteries is limited due to respiratory motion artifact. No large or central pulmonary artery embolus identified. There is apparent non opacification of a subsegmental branch in the anterior left upper lobe (series 6 image 113) which appears somewhat similar to the study of 04/05/2017 and most likely represents scarring. Acute subsegmental pulmonary embolus favored less likely. Clinical correlation is recommended. Mediastinum/Nodes: There is no hilar or mediastinal adenopathy. The esophagus and the thyroid gland are grossly unremarkable. No mediastinal fluid collection. Lungs/Pleura: There is moderate centrilobular and paraseptal emphysema. There is background of pulmonary fibrosis as described on the recent high-resolution chest CT dated 08/18/2017. Consolidative change/fibrosis in the lingula appears similar to the prior CT. No new consolidative changes. There is no pleural effusion or pneumothorax. The central airways are patent. Upper Abdomen: Colonic diverticulosis. The visualized upper abdomen is otherwise unremarkable. Musculoskeletal: Degenerative changes of the spine. No acute osseous  pathology. Review of the MIP images confirms the above findings. IMPRESSION: 1. An area of non opacification in the distal left upper lobe pulmonary artery branch appears somewhat similar to the study of 2018 and most likely related to underlying scarring. A small subsegmental pulmonary embolus is less likely. Clinical correlation is recommended. 2. Background of emphysema and pulmonary fibrosis/interstitial lung disease.  No change in the appearance of the lungs since the CT of 08/18/2017. 3. Cardiomegaly. 4. Aortic Atherosclerosis (ICD10-I70.0) and Emphysema (ICD10-J43.9). These results were called by telephone at the time of interpretation on 08/20/2017 at 12:10 am to Dr. Selena Batten, who verbally acknowledged these results. Electronically Signed   By: Anner Crete M.D.   On: 08/20/2017 00:12   US Renal  Result Date: 08/15/2017 CLINICAL DATA:  Acute kidney injury EXAM: RENAL / URINARY TRACT ULTRASOUND COMPLETE COMPARISON:  None. FINDINGS: Right Kidney: Length: 11.8 cm. Echogenicity within normal limits. There is mild cortical thinning. No mass or hydronephrosis visualized. Lower pole cyst measures 1.8 x 1.1 x 1.3 cm. Left Kidney: Length: 12.6 cm. Echogenicity within normal limits. No mass or hydronephrosis visualized. Cyst at the lower pole measures 4.2 x 3.0 x 3.9 cm. Bladder: Appears normal for degree of bladder distention. Other: Small volume right upper quadrant ascites. IMPRESSION: 1. No hydronephrosis or other acute abnormality. 2. Small volume right upper quadrant ascites. Electronically Signed   By: Ulyses Jarred M.D.   On: 08/15/2017 05:59   Ct Chest High Resolution  Result Date: 08/19/2017 CLINICAL DATA:  Inpatient. Admitted with acute on chronic respiratory failure after fall with ankle fracture. 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 History of IPF. Former smoker. EXAM: CT CHEST WITHOUT CONTRAST TECHNIQUE: Multidetector CT imaging  of the chest was performed following the standard protocol without intravenous contrast. High resolution imaging of the lungs, as well as inspiratory and expiratory imaging, was performed. COMPARISON:  Chest radiograph from one day prior. 08/15/2017 chest CT. FINDINGS: Cardiovascular: Stable mild cardiomegaly. No significant pericardial fluid/thickening. Stable configuration of 2 lead left subclavian pacemaker with lead tips in the right atrium and right ventricular apex. Three-vessel coronary atherosclerosis status post CABG. Atherosclerotic nonaneurysmal thoracic aorta. Stable dilated main pulmonary artery (3.7 cm diameter). Mediastinum/Nodes: No discrete thyroid nodules. Unremarkable esophagus. No pathologically enlarged axillary, mediastinal or discrete hilar lymph nodes, noting limited sensitivity for the detection of hilar adenopathy on this noncontrast study. Lungs/Pleura: No pneumothorax. Trace dependent bilateral pleural effusions, decreased bilaterally. Moderate centrilobular and paraseptal emphysema. There is sharply marginated bandlike consolidation in the left upper lobe with associated volume loss and distortion, stable in thickness although increased in density since 04/05/2017 chest CT. The previously visualized consolidation in medial basilar lower lobes bilaterally on the 08/15/2017 chest CT has largely resolved, compatible with improved aeration and decreased atelectasis. No acute consolidative airspace disease or lung masses. Basilar right upper lobe 5 mm solid pulmonary nodule (series 6/image 84) appears slightly increased from 3 mm on 04/05/2017 chest CT. Separate basilar right upper lobe 5 mm solid pulmonary nodule (series 6/image 73) is stable since 04/05/2017 chest CT. No additional significant pulmonary nodules. No significant lobular air trapping on the expiration sequence. Redemonstrated is patchy confluent subpleural reticulation and ground-glass attenuation throughout both lungs with  associated mild traction bronchiectasis and architectural distortion, with a dependent basilar gradient to these findings. Redemonstrated are unusual confluent prominent cystic airspaces in the subpleural dependent lower lobes, favored to represent a combination of emphysema and honeycombing. These findings have not appreciably progressed since 04/05/2017 chest CT. Upper abdomen: Tiny hiatal hernia. Musculoskeletal: No aggressive appearing focal osseous lesions. Intact sternotomy wires. 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 Moderate thoracic spondylosis. IMPRESSION: 1. 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 2.  Improved aeration at the lung bases, with resolved consolidation from the 08/15/2017 chest CT, compatible with resolved atelectasis. No acute consolidative airspace disease to suggest a pneumonia. 3. Stable bandlike radiation fibrosis in the left upper lobe, mildly increased in density since 04/05/2017 chest CT, probably due to superimposed mild hypoventilation. No definite local tumor recurrence. Recommend attention on follow-up chest CT in 3 months. 4. Small 5 mm solid right upper lobe pulmonary nodule appears slightly increased in size since 04/05/2017 chest CT. Recommend attention on follow-up chest CT in 3 months. 5. Trace bilateral pleural effusions, decreased bilaterally. Cardiomegaly. 6. Stable dilated main pulmonary artery, compatible with chronic pulmonary arterial hypertension. Aortic Atherosclerosis (ICD10-I70.0) and Emphysema (ICD10-J43.9). Electronically Signed   By: Ilona Sorrel M.D.   On: 08/19/2017 10:15   Dg Chest Port 1 View  Result Date: 08/25/2017 CLINICAL DATA:  Respiratory failure.  Shortness of breath. EXAM: PORTABLE CHEST 1 VIEW COMPARISON:  CTA chest 08/19/2017 FINDINGS: Heart size is exaggerated by  low lung volumes. Pacing wires are stable. Chronic left upper lobe airspace disease is again noted. Chronic interstitial disease is stable. No other significant consolidation is present. The heart size and mediastinal contours are within normal limits. Both lungs are clear. The visualized skeletal structures are unremarkable. IMPRESSION: 1. Stable chronic interstitial lung disease. 2. Low lung volumes Electronically Signed   By: San Morelle M.D.   On: 08/25/2017 09:09   Dg Chest Port 1 View  Result Date: 08/19/2017 CLINICAL DATA:  Idiopathic pulmonary fibrosis EXAM: PORTABLE CHEST 1 VIEW COMPARISON:  08/17/2017 FINDINGS: Prominent reticular lung markings bilaterally are stable and compatible with chronic fibrosis. No superimposed infiltrate or effusion. Negative for edema. Dual lead pacemaker unchanged in position. IMPRESSION: Pulmonary fibrosis without interval change. No superimposed acute abnormality. Electronically Signed   By: Franchot Gallo M.D.   On: 08/19/2017 07:48   Dg Chest Portable 1 View  Result Date: 08/17/2017 CLINICAL DATA:  Acute on chronic respiratory failure EXAM: PORTABLE CHEST 1 VIEW COMPARISON:  08/16/2017 FINDINGS: Left pacer remains in place, unchanged. Prior CABG. Cardiomegaly. Stable chronic interstitial densities within the lungs, left greater than right. No visible effusions. No acute bony abnormality. IMPRESSION: No significant change since prior study. Electronically Signed   By: Rolm Baptise M.D.   On: 08/17/2017 08:52   Dg Chest Port 1 View  Result Date: 08/16/2017 CLINICAL DATA:  Respiratory failure. EXAM: PORTABLE CHEST 1 VIEW COMPARISON:  Radiograph of August 14, 2017. FINDINGS: Stable cardiomegaly. Status post coronary artery bypass graft. Atherosclerosis of thoracic aorta is noted. Left-sided pacemaker is unchanged in position. Stable interstitial densities are noted bilaterally. Stable left basilar airspace opacity is noted. Bony thorax is unremarkable.  IMPRESSION: Stable bilateral interstitial densities are noted which may represent chronic disease, with stable left lung opacity concerning for possible atelectasis or infiltrate. No significant change compared to prior exam. Aortic Atherosclerosis (ICD10-I70.0). Electronically Signed   By: Marijo Conception, M.D.   On: 08/16/2017 07:50   Dg Chest Portable 1 View  Result Date: 08/14/2017 CLINICAL DATA:  Shortness of breath EXAM: PORTABLE CHEST 1 VIEW COMPARISON:  04/05/2017. FINDINGS: Low lung volumes. The cardio pericardial silhouette is enlarged. There is pulmonary vascular congestion without overt pulmonary edema. Underlying chronic interstitial changes again noted with bibasilar atelectasis/infiltrate. Left-sided permanent pacemaker again noted. Telemetry leads and defibrillator pads overlie the chest. Bones are demineralized. IMPRESSION: Cardiomegaly with low lung volumes and bibasilar atelectasis/infiltrate. Vascular congestion.  Component of interstitial edema not excluded. Underlying chronic interstitial lung  disease. Electronically Signed   By: Misty Stanley M.D.   On: 08/14/2017 23:29    Microbiology: No results found for this or any previous visit (from the past 240 hour(s)).   Labs: Basic Metabolic Panel: Recent Labs  Lab 08/24/17 0630 08/25/17 0254 08/26/17 0411 08/27/17 0719 08/28/17 0204  NA 136 135 136 137 136  K 4.0 3.9 4.0 3.7 3.8  CL 96* 97* 97* 98* 98*  CO2 _0 GLUCOSE 130* 123* 107* 94 186*  BUN 34* 37* 35* 33* 33*  CREATININE 1.18 1.14 1.13 1.13 1.15  CALCIUM 8.9 8.6* 8.7* 8.7* 8.7*  MG  --   --  2.1 1.9 2.0   Liver Function Tests: No results for input(s): AST, ALT, ALKPHOS, BILITOT, PROT, ALBUMIN in the last 168 hours. No results for input(s): LIPASE, AMYLASE in the last 168 hours. No results for input(s): AMMONIA in the last 168 hours. CBC: Recent Labs  Lab 08/23/17 0344 08/25/17 0254 08/26/17 0411 08/27/17 0719 08/28/17 0204  WBC 9.2 11.5*  14.0* 13.7* 12.3*  HGB 15.8 17.1* 16.9 17.3* 15.7  HCT 47.4 49.9 51.4 52.2* 48.8  MCV 97.9 97.8 98.7 99.8 98.2  PLT 247 268 290 289 299   Cardiac Enzymes: No results for input(s): CKTOTAL, CKMB, CKMBINDEX, TROPONINI in the last 168 hours. BNP: BNP (last 3 results) No results for input(s): BNP in the last 8760 hours.  ProBNP (last 3 results) Recent Labs    06/18/17 0946  PROBNP 476    CBG: Recent Labs  Lab 08/27/17 0756 08/27/17 1134 08/27/17 1635 08/27/17 2201 08/28/17 0737  GLUCAP 82 117* 223* 149* 81       Signed:  Dia Crawford, MD [facility] [phone] pager

## 2017-08-28 NOTE — Progress Notes (Signed)
Spoke w patient, he states all DME including O2 is set up at home. Soke w Lowella Dandy RN, instructed forms for [facility] are in chart, provided with [phone] to call to set up transport home. Lowella Dandy to inform wife of expected time of transport.

## 2017-09-03 ENCOUNTER — Inpatient Hospital Stay: Payer: Medicare Other | Admitting: Adult Health

## 2017-09-21 ENCOUNTER — Ambulatory Visit: Payer: Medicare Other | Admitting: Physician Assistant

## 2017-09-21 ENCOUNTER — Encounter

## 2017-09-23 ENCOUNTER — Inpatient Hospital Stay: Payer: Medicare Other | Admitting: Adult Health

## 2017-09-28 ENCOUNTER — Ambulatory Visit: Payer: Medicare Other | Admitting: Internal Medicine

## 2018-03-25 DEATH — deceased

## 2019-06-15 IMAGING — DX DG CHEST 1V PORT
1 series · 1 of 1 positions shown · non-contrast
Comparison: 04/05/2017.

CLINICAL DATA: Shortness of breath

EXAM:
PORTABLE CHEST 1 VIEW

[chest ap]
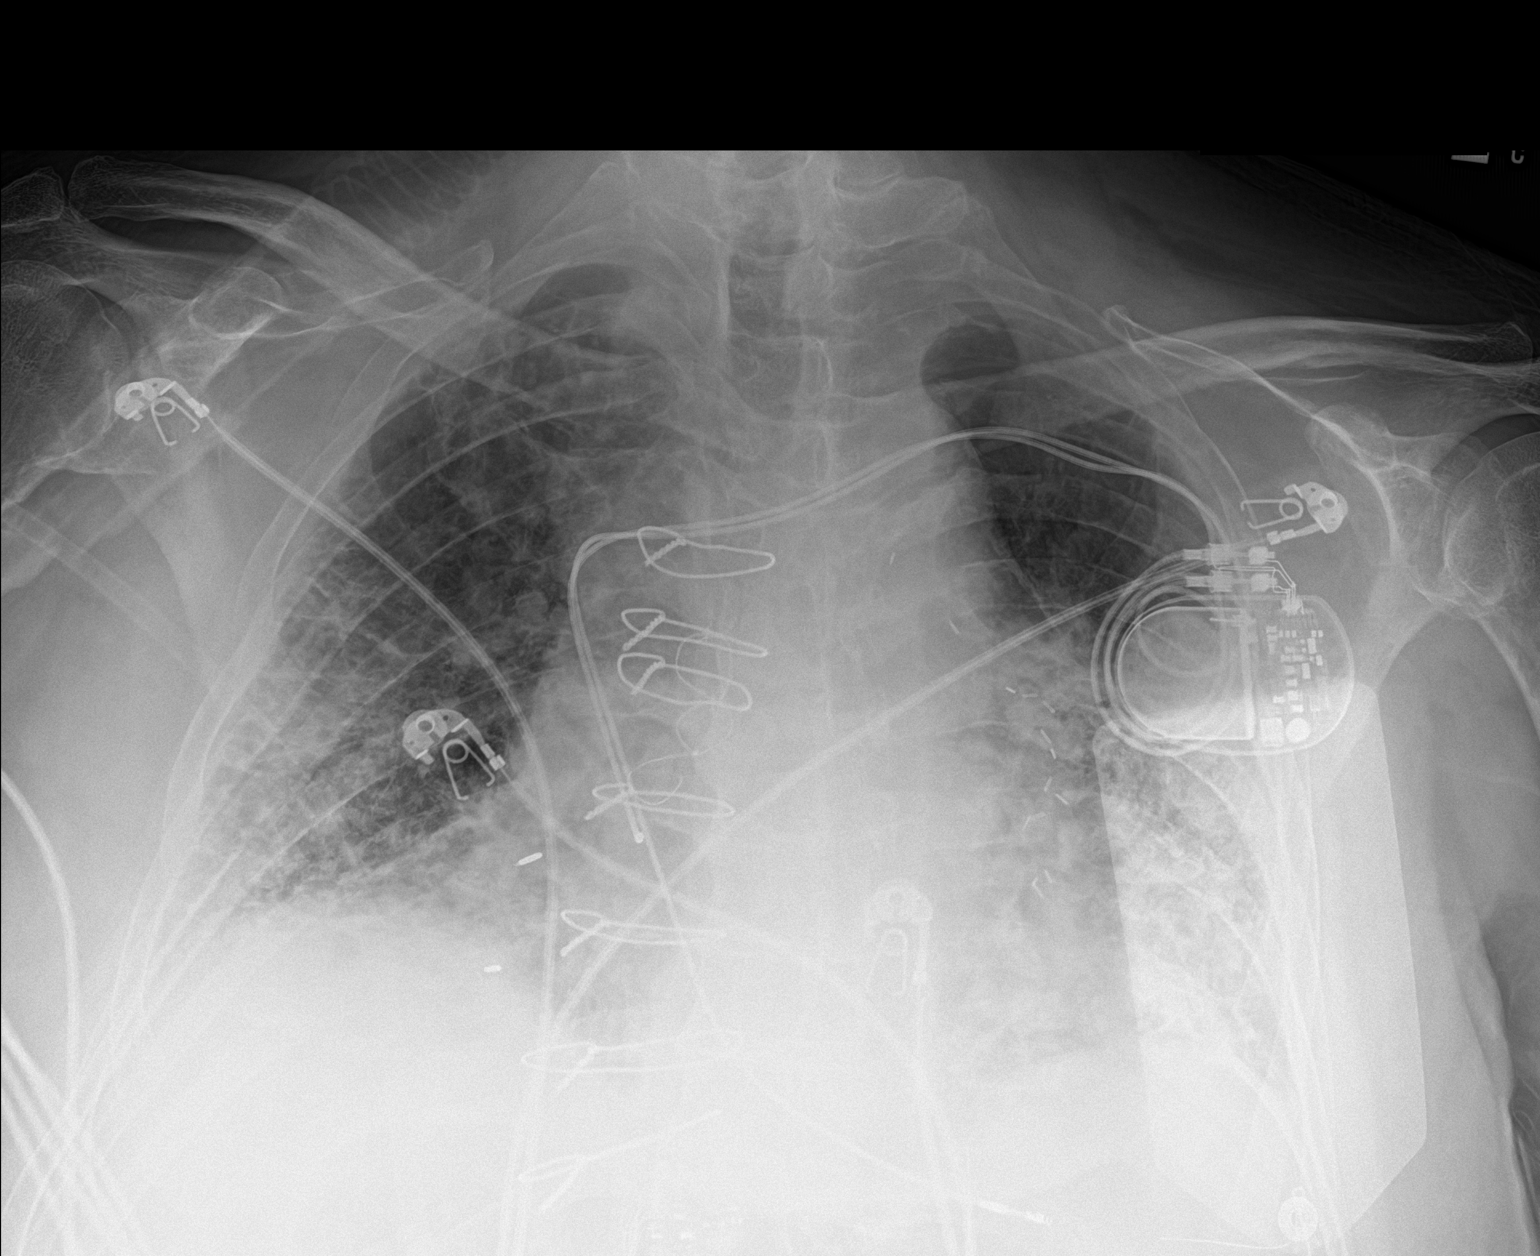

[1 of 1 positions shown; findings below may reference images not displayed]

FINDINGS: Low lung volumes. The cardio pericardial silhouette is enlarged.
There is pulmonary vascular congestion without overt pulmonary
edema. Underlying chronic interstitial changes again noted with
bibasilar atelectasis/infiltrate. Left-sided permanent pacemaker
again noted. Telemetry leads and defibrillator pads overlie the
chest. Bones are demineralized.
IMPRESSION: Cardiomegaly with low lung volumes and bibasilar
atelectasis/infiltrate.

Vascular congestion.  Component of interstitial edema not excluded.

Underlying chronic interstitial lung disease.

## 2019-06-16 IMAGING — CT CT CHEST W/O CM
2 of 4 series · 15 of 36 positions shown, 18 images · non-contrast
Comparison: One-view chest x-ray 08/14/2017. CT of the chest
04/05/2017.

CLINICAL DATA: Shortness of breath. Status post SP RT for non-small
cell lung cancer. Idiopathic pulmonary fibrosis.

EXAM:
CT CHEST WITHOUT CONTRAST
TECHNIQUE: Multidetector CT imaging of the chest was performed following the
standard protocol without IV contrast.

[Series 3: chest wo · axial · 0.75mm/px · z∈[-547,-263]mm · 12 of 168 slices shown, 15 images]
[im 13/168  mediastinal]
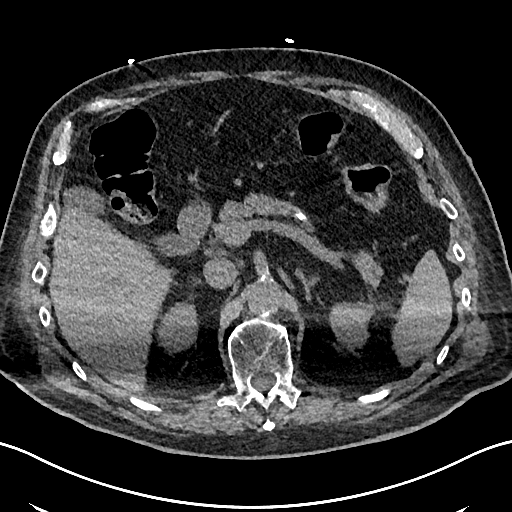
[im 13/168  lung]
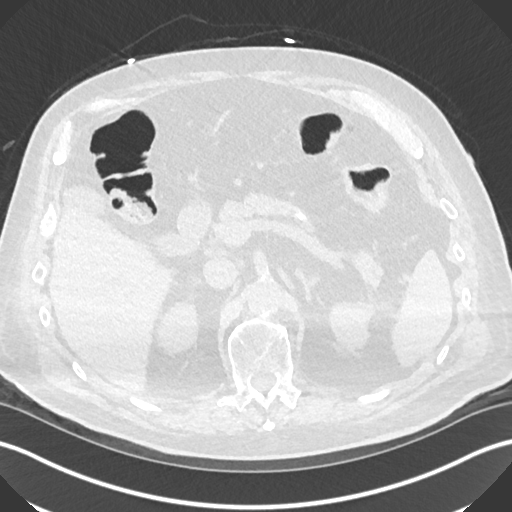
[im 26/168  lung]
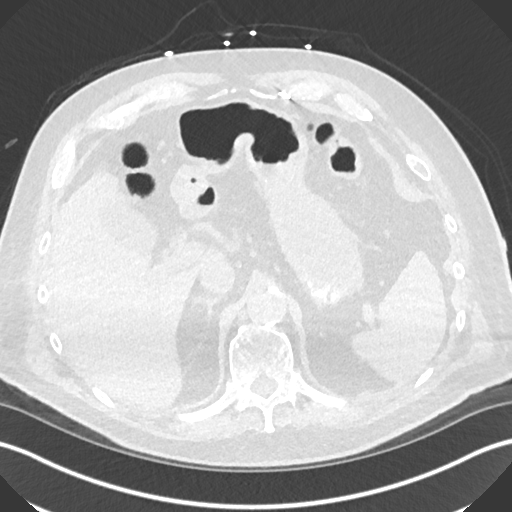
[im 39/168  lung]
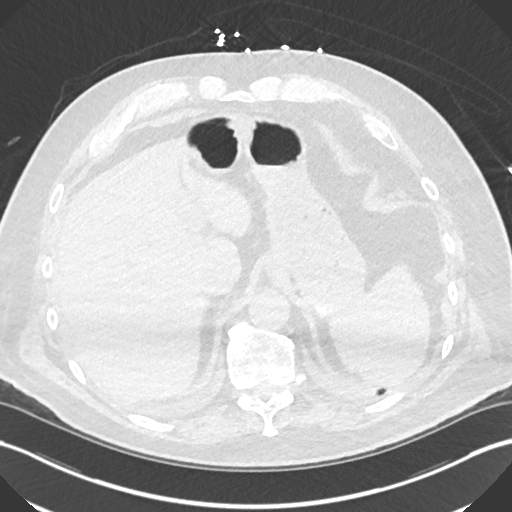
[im 52/168  lung]
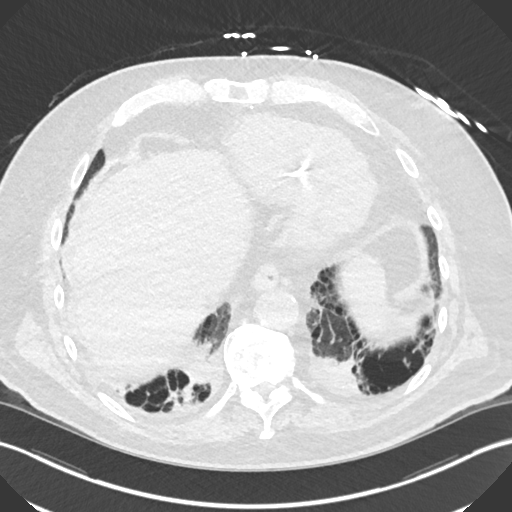
[im 65/168  mediastinal]
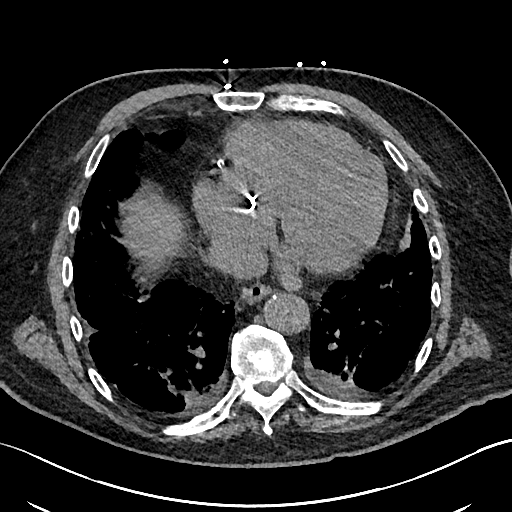
[im 65/168  lung]
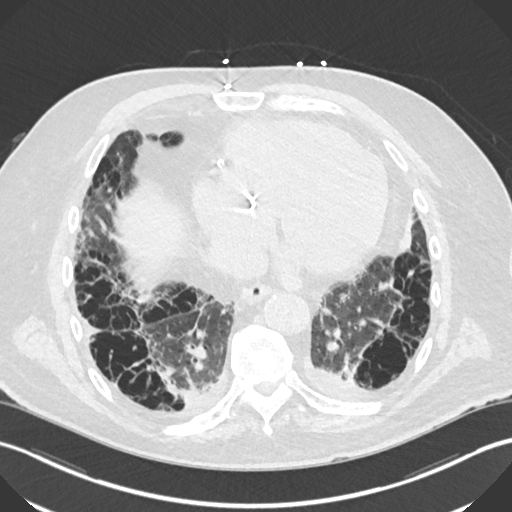
[im 78/168  lung]
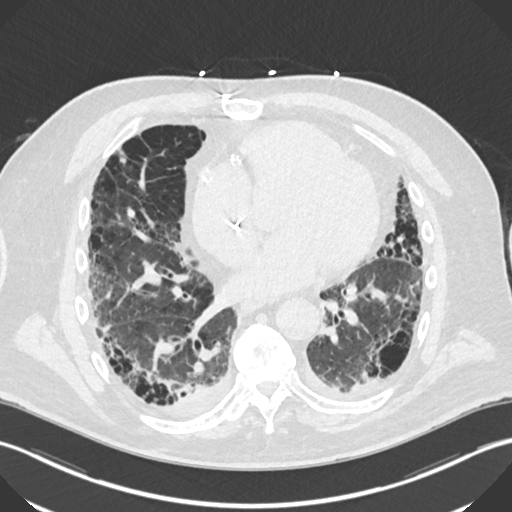
[im 90/168  lung]
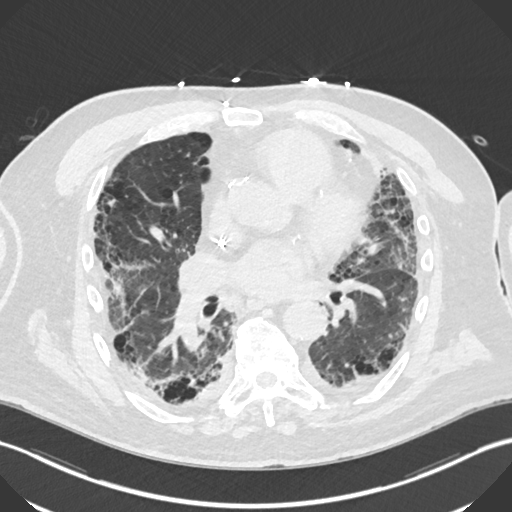
[im 103/168  lung]
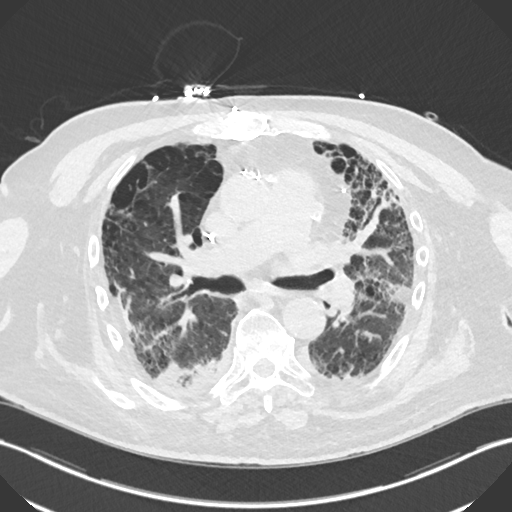
[im 116/168  mediastinal]
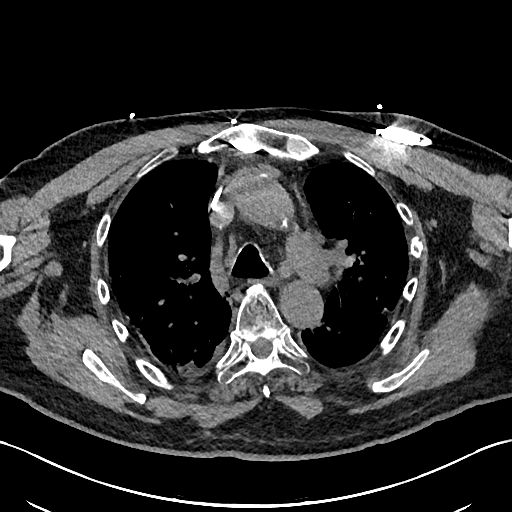
[im 116/168  lung]
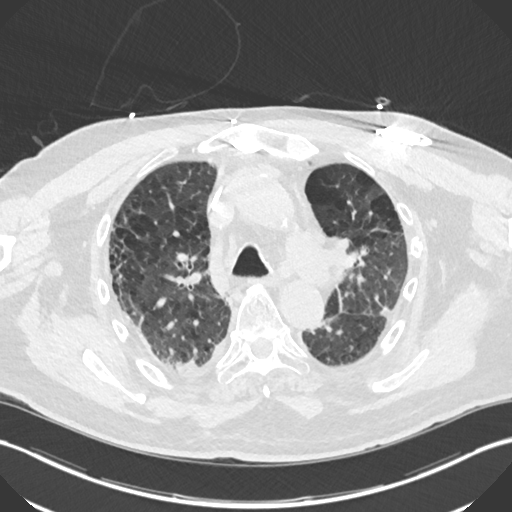
[im 129/168  lung]
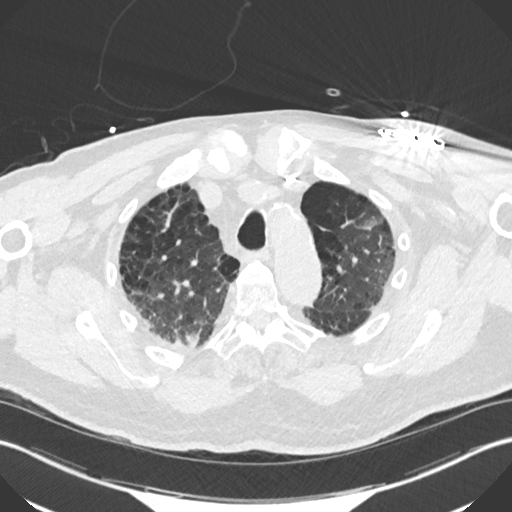
[im 142/168  lung]
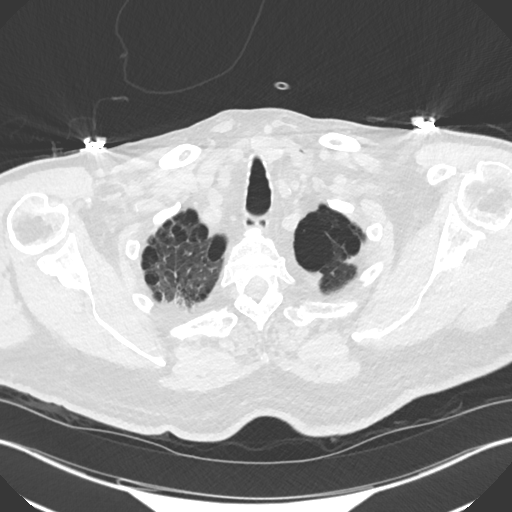
[im 155/168  lung]
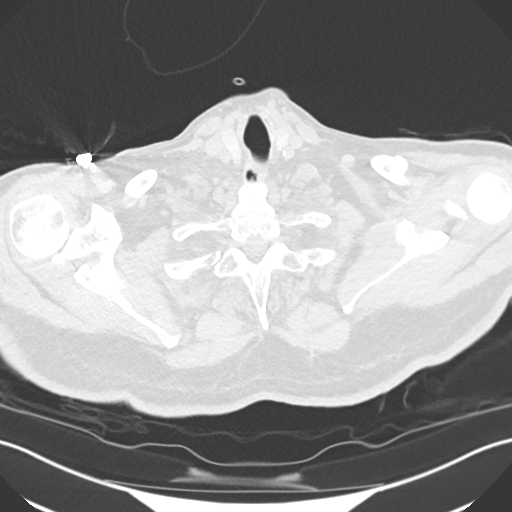

[Series 6: cor · coronal · 0.69mm/px · 3 of 139 slices shown]
[im 28/139  lung]
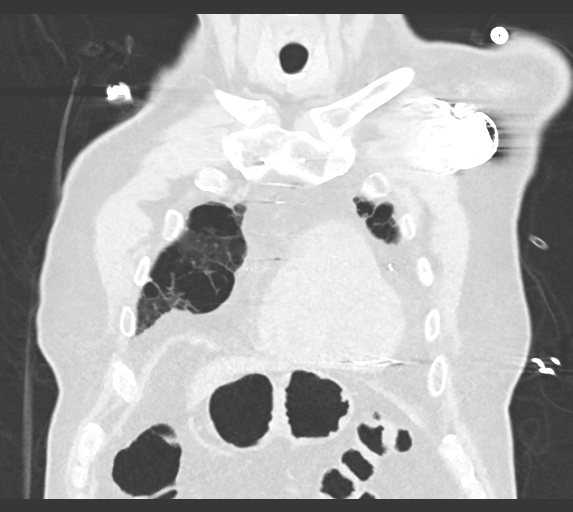
[im 56/139  lung]
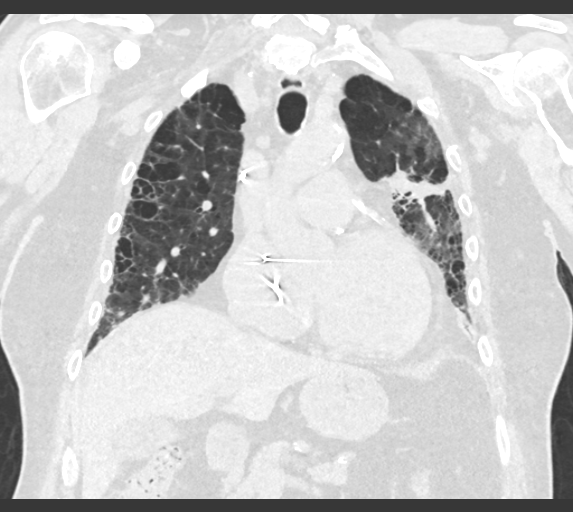
[im 83/139  lung]
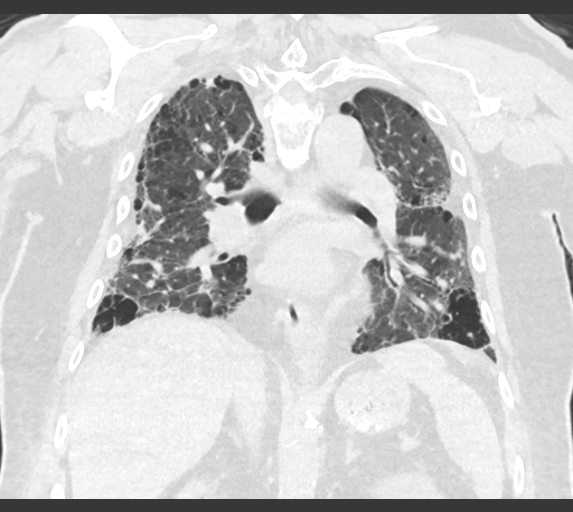

[15 of 36 positions shown; findings below may reference images not displayed]

FINDINGS: Cardiovascular: Pacing wires are in place. The patient is status
post median sternotomy for CABG. Atherosclerotic calcifications are
present in the aorta without aneurysm. Pulmonary arteries are within
normal limits.

Mediastinum/Nodes: No significant mediastinal, axillary, or hilar
adenopathy is present. The thoracic inlet is within normal limits.
The esophagus is unremarkable.

Lungs/Pleura: Post treatment changes of the left upper lobe are
again seen. There is increase consolidation since the prior exam.
Centrilobular emphysematous changes are present. Extensive
peripheral fibrosis is noted. The 5 mm right middle lobe nodule is
stable. Increased airspace opacities are present at the medial lower
lobes bilaterally. This likely reflects atelectasis or infection.

Upper Abdomen: Atherosclerotic calcifications are present in the
aorta and branch vessels without aneurysm. Limited imaging of the
solid organs is within normal limits.

Musculoskeletal: Exaggerated thoracic kyphosis is stable. No focal
lytic or blastic lesions are present.
IMPRESSION: 1. Post treatment changes of the left upper lobe with increased
consolidation.
2. Stable 5 mm nodule in the right middle lobe.
3. Increased airspace opacities in the medial lower lobes
bilaterally. This is likely related to atelectasis or infection.
Metastatic disease is considered unlikely. Recommend short-term
follow-up CT chest without contrast at 2-3 months for further
evaluation.
4.  Aortic Atherosclerosis ([id number].C).
5.  Emphysema ([id number].U).

## 2019-06-16 IMAGING — CR DG ANKLE COMPLETE 3+V*R*
3 series · 3 of 3 positions shown · non-contrast
Comparison: None.

CLINICAL DATA: Ankle fracture.

EXAM:
RIGHT ANKLE - COMPLETE 3+ VIEW

[ankle ap]
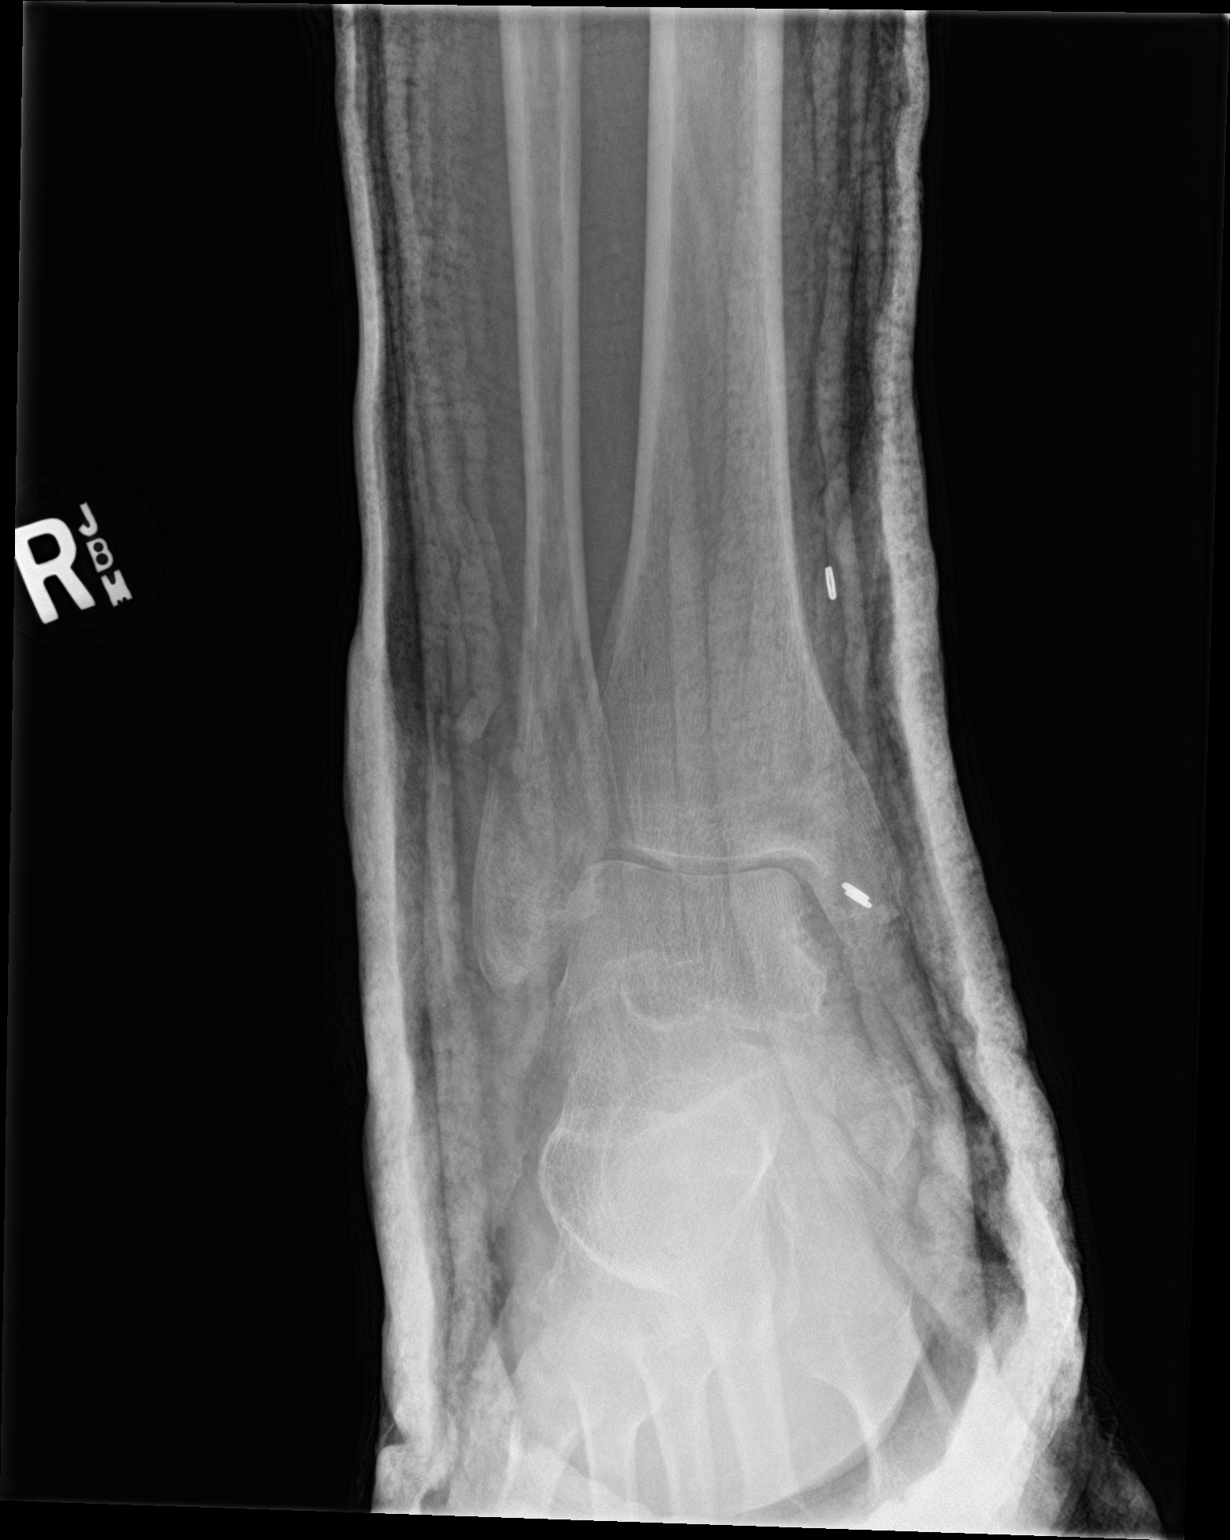

[ankle obl]
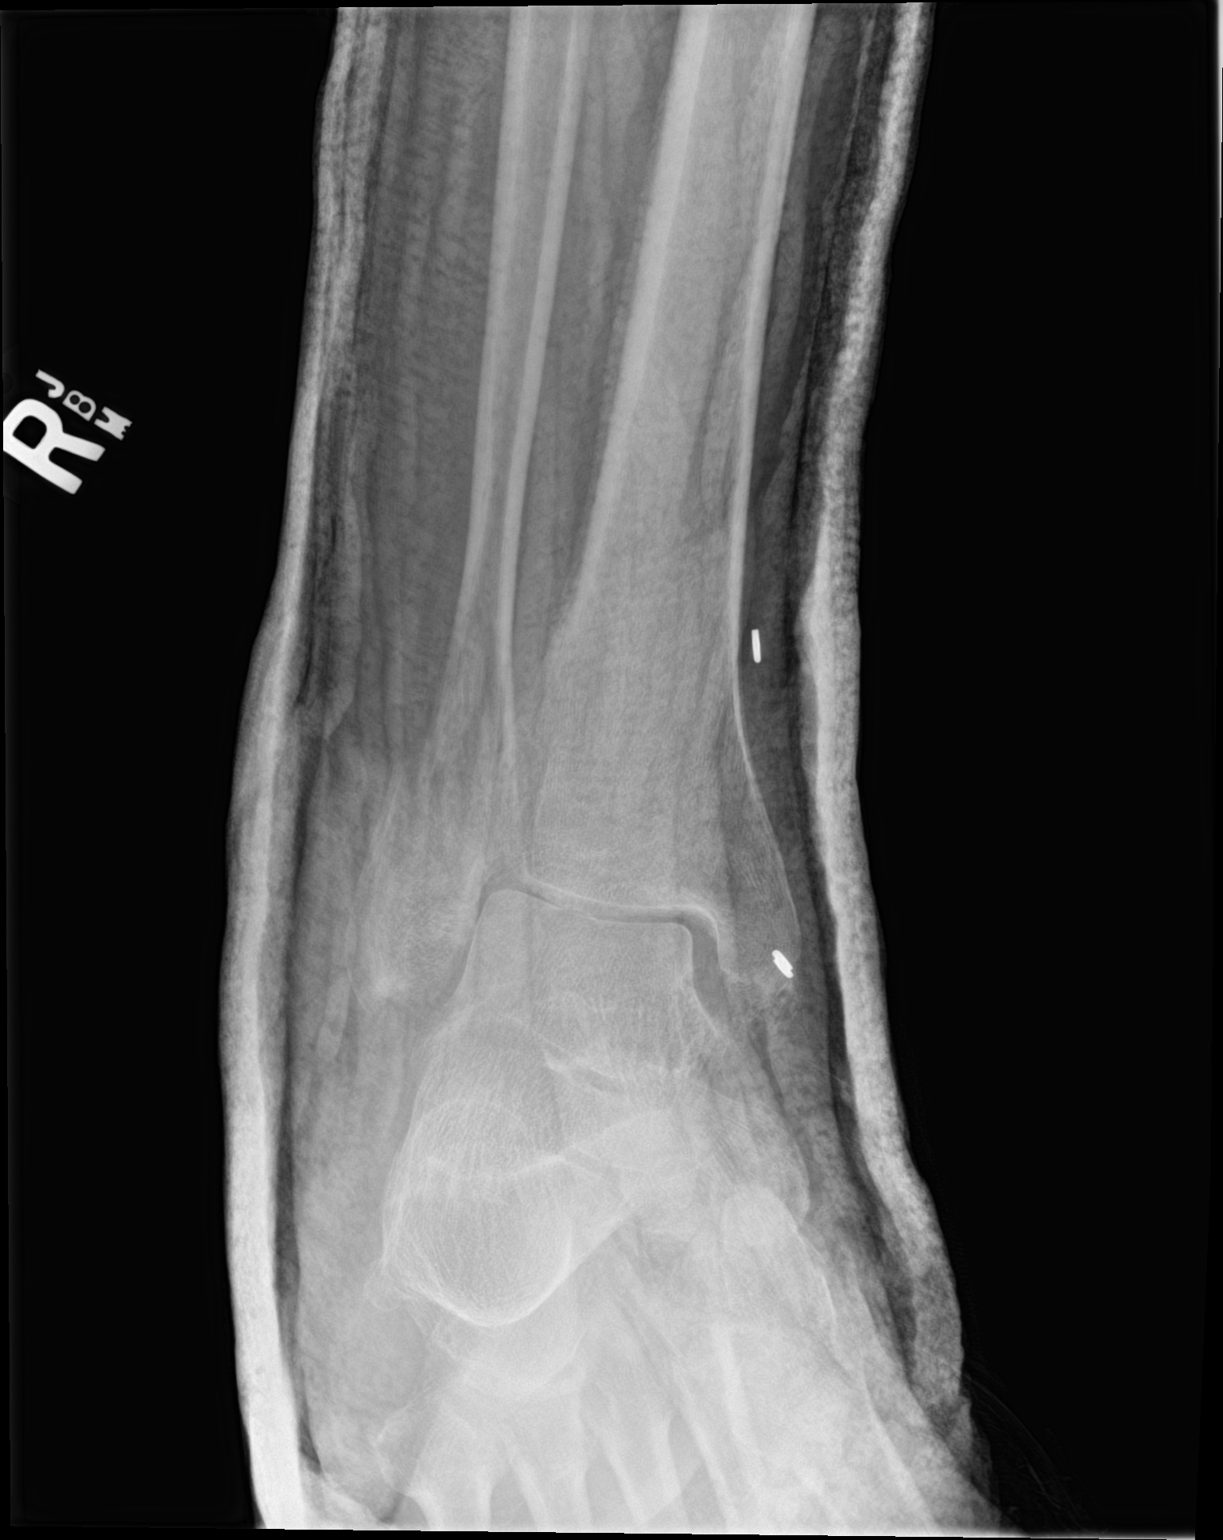

[ankle lat]
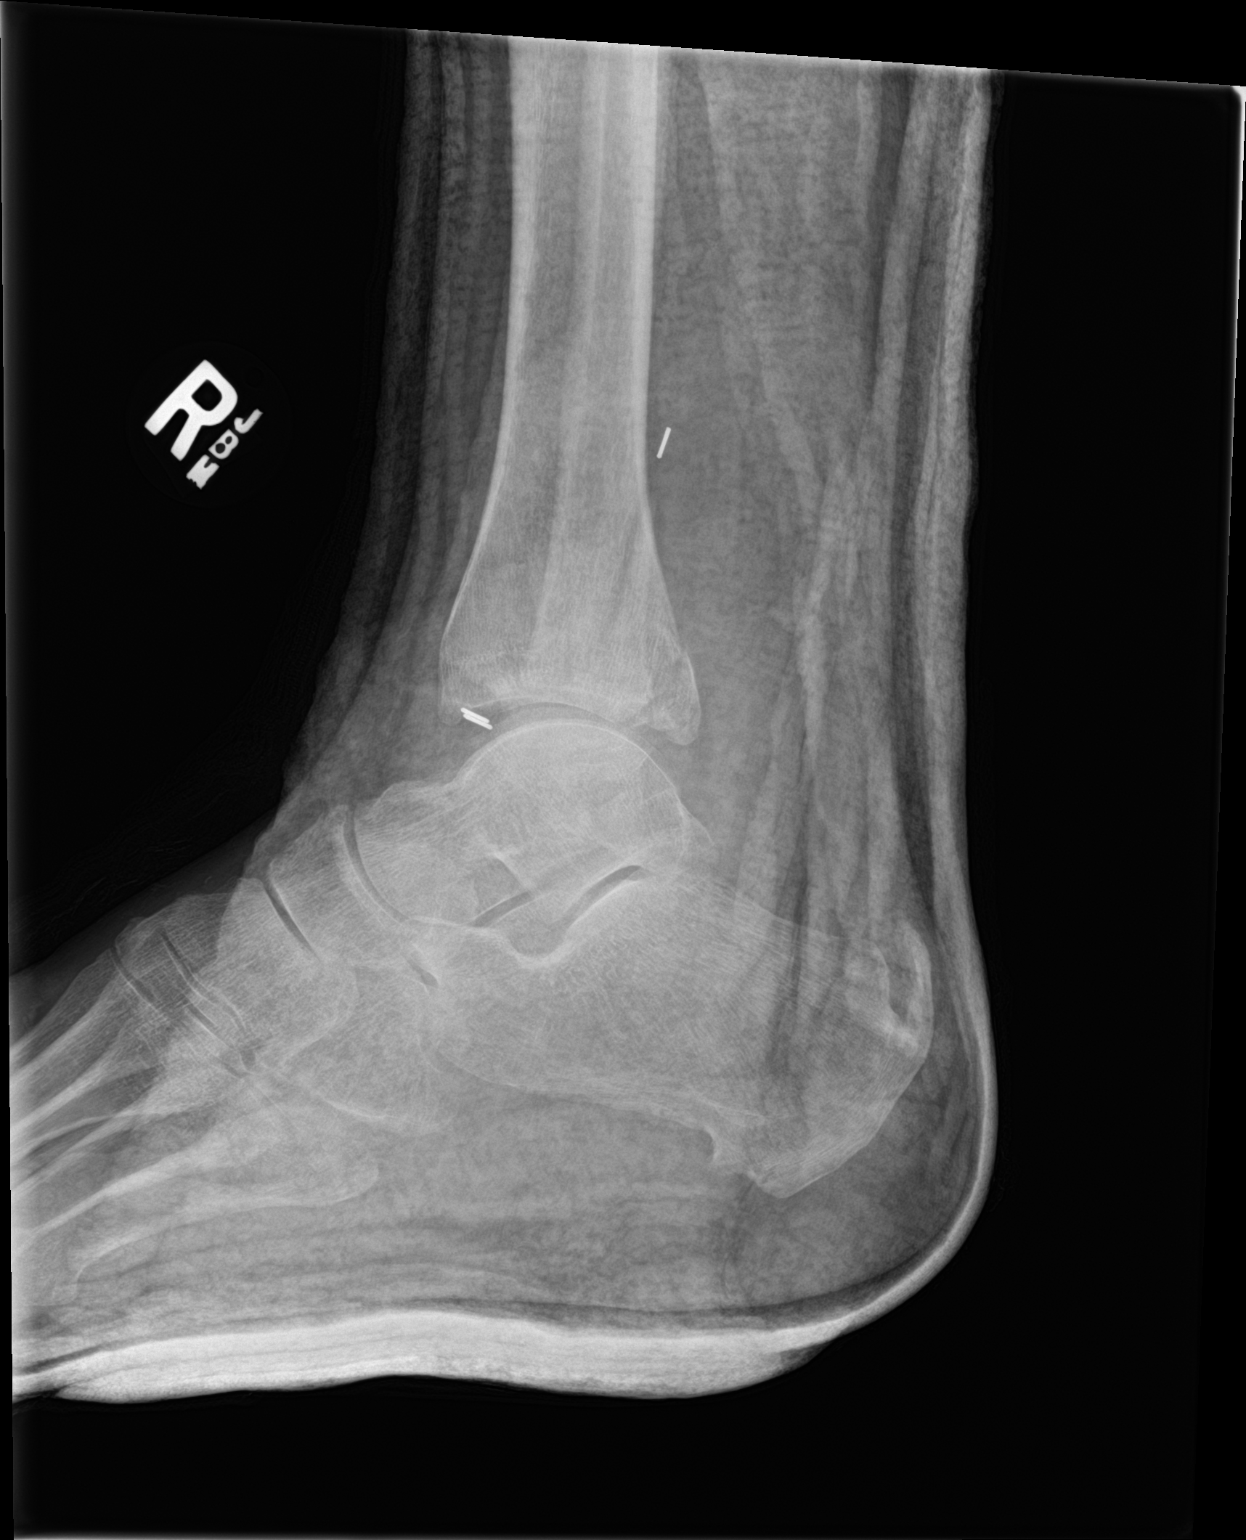

[3 of 3 positions shown; findings below may reference images not displayed]

FINDINGS: Three views are submitted within a plaster cast. Oblique fracture of
the distal fibula is displaced 4 mm. A posterior tibial fracture is
minimally displaced. The fracture at the medial malleolus is likely
present. There is some widening of the medial tibiotalar distance.
IMPRESSION: Impressed

1. Oblique fracture of the fibula suggesting an eversion injury.
2. Minimally displaced posterior tibial fracture.
3. Probable fracture at the medial malleolus.
4. Widening of the medial tibiotalar space.
